# Patient Record
Sex: Female | Born: 1960 | Race: White | Hispanic: No | Marital: Single | Smoking: Never smoker
Health system: Southern US, Community
[De-identification: ages and names within clinical notes are randomized; demographics above are authoritative.]

## PROBLEM LIST (undated history)

## (undated) DIAGNOSIS — D219 Benign neoplasm of connective and other soft tissue, unspecified: Secondary | ICD-10-CM

## (undated) DIAGNOSIS — E785 Hyperlipidemia, unspecified: Secondary | ICD-10-CM

## (undated) DIAGNOSIS — M7989 Other specified soft tissue disorders: Secondary | ICD-10-CM

## (undated) DIAGNOSIS — F329 Major depressive disorder, single episode, unspecified: Secondary | ICD-10-CM

## (undated) DIAGNOSIS — C569 Malignant neoplasm of unspecified ovary: Secondary | ICD-10-CM

## (undated) DIAGNOSIS — M549 Dorsalgia, unspecified: Secondary | ICD-10-CM

## (undated) DIAGNOSIS — E559 Vitamin D deficiency, unspecified: Secondary | ICD-10-CM

## (undated) DIAGNOSIS — I10 Essential (primary) hypertension: Secondary | ICD-10-CM

## (undated) DIAGNOSIS — K5900 Constipation, unspecified: Secondary | ICD-10-CM

## (undated) DIAGNOSIS — G43909 Migraine, unspecified, not intractable, without status migrainosus: Secondary | ICD-10-CM

## (undated) DIAGNOSIS — Z9889 Other specified postprocedural states: Secondary | ICD-10-CM

## (undated) DIAGNOSIS — R112 Nausea with vomiting, unspecified: Secondary | ICD-10-CM

## (undated) DIAGNOSIS — F419 Anxiety disorder, unspecified: Secondary | ICD-10-CM

## (undated) DIAGNOSIS — R0602 Shortness of breath: Secondary | ICD-10-CM

## (undated) DIAGNOSIS — Z800 Family history of malignant neoplasm of digestive organs: Secondary | ICD-10-CM

## (undated) DIAGNOSIS — M1990 Unspecified osteoarthritis, unspecified site: Secondary | ICD-10-CM

## (undated) DIAGNOSIS — Z8042 Family history of malignant neoplasm of prostate: Secondary | ICD-10-CM

## (undated) DIAGNOSIS — M2550 Pain in unspecified joint: Secondary | ICD-10-CM

## (undated) DIAGNOSIS — D649 Anemia, unspecified: Secondary | ICD-10-CM

## (undated) DIAGNOSIS — R1900 Intra-abdominal and pelvic swelling, mass and lump, unspecified site: Secondary | ICD-10-CM

## (undated) DIAGNOSIS — N63 Unspecified lump in unspecified breast: Secondary | ICD-10-CM

## (undated) DIAGNOSIS — F32A Depression, unspecified: Secondary | ICD-10-CM

## (undated) DIAGNOSIS — G4730 Sleep apnea, unspecified: Secondary | ICD-10-CM

## (undated) DIAGNOSIS — T7840XA Allergy, unspecified, initial encounter: Secondary | ICD-10-CM

## (undated) HISTORY — PX: BREAST EXCISIONAL BIOPSY: SUR124

## (undated) HISTORY — DX: Shortness of breath: R06.02

## (undated) HISTORY — DX: Hyperlipidemia, unspecified: E78.5

## (undated) HISTORY — PX: WISDOM TOOTH EXTRACTION: SHX21

## (undated) HISTORY — DX: Other specified soft tissue disorders: M79.89

## (undated) HISTORY — PX: BREAST REDUCTION SURGERY: SHX8

## (undated) HISTORY — DX: Constipation, unspecified: K59.00

## (undated) HISTORY — DX: Allergy, unspecified, initial encounter: T78.40XA

## (undated) HISTORY — PX: REDUCTION MAMMAPLASTY: SUR839

## (undated) HISTORY — DX: Dorsalgia, unspecified: M54.9

## (undated) HISTORY — DX: Malignant neoplasm of unspecified ovary: C56.9

## (undated) HISTORY — DX: Essential (primary) hypertension: I10

## (undated) HISTORY — PX: BREAST LUMPECTOMY: SHX2

## (undated) HISTORY — DX: Family history of malignant neoplasm of prostate: Z80.42

## (undated) HISTORY — DX: Benign neoplasm of connective and other soft tissue, unspecified: D21.9

## (undated) HISTORY — DX: Unspecified osteoarthritis, unspecified site: M19.90

## (undated) HISTORY — DX: Anxiety disorder, unspecified: F41.9

## (undated) HISTORY — DX: Depression, unspecified: F32.A

## (undated) HISTORY — PX: KNEE SURGERY: SHX244

## (undated) HISTORY — DX: Unspecified lump in unspecified breast: N63.0

## (undated) HISTORY — DX: Vitamin D deficiency, unspecified: E55.9

## (undated) HISTORY — DX: Family history of malignant neoplasm of digestive organs: Z80.0

## (undated) HISTORY — DX: Migraine, unspecified, not intractable, without status migrainosus: G43.909

## (undated) HISTORY — DX: Intra-abdominal and pelvic swelling, mass and lump, unspecified site: R19.00

## (undated) HISTORY — DX: Pain in unspecified joint: M25.50

---
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
## 1999-04-20 ENCOUNTER — Encounter: Payer: Self-pay | Admitting: General Surgery

## 1999-04-20 ENCOUNTER — Ambulatory Visit (HOSPITAL_COMMUNITY): Admission: RE | Admit: 1999-04-20 | Discharge: 1999-04-20 | Payer: Self-pay | Admitting: General Surgery

## 2007-10-27 ENCOUNTER — Encounter: Admission: RE | Admit: 2007-10-27 | Discharge: 2007-10-27 | Payer: Self-pay | Admitting: Obstetrics and Gynecology

## 2007-11-16 ENCOUNTER — Encounter: Admission: RE | Admit: 2007-11-16 | Discharge: 2007-11-16 | Payer: Self-pay | Admitting: Obstetrics and Gynecology

## 2007-11-16 ENCOUNTER — Encounter (INDEPENDENT_AMBULATORY_CARE_PROVIDER_SITE_OTHER): Payer: Self-pay | Admitting: Diagnostic Radiology

## 2007-12-10 HISTORY — PX: ABDOMINAL HYSTERECTOMY: SHX81

## 2008-06-01 ENCOUNTER — Encounter: Admission: RE | Admit: 2008-06-01 | Discharge: 2008-06-01 | Payer: Self-pay | Admitting: Obstetrics and Gynecology

## 2008-11-24 ENCOUNTER — Encounter: Admission: RE | Admit: 2008-11-24 | Discharge: 2008-11-24 | Payer: Self-pay | Admitting: Obstetrics and Gynecology

## 2014-12-16 HISTORY — PX: BREAST BIOPSY: SHX20

## 2015-01-12 ENCOUNTER — Other Ambulatory Visit: Payer: Self-pay

## 2015-01-12 ENCOUNTER — Other Ambulatory Visit: Payer: Self-pay | Admitting: Gynecology

## 2015-01-12 DIAGNOSIS — N63 Unspecified lump in unspecified breast: Secondary | ICD-10-CM

## 2015-01-30 ENCOUNTER — Ambulatory Visit
Admission: RE | Admit: 2015-01-30 | Discharge: 2015-01-30 | Disposition: A | Discharge status: Home / Self Care | Payer: BLUE CROSS/BLUE SHIELD | Source: Ambulatory Visit | Attending: Gynecology | Admitting: Gynecology

## 2015-01-30 ENCOUNTER — Other Ambulatory Visit: Payer: Self-pay | Admitting: Gynecology

## 2015-01-30 DIAGNOSIS — N63 Unspecified lump in unspecified breast: Secondary | ICD-10-CM

## 2015-01-30 DIAGNOSIS — N6310 Unspecified lump in the right breast, unspecified quadrant: Secondary | ICD-10-CM

## 2015-02-02 ENCOUNTER — Other Ambulatory Visit: Payer: Self-pay | Admitting: Gynecology

## 2015-02-02 DIAGNOSIS — N6310 Unspecified lump in the right breast, unspecified quadrant: Secondary | ICD-10-CM

## 2015-02-03 ENCOUNTER — Ambulatory Visit
Admission: RE | Admit: 2015-02-03 | Discharge: 2015-02-03 | Disposition: A | Discharge status: Home / Self Care | Payer: BLUE CROSS/BLUE SHIELD | Source: Ambulatory Visit | Attending: Gynecology | Admitting: Gynecology

## 2015-02-03 DIAGNOSIS — N6310 Unspecified lump in the right breast, unspecified quadrant: Secondary | ICD-10-CM

## 2015-03-31 ENCOUNTER — Encounter: Payer: Self-pay | Admitting: Gynecology

## 2016-08-26 ENCOUNTER — Other Ambulatory Visit: Payer: Self-pay | Admitting: Internal Medicine

## 2016-08-26 DIAGNOSIS — Z1231 Encounter for screening mammogram for malignant neoplasm of breast: Secondary | ICD-10-CM

## 2016-10-01 ENCOUNTER — Encounter: Payer: Self-pay | Admitting: Internal Medicine

## 2016-12-13 ENCOUNTER — Ambulatory Visit
Admission: RE | Admit: 2016-12-13 | Discharge: 2016-12-13 | Disposition: A | Discharge status: Home / Self Care | Payer: BLUE CROSS/BLUE SHIELD | Source: Ambulatory Visit | Attending: Internal Medicine | Admitting: Internal Medicine

## 2016-12-13 DIAGNOSIS — Z1231 Encounter for screening mammogram for malignant neoplasm of breast: Secondary | ICD-10-CM

## 2017-08-20 ENCOUNTER — Telehealth: Payer: Self-pay | Admitting: Gynecologic Oncology

## 2017-08-20 ENCOUNTER — Encounter: Payer: Self-pay | Admitting: Gynecologic Oncology

## 2017-08-20 NOTE — Telephone Encounter (Signed)
Received a call from [facility] to schedule the pt an appt to see Dr. Alycia Rossetti on 9/19 at [facility] will notify the pt. Letter mailed.

## 2017-08-27 ENCOUNTER — Encounter: Payer: Self-pay | Admitting: Gynecologic Oncology

## 2017-08-27 ENCOUNTER — Ambulatory Visit (HOSPITAL_BASED_OUTPATIENT_CLINIC_OR_DEPARTMENT_OTHER): Payer: BLUE CROSS/BLUE SHIELD

## 2017-08-27 ENCOUNTER — Other Ambulatory Visit: Payer: Self-pay | Admitting: Gynecologic Oncology

## 2017-08-27 ENCOUNTER — Ambulatory Visit: Payer: BLUE CROSS/BLUE SHIELD | Attending: Gynecologic Oncology | Admitting: Gynecologic Oncology

## 2017-08-27 VITALS — BP 139/88 | HR 98 | Temp 98.4°F | Resp 20 | Ht 64.0 in | Wt 187.3 lb

## 2017-08-27 DIAGNOSIS — Z800 Family history of malignant neoplasm of digestive organs: Secondary | ICD-10-CM | POA: Diagnosis not present

## 2017-08-27 DIAGNOSIS — Z8249 Family history of ischemic heart disease and other diseases of the circulatory system: Secondary | ICD-10-CM | POA: Diagnosis not present

## 2017-08-27 DIAGNOSIS — Z9889 Other specified postprocedural states: Secondary | ICD-10-CM | POA: Insufficient documentation

## 2017-08-27 DIAGNOSIS — R1900 Intra-abdominal and pelvic swelling, mass and lump, unspecified site: Secondary | ICD-10-CM

## 2017-08-27 DIAGNOSIS — Z833 Family history of diabetes mellitus: Secondary | ICD-10-CM | POA: Insufficient documentation

## 2017-08-27 DIAGNOSIS — Z79899 Other long term (current) drug therapy: Secondary | ICD-10-CM | POA: Insufficient documentation

## 2017-08-27 DIAGNOSIS — I10 Essential (primary) hypertension: Secondary | ICD-10-CM | POA: Insufficient documentation

## 2017-08-27 DIAGNOSIS — Z90710 Acquired absence of both cervix and uterus: Secondary | ICD-10-CM | POA: Insufficient documentation

## 2017-08-27 DIAGNOSIS — Z803 Family history of malignant neoplasm of breast: Secondary | ICD-10-CM | POA: Insufficient documentation

## 2017-08-27 DIAGNOSIS — N839 Noninflammatory disorder of ovary, fallopian tube and broad ligament, unspecified: Secondary | ICD-10-CM | POA: Diagnosis not present

## 2017-08-27 LAB — CBC WITH DIFFERENTIAL/PLATELET
BASO%: 0.8 % (ref 0.0–2.0)
Basophils Absolute: 0.1 10*3/uL (ref 0.0–0.1)
EOS%: 2.4 % (ref 0.0–7.0)
Eosinophils Absolute: 0.3 10*3/uL (ref 0.0–0.5)
HEMATOCRIT: 40.4 % (ref 34.8–46.6)
HEMOGLOBIN: 13.2 g/dL (ref 11.6–15.9)
LYMPH#: 1.8 10*3/uL (ref 0.9–3.3)
LYMPH%: 15.2 % (ref 14.0–49.7)
MCH: 27.2 pg (ref 25.1–34.0)
MCHC: 32.7 g/dL (ref 31.5–36.0)
MCV: 83.1 fL (ref 79.5–101.0)
MONO#: 0.8 10*3/uL (ref 0.1–0.9)
MONO%: 7 % (ref 0.0–14.0)
NEUT%: 74.6 % (ref 38.4–76.8)
NEUTROS ABS: 8.9 10*3/uL — AB (ref 1.5–6.5)
Platelets: 637 10*3/uL — ABNORMAL HIGH (ref 145–400)
RBC: 4.86 10*6/uL (ref 3.70–5.45)
RDW: 15.4 % — AB (ref 11.2–14.5)
WBC: 11.9 10*3/uL — AB (ref 3.9–10.3)

## 2017-08-27 LAB — COMPREHENSIVE METABOLIC PANEL
ALBUMIN: 4.2 g/dL (ref 3.5–5.0)
ALT: 8 U/L (ref 0–55)
AST: 15 U/L (ref 5–34)
Alkaline Phosphatase: 64 U/L (ref 40–150)
Anion Gap: 12 mEq/L — ABNORMAL HIGH (ref 3–11)
BUN: 10.8 mg/dL (ref 7.0–26.0)
CALCIUM: 11.3 mg/dL — AB (ref 8.4–10.4)
CHLORIDE: 98 meq/L (ref 98–109)
CO2: 29 mEq/L (ref 22–29)
Creatinine: 0.9 mg/dL (ref 0.6–1.1)
EGFR: 73 mL/min/{1.73_m2} — AB (ref >90)
Glucose: 109 mg/dl (ref 70–140)
POTASSIUM: 3.5 meq/L (ref 3.5–5.1)
SODIUM: 139 meq/L (ref 136–145)
Total Bilirubin: 0.36 mg/dL (ref 0.20–1.20)
Total Protein: 8.8 g/dL — ABNORMAL HIGH (ref 6.4–8.3)

## 2017-08-27 LAB — CEA (IN HOUSE-CHCC)

## 2017-08-27 NOTE — Progress Notes (Signed)
Consult Note: Gyn-Onc  Sierra Gray 56 y.o. female  CC:  Chief Complaint  Patient presents with  . Pelvic mass R19.00]    New patient    HPI: Patient is seen today in consultation at the request of Dr. Nicoletta Dress.  Patient is a very pleasant 56 year old gravida 1 para 0 who in late August after having her [facility] visit set up from the dental care and felt some abdominal discomfort. The pain increased and she subsequently that evening had a temperature of 102. This was corresponding with increased abdominal pain and then the abdominal pain dissipated. Since then she's had a occasional pain and last Monday. Due to the persistent discomfort she saw her primary care physician. She describes the pain as a occurring in both her right and left lower quadrants. She did not notice any upper abdominal discomfort. There's been no change in her bowel or bladder habits. She does endorse approximately a 20-25 pound weight loss in the last 6-9 months. Much of this has been intentional. An Thanksgiving of last year to the Christmas holiday she lost 10 pounds which was not intentional but since then her weight loss has been intentional and she feels that she's eating well.  She underwent a CT scan on 08/19/2017. It revealed a trace amount of ascites in the peritoneal cavity seen prior dominant weight in the lateral paracolic gutter and along the liver capsule. There was no nodule letter peritoneal enhancement identified to suggest peritoneal carcinomatosis. There really does state that there is a mass that is cystic and solid that replaces the entire uterus. It extends caudally to the cervix and appears to invade the upper one third of the vagina. The caudal margin of the neoplasm made directly invading the posterior bladder dome to the medial thickness is normal. No fat separate the mass from the adjacent upper third of the rectum or from the mid sigmoid. The mass measures 12 x 13 x 20 cm in size and posteriorly  and laterally displaces the right ovary. The left ovary was not competent to be identified. Of note the patient had undergone a laparoscopic hysterectomy at [facility] approximate 14 years ago secondary to fibroids.  She has not yet had tumor markers drawn. She states she's never had a colonoscopy. She's not sure if her last mammogram was in 2017 but she thinks she might be due this year. She does have family history of cancer her father died of pancreatic cancer in his late 22s. His maternal aunt with breast cancer in her 61s. A paternal grandfather died of colon cancer at 55. She does endorse allergies to chlorhexidine which causes a rash. This was an issue when she had knee surgery on the right. She believes Betadine is okay.  Review of Systems: Constitutional:  Denies fever. + weight loss as above Skin: No rash, + skin tag on right inner leg she wants removed Cardiovascular: No chest pain, shortness of breath, or edema  Pulmonary: No cough or wheeze.  Gastro Intestinal: Reporting intermittent lower abdominal soreness.  No nausea, vomiting, constipation, or diarrhea reported. No bright red blood per rectum or change in bowel movement.  Genitourinary: Denies vaginal bleeding and discharge.  Musculoskeletal: + right joint pain.  Psychology: No complaints  Current Meds:  Outpatient Encounter Prescriptions as of 08/27/2017  Medication Sig  . amLODipine (NORVASC) 5 MG tablet amlodipine 5 mg tablet  TAKE 1 TAB BY MOUTH ONCE DAILY  . Apple Cider Vinegar 500 MG TABS Take 500 mg by  mouth 2 (two) times daily.  . Ascorbic Acid (VITAMIN C) 1000 MG tablet Take 1,000 mg by mouth daily.  . chlorthalidone (HYGROTON) 25 MG tablet   . loratadine (CLARITIN) 10 MG tablet Take 10 mg by mouth daily.  . Melatonin 1 MG CAPS Take 1 mg by mouth at bedtime.  . Methylsulfonylmethane (MSM) 1000 MG CAPS Take 1,000 mg by mouth daily.  . Turmeric 500 MG CAPS Take 500 mg by mouth daily.   No facility-administered encounter  medications on file as of 08/27/2017.     Allergy:  Allergies  Allergen Reactions  . Chlorhexidine Rash  . Bacitracin Rash  . Iodine Rash    Social Hx:   Social History   Social History  . Marital status: Single    Spouse name: N/A  . Number of children: N/A  . Years of education: N/A   Occupational History  . Not on file.   Social History Main Topics  . Smoking status: Never Smoker  . Smokeless tobacco: Never Used  . Alcohol use Yes     Comment: Socially drinks wine/beer  . Drug use: No  . Sexual activity: No   Other Topics Concern  . Not on file   Social History Narrative  . No narrative on file    Past Surgical Hx:  Past Surgical History:  Procedure Laterality Date  . ABDOMINAL HYSTERECTOMY  2009   Total laparoscopic hyst for fibroids  . BREAST BIOPSY  12/16/2014   2 lumps in left breast removed  . KNEE SURGERY     x3  . REDUCTION MAMMAPLASTY      Past Medical Hx:  Past Medical History:  Diagnosis Date  . Breast lump in female   . Fibroids   . Hyperlipidemia   . Hypertension   . Migraine   . Pelvic mass in female     Oncology Hx:   No history exists.    Family Hx:  Family History  Problem Relation Age of Onset  . Hypertension Mother   . Hyperlipidemia Mother   . Pancreatic cancer Father   . Hypertension Father   . Cancer Father 42       pancreatic  . Breast cancer Maternal Aunt   . Heart disease Maternal Aunt   . Cancer Maternal Aunt 79       breast  . Hyperlipidemia Maternal Uncle   . Diabetes Maternal Uncle   . Stroke Maternal Uncle   . Osteoporosis Maternal Uncle   . Heart attack Maternal Grandmother   . Diabetes Maternal Grandfather   . Dementia Maternal Grandfather   . Osteoporosis Paternal Grandmother   . Cancer Paternal Grandmother   . Colon cancer Paternal Grandfather     Vitals:  Blood pressure 139/88, pulse 98, temperature 98.4 F (36.9 C), temperature source Oral, resp. rate 20, height 5\' 4"  (1.626 m), weight 187  lb 4.8 oz (85 kg), SpO2 100 %.  Physical Exam: Well-nourished well-developed female in no acute distress.  Neck: Supple, no lymphadenopathy, no thyromegaly.  Lungs: Clear to auscultation bilaterally.  Cardiac: Regular rate and rhythm.  Abdomen: Obese, soft, nontender, nondistended. There is a palpable mass appreciated just under the umbilicus. There is no fluid wave. The mass is not tender. The patient was able to feel the mass when we identified the edge of the mass for her.  Groins: No lymphadenopathy.  Extremities no edema.  Pelvic: External genitalia is within normal limits. There is approximately an 8 mm skin tag coming  off the interval right thigh. Bimanual examination reveals a large abdominal pelvic mass measuring approximately 16-18 cm. It is nontender. It is somewhat mobile with the vaginal hand. It is not ballotable. On rectovaginal examination there is no nodularity.  Assessment/Plan: 56 year old with complex adnexal mass. We will resubmit her CT scan for reread here. 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   I cannot clearly identify which ovary is coming from but we discussed that we would proceed with exploratory laparotomy and removal of bilateral adnexa. The affected adnexa will be sent for frozen section. If this is a malignancy, we would proceed with appropriate staging to include but not limited to lymphadenectomy, peritoneal biopsies, omentectomy, and debulking surgery. Risks and benefits of surgery including but not limited to bleeding, infection, injury to surrounding organs, possible bowel surgery were discussed with the patient. We discussed the risk of DVT PE and that she most likely will be going home with Lovenox injections for 28 days. She states that she develops a rash to chlorhexidine therefore we will not use  that. She states that she believes Betadine is okay. Therefore, that will be the prep of choice for her procedure. She also requests that the skin tag be removed from her right thigh.  We will send a CA-125 and CEA today. As we were drawing blood, we also sent a CBC and chemistries. She is tentatively scheduled for surgery for October 4 with Dr. Everitt Amber. She understands that she'll the opportunity to meet Dr. Denman George the morning of surgery.  Her questions as well as those of her mother's were elicited in answer to their satisfaction.  We appreciate the opportunity to partner in the care of this very pleasant patient.  Santanna Whitford A., MD 08/27/2017, 10:22 AM

## 2017-08-27 NOTE — Patient Instructions (Signed)
Preparing for your Surgery  Plan for surgery on September 11, 2017 with Dr. Everitt Amber at [facility] will be scheduled for an exploratory laparotomy, bilateral salpingo-oophorectomy, possible staging.    Pre-operative Testing -You will receive a phone call from presurgical testing at [facility] to arrange for a pre-operative testing appointment before your surgery.  This appointment normally occurs one to two weeks before your scheduled surgery.   -Bring your insurance card, copy of an advanced directive if applicable, medication list  -At that visit, you will be asked to sign a consent for a possible blood transfusion in case a transfusion becomes necessary during surgery.  The need for a blood transfusion is rare but having consent is a necessary part of your care.     -You should not be taking blood thinners or aspirin at least ten days prior to surgery unless instructed by your surgeon.  Day Before Surgery at [facility] will be asked to take in a light diet the day before surgery.  Avoid carbonated beverages.  You will be advised to have nothing to eat or drink after midnight the evening before.    Eat a light diet the day before surgery.  Examples including soups, broths, toast, yogurt, mashed potatoes.  Things to avoid include carbonated beverages (fizzy beverages), raw fruits and raw vegetables, or beans.   If your bowels are filled with gas, your surgeon will have difficulty visualizing your pelvic organs which increases your surgical risks.  Starting at 4pm the day before surgery, begin drinking two bottles of magnesium citrate and take in only clear liquids up until midnight.  Your role in recovery Your role is to become active as soon as directed by your doctor, while still giving yourself time to heal.  Rest when you feel tired. You will be asked to do the following in order to speed your recovery:  - Cough and breathe deeply. This helps  toclear and expand your lungs and can prevent pneumonia. You may be given a spirometer to practice deep breathing. A staff member will show you how to use the spirometer. - Do mild physical activity. Walking or moving your legs help your circulation and body functions return to normal. A staff member will help you when you try to walk and will provide you with simple exercises. Do not try to get up or walk alone the first time. - Actively manage your pain. Managing your pain lets you move in comfort. We will ask you to rate your pain on a scale of zero to 10. It is your responsibility to tell your doctor or nurse where and how much you hurt so your pain can be treated.  Special Considerations -If you are diabetic, you may be placed on insulin after surgery to have closer control over your blood sugars to promote healing and recovery.  This does not mean that you will be discharged on insulin.  If applicable, your oral antidiabetics will be resumed when you are tolerating a solid diet.  -Your final pathology results from surgery should be available by the Friday after surgery and the results will be relayed to you when available.  -Dr. Lahoma Crocker is the Surgeon that assists your GYN Oncologist with surgery.  The next day after your surgery you will either see your GYN Oncologist or Dr. Lahoma Crocker.   Blood Transfusion Information WHAT IS A BLOOD TRANSFUSION? A transfusion is the replacement of blood or some of its parts.  Blood is made up of multiple cells which provide different functions.  Red blood cells carry oxygen and are used for blood loss replacement.  White blood cells fight against infection.  Platelets control bleeding.  Plasma helps clot blood.  Other blood products are available for specialized needs, such as hemophilia or other clotting disorders. BEFORE THE TRANSFUSION  Who gives blood for transfusions?   You may be able to donate blood to be used at a later  date on yourself (autologous donation).  Relatives can be asked to donate blood. This is generally not any safer than if you have received blood from a stranger. The same precautions are taken to ensure safety when a relative's blood is donated.  Healthy volunteers who are fully evaluated to make sure their blood is safe. This is blood bank blood. Transfusion therapy is the safest it has ever been in the practice of medicine. Before blood is taken from a donor, a complete history is taken to make sure that person has no history of diseases nor engages in risky social behavior (examples are intravenous drug use or sexual activity with multiple partners). The donor's travel history is screened to minimize risk of transmitting infections, such as malaria. The donated blood is tested for signs of infectious diseases, such as HIV and hepatitis. The blood is then tested to be sure it is compatible with you in order to minimize the chance of a transfusion reaction. If you or a relative donates blood, this is often done in anticipation of surgery and is not appropriate for emergency situations. It takes many days to process the donated blood. RISKS AND COMPLICATIONS Although transfusion therapy is very safe and saves many lives, the main dangers of transfusion include:   Getting an infectious disease.  Developing a transfusion reaction. This is an allergic reaction to something in the blood you were given. Every precaution is taken to prevent this. The decision to have a blood transfusion has been considered carefully by your caregiver before blood is given. Blood is not given unless the benefits outweigh the risks.

## 2017-08-28 LAB — CA 125: CANCER ANTIGEN (CA) 125: 904.5 U/mL — AB (ref 0.0–38.1)

## 2017-08-29 ENCOUNTER — Inpatient Hospital Stay
Admission: RE | Admit: 2017-08-29 | Discharge: 2017-08-29 | Disposition: A | Discharge status: Home / Self Care | Payer: Self-pay | Source: Ambulatory Visit | Attending: Gynecologic Oncology | Admitting: Gynecologic Oncology

## 2017-08-29 ENCOUNTER — Other Ambulatory Visit: Payer: Self-pay | Admitting: Gynecologic Oncology

## 2017-08-29 DIAGNOSIS — C801 Malignant (primary) neoplasm, unspecified: Secondary | ICD-10-CM

## 2017-09-03 NOTE — Patient Instructions (Addendum)
Sierra Gray  09/03/2017   Your procedure is scheduled on: Thursday 09-11-17  Report to [place]  Entrance Take [facility]  elevators to 3rd floor to  [place] at  800 AM.   Call this number if you have problems the morning of surgery [phone]  Remember: ONLY 1 PERSON MAY GO WITH YOU TO SHORT STAY TO GET  READY MORNING OF Center.  Do not eat food :After Midnight.TUESDAY   NIGHT   Eat a light diet the day before surgery ON 09-10-17 Wednesday .  Examples including soups, broths, toast, yogurt, mashed potatoes.  Things to avoid include carbonated beverages (fizzy beverages), raw fruits and raw vegetables, or beans.    If your bowels are filled with gas, your surgeon will have difficulty visualizing your pelvic organs which increases your surgical risks.  bring cpap mask and tubing  START A CLEAR LIQUID DIET AT  400 PM ON Wednesday 09-10-17 AND DRINK 2 BOTTLES OF MAGNESIUM CITRATE AT 400 PM STAY ON CLEAR LIQUID DIET UNTIL MIDNIGHT Wednesday NIGHT, NOTHING BY MOUTH AFTER MIDNIGHT Wednesday NIGHT.  CLEAR LIQUID DIET   Foods Allowed                                                                     Foods Excluded  Coffee and tea, regular and decaf                             liquids that you cannot  Plain Jell-O in any flavor                                             see through such as: Fruit ices (not with fruit pulp)                                     milk, soups, orange juice  Iced Popsicles                                    All solid food                                Cranberry, grape and apple juices Sports drinks like Gatorade Lightly seasoned clear broth or consume(fat free) Sugar, honey syrup  Sample Menu Breakfast                                Lunch                                     Supper Cranberry juice  Beef broth                            Chicken broth Jell-O                                     Grape  juice                           Apple juice Coffee or tea                        Jell-O                                      Popsicle                                                Coffee or tea                        Coffee or tea   MEDICATIONS TO TAKE MORNING OF SURGERY WITH A SIP OF WATER: LORATADINE (CLARITIN) _____________________________________________________________________  [facility] - Preparing for Surgery USE A BAR OF GOLD DIAL SOAP Before surgery, you can play an important role.  Because skin is not sterile, your skin needs to be as free of germs as possible.  You can reduce the number of germs on your skin by washing with DIAL soap before surgery.  Do not shave (including legs and underarms) for at least 48 hours prior to the first DIAL SOAP shower.  You may shave your face/neck. Please follow these instructions carefully:  1.  Shower with DIAL SOAP  the night before surgery and the  morning of Surgery.  2.  If you choose to wash your hair, wash your hair first as usual with your  normal  shampoo.  3.  After you shampoo, rinse your hair and body thoroughly to remove the  shampoo.                           4.  Use DIAL SOAP as you would any other liquid soap.  You can apply chg directly  to the skin and wash                       Gently with a scrungie or clean washcloth.                       Wash face,  Genitals (private parts) with your normal soap.             6.  Wash thoroughly, paying special attention to the area where your surgery  will be performed.  7.  Thoroughly rinse your body with warm water from the neck down.  8.  DO NOT shower/wash with your normal soap after using and rinsing off  the DIAL SOAP.                9.  Pat yourself dry with a clean towel.  10.  Wear clean pajamas.            11.  Place clean sheets on your bed the night of your first shower and do not  sleep with pets. Day of Surgery : Do not apply any lotions/deodorants the morning of  surgery.  Please wear clean clothes to the hospital/surgery center.  FAILURE TO FOLLOW THESE INSTRUCTIONS MAY RESULT IN THE CANCELLATION OF YOUR SURGERY PATIENT SIGNATURE_________________________________  NURSE SIGNATURE__________________________________  ________________________________________________________________________   Adam Phenix  An incentive spirometer is a tool that can help keep your lungs clear and active. This tool measures how well you are filling your lungs with each breath. Taking long deep breaths may help reverse or decrease the chance of developing breathing (pulmonary) problems (especially infection) following:  A long period of time when you are unable to move or be active. BEFORE THE PROCEDURE   If the spirometer includes an indicator to show your best effort, your nurse or respiratory therapist will set it to a desired goal.  If possible, sit up straight or lean slightly forward. Try not to slouch.  Hold the incentive spirometer in an upright position. INSTRUCTIONS FOR USE  1. Sit on the edge of your bed if possible, or sit up as far as you can in bed or on a chair. 2. Hold the incentive spirometer in an upright position. 3. Breathe out normally. 4. Place the mouthpiece in your mouth and seal your lips tightly around it. 5. Breathe in slowly and as deeply as possible, raising the piston or the ball toward the top of the column. 6. Hold your breath for 3-5 seconds or for as long as possible. Allow the piston or ball to fall to the bottom of the column. 7. Remove the mouthpiece from your mouth and breathe out normally. 8. Rest for a few seconds and repeat Steps 1 through 7 at least 10 times every 1-2 hours when you are awake. Take your time and take a few normal breaths between deep breaths. 9. The spirometer may include an indicator to show your best effort. Use the indicator as a goal to work toward during each repetition. 10. After each set of 10  deep breaths, practice coughing to be sure your lungs are clear. If you have an incision (the cut made at the time of surgery), support your incision when coughing by placing a pillow or rolled up towels firmly against it. Once you are able to get out of bed, walk around indoors and cough well. You may stop using the incentive spirometer when instructed by your caregiver.  RISKS AND COMPLICATIONS  Take your time so you do not get dizzy or light-headed.  If you are in pain, you may need to take or ask for pain medication before doing incentive spirometry. It is harder to take a deep breath if you are having pain. AFTER USE  Rest and breathe slowly and easily.  It can be helpful to keep track of a log of your progress. Your caregiver can provide you with a simple table to help with this. If you are using the spirometer at home, follow these instructions: [place] IF:   You are having difficultly using the spirometer.  You have trouble using the spirometer as often as instructed.  Your pain medication is not giving enough relief while using the spirometer.  You develop fever of 100.5 F (38.1 C) or higher. SEEK IMMEDIATE MEDICAL CARE IF:   You cough up bloody  sputum that had not been present before.  You develop fever of 102 F (38.9 C) or greater.  You develop worsening pain at or near the incision site. MAKE SURE YOU:   Understand these instructions.  Will watch your condition.  Will get help right away if you are not doing well or get worse. Document Released: 04/07/2007 Document Revised: 02/17/2012 Document Reviewed: 06/08/2007 ExitCare Patient Information 2014 ExitCare, [place].   ________________________________________________________________________  WHAT IS A BLOOD TRANSFUSION? Blood Transfusion Information  A transfusion is the replacement of blood or some of its parts. Blood is made up of multiple cells which provide different functions.  Red blood cells  carry oxygen and are used for blood loss replacement.  White blood cells fight against infection.  Platelets control bleeding.  Plasma helps clot blood.  Other blood products are available for specialized needs, such as hemophilia or other clotting disorders. BEFORE THE TRANSFUSION  Who gives blood for transfusions?   Healthy volunteers who are fully evaluated to make sure their blood is safe. This is blood bank blood. Transfusion therapy is the safest it has ever been in the practice of medicine. Before blood is taken from a donor, a complete history is taken to make sure that person has no history of diseases nor engages in risky social behavior (examples are intravenous drug use or sexual activity with multiple partners). The donor's travel history is screened to minimize risk of transmitting infections, such as malaria. The donated blood is tested for signs of infectious diseases, such as HIV and hepatitis. The blood is then tested to be sure it is compatible with you in order to minimize the chance of a transfusion reaction. If you or a relative donates blood, this is often done in anticipation of surgery and is not appropriate for emergency situations. It takes many days to process the donated blood. RISKS AND COMPLICATIONS Although transfusion therapy is very safe and saves many lives, the main dangers of transfusion include:   Getting an infectious disease.  Developing a transfusion reaction. This is an allergic reaction to something in the blood you were given. Every precaution is taken to prevent this. The decision to have a blood transfusion has been considered carefully by your caregiver before blood is given. Blood is not given unless the benefits outweigh the risks. AFTER THE TRANSFUSION  Right after receiving a blood transfusion, you will usually feel much better and more energetic. This is especially true if your red blood cells have gotten low (anemic). The transfusion raises  the level of the red blood cells which carry oxygen, and this usually causes an energy increase.  The nurse administering the transfusion will monitor you carefully for complications. HOME CARE INSTRUCTIONS  No special instructions are needed after a transfusion. You may find your energy is better. Speak with your caregiver about any limitations on activity for underlying diseases you may have. SEEK MEDICAL CARE IF:   Your condition is not improving after your transfusion.  You develop redness or irritation at the intravenous (IV) site. SEEK IMMEDIATE MEDICAL CARE IF:  Any of the following symptoms occur over the next 12 hours:  Shaking chills.  You have a temperature by mouth above 102 F (38.9 C), not controlled by medicine.  Chest, back, or muscle pain.  People around you feel you are not acting correctly or are confused.  Shortness of breath or difficulty breathing.  Dizziness and fainting.  You get a rash or develop hives.  You have a  decrease in urine output.  Your urine turns a dark color or changes to pink, red, or brown. Any of the following symptoms occur over the next 10 days:  You have a temperature by mouth above 102 F (38.9 C), not controlled by medicine.  Shortness of breath.  Weakness after normal activity.  The white part of the eye turns yellow (jaundice).  You have a decrease in the amount of urine or are urinating less often.  Your urine turns a dark color or changes to pink, red, or brown. Document Released: 11/22/2000 Document Revised: 02/17/2012 Document Reviewed: 07/11/2008 ExitCare Patient Information 2014 ExitCare, [place].  _______________________________________________________________________  Take these medicines the morning of surgery with A SIP OF WATER:  DO NOT TAKE ANY DIABETIC MEDICATIONS DAY OF YOUR SURGERY                               You may not have any metal on your body including hair pins and              piercings  Do not  wear jewelry, make-up, lotions, powders or perfumes, deodorant             Do not wear nail polish.  Do not shave  48 hours prior to surgery.              Men may shave face and neck.   Do not bring valuables to the hospital. [place].  Contacts, dentures or bridgework may not be worn into surgery.  Leave suitcase in the car. After surgery it may be brought to your room.                 Please read over the following fact sheets you were given: _____________________________________________________________________

## 2017-09-03 NOTE — Progress Notes (Signed)
CMET 08-27-17 EPIC, CBC WITH DIF (INCREASED WBC) , CEA, CA 125 08-27-17 EPIC

## 2017-09-04 ENCOUNTER — Encounter (HOSPITAL_COMMUNITY): Payer: Self-pay

## 2017-09-04 ENCOUNTER — Encounter (HOSPITAL_COMMUNITY)
Admission: RE | Admit: 2017-09-04 | Discharge: 2017-09-04 | Disposition: A | Discharge status: Home / Self Care | Payer: BLUE CROSS/BLUE SHIELD | Source: Ambulatory Visit | Attending: Gynecologic Oncology | Admitting: Gynecologic Oncology

## 2017-09-04 DIAGNOSIS — I10 Essential (primary) hypertension: Secondary | ICD-10-CM | POA: Diagnosis not present

## 2017-09-04 DIAGNOSIS — Z01812 Encounter for preprocedural laboratory examination: Secondary | ICD-10-CM | POA: Insufficient documentation

## 2017-09-04 DIAGNOSIS — R1900 Intra-abdominal and pelvic swelling, mass and lump, unspecified site: Secondary | ICD-10-CM | POA: Insufficient documentation

## 2017-09-04 DIAGNOSIS — Z01810 Encounter for preprocedural cardiovascular examination: Secondary | ICD-10-CM | POA: Insufficient documentation

## 2017-09-04 HISTORY — DX: Other specified postprocedural states: Z98.890

## 2017-09-04 HISTORY — DX: Sleep apnea, unspecified: G47.30

## 2017-09-04 HISTORY — DX: Anemia, unspecified: D64.9

## 2017-09-04 HISTORY — DX: Nausea with vomiting, unspecified: R11.2

## 2017-09-04 HISTORY — DX: Unspecified osteoarthritis, unspecified site: M19.90

## 2017-09-04 LAB — CBC
HEMATOCRIT: 39.4 % (ref 36.0–46.0)
Hemoglobin: 13.1 g/dL (ref 12.0–15.0)
MCH: 27 pg (ref 26.0–34.0)
MCHC: 33.2 g/dL (ref 30.0–36.0)
MCV: 81.1 fL (ref 78.0–100.0)
PLATELETS: 547 10*3/uL — AB (ref 150–400)
RBC: 4.86 MIL/uL (ref 3.87–5.11)
RDW: 15.6 % — ABNORMAL HIGH (ref 11.5–15.5)
WBC: 11.5 10*3/uL — AB (ref 4.0–10.5)

## 2017-09-04 LAB — URINALYSIS, ROUTINE W REFLEX MICROSCOPIC
BILIRUBIN URINE: NEGATIVE
Glucose, UA: NEGATIVE mg/dL
Hgb urine dipstick: NEGATIVE
KETONES UR: NEGATIVE mg/dL
LEUKOCYTES UA: NEGATIVE
NITRITE: NEGATIVE
PROTEIN: NEGATIVE mg/dL
Specific Gravity, Urine: 1.004 — ABNORMAL LOW (ref 1.005–1.030)
pH: 6 (ref 5.0–8.0)

## 2017-09-04 LAB — ABO/RH: ABO/RH(D): O POS

## 2017-09-11 ENCOUNTER — Telehealth: Payer: Self-pay | Admitting: *Deleted

## 2017-09-11 ENCOUNTER — Inpatient Hospital Stay (HOSPITAL_COMMUNITY): Payer: BLUE CROSS/BLUE SHIELD | Admitting: Anesthesiology

## 2017-09-11 ENCOUNTER — Inpatient Hospital Stay (HOSPITAL_COMMUNITY)
Admission: RE | Admit: 2017-09-11 | Discharge: 2017-09-13 | DRG: 738 | Disposition: A | Discharge status: Home / Self Care | Payer: BLUE CROSS/BLUE SHIELD | Source: Ambulatory Visit | Attending: Gynecologic Oncology | Admitting: Gynecologic Oncology

## 2017-09-11 ENCOUNTER — Encounter (HOSPITAL_COMMUNITY): Payer: Self-pay | Admitting: Emergency Medicine

## 2017-09-11 ENCOUNTER — Encounter (HOSPITAL_COMMUNITY)
Admission: RE | Disposition: A | Discharge status: Home / Self Care | Payer: Self-pay | Source: Ambulatory Visit | Attending: Gynecologic Oncology

## 2017-09-11 DIAGNOSIS — C562 Malignant neoplasm of left ovary: Secondary | ICD-10-CM | POA: Diagnosis present

## 2017-09-11 DIAGNOSIS — Z79899 Other long term (current) drug therapy: Secondary | ICD-10-CM | POA: Diagnosis not present

## 2017-09-11 DIAGNOSIS — Z9889 Other specified postprocedural states: Secondary | ICD-10-CM

## 2017-09-11 DIAGNOSIS — Z90710 Acquired absence of both cervix and uterus: Secondary | ICD-10-CM

## 2017-09-11 DIAGNOSIS — I10 Essential (primary) hypertension: Secondary | ICD-10-CM | POA: Diagnosis present

## 2017-09-11 DIAGNOSIS — Z8349 Family history of other endocrine, nutritional and metabolic diseases: Secondary | ICD-10-CM | POA: Diagnosis not present

## 2017-09-11 DIAGNOSIS — Z8262 Family history of osteoporosis: Secondary | ICD-10-CM

## 2017-09-11 DIAGNOSIS — R1900 Intra-abdominal and pelvic swelling, mass and lump, unspecified site: Secondary | ICD-10-CM | POA: Diagnosis present

## 2017-09-11 DIAGNOSIS — Z888 Allergy status to other drugs, medicaments and biological substances status: Secondary | ICD-10-CM | POA: Diagnosis not present

## 2017-09-11 DIAGNOSIS — Z833 Family history of diabetes mellitus: Secondary | ICD-10-CM | POA: Diagnosis not present

## 2017-09-11 DIAGNOSIS — Z800 Family history of malignant neoplasm of digestive organs: Secondary | ICD-10-CM | POA: Diagnosis not present

## 2017-09-11 DIAGNOSIS — Z823 Family history of stroke: Secondary | ICD-10-CM

## 2017-09-11 DIAGNOSIS — Z8249 Family history of ischemic heart disease and other diseases of the circulatory system: Secondary | ICD-10-CM | POA: Diagnosis not present

## 2017-09-11 DIAGNOSIS — Z803 Family history of malignant neoplasm of breast: Secondary | ICD-10-CM

## 2017-09-11 HISTORY — PX: LAPAROTOMY WITH STAGING: SHX5938

## 2017-09-11 HISTORY — PX: EXCISION OF SKIN TAG: SHX6270

## 2017-09-11 HISTORY — PX: OMENTECTOMY: SHX5985

## 2017-09-11 HISTORY — PX: BILATERAL SALPINGECTOMY: SHX5743

## 2017-09-11 HISTORY — PX: LAPAROTOMY: SHX154

## 2017-09-11 LAB — TYPE AND SCREEN
ABO/RH(D): O POS
Antibody Screen: NEGATIVE

## 2017-09-11 SURGERY — LAPAROTOMY, EXPLORATORY
Anesthesia: General

## 2017-09-11 MED ORDER — HYDROMORPHONE HCL 1 MG/ML IJ SOLN: 0.5000 mg | INTRAMUSCULAR | Status: DC | PRN

## 2017-09-11 MED ORDER — SODIUM CHLORIDE 0.9 % IJ SOLN
INTRAMUSCULAR | Status: DC | PRN
  Administered 2017-09-11: 20 mL

## 2017-09-11 MED ORDER — SUGAMMADEX SODIUM 200 MG/2ML IV SOLN
INTRAVENOUS | Status: DC | PRN
  Administered 2017-09-11: 170 mg via INTRAVENOUS

## 2017-09-11 MED ORDER — ROCURONIUM BROMIDE 10 MG/ML (PF) SYRINGE
PREFILLED_SYRINGE | INTRAVENOUS | Status: DC | PRN
  Administered 2017-09-11: 10 mg via INTRAVENOUS
  Administered 2017-09-11: 20 mg via INTRAVENOUS
  Administered 2017-09-11: 10 mg via INTRAVENOUS
  Administered 2017-09-11: 50 mg via INTRAVENOUS
  Administered 2017-09-11 (×2): 10 mg via INTRAVENOUS

## 2017-09-11 MED ORDER — DEXAMETHASONE SODIUM PHOSPHATE 10 MG/ML IJ SOLN
INTRAMUSCULAR | Status: DC | PRN
  Administered 2017-09-11: 10 mg via INTRAVENOUS

## 2017-09-11 MED ORDER — KCL IN DEXTROSE-NACL 20-5-0.45 MEQ/L-%-% IV SOLN
INTRAVENOUS | Status: DC
Start: 2017-09-11 — End: 2017-09-13
  Administered 2017-09-11: 16:00:00 via INTRAVENOUS
  Administered 2017-09-12: 50 mL/h via INTRAVENOUS
  Filled 2017-09-11 (×3): qty 1000

## 2017-09-11 MED ORDER — NON FORMULARY
1.0000 [IU] | Freq: Three times a day (TID) | Status: DC
Start: 2017-09-11 — End: 2017-09-11

## 2017-09-11 MED ORDER — DEXTROSE 5 % IV SOLN
2.0000 g | INTRAVENOUS | Status: AC
Start: 2017-09-11 — End: 2017-09-11
  Administered 2017-09-11 (×2): 2 g via INTRAVENOUS
  Filled 2017-09-11: qty 2

## 2017-09-11 MED ORDER — MEPERIDINE HCL 50 MG/ML IJ SOLN: 6.2500 mg | INTRAMUSCULAR | Status: DC | PRN

## 2017-09-11 MED ORDER — EPHEDRINE 5 MG/ML INJ
INTRAVENOUS | Status: AC
  Filled 2017-09-11: qty 10

## 2017-09-11 MED ORDER — ROCURONIUM BROMIDE 50 MG/5ML IV SOSY
PREFILLED_SYRINGE | INTRAVENOUS | Status: AC
  Filled 2017-09-11: qty 5

## 2017-09-11 MED ORDER — SODIUM CHLORIDE 0.9 % IJ SOLN
INTRAMUSCULAR | Status: AC
  Filled 2017-09-11: qty 10

## 2017-09-11 MED ORDER — DEXTROSE 5 % IV SOLN
2.0000 g | Freq: Once | INTRAVENOUS | Status: DC
  Filled 2017-09-11: qty 2

## 2017-09-11 MED ORDER — PROPOFOL 10 MG/ML IV BOLUS
INTRAVENOUS | Status: DC | PRN
  Administered 2017-09-11: 150 mg via INTRAVENOUS

## 2017-09-11 MED ORDER — LIDOCAINE 2% (20 MG/ML) 5 ML SYRINGE
INTRAMUSCULAR | Status: DC | PRN
  Administered 2017-09-11: 100 mg via INTRAVENOUS

## 2017-09-11 MED ORDER — KETOROLAC TROMETHAMINE 30 MG/ML IJ SOLN: 15.0000 mg | Freq: Four times a day (QID) | INTRAMUSCULAR | Status: AC

## 2017-09-11 MED ORDER — EPHEDRINE SULFATE 50 MG/ML IJ SOLN
INTRAMUSCULAR | Status: DC | PRN
  Administered 2017-09-11 (×3): 10 mg via INTRAVENOUS

## 2017-09-11 MED ORDER — ONDANSETRON HCL 4 MG/2ML IJ SOLN: 4.0000 mg | Freq: Four times a day (QID) | INTRAMUSCULAR | Status: DC | PRN

## 2017-09-11 MED ORDER — ENOXAPARIN (LOVENOX) PATIENT EDUCATION KIT
PACK | Freq: Once | Status: DC
  Filled 2017-09-11: qty 1

## 2017-09-11 MED ORDER — SODIUM CHLORIDE 0.9 % IV SOLN
INTRAVENOUS | Status: DC | PRN
  Administered 2017-09-11 (×3): via INTRAVENOUS

## 2017-09-11 MED ORDER — MIDAZOLAM HCL 2 MG/2ML IJ SOLN
INTRAMUSCULAR | Status: DC | PRN
  Administered 2017-09-11: 2 mg via INTRAVENOUS

## 2017-09-11 MED ORDER — KETOROLAC TROMETHAMINE 30 MG/ML IJ SOLN
15.0000 mg | Freq: Four times a day (QID) | INTRAMUSCULAR | Status: AC
  Administered 2017-09-11 – 2017-09-12 (×4): 15 mg via INTRAVENOUS
  Filled 2017-09-11 (×4): qty 1

## 2017-09-11 MED ORDER — EQL ORBIT TOOTHBRUSH SOFT MISC
1.0000 | Freq: Three times a day (TID) | Status: DC
  Administered 2017-09-11 – 2017-09-12 (×4): 1 via ORAL
  Filled 2017-09-11 (×5): qty 1

## 2017-09-11 MED ORDER — ALBUMIN HUMAN 5 % IV SOLN
INTRAVENOUS | Status: AC
  Filled 2017-09-11: qty 250

## 2017-09-11 MED ORDER — PROPOFOL 10 MG/ML IV BOLUS
INTRAVENOUS | Status: AC
  Filled 2017-09-11: qty 20

## 2017-09-11 MED ORDER — DEXAMETHASONE SODIUM PHOSPHATE 10 MG/ML IJ SOLN
INTRAMUSCULAR | Status: AC
  Filled 2017-09-11: qty 1

## 2017-09-11 MED ORDER — SCOPOLAMINE 1 MG/3DAYS TD PT72
MEDICATED_PATCH | TRANSDERMAL | Status: DC | PRN
  Administered 2017-09-11: 1 via TRANSDERMAL

## 2017-09-11 MED ORDER — FENTANYL CITRATE (PF) 100 MCG/2ML IJ SOLN: 25.0000 ug | INTRAMUSCULAR | Status: DC | PRN

## 2017-09-11 MED ORDER — ONDANSETRON HCL 4 MG/2ML IJ SOLN: 4.0000 mg | Freq: Once | INTRAMUSCULAR | Status: DC | PRN

## 2017-09-11 MED ORDER — SCOPOLAMINE 1 MG/3DAYS TD PT72
MEDICATED_PATCH | TRANSDERMAL | Status: AC
  Filled 2017-09-11: qty 1

## 2017-09-11 MED ORDER — ENOXAPARIN SODIUM 40 MG/0.4ML ~~LOC~~ SOLN
40.0000 mg | SUBCUTANEOUS | Status: AC
  Administered 2017-09-11: 40 mg via SUBCUTANEOUS
  Filled 2017-09-11: qty 0.4

## 2017-09-11 MED ORDER — KETAMINE HCL-SODIUM CHLORIDE 100-0.9 MG/10ML-% IV SOSY
PREFILLED_SYRINGE | INTRAVENOUS | Status: AC
  Filled 2017-09-11: qty 10

## 2017-09-11 MED ORDER — ONDANSETRON HCL 4 MG/2ML IJ SOLN
INTRAMUSCULAR | Status: AC
  Filled 2017-09-11: qty 2

## 2017-09-11 MED ORDER — SENNOSIDES-DOCUSATE SODIUM 8.6-50 MG PO TABS
2.0000 | ORAL_TABLET | Freq: Every day | ORAL | Status: DC
  Administered 2017-09-11 – 2017-09-12 (×2): 2 via ORAL
  Filled 2017-09-11 (×2): qty 2

## 2017-09-11 MED ORDER — ALBUMIN HUMAN 5 % IV SOLN
INTRAVENOUS | Status: DC | PRN
  Administered 2017-09-11: 12:00:00 via INTRAVENOUS

## 2017-09-11 MED ORDER — LIDOCAINE 2% (20 MG/ML) 5 ML SYRINGE
INTRAMUSCULAR | Status: AC
  Filled 2017-09-11: qty 5

## 2017-09-11 MED ORDER — SUFENTANIL CITRATE 50 MCG/ML IV SOLN
INTRAVENOUS | Status: DC | PRN
  Administered 2017-09-11 (×3): 10 ug via INTRAVENOUS
  Administered 2017-09-11: 20 ug via INTRAVENOUS

## 2017-09-11 MED ORDER — AMLODIPINE BESYLATE 5 MG PO TABS
5.0000 mg | ORAL_TABLET | Freq: Every evening | ORAL | Status: DC
  Administered 2017-09-11 – 2017-09-12 (×2): 5 mg via ORAL
  Filled 2017-09-11 (×2): qty 1

## 2017-09-11 MED ORDER — LACTATED RINGERS IV SOLN
INTRAVENOUS | Status: DC
  Administered 2017-09-11: 09:00:00 via INTRAVENOUS

## 2017-09-11 MED ORDER — BUPIVACAINE HCL (PF) 0.25 % IJ SOLN
INTRAMUSCULAR | Status: AC
  Filled 2017-09-11: qty 30

## 2017-09-11 MED ORDER — BUPIVACAINE HCL 0.25 % IJ SOLN
INTRAMUSCULAR | Status: DC | PRN
  Administered 2017-09-11: 20 mL

## 2017-09-11 MED ORDER — HYDROMORPHONE HCL 2 MG/ML IJ SOLN
INTRAMUSCULAR | Status: AC
  Filled 2017-09-11: qty 1

## 2017-09-11 MED ORDER — ONDANSETRON HCL 4 MG PO TABS: 4.0000 mg | ORAL_TABLET | Freq: Four times a day (QID) | ORAL | Status: DC | PRN

## 2017-09-11 MED ORDER — IBUPROFEN 800 MG PO TABS
800.0000 mg | ORAL_TABLET | Freq: Four times a day (QID) | ORAL | Status: DC
  Administered 2017-09-12 – 2017-09-13 (×3): 800 mg via ORAL
  Filled 2017-09-11 (×3): qty 1

## 2017-09-11 MED ORDER — HYDROMORPHONE HCL 1 MG/ML IJ SOLN
INTRAMUSCULAR | Status: DC | PRN
  Administered 2017-09-11 (×5): .4 mg via INTRAVENOUS

## 2017-09-11 MED ORDER — PREGABALIN 75 MG PO CAPS
75.0000 mg | ORAL_CAPSULE | Freq: Two times a day (BID) | ORAL | Status: DC
  Administered 2017-09-12 – 2017-09-13 (×3): 75 mg via ORAL
  Filled 2017-09-11 (×3): qty 1

## 2017-09-11 MED ORDER — SUFENTANIL CITRATE 50 MCG/ML IV SOLN
INTRAVENOUS | Status: AC
Start: 2017-09-11 — End: 2017-09-11
  Filled 2017-09-11: qty 1

## 2017-09-11 MED ORDER — BUPIVACAINE LIPOSOME 1.3 % IJ SUSP
20.0000 mL | Freq: Once | INTRAMUSCULAR | Status: AC
  Administered 2017-09-11: 20 mL
  Filled 2017-09-11: qty 20

## 2017-09-11 MED ORDER — MIDAZOLAM HCL 2 MG/2ML IJ SOLN
INTRAMUSCULAR | Status: AC
  Filled 2017-09-11: qty 2

## 2017-09-11 MED ORDER — ONDANSETRON HCL 4 MG/2ML IJ SOLN
INTRAMUSCULAR | Status: DC | PRN
  Administered 2017-09-11: 4 mg via INTRAVENOUS

## 2017-09-11 MED ORDER — ACETAMINOPHEN 500 MG PO TABS
1000.0000 mg | ORAL_TABLET | Freq: Four times a day (QID) | ORAL | Status: DC
  Administered 2017-09-11 – 2017-09-13 (×6): 1000 mg via ORAL
  Filled 2017-09-11 (×7): qty 2

## 2017-09-11 MED ORDER — SUGAMMADEX SODIUM 200 MG/2ML IV SOLN
INTRAVENOUS | Status: AC
  Filled 2017-09-11: qty 2

## 2017-09-11 MED ORDER — KETAMINE HCL 10 MG/ML IJ SOLN
INTRAMUSCULAR | Status: DC | PRN
  Administered 2017-09-11 (×4): 10 mg via INTRAVENOUS
  Administered 2017-09-11: 30 mg via INTRAVENOUS
  Administered 2017-09-11 (×2): 10 mg via INTRAVENOUS

## 2017-09-11 MED ORDER — ENOXAPARIN SODIUM 40 MG/0.4ML ~~LOC~~ SOLN
40.0000 mg | SUBCUTANEOUS | Status: DC
  Administered 2017-09-12 – 2017-09-13 (×2): 40 mg via SUBCUTANEOUS
  Filled 2017-09-11 (×3): qty 0.4

## 2017-09-11 MED ORDER — OXYCODONE HCL 5 MG PO TABS: 5.0000 mg | ORAL_TABLET | ORAL | Status: DC | PRN

## 2017-09-11 SURGICAL SUPPLY — 64 items
ADH SKN CLS APL DERMABOND .7 (GAUZE/BANDAGES/DRESSINGS) ×2
AGENT HMST MTR 8 SURGIFLO (HEMOSTASIS)
ATTRACTOMAT 16X20 MAGNETIC DRP (DRAPES) ×1 IMPLANT
BLADE EXTENDED COATED 6.5IN (ELECTRODE) ×5 IMPLANT
CELLS DAT CNTRL 66122 CELL SVR (MISCELLANEOUS) IMPLANT
CHLORAPREP W/TINT 26ML (MISCELLANEOUS) ×2 IMPLANT
CLIP VESOCCLUDE LG 6/CT (CLIP) ×2 IMPLANT
CLIP VESOCCLUDE MED 6/CT (CLIP) ×4 IMPLANT
CLIP VESOCCLUDE MED LG 6/CT (CLIP) ×4 IMPLANT
CONT SPEC 4OZ CLIKSEAL STRL BL (MISCELLANEOUS) ×2 IMPLANT
DERMABOND ADVANCED (GAUZE/BANDAGES/DRESSINGS) ×1
DERMABOND ADVANCED .7 DNX12 (GAUZE/BANDAGES/DRESSINGS) IMPLANT
DRAPE INCISE 23X17 IOBAN STRL (DRAPES) ×1
DRAPE INCISE 23X17 STRL (DRAPES) IMPLANT
DRAPE INCISE IOBAN 23X17 STRL (DRAPES) ×2 IMPLANT
DRAPE INCISE IOBAN 66X45 STRL (DRAPES) IMPLANT
DRAPE WARM FLUID 44X44 (DRAPE) ×3 IMPLANT
DRSG OPSITE POSTOP 4X12 (GAUZE/BANDAGES/DRESSINGS) ×1 IMPLANT
ELECT PENCIL ROCKER SW 15FT (MISCELLANEOUS) ×2 IMPLANT
ELECT REM PT RETURN 15FT ADLT (MISCELLANEOUS) ×3 IMPLANT
GAUZE SPONGE 4X4 16PLY XRAY LF (GAUZE/BANDAGES/DRESSINGS) IMPLANT
GLOVE BIO SURGEON STRL SZ 6 (GLOVE) ×6 IMPLANT
GLOVE BIO SURGEON STRL SZ 6.5 (GLOVE) ×6 IMPLANT
GOWN STRL REUS W/ TWL LRG LVL3 (GOWN DISPOSABLE) ×4 IMPLANT
GOWN STRL REUS W/TWL LRG LVL3 (GOWN DISPOSABLE) ×6
HEMOSTAT ARISTA ABSORB 3G PWDR (MISCELLANEOUS) ×1 IMPLANT
KIT BASIN OR (CUSTOM PROCEDURE TRAY) ×3 IMPLANT
LIGASURE IMPACT 36 18CM CVD LR (INSTRUMENTS) ×1 IMPLANT
LOOP VESSEL MAXI BLUE (MISCELLANEOUS) IMPLANT
NEEDLE HYPO 22GX1.5 SAFETY (NEEDLE) ×6 IMPLANT
NS IRRIG 1000ML POUR BTL (IV SOLUTION) ×4 IMPLANT
PACK GENERAL/GYN (CUSTOM PROCEDURE TRAY) ×3 IMPLANT
RELOAD PROXIMATE 75MM BLUE (ENDOMECHANICALS) IMPLANT
RELOAD PROXIMATE TA60MM BLUE (ENDOMECHANICALS) IMPLANT
RELOAD STAPLE 60 BLU REG PROX (ENDOMECHANICALS) IMPLANT
RELOAD STAPLE 75 3.8 BLU REG (ENDOMECHANICALS) IMPLANT
RETRACTOR WND ALEXIS 18 MED (MISCELLANEOUS) IMPLANT
RETRACTOR WND ALEXIS 25 LRG (MISCELLANEOUS) IMPLANT
RTRCTR WOUND ALEXIS 18CM MED (MISCELLANEOUS)
RTRCTR WOUND ALEXIS 25CM LRG (MISCELLANEOUS)
SHEET LAVH (DRAPES) ×3 IMPLANT
SPOGE SURGIFLO 8M (HEMOSTASIS)
SPONGE LAP 18X18 X RAY DECT (DISPOSABLE) ×3 IMPLANT
SPONGE SURGIFLO 8M (HEMOSTASIS) IMPLANT
STAPLER GUN LINEAR PROX 60 (STAPLE) IMPLANT
STAPLER PROXIMATE 75MM BLUE (STAPLE) IMPLANT
STAPLER VISISTAT 35W (STAPLE) IMPLANT
SUT MNCRL AB 4-0 PS2 18 (SUTURE) ×2 IMPLANT
SUT PDS AB 1 TP1 96 (SUTURE) ×6 IMPLANT
SUT PROLENE 5 0 CC 1 (SUTURE) ×1 IMPLANT
SUT SILK 3 0 SH CR/8 (SUTURE) ×3 IMPLANT
SUT VIC AB 0 CT1 36 (SUTURE) ×8 IMPLANT
SUT VIC AB 2-0 CT1 36 (SUTURE) ×4 IMPLANT
SUT VIC AB 2-0 CT2 27 (SUTURE) ×17 IMPLANT
SUT VIC AB 3-0 CTX 36 (SUTURE) IMPLANT
SUT VIC AB 3-0 SH 18 (SUTURE) IMPLANT
SUT VIC AB 3-0 SH 27 (SUTURE) ×12
SUT VIC AB 3-0 SH 27X BRD (SUTURE) ×2 IMPLANT
SUT VIC AB 4-0 PS2 27 (SUTURE) ×1 IMPLANT
SYR 30ML LL (SYRINGE) ×6 IMPLANT
TOWEL OR 17X26 10 PK STRL BLUE (TOWEL DISPOSABLE) ×3 IMPLANT
TOWEL OR NON WOVEN STRL DISP B (DISPOSABLE) ×3 IMPLANT
TRAY FOLEY W/METER SILVER 16FR (SET/KITS/TRAYS/PACK) ×3 IMPLANT
UNDERPAD 30X30 (UNDERPADS AND DIAPERS) ×3 IMPLANT

## 2017-09-11 NOTE — Op Note (Signed)
OPERATIVE NOTE 10/4/`8   Preoperative Diagnosis: 1. left adnexal mass.  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   Procedure(s) Performed: 1. Exploratory laparotomy with bilateral salpingo-oophorectomy, pelvic and para-aortic lymph node dissection, omentectomy and radical debulking for ovarian cancer (CPT [id number])  Surgeon: Everitt Amber, M.D. Assistanr Surgeon: Lahoma Crocker, M.D. Assistant: (an MD assistant was necessary for tissue manipulation, retraction and positioning due to the complexity of the case and hospital policies).  Specimens: Bilateral tubes / ovaries, bilateral pelvic and para-aortic lymph nodes, peritoneal biopsies, and omentum.  Estimated Blood Loss: 200 mL. Blood Replacement: none.  IV Fluids: 3050mL.  Urine Output: 324MW  Complications: None.   Operative Findings:20 cm left ovarian mass.   No intraperitoneal rupture occurred;  Frozen pathology was consistent with adenocarcinoma; right tube and ovary normal in appearance; 3) normal bilateral pelvic and para-aortic lymph nodes and omentum; small and large bowel to palpation. This represented an optimal cytoreduction with no gross visible disease remaining.   Procedure: After informed consent was confirmed, the patient was taken to the operating room where general anesthesia was obtained without difficulty. She was prepped and draped in the normal sterile fashion in the dorsal lithotomy position in padded Allen stirrups with good attention paid to support of the lower back and lower extremities. Position was adjusted for appropriate support. A Foley catheter was placed to gravity.   A paramedian vertical midline incision was made from the pubic symphysis to the umbilicus. The abdominal cavity was entered sharply and without incident. A Bookwalter retractor was then placed. A survey of the abdomen and pelvis revealed the above findings, which were significant for a large a 20cm  left-sided retroperitoneal mass  The right tube and ovary were grossly normal in appearance. After packing the small bowel into the upper abdomen, we began the procedure by entering the left pelvic sidewall just posterior to the round ligament. The pararectal space was developed and the retroperitoneum developed up to the level of the common iliac artery. The course of the ureter was identified with ease. The left IP was then skeletonized, sealed and cut with ligasure. The remaining attachments to the vaginal cuff were then taken down sharply. It was apparent that the right tube and ovary were grossly normal in appearance and were sent for permanent pathology. Attention was then returned to the right ovary where the pararectal space was further developed, identifying the course of the ureter and internal and external iliac vasculature. We had difficulty isolating the IP at this time and it appeared that the infundibulopelvic ligament was herniating through the large bowel mesentery at this site. This was traced to the level of the ovary and clamped, cut and suture ligated away from the course of the ureter  The entire right tube and ovary were then sent to Pathology.   A staging procedure was then performed. We further developed the left and right paravesical and pararectal spaces and performed a left-sided pelvic lymph node dissection with the following borders: proximally the bifurcation of the common iliac, distally the circumflex iliac vein, laterally the genitofemoral nerve, the medial border was the superior vesicle artery and the deep border was the obturator nerve. All lymphatic tissue was removed and sent to Pathology. A similar procedure was performed on the contralateral side. Right and left pelvic peritoneal and anterior and posterior pelvic peritoneal biopsies were then performed with the Bovie. The right pelvic gutter was then re-explored after adjustment of the Bookwalter and the line of  [place]  incised, deviating the colon medially. A right para-aortic lymph node dissection was then performed with the following borders: distally the common iliac, medially the aorta and IVC, laterally the psoas and the proximal border was just inferior to the renal vasculature. All lymphatic tissue was removed and sent to Pathology. We performed a similar procedure on the left, however, we only went up to the level of the IMA. A lumbar vein was entered and made hemostatic with clips. An infragastric omentectomy was then performed after dissecting the omentum off of the transverse colon. This was taken down with a combination of Bovie cautery and the LigaSure. Palpation of the upper abdomen, including the liver, spleen, stomach, and pancreas, was normal.   Peritoneal biopsies of the pelvis and abdomen were collected.   The abdomen and pelvis were then copiously irrigated and all surgical sites found to be hemostatic. The fascia was reapproximated with 0 looped PDS using a total of three sutures. The subcutaneous layer was then irrigated with a liter of fluid and the deep layer closed with interrupted 2-0 Vicryl. The subcutaneous layer was reapproximated with interrupted 2-0 Vicryl. The subcutaneous layer was then reapproximated with a running 4-0 Monocryl. The patient tolerated the procedure well.   Sponge, lap and needle counts were correct x 3.    The patient had sequential compression devices for VTE prophylaxis and will receive Lovenox for 4 weeks postoperatively.   The patient was extubated and taken to the recovery room in stable condition.     Donaciano Eva, MD

## 2017-09-11 NOTE — Anesthesia Preprocedure Evaluation (Signed)
Anesthesia Evaluation  Patient identified by MRN, date of birth, ID band Patient awake    Reviewed: Allergy & Precautions, NPO status , Patient's Chart, lab work & pertinent test results  History of Anesthesia Complications (+) PONV and history of anesthetic complications  Airway Mallampati: II  TM Distance: >3 FB Neck ROM: Full    Dental no notable dental hx.    Pulmonary neg pulmonary ROS, sleep apnea ,    Pulmonary exam normal breath sounds clear to auscultation       Cardiovascular hypertension, negative cardio ROS Normal cardiovascular exam Rhythm:Regular Rate:Normal     Neuro/Psych  Headaches, negative neurological ROS  negative psych ROS   GI/Hepatic negative GI ROS, Neg liver ROS,   Endo/Other  negative endocrine ROS  Renal/GU negative Renal ROS  negative genitourinary   Musculoskeletal negative musculoskeletal ROS (+) Arthritis , Osteoarthritis,    Abdominal   Peds negative pediatric ROS (+)  Hematology negative hematology ROS (+) anemia ,   Anesthesia Other Findings   Reproductive/Obstetrics negative OB ROS                            Anesthesia Physical Anesthesia Plan  ASA: II  Anesthesia Plan: General   Post-op Pain Management:    Induction: Intravenous  PONV Risk Score and Plan: 3 and Ondansetron, Dexamethasone, Midazolam, Propofol infusion, Treatment may vary due to age or medical condition and Scopolamine patch - Pre-op  Airway Management Planned: Oral ETT  Additional Equipment:   Intra-op Plan:   Post-operative Plan: Extubation in OR  Informed Consent:   Plan Discussed with:   Anesthesia Plan Comments: (  )        Anesthesia Quick Evaluation

## 2017-09-11 NOTE — Anesthesia Postprocedure Evaluation (Signed)
Anesthesia Post Note  Patient: Sierra Gray  Procedure(s) Performed: EXPLORATORY LAPAROTOMY (Bilateral ) BILATERAL SALPINGECTOMY WITH OOPHERECTOMY (Bilateral ) EXCISION OF SKIN TAG ON THIGH (N/A ) PELVIC AND PARA AORTIC LYMPH NODE  DISSECTION (N/A ) OMENTECTOMY (N/A )     Patient location during evaluation: PACU Anesthesia Type: General Level of consciousness: awake and alert Pain management: pain level controlled Vital Signs Assessment: post-procedure vital signs reviewed and stable Respiratory status: spontaneous breathing, nonlabored ventilation, respiratory function stable and patient connected to nasal cannula oxygen Cardiovascular status: blood pressure returned to baseline and stable Postop Assessment: no apparent nausea or vomiting Anesthetic complications: no    Last Vitals:  Vitals:   09/11/17 0809 09/11/17 1342  BP: (!) 142/97 106/74  Pulse: (!) 102   Resp: 18 14  Temp: 37.3 C 36.8 C  SpO2: 97%     Last Pain:  Vitals:   09/11/17 1345  TempSrc:   PainSc: Asleep                 Jazlyn Tippens

## 2017-09-11 NOTE — Telephone Encounter (Signed)
Scheduled patient's post op appt. Patient to receive appt with discharge summary

## 2017-09-11 NOTE — Transfer of Care (Signed)
Immediate Anesthesia Transfer of Care Note  Patient: Sierra Gray  Procedure(s) Performed: EXPLORATORY LAPAROTOMY (Bilateral ) BILATERAL SALPINGECTOMY WITH OOPHERECTOMY (Bilateral ) EXCISION OF SKIN TAG ON THIGH (N/A ) PELVIC AND PARA AORTIC LYMPH NODE  DISSECTION (N/A ) OMENTECTOMY (N/A )  Patient Location: PACU  Anesthesia Type:General  Level of Consciousness: awake, alert  and oriented  Airway & Oxygen Therapy: Patient Spontanous Breathing and Patient connected to face mask oxygen  Post-op Assessment: Report given to RN and Post -op Vital signs reviewed and stable  Post vital signs: Reviewed and stable  Last Vitals:  Vitals:   09/11/17 0809  BP: (!) 142/97  Pulse: (!) 102  Resp: 18  Temp: 37.3 C  SpO2: 97%    Last Pain:  Vitals:   09/11/17 0833  TempSrc:   PainSc: 0-No pain      Patients Stated Pain Goal: 4 (53/[id number])  Complications: No apparent anesthesia complications

## 2017-09-11 NOTE — Anesthesia Procedure Notes (Signed)
Procedure Name: Intubation Date/Time: 09/11/2017 9:54 AM Performed by: Danley Danker L Patient Re-evaluated:Patient Re-evaluated prior to induction Oxygen Delivery Method: Circle system utilized Preoxygenation: Pre-oxygenation with 100% oxygen Induction Type: IV induction Ventilation: Mask ventilation without difficulty and Oral airway inserted - appropriate to patient size Laryngoscope Size: Miller and 3 Grade View: Grade I Tube type: Oral Tube size: 7.5 mm Number of attempts: 1 Airway Equipment and Method: Stylet Placement Confirmation: ETT inserted through vocal cords under direct vision,  positive ETCO2 and breath sounds checked- equal and bilateral Secured at: 22 cm Tube secured with: Tape Dental Injury: Teeth and Oropharynx as per pre-operative assessment

## 2017-09-11 NOTE — Interval H&P Note (Signed)
History and Physical Interval Note:  09/11/2017 9:23 AM  [name]  has presented today for surgery, with the diagnosis of PELVIC MASS  The various methods of treatment have been discussed with the patient and family. After consideration of risks, benefits and other options for treatment, the patient has consented to  Procedure(s): EXPLORATORY LAPAROTOMY (Bilateral) BILATERAL SALPINGECTOMY WITH OOPHERECTOMY (Bilateral) EXCISION OF SKIN TAG ON VULVA (N/A) LAPAROTOMY WITH POSSIBLE STAGING (N/A) as a surgical intervention .  The patient's history has been reviewed, patient examined, no change in status, stable for surgery.  I have reviewed the patient's chart and labs. CA 125 >900 and normal CEA, suggestive of ovarian cancer. Questions were answered to the patient's satisfaction.     Sierra Gray

## 2017-09-11 NOTE — H&P (View-Only) (Signed)
Consult Note: Gyn-Onc  Sierra Gray 56 y.o. female  CC:  Chief Complaint  Patient presents with  . Pelvic mass R19.00]    New patient    HPI: Patient is seen today in consultation at the request of Dr. Nicoletta Dress.  Patient is a very pleasant 56 year old gravida 1 para 0 who in late August after having her [place] visit set up from the dental care and felt some abdominal discomfort. The pain increased and she subsequently that evening had a temperature of 102. This was corresponding with increased abdominal pain and then the abdominal pain dissipated. Since then she's had a occasional pain and last Monday. Due to the persistent discomfort she saw her primary care physician. She describes the pain as a occurring in both her right and left lower quadrants. She did not notice any upper abdominal discomfort. There's been no change in her bowel or bladder habits. She does endorse approximately a 20-25 pound weight loss in the last 6-9 months. Much of this has been intentional. An Thanksgiving of last year to the Christmas holiday she lost 10 pounds which was not intentional but since then her weight loss has been intentional and she feels that she's eating well.  She underwent a CT scan on 08/19/2017. It revealed a trace amount of ascites in the peritoneal cavity seen prior dominant weight in the lateral paracolic gutter and along the liver capsule. There was no nodule letter peritoneal enhancement identified to suggest peritoneal carcinomatosis. There really does state that there is a mass that is cystic and solid that replaces the entire uterus. It extends caudally to the cervix and appears to invade the upper one third of the vagina. The caudal margin of the neoplasm made directly invading the posterior bladder dome to the medial thickness is normal. No fat separate the mass from the adjacent upper third of the rectum or from the mid sigmoid. The mass measures 12 x 13 x 20 cm in size and posteriorly  and laterally displaces the right ovary. The left ovary was not competent to be identified. Of note the patient had undergone a laparoscopic hysterectomy at [facility] approximate 14 years ago secondary to fibroids.  She has not yet had tumor markers drawn. She states she's never had a colonoscopy. She's not sure if her last mammogram was in 2017 but she thinks she might be due this year. She does have family history of cancer her father died of pancreatic cancer in his late 11s. His maternal aunt with breast cancer in her 4s. A paternal grandfather died of colon cancer at 38. She does endorse allergies to chlorhexidine which causes a rash. This was an issue when she had knee surgery on the right. She believes Betadine is okay.  Review of Systems: Constitutional:  Denies fever. + weight loss as above Skin: No rash, + skin tag on right inner leg she wants removed Cardiovascular: No chest pain, shortness of breath, or edema  Pulmonary: No cough or wheeze.  Gastro Intestinal: Reporting intermittent lower abdominal soreness.  No nausea, vomiting, constipation, or diarrhea reported. No bright red blood per rectum or change in bowel movement.  Genitourinary: Denies vaginal bleeding and discharge.  Musculoskeletal: + right joint pain.  Psychology: No complaints  Current Meds:  Outpatient Encounter Prescriptions as of 08/27/2017  Medication Sig  . amLODipine (NORVASC) 5 MG tablet amlodipine 5 mg tablet  TAKE 1 TAB BY MOUTH ONCE DAILY  . Apple Cider Vinegar 500 MG TABS Take 500 mg by  mouth 2 (two) times daily.  . Ascorbic Acid (VITAMIN C) 1000 MG tablet Take 1,000 mg by mouth daily.  . chlorthalidone (HYGROTON) 25 MG tablet   . loratadine (CLARITIN) 10 MG tablet Take 10 mg by mouth daily.  . Melatonin 1 MG CAPS Take 1 mg by mouth at bedtime.  . Methylsulfonylmethane (MSM) 1000 MG CAPS Take 1,000 mg by mouth daily.  . Turmeric 500 MG CAPS Take 500 mg by mouth daily.   No facility-administered encounter  medications on file as of 08/27/2017.     Allergy:  Allergies  Allergen Reactions  . Chlorhexidine Rash  . Bacitracin Rash  . Iodine Rash    Social Hx:   Social History   Social History  . Marital status: Single    Spouse name: N/A  . Number of children: N/A  . Years of education: N/A   Occupational History  . Not on file.   Social History Main Topics  . Smoking status: Never Smoker  . Smokeless tobacco: Never Used  . Alcohol use Yes     Comment: Socially drinks wine/beer  . Drug use: No  . Sexual activity: No   Other Topics Concern  . Not on file   Social History Narrative  . No narrative on file    Past Surgical Hx:  Past Surgical History:  Procedure Laterality Date  . ABDOMINAL HYSTERECTOMY  2009   Total laparoscopic hyst for fibroids  . BREAST BIOPSY  12/16/2014   2 lumps in left breast removed  . KNEE SURGERY     x3  . REDUCTION MAMMAPLASTY      Past Medical Hx:  Past Medical History:  Diagnosis Date  . Breast lump in female   . Fibroids   . Hyperlipidemia   . Hypertension   . Migraine   . Pelvic mass in female     Oncology Hx:   No history exists.    Family Hx:  Family History  Problem Relation Age of Onset  . Hypertension Mother   . Hyperlipidemia Mother   . Pancreatic cancer Father   . Hypertension Father   . Cancer Father 34       pancreatic  . Breast cancer Maternal Aunt   . Heart disease Maternal Aunt   . Cancer Maternal Aunt 28       breast  . Hyperlipidemia Maternal Uncle   . Diabetes Maternal Uncle   . Stroke Maternal Uncle   . Osteoporosis Maternal Uncle   . Heart attack Maternal Grandmother   . Diabetes Maternal Grandfather   . Dementia Maternal Grandfather   . Osteoporosis Paternal Grandmother   . Cancer Paternal Grandmother   . Colon cancer Paternal Grandfather     Vitals:  Blood pressure 139/88, pulse 98, temperature 98.4 F (36.9 C), temperature source Oral, resp. rate 20, height 5\' 4"  (1.626 m), weight 187  lb 4.8 oz (85 kg), SpO2 100 %.  Physical Exam: Well-nourished well-developed female in no acute distress.  Neck: Supple, no lymphadenopathy, no thyromegaly.  Lungs: Clear to auscultation bilaterally.  Cardiac: Regular rate and rhythm.  Abdomen: Obese, soft, nontender, nondistended. There is a palpable mass appreciated just under the umbilicus. There is no fluid wave. The mass is not tender. The patient was able to feel the mass when we identified the edge of the mass for her.  Groins: No lymphadenopathy.  Extremities no edema.  Pelvic: External genitalia is within normal limits. There is approximately an 8 mm skin tag coming  off the interval right thigh. Bimanual examination reveals a large abdominal pelvic mass measuring approximately 16-18 cm. It is nontender. It is somewhat mobile with the vaginal hand. It is not ballotable. On rectovaginal examination there is no nodularity.  Assessment/Plan: 56 year old with complex adnexal mass. We will resubmit her CT scan for reread here. 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   I cannot clearly identify which ovary is coming from but we discussed that we would proceed with exploratory laparotomy and removal of bilateral adnexa. The affected adnexa will be sent for frozen section. If this is a malignancy, we would proceed with appropriate staging to include but not limited to lymphadenectomy, peritoneal biopsies, omentectomy, and debulking surgery. Risks and benefits of surgery including but not limited to bleeding, infection, injury to surrounding organs, possible bowel surgery were discussed with the patient. We discussed the risk of DVT PE and that she most likely will be going home with Lovenox injections for 28 days. She states that she develops a rash to chlorhexidine therefore we will not use  that. She states that she believes Betadine is okay. Therefore, that will be the prep of choice for her procedure. She also requests that the skin tag be removed from her right thigh.  We will send a CA-125 and CEA today. As we were drawing blood, we also sent a CBC and chemistries. She is tentatively scheduled for surgery for October 4 with Dr. Everitt Amber. She understands that she'll the opportunity to meet Dr. Denman George the morning of surgery.  Her questions as well as those of her mother's were elicited in answer to their satisfaction.  We appreciate the opportunity to partner in the care of this very pleasant patient.  Eliz Nigg A., MD 08/27/2017, 10:22 AM

## 2017-09-12 LAB — BASIC METABOLIC PANEL
Anion gap: 9 (ref 5–15)
BUN: 11 mg/dL (ref 6–20)
CHLORIDE: 102 mmol/L (ref 101–111)
CO2: 27 mmol/L (ref 22–32)
CREATININE: 0.86 mg/dL (ref 0.44–1.00)
Calcium: 8.7 mg/dL — ABNORMAL LOW (ref 8.9–10.3)
GFR calc Af Amer: >60 mL/min (ref >60)
GFR calc non Af Amer: >60 mL/min (ref >60)
GLUCOSE: 138 mg/dL — AB (ref 65–99)
POTASSIUM: 3.3 mmol/L — AB (ref 3.5–5.1)
Sodium: 138 mmol/L (ref 135–145)

## 2017-09-12 LAB — CBC
HCT: 32.3 % — ABNORMAL LOW (ref 36.0–46.0)
Hemoglobin: 10.5 g/dL — ABNORMAL LOW (ref 12.0–15.0)
MCH: 26.8 pg (ref 26.0–34.0)
MCHC: 32.5 g/dL (ref 30.0–36.0)
MCV: 82.4 fL (ref 78.0–100.0)
Platelets: 366 10*3/uL (ref 150–400)
RBC: 3.92 MIL/uL (ref 3.87–5.11)
RDW: 15.6 % — ABNORMAL HIGH (ref 11.5–15.5)
WBC: 15.6 10*3/uL — AB (ref 4.0–10.5)

## 2017-09-12 MED ORDER — NONFORMULARY OR COMPOUNDED ITEM
1.0000 | Freq: Three times a day (TID) | Status: DC
  Administered 2017-09-13: 1 via ORAL
  Filled 2017-09-12: qty 1

## 2017-09-12 NOTE — Progress Notes (Signed)
1 Day Post-Op Procedure(s) (LRB): EXPLORATORY LAPAROTOMY (Bilateral) BILATERAL SALPINGECTOMY WITH OOPHERECTOMY (Bilateral) EXCISION OF SKIN TAG ON THIGH (N/A) PELVIC AND PARA AORTIC LYMPH NODE  DISSECTION (N/A) OMENTECTOMY (N/A)  Subjective: Patient reports minimal incisional soreness.  Ambulating without difficulty.  Just ordered solid food for breakfast but no nausea or emesis at this time.  Due to void since foley removal.  Denies chest pain, dyspnea, passing flatus.  No concerns voiced.    Objective: Vital signs in last 24 hours: Temp:  [97.8 F (36.6 C)-99 F (37.2 C)] 98.2 F (36.8 C) (10/05 0955) Pulse Rate:  [65-90] 90 (10/05 0955) Resp:  [14-18] 18 (10/05 0955) BP: (101-120)/(65-76) 110/65 (10/05 0955) SpO2:  [96 %-100 %] 96 % (10/05 0955) Last BM Date: 09/11/17  Intake/Output from previous day: 10/04 0701 - 10/05 0700 In: 3634.2 [P.O.:240; I.V.:3044.2; IV Piggyback:350] Out: 1610 [Urine:1350; Blood:200]  Physical Examination: General: alert, cooperative and no distress Resp: clear to auscultation bilaterally Cardio: regular rate and rhythm, S1, S2 normal, no murmur, click, rub or gallop GI: incision: Midline incision with honeycomb dressing in place with no erythema or drainage noted and abdomen soft, slightly distended, hypoactive bowel sounds Extremities: extremities normal, atraumatic, no cyanosis or edema  Labs: WBC/Hgb/Hct/Plts:  15.6/10.5/32.3/366 (10/05 0444) BUN/Cr/glu/ALT/AST/amyl/lip:  11/0.86/--/--/--/--/-- (10/05 0444)  Assessment: 56 y.o. s/p Procedure(s): EXPLORATORY LAPAROTOMY BILATERAL SALPINGECTOMY WITH OOPHERECTOMY EXCISION OF SKIN TAG ON THIGH PELVIC AND PARA AORTIC LYMPH NODE  DISSECTION OMENTECTOMY: stable Pain:  Pain is well-controlled on PRN medications.  Heme: Hgb 10.5 and Hct 32.3 this am.  Stable post-op.  CV: BP and HR stable post-op.  Continue to monitor with ordered vital sign assessments.  GI:  Tolerating po: Yes.    GU: Due  to void since foley removal.  Adequate output reported.    FEN: Stable post-operatively.  Prophylaxis: pharmacologic prophylaxis (with any of the following: enoxaparin (Lovenox) 54m SQ 2 hours prior to surgery then every day) and intermittent pneumatic compression boots.  Plan: Diet as tolerated Saline lock if diet tolerated Encourage ambulating, IS use, deep breathing, and coughing Lovenox teaching kit for discharge Continue post-op plan per Dr. JDelsa Sale  LOS: 1 day    Sierra Gray DEAL 09/12/2017, 1:09 PM

## 2017-09-13 MED ORDER — OXYCODONE HCL 5 MG PO TABS: 5.0000 mg | ORAL_TABLET | ORAL | 0 refills | Status: DC | PRN

## 2017-09-13 MED ORDER — GLYCERIN (LAXATIVE) 2.1 G RE SUPP
1.0000 | Freq: Once | RECTAL | Status: AC
Start: 2017-09-13 — End: 2017-09-13
  Administered 2017-09-13: 1 via RECTAL
  Filled 2017-09-13: qty 1

## 2017-09-13 MED ORDER — ENOXAPARIN SODIUM 40 MG/0.4ML ~~LOC~~ SOLN: 40.0000 mg | SUBCUTANEOUS | 0 refills | Status: DC

## 2017-09-13 NOTE — Progress Notes (Signed)
Nurse reviewed discharge instructions with pt.  Pt verbalized understanding of discharge instructions, follow up appointment and new medications.  Pt stated she feels comfortable with lovenox injections at home.  Prescriptions given to pt prior to discharge.

## 2017-09-13 NOTE — Discharge Instructions (Signed)
Bilateral Salpingo-Oophorectomy, Care After °This sheet gives you information about how to care for yourself after your procedure. Your health care provider may also give you more specific instructions. If you have problems or questions, contact your health care provider. °What can I expect after the procedure? °After the procedure, it is common to have: °· Abdominal pain. °· Some occasional vaginal bleeding (spotting). °· Tiredness. °· Symptoms of menopause, such as hot flashes, night sweats, or mood swings. ° °Follow these instructions at home: °Incision care °· Keep your incision area and your bandage (dressing) clean and dry. °· Follow instructions from your health care provider about how to take care of your incision. Make sure you: °? Wash your hands with soap and water before you change your dressing. If soap and water are not available, use hand sanitizer. °? Change your dressing as told by your health care provider. °? Leave stitches (sutures), staples, skin glue, or adhesive strips in place. These skin closures may need to stay in place for 2 weeks or longer. If adhesive strip edges start to loosen and curl up, you may trim the loose edges. Do not remove adhesive strips completely unless your health care provider tells you to do that. °· Check your incision area every day for signs of infection. Check for: °? Redness, swelling, or pain. °? Fluid or blood. °? Warmth. °? Pus or a bad smell. °Activity °· Do not drive or use heavy machinery while taking prescription pain medicine. °· Do not drive for 24 hours if you received a medicine to help you relax (sedative) during your procedure. °· Take frequent, short walks throughout the day. Rest when you get tired. Ask your health care provider what activities are safe for you. °· Avoid activity that requires great effort. Also, avoid heavy lifting. Do not lift anything that is heavier than 10 lbs. (4.5 kg), or the limit that your health care provider tells you,  until he or she says that it is safe to do so. °· Do not douche, use tampons, or have sex until your health care provider approves. °General instructions °· To prevent or treat constipation while you are taking prescription pain medicine, your health care provider may recommend that you: °? Drink enough fluid to keep your urine clear or pale yellow. °? Take over-the-counter or prescription medicines. °? Eat foods that are high in fiber, such as fresh fruits and vegetables, whole grains, and beans. °? Limit foods that are high in fat and processed sugars, such as fried and sweet foods. °· Take over-the-counter and prescription medicines only as told by your health care provider. °· Do not take baths, swim, or use a hot tub until your health care provider approves. Ask your health care provider if you can take showers. You may only be allowed to take sponge baths for bathing. °· Wear compression stockings as told by your health care provider. These stockings help to prevent blood clots and reduce swelling in your legs. °· Keep all follow-up visits as told by your health care provider. This is important. °Contact a health care provider if: °· You have pain when you urinate. °· You have pus or a bad smelling discharge coming from your vagina. °· You have redness, swelling, or pain around your incision. °· You have fluid or blood coming from your incision. °· Your incision feels warm to the touch. °· You have pus or a bad smell coming from your incision. °· You have a fever. °· Your incision   starts to break open. °· You have pain in the abdomen, and it gets worse or does not get better when you take medicine. °· You develop a rash. °· You develop nausea and vomiting. °· You feel lightheaded. °Get help right away if: °· You develop pain in your chest or leg. °· You become short of breath. °· You faint. °· You have increased bleeding from your vagina. °Summary °· After the procedure, it is common to have pain, bleeding in  the vagina, and symptoms of menopause. °· Follow instructions from your health care provider about how to take care of your incision. °· Follow instructions from your health care provider about activities and restrictions. °· Check your incision every day for signs of infection and report any symptoms to your health care provider. °This information is not intended to replace advice given to you by your health care provider. Make sure you discuss any questions you have with your health care provider. °Document Released: 11/25/2005 Document Revised: 12/30/2016 Document Reviewed: 12/30/2016 °Elsevier Interactive Patient Education © 2018 [employer] ° °

## 2017-09-13 NOTE — Discharge Summary (Signed)
Physician Discharge Summary  Patient ID: GENI SKORUPSKI MRN: [id number] DOB/AGE: 55-May-1962 55 y.o.  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   The postoperative course was uneventful.  She was discharged to home on postoperative day 2 tolerating a regular diet, meeting all postoperative goals.  Consults: None  Significant Diagnostic Studies: None  Treatments: surgery: see above  Discharge Exam: Blood pressure 113/81, pulse 80, temperature 97.9 F (36.6 C), temperature source Axillary, resp. rate 17, height 5\' 4"  (1.626 m), weight 185 lb (83.9 kg), SpO2 95 %. General appearance: alert Resp: clear to auscultation bilaterally Cardio: regular rate and rhythm, S1, S2 normal, no murmur, click, rub or gallop GI: softly distended Extremities: extremities normal, atraumatic, no cyanosis or edema Incision/Wound: C/D/I  Disposition: Final discharge disposition not confirmed  Discharge Instructions    Activity as tolerated - No restrictions    Complete by:  As directed    Call MD for:  extreme fatigue    Complete by:  As directed    Call MD for:  persistant dizziness or light-headedness    Complete by:  As directed    Call MD for:  persistant nausea and vomiting    Complete by:  As directed    Call MD for:  redness, tenderness, or signs of infection (pain, swelling, redness, odor or green/yellow discharge around incision site)    Complete by:  As directed    Call MD for:  severe uncontrolled pain    Complete by:  As directed    Call  MD for:  temperature >100.4    Complete by:  As directed    Diet - low sodium heart healthy    Complete by:  As directed    Discharge instructions    Complete by:  As directed    Activity: 1. Be up and out of the bed during the day.  Take a nap if needed.  You may walk up steps but be careful and use the hand rail.  Stair climbing will tire you more than you think, you may need to stop part way and rest.   2. No lifting or straining for 6 weeks.  3. No driving for 1-2 weeks.  Do Not drive if you are taking narcotic pain medicine.  4. Shower daily.  Use soap and water on your incision and pat dry; don't rub.   5. No sexual activity and nothing in the vagina for 4 weeks.  Diet: 1. Low sodium Heart Healthy Diet is recommended.  2. It is safe to use a laxative if you have difficulty moving your bowels.   Wound Care: 1. Keep clean and dry.  Shower daily.  Reasons to call the Doctor:  Fever - Oral temperature greater than 100.4 degrees Fahrenheit Foul-smelling vaginal discharge Difficulty urinating Nausea and vomiting Increased pain at the site of the incision that is unrelieved with pain medicine. Difficulty breathing with or without chest pain New calf pain especially if only on one side Sudden, continuing increased vaginal bleeding with or without clots.   Follow-up: 1. See Everitt Amber in 4 weeks.  Contacts: For  questions or concerns you should contact:  Dr. Everitt Amber at [phone]  or at [place]   Discharge wound care:    Complete by:  As directed    Keep clean and dry   Driving Restrictions    Complete by:  As directed    No driving for 1- 2 weeks   Increase activity slowly    Complete by:  As directed    Lifting restrictions    Complete by:  As directed    No lifting > 5 lbs for 6 weeks   May shower / Bathe    Complete by:  As directed    No tub baths for 6 weeks   Sexual Activity Restrictions    Complete by:  As directed    No intercourse  for 6 - 8 weeks     Allergies as of 09/13/2017      Reactions   Bee Venom Anaphylaxis   Chlorhexidine Rash   Surgical prep wash   Other    Ortho prep   Bacitracin Rash   Iodine Rash      Medication List    TAKE these medications   amLODipine 5 MG tablet Commonly known as:  NORVASC Take 5 mg by mouth every evening.   Apple Cider Vinegar 500 MG Tabs Take 500 mg by mouth 2 (two) times daily.   chlorthalidone 25 MG tablet Commonly known as:  HYGROTON Take 25 mg by mouth every evening.   enoxaparin 40 MG/0.4ML injection Commonly known as:  LOVENOX Inject 0.4 mLs (40 mg total) into the skin daily.   HAIR/SKIN/NAILS Tabs Take 2 tablets by mouth every morning.   loratadine 10 MG tablet Commonly known as:  CLARITIN Take 10 mg by mouth every morning.   Melatonin 1 MG Caps Take 1 mg by mouth at bedtime.   MSM 1000 MG Caps Take 1,000 mg by mouth every morning.   oxyCODONE 5 MG immediate release tablet Commonly known as:  Oxy IR/ROXICODONE Take 1 tablet (5 mg total) by mouth every 4 (four) hours as needed for severe pain or breakthrough pain.   SALONPAS PAIN RELIEF PATCH EX Place 1 patch onto the skin daily as needed (knee pain).   Turmeric 500 MG Caps Take 500 mg by mouth every morning.   vitamin C 1000 MG tablet Take 1,000 mg by mouth every morning.            Discharge Care Instructions        Start     Ordered   09/13/17 0000  Discharge wound care:    Comments:  Keep clean and dry   09/13/17 1111     Follow-up Information    Everitt Amber, MD. Schedule an appointment as soon as possible for a visit in 1 month(s).   Specialty:  Obstetrics and Gynecology Contact information: [place] [address] [phone]           Signed: Agnes Lawrence 09/13/2017, 11:20 AM

## 2017-09-15 ENCOUNTER — Telehealth: Payer: Self-pay | Admitting: Gynecologic Oncology

## 2017-09-15 DIAGNOSIS — M79669 Pain in unspecified lower leg: Secondary | ICD-10-CM

## 2017-09-15 MED FILL — Medication: ORAL | Qty: 1 | Status: AC

## 2017-09-15 NOTE — Telephone Encounter (Signed)
Returned call to patient.  She states she is doing well except for tingling/discomfort.  She states the tingling began in the right thigh then moved to the right calf.  Advised that we would order a doppler to rule out DVT due to higher risk for this being post-op, having cancer debulking surgery.  Called back and informed patient that her doppler would be tomorrow at [facility] at 10 am.  Verbalizing understanding.  Advised to call for any needs

## 2017-09-16 ENCOUNTER — Ambulatory Visit (HOSPITAL_COMMUNITY)
Admission: RE | Admit: 2017-09-16 | Discharge: 2017-09-16 | Disposition: A | Discharge status: Home / Self Care | Payer: BLUE CROSS/BLUE SHIELD | Source: Ambulatory Visit | Attending: Gynecologic Oncology | Admitting: Gynecologic Oncology

## 2017-09-16 DIAGNOSIS — R202 Paresthesia of skin: Secondary | ICD-10-CM | POA: Insufficient documentation

## 2017-09-16 DIAGNOSIS — M79661 Pain in right lower leg: Secondary | ICD-10-CM | POA: Diagnosis present

## 2017-09-16 DIAGNOSIS — M79669 Pain in unspecified lower leg: Secondary | ICD-10-CM

## 2017-09-16 NOTE — Progress Notes (Signed)
**  Preliminary report by tech**  Right lower extremity venous duplex complete. There is no evidence of deep or superficial vein thrombosis involving the right lower extremity. All visualized vessels appear patent and compressible. There is no evidence of a Baker's cyst on the right. Results were faxed to [name] NP.  09/16/17 10:13 AM Sierra Gray RVT

## 2017-09-17 ENCOUNTER — Telehealth: Payer: Self-pay | Admitting: Gynecologic Oncology

## 2017-09-17 NOTE — Telephone Encounter (Signed)
Patient advised of path results.  Follow up appt moved up to Oct 22.  No concerns voiced.  Advised to call for any needs.

## 2017-09-29 ENCOUNTER — Ambulatory Visit: Payer: BLUE CROSS/BLUE SHIELD | Attending: Gynecologic Oncology | Admitting: Gynecologic Oncology

## 2017-09-29 ENCOUNTER — Encounter: Payer: Self-pay | Admitting: Gynecologic Oncology

## 2017-09-29 VITALS — BP 141/95 | HR 86 | Temp 98.3°F | Resp 18 | Wt 175.1 lb

## 2017-09-29 DIAGNOSIS — I10 Essential (primary) hypertension: Secondary | ICD-10-CM | POA: Insufficient documentation

## 2017-09-29 DIAGNOSIS — Z7189 Other specified counseling: Secondary | ICD-10-CM | POA: Diagnosis not present

## 2017-09-29 DIAGNOSIS — Z800 Family history of malignant neoplasm of digestive organs: Secondary | ICD-10-CM | POA: Diagnosis not present

## 2017-09-29 DIAGNOSIS — L03311 Cellulitis of abdominal wall: Secondary | ICD-10-CM

## 2017-09-29 DIAGNOSIS — Z803 Family history of malignant neoplasm of breast: Secondary | ICD-10-CM | POA: Insufficient documentation

## 2017-09-29 DIAGNOSIS — C541 Malignant neoplasm of endometrium: Secondary | ICD-10-CM | POA: Diagnosis present

## 2017-09-29 DIAGNOSIS — C562 Malignant neoplasm of left ovary: Secondary | ICD-10-CM

## 2017-09-29 DIAGNOSIS — Z79899 Other long term (current) drug therapy: Secondary | ICD-10-CM | POA: Diagnosis not present

## 2017-09-29 DIAGNOSIS — E785 Hyperlipidemia, unspecified: Secondary | ICD-10-CM | POA: Diagnosis not present

## 2017-09-29 DIAGNOSIS — R1900 Intra-abdominal and pelvic swelling, mass and lump, unspecified site: Secondary | ICD-10-CM

## 2017-09-29 MED ORDER — CEPHALEXIN 500 MG PO CAPS: 500.0000 mg | ORAL_CAPSULE | Freq: Four times a day (QID) | ORAL | 1 refills | Status: DC

## 2017-09-29 NOTE — Progress Notes (Signed)
Consult Note: Gyn-Onc  Sierra Gray 56 y.o. female  CC:  Chief Complaint  Patient presents with  . 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   Recommend 6 cycles adjuvant carb/tax chemotherapy in accordance with NCCN guidelines.  Discussed risk for recurrence with and without adjuvant chemotherapy.  Discussed anticipated course of therapy and side-effects.  Follow-up has been established with Dr Heath Lark.   I will see her back after completion of therapy to transition into surveillance care.  Mild cellulitis - 1 week keflex prescribed.  HPI: Patient was seen in consultation at the request of Dr. Nicoletta Dress.  Patient is a very pleasant 56 year old gravida 1 para 0 who in late August after having her [place] visit set up from the dental care and felt some abdominal discomfort. The pain increased and she subsequently that evening had a temperature of 102. This was corresponding with increased abdominal pain and then the abdominal pain dissipated. Since then she's had a occasional pain and last Monday. Due to the persistent discomfort she saw her primary care physician. She describes the pain as a occurring in both her right and left lower quadrants. She did not notice any upper abdominal discomfort. There's been no change in her bowel or bladder habits. She does endorse approximately a 20-25 pound weight loss in the last 6-9 months. Much of this has been intentional. An Thanksgiving of last year to the Christmas holiday she lost 10 pounds which was not intentional but since then her weight loss has been intentional and she feels that she's eating well.  She underwent a CT scan on 08/19/2017. It revealed a trace amount of ascites in the peritoneal cavity seen prior dominant weight in the lateral paracolic gutter and along the liver capsule. There was no nodule letter peritoneal enhancement identified to suggest peritoneal  carcinomatosis. There really does state that there is a mass that is cystic and solid that replaces the entire uterus. It extends caudally to the cervix and appears to invade the upper one third of the vagina. The caudal margin of the neoplasm made directly invading the posterior bladder dome to the medial thickness is normal. No fat separate the mass from the adjacent upper third of the rectum or from the mid sigmoid. The mass measures 12 x 13 x 20 cm in size and posteriorly and laterally displaces the right ovary. The left ovary was not competent to be identified. Of note the patient had undergone a laparoscopic hysterectomy at [facility] approximate 14 years ago secondary to fibroids.  She has not yet had tumor markers drawn. She states she's never had a colonoscopy. She's not sure if her last mammogram was in 2017 but she thinks she might be due this year. She does have family history of cancer her father died of pancreatic cancer in his late 61s. His maternal aunt with breast cancer in her 35s. A paternal grandfather died of colon cancer at 57. She does endorse allergies to chlorhexidine which causes a rash. This was an issue when she had knee surgery on the right. She believes Betadine is okay.  Interval Hx:  On 09/11/17 she underwent ex lap, BSO, omentectomy, pelvic and para-aortic lymphadenectomy, radical tumor debulking. Intraoperative findings were significant for a 20+cm left ovarian mass with nromal right ovary and omentum. No extraovarian disease was seen. Frozen confirmed adenocarcinoma.  There was no gross residual disease at completion of the surgery.  Final pathology confirmed a 20cm  grade 1 endometrioid left ovarian adenocarcinoma with surface involvement but negative washings (stage IC2).  All other resected tissue (including omentum, nodes and peritoneal biopsy and washings) were negative.  In accordance with NCCN guidelines she was recommended adjuvant carboplatin and paclitaxel  chemotherapy and her case was discussed at multi-disciplinary tumor conference.  She did well from surgery and has no concerns other than mild erythema around incision but no drainage or fevers.  Review of Systems: Constitutional:  Denies fever. + weight loss as above Skin: + erythema on wound Cardiovascular: No chest pain, shortness of breath, or edema  Pulmonary: No cough or wheeze.  Gastro Intestinal: Reporting intermittent lower abdominal soreness.  No nausea, vomiting, constipation, or diarrhea reported. No bright red blood per rectum or change in bowel movement.  Genitourinary: Denies vaginal bleeding and discharge.  Musculoskeletal: + right joint pain.  Psychology: No complaints  Current Meds:  Outpatient Encounter Prescriptions as of 09/29/2017  Medication Sig  . amLODipine (NORVASC) 5 MG tablet Take 5 mg by mouth every evening.  Marland Kitchen Apple Cider Vinegar 500 MG TABS Take 500 mg by mouth 2 (two) times daily.  . Ascorbic Acid (VITAMIN C) 1000 MG tablet Take 1,000 mg by mouth every morning.   . cephALEXin (KEFLEX) 500 MG capsule Take 1 capsule (500 mg total) by mouth 4 (four) times daily.  . chlorthalidone (HYGROTON) 25 MG tablet Take 25 mg by mouth every evening.   . enoxaparin (LOVENOX) 40 MG/0.4ML injection Inject 0.4 mLs (40 mg total) into the skin daily.  . Liniments (SALONPAS PAIN RELIEF PATCH EX) Place 1 patch onto the skin daily as needed (knee pain).  Marland Kitchen loratadine (CLARITIN) 10 MG tablet Take 10 mg by mouth every morning.   . Melatonin 1 MG CAPS Take 1 mg by mouth at bedtime.  . Methylsulfonylmethane (MSM) 1000 MG CAPS Take 1,000 mg by mouth every morning.   . Multiple Vitamins-Minerals (HAIR/SKIN/NAILS) TABS Take 2 tablets by mouth every morning.  . Turmeric 500 MG CAPS Take 500 mg by mouth every morning.   . [DISCONTINUED] oxyCODONE (OXY IR/ROXICODONE) 5 MG immediate release tablet Take 1 tablet (5 mg total) by mouth every 4 (four) hours as needed for severe pain or  breakthrough pain.   No facility-administered encounter medications on file as of 09/29/2017.     Allergy:  Allergies  Allergen Reactions  . Bee Venom Anaphylaxis  . Chlorhexidine Rash    Surgical prep wash  . Other     Ortho prep  . Bacitracin Rash  . Iodine Rash    Social Hx:   Social History   Social History  . Marital status: Single    Spouse name: N/A  . Number of children: N/A  . Years of education: N/A   Occupational History  . Not on file.   Social History Main Topics  . Smoking status: Never Smoker  . Smokeless tobacco: Never Used  . Alcohol use Yes     Comment: Socially drinks wine/beer  . Drug use: No  . Sexual activity: No   Other Topics Concern  . Not on file   Social History Narrative  . No narrative on file    Past Surgical Hx:  Past Surgical History:  Procedure Laterality Date  . 2 lumps right breast removed  yrs ago  . ABDOMINAL HYSTERECTOMY  2009   Total laparoscopic hyst for fibroids  . BILATERAL SALPINGECTOMY Bilateral 09/11/2017   Procedure: BILATERAL SALPINGECTOMY WITH OOPHERECTOMY;  Surgeon: Everitt Amber, MD;  Location: [facility];  Service: Gynecology;  Laterality: Bilateral;  . BREAST BIOPSY  12/16/2014   2 lumps in  right breast removed  . EXCISION OF SKIN TAG N/A 09/11/2017   Procedure: EXCISION OF SKIN TAG ON THIGH;  Surgeon: Everitt Amber, MD;  Location: [facility];  Service: Gynecology;  Laterality: N/A;  . KNEE SURGERY Right    x3  . LAPAROTOMY Bilateral 09/11/2017   Procedure: EXPLORATORY LAPAROTOMY;  Surgeon: Everitt Amber, MD;  Location: [facility];  Service: Gynecology;  Laterality: Bilateral;  . LAPAROTOMY WITH STAGING N/A 09/11/2017   Procedure: PELVIC AND PARA AORTIC LYMPH NODE  DISSECTION;  Surgeon: Everitt Amber, MD;  Location: [facility];  Service: Gynecology;  Laterality: N/A;  . OMENTECTOMY N/A 09/11/2017   Procedure: OMENTECTOMY;  Surgeon: Everitt Amber, MD;  Location: [facility];  Service: Gynecology;  Laterality: N/A;  . REDUCTION  MAMMAPLASTY    . WISDOM TOOTH EXTRACTION      Past Medical Hx:  Past Medical History:  Diagnosis Date  . Anemia    hx of  . Arthritis    oa knees  . Breast lump in female    benign  . Fibroids   . Hyperlipidemia   . Hypertension   . Migraine    hx of   . Pelvic mass in female   . Pelvic mass in female   . PONV (postoperative nausea and vomiting)    nausea only  . Sleep apnea     Oncology Hx:   No history exists.    Family Hx:  Family History  Problem Relation Age of Onset  . Hypertension Mother   . Hyperlipidemia Mother   . Pancreatic cancer Father   . Hypertension Father   . Cancer Father 36       pancreatic  . Breast cancer Maternal Aunt   . Heart disease Maternal Aunt   . Cancer Maternal Aunt 10       breast  . Hyperlipidemia Maternal Uncle   . Diabetes Maternal Uncle   . Stroke Maternal Uncle   . Osteoporosis Maternal Uncle   . Heart attack Maternal Grandmother   . Diabetes Maternal Grandfather   . Dementia Maternal Grandfather   . Osteoporosis Paternal Grandmother   . Cancer Paternal Grandmother   . Colon cancer Paternal Grandfather     Vitals:  Blood pressure (!) 141/95, pulse 86, temperature 98.3 F (36.8 C), temperature source Oral, resp. rate 18, weight 175 lb 1.6 oz (79.4 kg), SpO2 99 %.  Physical Exam: Well-nourished well-developed female in no acute distress.  Neck: Supple, no lymphadenopathy, no thyromegaly.  Lungs: Clear to auscultation bilaterally.  Cardiac: Regular rate and rhythm.  Abdomen: Obese, soft, nontender, nondistended. The incision exhibits erythema which is blanching near to incision. No drainage or collections appreciated.  Groins: No lymphadenopathy.  Extremities no edema.  Pelvic: deferred   30 minutes of direct face to face counseling time was spent with the patient. This included discussion about prognosis, therapy recommendations and postoperative side effects and are beyond the scope of routine postoperative  care.   Donaciano Eva, MD 09/29/2017, 4:51 PM

## 2017-09-29 NOTE — Patient Instructions (Signed)
You have been prescribed keflex antibiotic to take four times a day for 1 week.  If your redness has not improved after completing course, refill the prescription for a second week.  Dr Serita Grit office will facilitate an appointment with Dr Calton Dach office for chemotherapy discussion.

## 2017-09-29 NOTE — Progress Notes (Signed)
Gynecologic Oncology Multi-Disciplinary Disposition Conference Note  Date of the Conference: September 29, 2017  Patient Name: Sierra Gray  Referring Provider: Dr. Truett Mainland Primary GYN Oncologist: Dr. 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  Disposition is to 6 cycles of carboplatin and taxol.   This Multidisciplinary conference took place involving physicians from [place], [place], Radiation Oncology, Pathology, Radiology along with the Gynecologic Oncology Nurse Practitioner and RN.  Comprehensive assessment of the patient's malignancy, staging, need for surgery, chemotherapy, radiation therapy, and need for further testing were reviewed. Supportive measures, both inpatient and following discharge were also discussed. The recommended plan of care is documented. Greater than 35 minutes were spent correlating and coordinating this patient's care.

## 2017-10-07 ENCOUNTER — Encounter: Payer: Self-pay | Admitting: Hematology and Oncology

## 2017-10-07 ENCOUNTER — Telehealth: Payer: Self-pay | Admitting: Hematology and Oncology

## 2017-10-07 ENCOUNTER — Ambulatory Visit (HOSPITAL_BASED_OUTPATIENT_CLINIC_OR_DEPARTMENT_OTHER): Payer: BLUE CROSS/BLUE SHIELD | Admitting: Hematology and Oncology

## 2017-10-07 VITALS — BP 128/81 | HR 85 | Temp 98.7°F | Resp 20 | Ht 64.0 in | Wt 175.5 lb

## 2017-10-07 DIAGNOSIS — T8131XA Disruption of external operation (surgical) wound, not elsewhere classified, initial encounter: Secondary | ICD-10-CM

## 2017-10-07 DIAGNOSIS — C562 Malignant neoplasm of left ovary: Secondary | ICD-10-CM

## 2017-10-07 MED ORDER — ONDANSETRON HCL 8 MG PO TABS: 8.0000 mg | ORAL_TABLET | Freq: Three times a day (TID) | ORAL | 1 refills | Status: DC | PRN

## 2017-10-07 MED ORDER — DEXAMETHASONE 4 MG PO TABS: ORAL_TABLET | ORAL | 1 refills | Status: DC

## 2017-10-07 MED ORDER — PROCHLORPERAZINE MALEATE 10 MG PO TABS: 10.0000 mg | ORAL_TABLET | Freq: Four times a day (QID) | ORAL | 1 refills | Status: DC | PRN

## 2017-10-07 NOTE — Progress Notes (Signed)
START ON PATHWAY REGIMEN - Ovarian     A cycle is every 21 days:     Paclitaxel      Carboplatin   **Always confirm dose/schedule in your pharmacy ordering system**    Patient Characteristics: Newly Diagnosed, Adjuvant Therapy, Stage IC, Grade 1 or 2 AJCC T Category: T1c AJCC N Category: N0 AJCC M Category: M0 Therapeutic Status: Newly Diagnosed, Adjuvant Therapy AJCC 8 Stage Grouping: IC Tumor Grade: 1 Intent of Therapy: Curative Intent, Discussed with Patient

## 2017-10-07 NOTE — Telephone Encounter (Signed)
Scheduled appt per 10/30 los - unable to schedule 11/7 due to capped day - message sent to [facility] . Will contact patient when appt is schedule.

## 2017-10-08 DIAGNOSIS — T8131XA Disruption of external operation (surgical) wound, not elsewhere classified, initial encounter: Secondary | ICD-10-CM | POA: Insufficient documentation

## 2017-10-08 NOTE — Assessment & Plan Note (Addendum)
We reviewed the NCCN guidelines We discussed the role of chemotherapy. The intent is of curative intent.  We discussed some of the risks, benefits, side-effects of carboplatin & Taxol. Treatment is intravenous, every 3 weeks x 6 cycles  Some of the short term side-effects included, though not limited to, including weight loss, life threatening infections, risk of allergic reactions, need for transfusions of blood products, nausea, vomiting, change in bowel habits, loss of hair, admission to hospital for various reasons, and risks of death.   Long term side-effects are also discussed including risks of infertility, permanent damage to nerve function, hearing loss, chronic fatigue, kidney damage with possibility needing hemodialysis, and rare secondary malignancy including bone marrow disorders.  The patient is aware that the response rates discussed earlier is not guaranteed.  After a long discussion, patient made an informed decision to proceed with the prescribed plan of care.   Patient education material was dispensed. I will schedule chemo education class and blood work We discussed premedication with dexamethasone before chemotherapy. We discussed port placement but the patient declined I will get her scheduled for chemotherapy next week, as soon as possible I will see her back prior to cycle 2 of therapy I told her to discontinue tumeric and apple cider vinegar due to potential at direction with chemotherapy We discussed significantly about the logistics of chemotherapy scheduling, etc. and the timing of treatment I highly recommend the patient take extended leave but due to her insurance situation, she may have to work part-time She will get back to me after she talked to her supervisor related to work We also discussed referral to Dietitian and she agreed

## 2017-10-08 NOTE — Assessment & Plan Note (Signed)
She is noted to have mild incisional wound drainage Clinically, it does not appear infected She is completing a course of antibiotic treatment I recommend dry dressing changes I felt that it should not delay to start of chemotherapy next week

## 2017-10-08 NOTE — Progress Notes (Signed)
[place] CONSULT NOTE  Patient Care Team: Nicoletta Dress, MD as PCP - General (Internal Medicine)  CHIEF COMPLAINTS/PURPOSE OF CONSULTATION:  Recent diagnosis of ovarian cancer, for adjuvant treatment  HISTORY OF PRESENTING ILLNESS:  Sierra Gray 56 y.o. female is here because of recent diagnosis of ovarian cancer. Her mother is present. This patient had remote history of hysterectomy for fibroids, with benign pathology She was also found to have breast lump, status post lumpectomy with benign pathology. The patient does not have any children Her symptoms started with vague abdominal cramps/discomfort. She underwent extensive evaluation.  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 Soft tissue, biopsy, right medial thigh MATURE LIPOMA 3. Ovary and fallopian tube, right ENDOMETRIOMA AND SIMPLE SEROUS CYST WITH FALLOPIAN TUBE ADHESION NEGATIVE FOR MALIGNANCY 4. Omentum, resection for tumor BENIGN OMENTUM NEGATIVE FOR CARCINOMA 5. Lymph nodes, regional resection, right pelvic EIGHT BENIGN LYMPH NODES (0/8) 6. Lymph nodes, regional resection, left pelvic SEVEN BENIGN LYMPH NODES (0/7) 7. Lymph node, biopsy, right para-aortic FOUR BENIGN LYMPH NODES (0/4) 8. Lymph node, biopsy, left para-aortic ONE BENIGN LYMPH NODE (0/1) 9. Peritoneum, biopsy, left abdominal BENIGN FIBROMUSCULAR TISSUE 10. Peritoneum, biopsy, right abdominal BENIGN FIBROMUSCULAR TISSUE WITH SEROSITIS 11. Peritoneum, biopsy, pelvic FIBROADIPOSE TISSUE WITH SEROSITIS  Specimen(s): Ovary and  fallopian tube Procedure: (including lymph node sampling): salpingo-oophorectomy Primary tumor site (including laterality): Left ovary Ovarian surface involvement: Yes Ovarian capsule intact without fragmentation: intact Maximum tumor size (cm): 20.0 cm Histologic type: Endometrioid carcinoma Grade: 1 Peritoneal implants: (specify invasive or non-invasive): Negative Pelvic extension (list additional structures on separate lines and if involved): Negative Lymph nodes: number examined 20 ; number positive 0 TNM code: pT1c2, pN0, pMx FIGO Stage (based on pathologic findings, needs clinical correlation): IC2      09/11/2017 Surgery    Preoperative Diagnosis: 1. left adnexal mass.  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   Procedure(s) Performed: 1. Exploratory laparotomy with bilateral salpingo-oophorectomy, pelvic and para-aortic lymph node dissection, omentectomy and radical debulking for ovarian cancer (CPT [phone])  Surgeon: Everitt Amber, M.D. Operative Findings:20 cm left ovarian mass.   No intraperitoneal rupture occurred;  Frozen pathology was consistent with adenocarcinoma; right tube and ovary normal in appearance; 3) normal bilateral pelvic and para-aortic lymph nodes and omentum; small and large bowel to palpation. This represented an optimal cytoreduction with no gross visible disease remaining.       She is recovering well except for mild incisional drainage.  She was placed on antibiotic therapy a week ago. She denies significant pain She had normal bowel habits She have lost almost 35 pounds since her diagnosis.  Her appetite is stable She had no recent nausea  MEDICAL HISTORY:  Past Medical History:  Diagnosis Date  . Anemia    hx of  . Arthritis    oa knees  . Breast lump in female    benign  . Fibroids   . Hyperlipidemia   . Hypertension   . Migraine    hx of   . Pelvic mass in female   .  Pelvic mass in female    . PONV (postoperative nausea and vomiting)    nausea only  . Sleep apnea     SURGICAL HISTORY: Past Surgical History:  Procedure Laterality Date  . 2 lumps right breast removed  yrs ago  . ABDOMINAL HYSTERECTOMY  2009   Total laparoscopic hyst for fibroids  . BILATERAL SALPINGECTOMY Bilateral 09/11/2017   Procedure: BILATERAL SALPINGECTOMY WITH OOPHERECTOMY;  Surgeon: Everitt Amber, MD;  Location: [facility];  Service: Gynecology;  Laterality: Bilateral;  . BREAST BIOPSY  12/16/2014   2 lumps in  right breast removed  . EXCISION OF SKIN TAG N/A 09/11/2017   Procedure: EXCISION OF SKIN TAG ON THIGH;  Surgeon: Everitt Amber, MD;  Location: [facility];  Service: Gynecology;  Laterality: N/A;  . KNEE SURGERY Right    x3  . LAPAROTOMY Bilateral 09/11/2017   Procedure: EXPLORATORY LAPAROTOMY;  Surgeon: Everitt Amber, MD;  Location: [facility];  Service: Gynecology;  Laterality: Bilateral;  . LAPAROTOMY WITH STAGING N/A 09/11/2017   Procedure: PELVIC AND PARA AORTIC LYMPH NODE  DISSECTION;  Surgeon: Everitt Amber, MD;  Location: [facility];  Service: Gynecology;  Laterality: N/A;  . OMENTECTOMY N/A 09/11/2017   Procedure: OMENTECTOMY;  Surgeon: Everitt Amber, MD;  Location: [facility];  Service: Gynecology;  Laterality: N/A;  . REDUCTION MAMMAPLASTY    . WISDOM TOOTH EXTRACTION      SOCIAL HISTORY: Social History   Social History  . Marital status: Single    Spouse name: N/A  . Number of children: N/A  . Years of education: N/A   Occupational History  . manager    Social History Main Topics  . Smoking status: Never Smoker  . Smokeless tobacco: Never Used  . Alcohol use Yes     Comment: Socially drinks wine/beer  . Drug use: No  . Sexual activity: No   Other Topics Concern  . Not on file   Social History Narrative  . No narrative on file    FAMILY HISTORY: Family History  Problem Relation Age of Onset  . Hypertension Mother   . Hyperlipidemia Mother   . Pancreatic cancer Father 102  .  Hypertension Father   . Cancer Father 16       pancreatic  . Breast cancer Maternal Aunt   . Heart disease Maternal Aunt   . Cancer Maternal Aunt 90       breast  . Hyperlipidemia Maternal Uncle   . Diabetes Maternal Uncle   . Stroke Maternal Uncle   . Osteoporosis Maternal Uncle   . Heart attack Maternal Grandmother   . Diabetes Maternal Grandfather   . Dementia Maternal Grandfather   . Osteoporosis Paternal Grandmother   . Colon cancer Paternal Grandfather   . Cancer Paternal Grandfather        ?colon ca    ALLERGIES:  is allergic to bee venom; chlorhexidine; other; bacitracin; and iodine.  MEDICATIONS:  Current Outpatient Prescriptions  Medication Sig Dispense Refill  . amLODipine (NORVASC) 5 MG tablet Take 5 mg by mouth every evening.    . Ascorbic Acid (VITAMIN C) 1000 MG tablet Take 1,000 mg by mouth every morning.     . cephALEXin (KEFLEX) 500 MG capsule Take 1 capsule (500 mg total) by mouth 4 (four) times daily. 28 capsule 1  . chlorthalidone (HYGROTON) 25 MG tablet Take 25 mg by mouth every evening.   3  . dexamethasone (DECADRON) 4 MG tablet Take 5 tabs the night before chemo and  5 tabs the morning of chemo, every 21 days, with food, by mouth 30 tablet 1  . enoxaparin (LOVENOX) 40 MG/0.4ML injection Inject 0.4 mLs (40 mg total) into the skin daily. 25 Syringe 0  . Liniments (SALONPAS PAIN RELIEF PATCH EX) Place 1 patch onto the skin daily as needed (knee pain).    Marland Kitchen loratadine (CLARITIN) 10 MG tablet Take 10 mg by mouth every morning.     . Melatonin 1 MG CAPS Take 1 mg by mouth at bedtime.    . Methylsulfonylmethane (MSM) 1000 MG CAPS Take 1,000 mg by mouth every morning.     . Multiple Vitamins-Minerals (HAIR/SKIN/NAILS) TABS Take 2 tablets by mouth every morning.    . ondansetron (ZOFRAN) 8 MG tablet Take 1 tablet (8 mg total) by mouth every 8 (eight) hours as needed for refractory nausea / vomiting. Start on day 3 after chemo. 60 tablet 1  . prochlorperazine  (COMPAZINE) 10 MG tablet Take 1 tablet (10 mg total) by mouth every 6 (six) hours as needed (Nausea or vomiting). 30 tablet 1   No current facility-administered medications for this visit.     REVIEW OF SYSTEMS:   Constitutional: Denies fevers, chills or abnormal night sweats Eyes: Denies blurriness of vision, double vision or watery eyes Ears, nose, mouth, throat, and face: Denies mucositis or sore throat Respiratory: Denies cough, dyspnea or wheezes Cardiovascular: Denies palpitation, chest discomfort or lower extremity swelling Gastrointestinal:  Denies nausea, heartburn or change in bowel habits Skin: Denies abnormal skin rashes.   Lymphatics: Denies new lymphadenopathy or easy bruising Neurological:Denies numbness, tingling or new weaknesses Behavioral/Psych: Mood is stable, no new changes  All other systems were reviewed with the patient and are negative.  PHYSICAL EXAMINATION: ECOG PERFORMANCE STATUS: 1 - Symptomatic but completely ambulatory  Vitals:   10/07/17 1131  BP: 128/81  Pulse: 85  Resp: 20  Temp: 98.7 F (37.1 C)  SpO2: 100%   Filed Weights   10/07/17 1131  Weight: 175 lb 8 oz (79.6 kg)    GENERAL:alert, no distress and comfortable.  She is mildly overweight SKIN: skin color, texture, turgor are normal, no rashes or significant lesions EYES: normal, conjunctiva are pink and non-injected, sclera clear OROPHARYNX:no exudate, no erythema and lips, buccal mucosa, and tongue normal  NECK: supple, thyroid normal size, non-tender, without nodularity LYMPH:  no palpable lymphadenopathy in the cervical, axillary or inguinal LUNGS: clear to auscultation and percussion with normal breathing effort HEART: regular rate & rhythm and no murmurs and no lower extremity edema ABDOMEN:abdomen soft, non-tender and normal bowel sounds.  Noted mild incisional wound drainage.  I placed some clean dry dressing changes over Musculoskeletal:no cyanosis of digits and no clubbing   PSYCH: alert & oriented x 3 with fluent speech NEURO: no focal motor/sensory deficits  LABORATORY DATA:  I have reviewed the data as listed Lab Results  Component Value Date   WBC 15.6 (H) 09/12/2017   HGB 10.5 (L) 09/12/2017   HCT 32.3 (L) 09/12/2017   MCV 82.4 09/12/2017   PLT 366 09/12/2017    Recent Labs  08/27/17 1048 09/12/17 0444  NA 139 138  K 3.5 3.3*  CL  --  102  CO2 29 27  GLUCOSE 109 138*  BUN 10.8 11  CREATININE 0.9 0.86  CALCIUM 11.3* 8.7*  GFRNONAA  --  >60  GFRAA  --  >60  PROT 8.8*  --   ALBUMIN 4.2  --   AST 15  --  ALT 8  --   ALKPHOS 64  --   BILITOT 0.36  --     RADIOGRAPHIC STUDIES: I have reviewed outside imaging study from 08/19/17   ASSESSMENT & PLAN:  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 The intent is of curative intent.  We discussed some of the risks, benefits, side-effects of carboplatin & Taxol. Treatment is intravenous, every 3 weeks x 6 cycles  Some of the short term side-effects included, though not limited to, including weight loss, life threatening infections, risk of allergic reactions, need for transfusions of blood products, nausea, vomiting, change in bowel habits, loss of hair, admission to hospital for various reasons, and risks of death.   Long term side-effects are also discussed including risks of infertility, permanent damage to nerve function, hearing loss, chronic fatigue, kidney damage with possibility needing hemodialysis, and rare secondary malignancy including bone marrow disorders.  The patient is aware that the response rates discussed earlier is not guaranteed.  After a long discussion, patient made an informed decision to proceed with the prescribed plan of care.   Patient education material was dispensed. I will schedule chemo education class and blood work We discussed premedication with dexamethasone before chemotherapy. We  discussed port placement but the patient declined I will get her scheduled for chemotherapy next week, as soon as possible I will see her back prior to cycle 2 of therapy I told her to discontinue tumeric and apple cider vinegar due to potential at direction with chemotherapy We discussed significantly about the logistics of chemotherapy scheduling, etc. and the timing of treatment I highly recommend the patient take extended leave but due to her insurance situation, she may have to work part-time She will get back to me after she talked to her supervisor related to work We also discussed referral to Dietitian and she agreed    Open abdominal incision with drainage She is noted to have mild incisional wound drainage Clinically, it does not appear infected She is completing a course of antibiotic treatment I recommend dry dressing changes I felt that it should not delay to start of chemotherapy next week  Orders Placed This Encounter  Procedures  . CBC with Differential    Standing Status:   Standing    Number of Occurrences:   20    Standing Expiration Date:   10/08/2018  . Comprehensive metabolic panel    Standing Status:   Standing    Number of Occurrences:   20    Standing Expiration Date:   10/08/2018  . CA 125    Standing Status:   Standing    Number of Occurrences:   9    Standing Expiration Date:   10/08/2018  . Ambulatory referral to Genetics    Referral Priority:   Routine    Referral Type:   Consultation    Referral Reason:   Specialty Services Required    Number of Visits Requested:   1     All questions were answered. The patient knows to call the clinic with any problems, questions or concerns. I spent 60 minutes counseling the patient face to face. The total time spent in the appointment was 90 minutes and more than 50% was on counseling.     Heath Lark, MD 10/08/2017 8:45 AM

## 2017-10-09 ENCOUNTER — Other Ambulatory Visit (HOSPITAL_BASED_OUTPATIENT_CLINIC_OR_DEPARTMENT_OTHER): Payer: BLUE CROSS/BLUE SHIELD

## 2017-10-09 ENCOUNTER — Other Ambulatory Visit: Payer: BLUE CROSS/BLUE SHIELD

## 2017-10-09 ENCOUNTER — Encounter: Payer: Self-pay | Admitting: *Deleted

## 2017-10-09 ENCOUNTER — Other Ambulatory Visit: Payer: Self-pay | Admitting: Hematology and Oncology

## 2017-10-09 ENCOUNTER — Telehealth: Payer: Self-pay | Admitting: Hematology and Oncology

## 2017-10-09 DIAGNOSIS — C562 Malignant neoplasm of left ovary: Secondary | ICD-10-CM | POA: Diagnosis not present

## 2017-10-09 LAB — COMPREHENSIVE METABOLIC PANEL
ALT: 21 U/L (ref 0–55)
ANION GAP: 12 meq/L — AB (ref 3–11)
AST: 19 U/L (ref 5–34)
Albumin: 4.1 g/dL (ref 3.5–5.0)
Alkaline Phosphatase: 55 U/L (ref 40–150)
BUN: 11.9 mg/dL (ref 7.0–26.0)
CHLORIDE: 101 meq/L (ref 98–109)
CO2: 27 meq/L (ref 22–29)
CREATININE: 0.9 mg/dL (ref 0.6–1.1)
Calcium: 10.3 mg/dL (ref 8.4–10.4)
GLUCOSE: 108 mg/dL (ref 70–140)
Potassium: 3.7 mEq/L (ref 3.5–5.1)
SODIUM: 139 meq/L (ref 136–145)
TOTAL PROTEIN: 7.6 g/dL (ref 6.4–8.3)
Total Bilirubin: 0.6 mg/dL (ref 0.20–1.20)

## 2017-10-09 LAB — CBC WITH DIFFERENTIAL/PLATELET
BASO%: 0.9 % (ref 0.0–2.0)
BASOS ABS: 0.1 10*3/uL (ref 0.0–0.1)
EOS ABS: 1.6 10*3/uL — AB (ref 0.0–0.5)
EOS%: 17.7 % — ABNORMAL HIGH (ref 0.0–7.0)
HCT: 39 % (ref 34.8–46.6)
HGB: 12.4 g/dL (ref 11.6–15.9)
LYMPH#: 1.7 10*3/uL (ref 0.9–3.3)
LYMPH%: 18.8 % (ref 14.0–49.7)
MCH: 26.6 pg (ref 25.1–34.0)
MCHC: 31.8 g/dL (ref 31.5–36.0)
MCV: 83.5 fL (ref 79.5–101.0)
MONO#: 0.8 10*3/uL (ref 0.1–0.9)
MONO%: 8.7 % (ref 0.0–14.0)
NEUT%: 53.9 % (ref 38.4–76.8)
NEUTROS ABS: 4.7 10*3/uL (ref 1.5–6.5)
PLATELETS: 379 10*3/uL (ref 145–400)
RBC: 4.67 10*6/uL (ref 3.70–5.45)
RDW: 15.7 % — ABNORMAL HIGH (ref 11.2–14.5)
WBC: 8.8 10*3/uL (ref 3.9–10.3)

## 2017-10-09 NOTE — Telephone Encounter (Signed)
Scheduled appt for 11/7 okay per Jenny Reichmann - gave patient updated calender while she was here for lab and chemo edu today.

## 2017-10-10 ENCOUNTER — Other Ambulatory Visit: Payer: Self-pay | Admitting: Hematology and Oncology

## 2017-10-10 LAB — CA 125: CANCER ANTIGEN (CA) 125: 68.7 U/mL — AB (ref 0.0–38.1)

## 2017-10-13 ENCOUNTER — Ambulatory Visit: Payer: BLUE CROSS/BLUE SHIELD | Admitting: Gynecologic Oncology

## 2017-10-15 ENCOUNTER — Ambulatory Visit (HOSPITAL_BASED_OUTPATIENT_CLINIC_OR_DEPARTMENT_OTHER): Payer: BLUE CROSS/BLUE SHIELD

## 2017-10-15 ENCOUNTER — Ambulatory Visit (HOSPITAL_BASED_OUTPATIENT_CLINIC_OR_DEPARTMENT_OTHER): Payer: BLUE CROSS/BLUE SHIELD | Admitting: Medical

## 2017-10-15 VITALS — BP 121/69 | HR 94 | Temp 98.1°F | Resp 16

## 2017-10-15 DIAGNOSIS — C562 Malignant neoplasm of left ovary: Secondary | ICD-10-CM | POA: Diagnosis not present

## 2017-10-15 DIAGNOSIS — Z5111 Encounter for antineoplastic chemotherapy: Secondary | ICD-10-CM | POA: Diagnosis not present

## 2017-10-15 DIAGNOSIS — T451X5A Adverse effect of antineoplastic and immunosuppressive drugs, initial encounter: Secondary | ICD-10-CM | POA: Diagnosis not present

## 2017-10-15 MED ORDER — SODIUM CHLORIDE 0.9 % IV SOLN
682.8000 mg | Freq: Once | INTRAVENOUS | Status: AC
  Administered 2017-10-15: 680 mg via INTRAVENOUS
  Filled 2017-10-15: qty 68

## 2017-10-15 MED ORDER — SODIUM CHLORIDE 0.9 % IV SOLN
Freq: Once | INTRAVENOUS | Status: AC
  Administered 2017-10-15: 10:00:00 via INTRAVENOUS
  Filled 2017-10-15: qty 5

## 2017-10-15 MED ORDER — PALONOSETRON HCL INJECTION 0.25 MG/5ML
INTRAVENOUS | Status: AC
  Filled 2017-10-15: qty 5

## 2017-10-15 MED ORDER — PALONOSETRON HCL INJECTION 0.25 MG/5ML
0.2500 mg | Freq: Once | INTRAVENOUS | Status: AC
  Administered 2017-10-15: 0.25 mg via INTRAVENOUS

## 2017-10-15 MED ORDER — DIPHENHYDRAMINE HCL 50 MG/ML IJ SOLN
50.0000 mg | Freq: Once | INTRAMUSCULAR | Status: AC
  Administered 2017-10-15: 50 mg via INTRAVENOUS

## 2017-10-15 MED ORDER — FAMOTIDINE IN NACL 20-0.9 MG/50ML-% IV SOLN
20.0000 mg | Freq: Once | INTRAVENOUS | Status: AC
  Administered 2017-10-15: 20 mg via INTRAVENOUS

## 2017-10-15 MED ORDER — METHYLPREDNISOLONE SODIUM SUCC 125 MG IJ SOLR
125.0000 mg | Freq: Once | INTRAMUSCULAR | Status: AC | PRN
  Administered 2017-10-15: 125 mg via INTRAVENOUS

## 2017-10-15 MED ORDER — SODIUM CHLORIDE 0.9 % IV SOLN
Freq: Once | INTRAVENOUS | Status: AC
  Administered 2017-10-15: 09:00:00 via INTRAVENOUS

## 2017-10-15 MED ORDER — DIPHENHYDRAMINE HCL 50 MG/ML IJ SOLN
INTRAMUSCULAR | Status: AC
  Filled 2017-10-15: qty 1

## 2017-10-15 MED ORDER — DIPHENHYDRAMINE HCL 50 MG/ML IJ SOLN
50.0000 mg | Freq: Once | INTRAMUSCULAR | Status: AC | PRN
  Administered 2017-10-15: 50 mg via INTRAVENOUS

## 2017-10-15 MED ORDER — FAMOTIDINE IN NACL 20-0.9 MG/50ML-% IV SOLN
20.0000 mg | Freq: Once | INTRAVENOUS | Status: AC | PRN
  Administered 2017-10-15: 20 mg via INTRAVENOUS

## 2017-10-15 MED ORDER — SODIUM CHLORIDE 0.9 % IV SOLN
175.0000 mg/m2 | Freq: Once | INTRAVENOUS | Status: AC
  Administered 2017-10-15: 330 mg via INTRAVENOUS
  Filled 2017-10-15: qty 55

## 2017-10-15 MED ORDER — FAMOTIDINE IN NACL 20-0.9 MG/50ML-% IV SOLN
INTRAVENOUS | Status: AC
  Filled 2017-10-15: qty 50

## 2017-10-15 NOTE — Progress Notes (Signed)
[facility] Progress Note   Sierra Gray [id number] 04/26/1961 56 y.o.  Sierra Gray is managed by Dr. Heath Lark  Actively treated with chemotherapy: yes  Current Therapy: Carboplatin and Taxol   Last Treated: 10/15/2017  Assessment: Plan:    Chemotherapy adverse reaction, initial encounter  Sierra Gray was seen in the infusion room for a suspected chemotherapy reaction. She was receiving Taxol at the time of her reaction. She had received a total of approximately 1 mL prior to onset of symptoms. Her symptoms included: Flushing, facial, neck, and chest erythema, back pain, and chest tightness. She was premedicated with Decadron 5 mg the evening before and morning of chemo, Benadryl 50 mg, Emend/Decadron, Aloxi, and Pepcid prior to starting chemotherapy. Taxol was paused and AMEAH CHANDA was given Solu-Medrol 125 mg and Pepcid 25 mg IV after onset of her symptoms. [name] did  respond to intervention.  Her symptoms of flushing, facial, neck, and chest erythema, back pain, and chest tightness resolved. This case was discussed with Dr. Heath Lark, who directed that the following be done: Wait 1 hour after the last dose of steroids were given and restart Taxol at a lower dose.  The patient will be transitioned to Abraxane if she has a second chemotherapy infusion reaction to Taxol.  The patient is approved to receive carboplatin today even should she have another reaction to Taxol.  Taxol was restarted as directed above.  The patient had received around 45 minutes of her infusion when she had a recurrence of flushing and facial, neck, and chest erythema.  Taxol was discontinued.  The patient was given 50 mg of Benadryl IV with resolution of her symptoms.  After 1 hour carboplatin was started.  The patient tolerated this without any additional chemotherapy reactions.  Please see After Visit Summary for patient specific instructions.  Future  Appointments  Date Time Provider [place]  11/05/2017  8:00 AM [facility] LAB 3 [facility] None  11/05/2017  8:30 AM Gorsuch, Ni, MD [facility] None  11/05/2017  9:30 AM [facility] E16 [facility] None    No orders of the defined types were placed in this encounter.      Subjective:   Patient ID:  Sierra Gray is a 56 y.o. (DOB [birth year]) female.  Chief Complaint: No chief complaint on file.   HPI Sierra Gray was seen in the infusion room for a suspected reaction to chemotherapy. She was receiving Taxol at the time of her reaction. She had received a total of approximately 1 mL prior to onset of symptoms. Her symptoms included: Flushing, facial, neck, and chest erythema, back pain, and chest tightness. She was premedicated with Decadron 5 mg the evening before and morning of chemo, Benadryl 50 mg, Emend/Decadron, Aloxi, and Pepcid prior to starting chemotherapy. Taxol was paused and Sierra Gray was given Solu-Medrol 125 mg and Pepcid 25 mg IV after onset of her symptoms. [name] did  respond to intervention.  Her symptoms of flushing, facial, neck, and chest erythema, back pain, and chest tightness resolved. This case was discussed with Dr. Heath Lark, who directed that the following be done: Wait 1 hour after the last dose of steroids were given and restart Taxol at a lower dose.  The patient will be transitioned to Abraxane if she has a second chemotherapy infusion reaction to Taxol.  The patient is approved to receive carboplatin today even should she have another reaction to Taxol. Taxol  was restarted as directed above.  The patient had received around 45 minutes of her infusion when she had a recurrence of flushing and facial, neck, and chest erythema.  Taxol was discontinued.  The patient was given 50 mg of Benadryl IV with resolution of her symptoms.  After 1 hour carboplatin was started.  The patient tolerated this without any additional  chemotherapy reactions.  Medications: I have reviewed the patient's current medications.  Allergies:  Allergies  Allergen Reactions  . Bee Venom Anaphylaxis  . Chlorhexidine Rash    Surgical prep wash  . Other     Ortho prep  . Bacitracin Rash  . Iodine Rash    Past Medical History:  Diagnosis Date  . Anemia    hx of  . Arthritis    oa knees  . Breast lump in female    benign  . Fibroids   . Hyperlipidemia   . Hypertension   . Migraine    hx of   . Pelvic mass in female   . Pelvic mass in female   . PONV (postoperative nausea and vomiting)    nausea only  . Sleep apnea     Past Surgical History:  Procedure Laterality Date  . 2 lumps right breast removed  yrs ago  . ABDOMINAL HYSTERECTOMY  2009   Total laparoscopic hyst for fibroids  . BREAST BIOPSY  12/16/2014   2 lumps in  right breast removed  . KNEE SURGERY Right    x3  . REDUCTION MAMMAPLASTY    . WISDOM TOOTH EXTRACTION      Family History  Problem Relation Age of Onset  . Hypertension Mother   . Hyperlipidemia Mother   . Pancreatic cancer Father 78  . Hypertension Father   . Cancer Father 9       pancreatic  . Breast cancer Maternal Aunt   . Heart disease Maternal Aunt   . Cancer Maternal Aunt 81       breast  . Hyperlipidemia Maternal Uncle   . Diabetes Maternal Uncle   . Stroke Maternal Uncle   . Osteoporosis Maternal Uncle   . Heart attack Maternal Grandmother   . Diabetes Maternal Grandfather   . Dementia Maternal Grandfather   . Osteoporosis Paternal Grandmother   . Colon cancer Paternal Grandfather   . Cancer Paternal Grandfather        ?colon ca    Social History   Socioeconomic History  . Marital status: Single    Spouse name: Not on file  . Number of children: Not on file  . Years of education: Not on file  . Highest education level: Not on file  Social Needs  . Financial resource strain: Not on file  . Food insecurity - worry: Not on file  . Food insecurity -  inability: Not on file  . Transportation needs - medical: Not on file  . Transportation needs - non-medical: Not on file  Occupational History  . Occupation: Freight forwarder  Tobacco Use  . Smoking status: Never Smoker  . Smokeless tobacco: Never Used  Substance and Sexual Activity  . Alcohol use: Yes    Comment: Socially drinks wine/beer  . Drug use: No  . Sexual activity: No  Other Topics Concern  . Not on file  Social History Narrative  . Not on file    Past Medical History, Surgical history, Social history, and Family history were reviewed and updated as appropriate.   Please see review of systems for  further details on the patient's review from today.   Review of Systems:  Review of Systems  Constitutional: Negative for diaphoresis.  Respiratory: Positive for chest tightness. Negative for cough, choking and shortness of breath.   Cardiovascular: Negative for chest pain.  Musculoskeletal: Positive for back pain.  Skin:       Erythema of the face, neck, and chest.    Objective:   Physical Exam:  There were no vitals taken for this visit. ECOG: 0  Physical Exam  Constitutional: No distress.  HENT:  Head: Normocephalic and atraumatic.  Erythema was noted over the face, neck, and upper chest.  Cardiovascular: Normal rate, regular rhythm and normal heart sounds. Exam reveals no gallop and no friction rub.  No murmur heard. Pulmonary/Chest: Effort normal and breath sounds normal. No respiratory distress. She has no wheezes. She has no rales.  Musculoskeletal: She exhibits no edema.  Neurological: She is alert.  Skin: Skin is warm and dry. No rash noted. She is not diaphoretic. There is erythema.    Lab Review:     Component Value Date/Time   NA 139 10/09/2017 0921   K 3.7 10/09/2017 0921   CL 102 09/12/2017 0444   CO2 27 10/09/2017 0921   GLUCOSE 108 10/09/2017 0921   BUN 11.9 10/09/2017 0921   CREATININE 0.9 10/09/2017 0921   CALCIUM 10.3 10/09/2017 0921   PROT  7.6 10/09/2017 0921   ALBUMIN 4.1 10/09/2017 0921   AST 19 10/09/2017 0921   ALT 21 10/09/2017 0921   ALKPHOS 55 10/09/2017 0921   BILITOT 0.60 10/09/2017 0921   GFRNONAA >60 09/12/2017 0444   GFRAA >60 09/12/2017 0444       Component Value Date/Time   WBC 8.8 10/09/2017 0921   WBC 15.6 (H) 09/12/2017 0444   RBC 4.67 10/09/2017 0921   RBC 3.92 09/12/2017 0444   HGB 12.4 10/09/2017 0921   HCT 39.0 10/09/2017 0921   PLT 379 10/09/2017 0921   MCV 83.5 10/09/2017 0921   MCH 26.6 10/09/2017 0921   MCH 26.8 09/12/2017 0444   MCHC 31.8 10/09/2017 0921   MCHC 32.5 09/12/2017 0444   RDW 15.7 (H) 10/09/2017 0921   LYMPHSABS 1.7 10/09/2017 0921   MONOABS 0.8 10/09/2017 0921   EOSABS 1.6 (H) 10/09/2017 0921   BASOSABS 0.1 10/09/2017 0921   -------------------------------  Imaging from last 24 hours (if applicable):  Radiology interpretation: No results found.

## 2017-10-15 NOTE — Progress Notes (Signed)
During Taxol infusion, patient c/o "not feeling right" and lower back pain. Noted profound erythema to chest and face. Infusion interrupted, hypersensitivity protocol initiated, and Sandi Mealy, PA-C called to chair side. Medicated as documented in [facility]. Sandi Mealy notified Dr. Alvy Bimler of reaction.  Advised to resume infusion in approximately 1 hour. Patient returned to baseline and rechallenge initiated @ 50% of initial rate x 30 mins. Patient tolerated well with no reactions noted. After first titration, patient c/o flushing and "not feeling right." Taxol infusion stopped. Sandi Mealy at chair side when symptoms began. Medicated as documented in [facility]. Patient quickly returned to baseline. Dr. Alvy Bimler advised to not proceed with Taxol. After monitoring, carboplatin initiated and completed as documented in [facility]. No additional complaints voiced. Discharged in stable condition with no complaints.

## 2017-10-15 NOTE — Patient Instructions (Addendum)
[facility] Discharge Instructions for Patients Receiving Chemotherapy  Today you received the following chemotherapy agents Taxol and Carboplatin.   To help prevent nausea and vomiting after your treatment, we encourage you to take your nausea medication as directed.   If you develop nausea and vomiting that is not controlled by your nausea medication, call the clinic.   BELOW ARE SYMPTOMS THAT SHOULD BE REPORTED IMMEDIATELY:  *FEVER GREATER THAN 100.5 F  *CHILLS WITH OR WITHOUT FEVER  NAUSEA AND VOMITING THAT IS NOT CONTROLLED WITH YOUR NAUSEA MEDICATION  *UNUSUAL SHORTNESS OF BREATH  *UNUSUAL BRUISING OR BLEEDING  TENDERNESS IN MOUTH AND THROAT WITH OR WITHOUT PRESENCE OF ULCERS  *URINARY PROBLEMS  *BOWEL PROBLEMS  UNUSUAL RASH Items with * indicate a potential emergency and should be followed up as soon as possible.  Feel free to call the clinic should you have any questions or concerns. The clinic phone number is [phone].  Please show the CHEMO ALERT CARD at check-in to the Emergency Department and triage nurse.   Paclitaxel injection What is this medicine? PACLITAXEL (PAK li TAX el) is a chemotherapy drug. It targets fast dividing cells, like cancer cells, and causes these cells to die. This medicine is used to treat ovarian cancer, breast cancer, and other cancers. This medicine may be used for other purposes; ask your health care provider or pharmacist if you have questions. COMMON BRAND NAME(S): Onxol, Taxol What should I tell my health care provider before I take this medicine? They need to know if you have any of these conditions: -blood disorders -irregular heartbeat -infection (especially a virus infection such as chickenpox, cold sores, or herpes) -liver disease -previous or ongoing radiation therapy -an unusual or allergic reaction to paclitaxel, alcohol, polyoxyethylated castor oil, other chemotherapy agents, other medicines, foods,  dyes, or preservatives -pregnant or trying to get pregnant -breast-feeding How should I use this medicine? This drug is given as an infusion into a vein. It is administered in a hospital or clinic by a specially trained health care professional. Talk to your pediatrician regarding the use of this medicine in children. Special care may be needed. Overdosage: If you think you have taken too much of this medicine contact a poison control center or emergency room at once. NOTE: This medicine is only for you. Do not share this medicine with others. What if I miss a dose? It is important not to miss your dose. Call your doctor or health care professional if you are unable to keep an appointment. What may interact with this medicine? Do not take this medicine with any of the following medications: -disulfiram -metronidazole This medicine may also interact with the following medications: -cyclosporine -diazepam -ketoconazole -medicines to increase blood counts like filgrastim, pegfilgrastim, sargramostim -other chemotherapy drugs like cisplatin, doxorubicin, epirubicin, etoposide, teniposide, vincristine -quinidine -testosterone -vaccines -verapamil Talk to your doctor or health care professional before taking any of these medicines: -acetaminophen -aspirin -ibuprofen -ketoprofen -naproxen This list may not describe all possible interactions. Give your health care provider a list of all the medicines, herbs, non-prescription drugs, or dietary supplements you use. Also tell them if you smoke, drink alcohol, or use illegal drugs. Some items may interact with your medicine. What should I watch for while using this medicine? Your condition will be monitored carefully while you are receiving this medicine. You will need important blood work done while you are taking this medicine. This medicine can cause serious allergic reactions. To reduce your risk you   will need to take other medicine(s)  before treatment with this medicine. If you experience allergic reactions like skin rash, itching or hives, swelling of the face, lips, or tongue, tell your doctor or health care professional right away. In some cases, you may be given additional medicines to help with side effects. Follow all directions for their use. This drug may make you feel generally unwell. This is not uncommon, as chemotherapy can affect healthy cells as well as cancer cells. Report any side effects. Continue your course of treatment even though you feel ill unless your doctor tells you to stop. Call your doctor or health care professional for advice if you get a fever, chills or sore throat, or other symptoms of a cold or flu. Do not treat yourself. This drug decreases your body's ability to fight infections. Try to avoid being around people who are sick. This medicine may increase your risk to bruise or bleed. Call your doctor or health care professional if you notice any unusual bleeding. Be careful brushing and flossing your teeth or using a toothpick because you may get an infection or bleed more easily. If you have any dental work done, tell your dentist you are receiving this medicine. Avoid taking products that contain aspirin, acetaminophen, ibuprofen, naproxen, or ketoprofen unless instructed by your doctor. These medicines may hide a fever. Do not become pregnant while taking this medicine. Women should inform their doctor if they wish to become pregnant or think they might be pregnant. There is a potential for serious side effects to an unborn child. Talk to your health care professional or pharmacist for more information. Do not breast-feed an infant while taking this medicine. Men are advised not to father a child while receiving this medicine. This product may contain alcohol. Ask your pharmacist or healthcare provider if this medicine contains alcohol. Be sure to tell all healthcare providers you are taking this  medicine. Certain medicines, like metronidazole and disulfiram, can cause an unpleasant reaction when taken with alcohol. The reaction includes flushing, headache, nausea, vomiting, sweating, and increased thirst. The reaction can last from 30 minutes to several hours. What side effects may I notice from receiving this medicine? Side effects that you should report to your doctor or health care professional as soon as possible: -allergic reactions like skin rash, itching or hives, swelling of the face, lips, or tongue -low blood counts - This drug may decrease the number of white blood cells, red blood cells and platelets. You may be at increased risk for infections and bleeding. -signs of infection - fever or chills, cough, sore throat, pain or difficulty passing urine -signs of decreased platelets or bleeding - bruising, pinpoint red spots on the skin, black, tarry stools, nosebleeds -signs of decreased red blood cells - unusually weak or tired, fainting spells, lightheadedness -breathing problems -chest pain -high or low blood pressure -mouth sores -nausea and vomiting -pain, swelling, redness or irritation at the injection site -pain, tingling, numbness in the hands or feet -slow or irregular heartbeat -swelling of the ankle, feet, hands Side effects that usually do not require medical attention (report to your doctor or health care professional if they continue or are bothersome): -bone pain -complete hair loss including hair on your head, underarms, pubic hair, eyebrows, and eyelashes -changes in the color of fingernails -diarrhea -loosening of the fingernails -loss of appetite -muscle or joint pain -red flush to skin -sweating This list may not describe all possible side effects. Call your doctor for   medical advice about side effects. You may report side effects to FDA at 1-800-FDA-1088. Where should I keep my medicine? This drug is given in a hospital or clinic and will not be  stored at home. NOTE: This sheet is a summary. It may not cover all possible information. If you have questions about this medicine, talk to your doctor, pharmacist, or health care provider.  2018 Elsevier/Gold Standard (2015-09-26 19:58:00)   Carboplatin injection What is this medicine? CARBOPLATIN (KAR boe pla tin) is a chemotherapy drug. It targets fast dividing cells, like cancer cells, and causes these cells to die. This medicine is used to treat ovarian cancer and many other cancers. This medicine may be used for other purposes; ask your health care provider or pharmacist if you have questions. COMMON BRAND NAME(S): Paraplatin What should I tell my health care provider before I take this medicine? They need to know if you have any of these conditions: -blood disorders -hearing problems -kidney disease -recent or ongoing radiation therapy -an unusual or allergic reaction to carboplatin, cisplatin, other chemotherapy, other medicines, foods, dyes, or preservatives -pregnant or trying to get pregnant -breast-feeding How should I use this medicine? This drug is usually given as an infusion into a vein. It is administered in a hospital or clinic by a specially trained health care professional. Talk to your pediatrician regarding the use of this medicine in children. Special care may be needed. Overdosage: If you think you have taken too much of this medicine contact a poison control center or emergency room at once. NOTE: This medicine is only for you. Do not share this medicine with others. What if I miss a dose? It is important not to miss a dose. Call your doctor or health care professional if you are unable to keep an appointment. What may interact with this medicine? -medicines for seizures -medicines to increase blood counts like filgrastim, pegfilgrastim, sargramostim -some antibiotics like amikacin, gentamicin, neomycin, streptomycin, tobramycin -vaccines Talk to your doctor  or health care professional before taking any of these medicines: -acetaminophen -aspirin -ibuprofen -ketoprofen -naproxen This list may not describe all possible interactions. Give your health care provider a list of all the medicines, herbs, non-prescription drugs, or dietary supplements you use. Also tell them if you smoke, drink alcohol, or use illegal drugs. Some items may interact with your medicine. What should I watch for while using this medicine? Your condition will be monitored carefully while you are receiving this medicine. You will need important blood work done while you are taking this medicine. This drug may make you feel generally unwell. This is not uncommon, as chemotherapy can affect healthy cells as well as cancer cells. Report any side effects. Continue your course of treatment even though you feel ill unless your doctor tells you to stop. In some cases, you may be given additional medicines to help with side effects. Follow all directions for their use. Call your doctor or health care professional for advice if you get a fever, chills or sore throat, or other symptoms of a cold or flu. Do not treat yourself. This drug decreases your body's ability to fight infections. Try to avoid being around people who are sick. This medicine may increase your risk to bruise or bleed. Call your doctor or health care professional if you notice any unusual bleeding. Be careful brushing and flossing your teeth or using a toothpick because you may get an infection or bleed more easily. If you have any dental work done,   tell your dentist you are receiving this medicine. Avoid taking products that contain aspirin, acetaminophen, ibuprofen, naproxen, or ketoprofen unless instructed by your doctor. These medicines may hide a fever. Do not become pregnant while taking this medicine. Women should inform their doctor if they wish to become pregnant or think they might be pregnant. There is a potential  for serious side effects to an unborn child. Talk to your health care professional or pharmacist for more information. Do not breast-feed an infant while taking this medicine. What side effects may I notice from receiving this medicine? Side effects that you should report to your doctor or health care professional as soon as possible: -allergic reactions like skin rash, itching or hives, swelling of the face, lips, or tongue -signs of infection - fever or chills, cough, sore throat, pain or difficulty passing urine -signs of decreased platelets or bleeding - bruising, pinpoint red spots on the skin, black, tarry stools, nosebleeds -signs of decreased red blood cells - unusually weak or tired, fainting spells, lightheadedness -breathing problems -changes in hearing -changes in vision -chest pain -high blood pressure -low blood counts - This drug may decrease the number of white blood cells, red blood cells and platelets. You may be at increased risk for infections and bleeding. -nausea and vomiting -pain, swelling, redness or irritation at the injection site -pain, tingling, numbness in the hands or feet -problems with balance, talking, walking -trouble passing urine or change in the amount of urine Side effects that usually do not require medical attention (report to your doctor or health care professional if they continue or are bothersome): -hair loss -loss of appetite -metallic taste in the mouth or changes in taste This list may not describe all possible side effects. Call your doctor for medical advice about side effects. You may report side effects to FDA at 1-800-FDA-1088. Where should I keep my medicine? This drug is given in a hospital or clinic and will not be stored at home. NOTE: This sheet is a summary. It may not cover all possible information. If you have questions about this medicine, talk to your doctor, pharmacist, or health care provider.  2018 Elsevier/Gold Standard  (2008-03-01 14:38:05)  

## 2017-10-16 ENCOUNTER — Telehealth: Payer: Self-pay

## 2017-10-16 ENCOUNTER — Other Ambulatory Visit: Payer: Self-pay | Admitting: Hematology and Oncology

## 2017-10-16 ENCOUNTER — Other Ambulatory Visit: Payer: Self-pay | Admitting: Internal Medicine

## 2017-10-16 DIAGNOSIS — R5381 Other malaise: Secondary | ICD-10-CM

## 2017-10-16 DIAGNOSIS — C562 Malignant neoplasm of left ovary: Secondary | ICD-10-CM

## 2017-10-16 NOTE — Telephone Encounter (Signed)
-----   Message from Sinda Du, RN sent at 10/15/2017  9:42 AM EST ----- Regarding: Dr. Alvy Bimler - 1st chemo f/u First chemo f/u

## 2017-10-16 NOTE — Telephone Encounter (Signed)
Called for follow up after first treatment yesterday. She is doing well with no problems.  Requesting port placement to make things a little easier.

## 2017-10-16 NOTE — Telephone Encounter (Signed)
Order for port is placed IR will call her directly

## 2017-10-21 ENCOUNTER — Other Ambulatory Visit: Payer: Self-pay | Admitting: Radiology

## 2017-10-21 ENCOUNTER — Telehealth: Payer: Self-pay

## 2017-10-21 NOTE — Telephone Encounter (Signed)
I have not seen it yet Will get that completed as soon as I have it

## 2017-10-21 NOTE — Telephone Encounter (Signed)
Pt called that her work is faxing FMLA forms and possibly short term disability forms.

## 2017-10-22 ENCOUNTER — Emergency Department (HOSPITAL_COMMUNITY): Payer: BLUE CROSS/BLUE SHIELD

## 2017-10-22 ENCOUNTER — Telehealth: Payer: Self-pay

## 2017-10-22 ENCOUNTER — Encounter (HOSPITAL_COMMUNITY): Payer: Self-pay | Admitting: *Deleted

## 2017-10-22 ENCOUNTER — Emergency Department (HOSPITAL_COMMUNITY)
Admission: EM | Admit: 2017-10-22 | Discharge: 2017-10-22 | Disposition: A | Discharge status: Home / Self Care | Payer: BLUE CROSS/BLUE SHIELD | Attending: Emergency Medicine | Admitting: Emergency Medicine

## 2017-10-22 DIAGNOSIS — R509 Fever, unspecified: Secondary | ICD-10-CM | POA: Diagnosis present

## 2017-10-22 DIAGNOSIS — I10 Essential (primary) hypertension: Secondary | ICD-10-CM | POA: Diagnosis not present

## 2017-10-22 DIAGNOSIS — Z79899 Other long term (current) drug therapy: Secondary | ICD-10-CM | POA: Insufficient documentation

## 2017-10-22 LAB — URINALYSIS, ROUTINE W REFLEX MICROSCOPIC
BACTERIA UA: NONE SEEN
Bilirubin Urine: NEGATIVE
Glucose, UA: NEGATIVE mg/dL
HGB URINE DIPSTICK: NEGATIVE
KETONES UR: 5 mg/dL — AB
Nitrite: NEGATIVE
PROTEIN: 100 mg/dL — AB
Specific Gravity, Urine: 1.02 (ref 1.005–1.030)
pH: 6 (ref 5.0–8.0)

## 2017-10-22 LAB — COMPREHENSIVE METABOLIC PANEL
ALBUMIN: 3.5 g/dL (ref 3.5–5.0)
ALK PHOS: 84 U/L (ref 38–126)
ALT: 25 U/L (ref 14–54)
ANION GAP: 13 (ref 5–15)
AST: 20 U/L (ref 15–41)
BILIRUBIN TOTAL: 1.4 mg/dL — AB (ref 0.3–1.2)
BUN: 12 mg/dL (ref 6–20)
CALCIUM: 9.1 mg/dL (ref 8.9–10.3)
CO2: 21 mmol/L — AB (ref 22–32)
Chloride: 100 mmol/L — ABNORMAL LOW (ref 101–111)
Creatinine, Ser: 0.98 mg/dL (ref 0.44–1.00)
GFR calc Af Amer: >60 mL/min (ref >60)
GFR calc non Af Amer: >60 mL/min (ref >60)
GLUCOSE: 117 mg/dL — AB (ref 65–99)
Potassium: 3.6 mmol/L (ref 3.5–5.1)
SODIUM: 134 mmol/L — AB (ref 135–145)
TOTAL PROTEIN: 7.4 g/dL (ref 6.5–8.1)

## 2017-10-22 LAB — CBC WITH DIFFERENTIAL/PLATELET
BASOS PCT: 0 %
Basophils Absolute: 0 10*3/uL (ref 0.0–0.1)
EOS ABS: 0.1 10*3/uL (ref 0.0–0.7)
Eosinophils Relative: 1 %
HEMATOCRIT: 31.4 % — AB (ref 36.0–46.0)
HEMOGLOBIN: 10.4 g/dL — AB (ref 12.0–15.0)
LYMPHS ABS: 1.4 10*3/uL (ref 0.7–4.0)
Lymphocytes Relative: 6 %
MCH: 27.2 pg (ref 26.0–34.0)
MCHC: 33.1 g/dL (ref 30.0–36.0)
MCV: 82.2 fL (ref 78.0–100.0)
MONO ABS: 2.2 10*3/uL — AB (ref 0.1–1.0)
MONOS PCT: 10 %
NEUTROS ABS: 18.5 10*3/uL — AB (ref 1.7–7.7)
NEUTROS PCT: 83 %
Platelets: 404 10*3/uL — ABNORMAL HIGH (ref 150–400)
RBC: 3.82 MIL/uL — ABNORMAL LOW (ref 3.87–5.11)
RDW: 15.8 % — AB (ref 11.5–15.5)
WBC: 22.2 10*3/uL — ABNORMAL HIGH (ref 4.0–10.5)

## 2017-10-22 LAB — PROTIME-INR
INR: 1.2
Prothrombin Time: 15.1 seconds (ref 11.4–15.2)

## 2017-10-22 LAB — I-STAT CG4 LACTIC ACID, ED
LACTIC ACID, VENOUS: 0.64 mmol/L (ref 0.5–1.9)
Lactic Acid, Venous: 0.92 mmol/L (ref 0.5–1.9)

## 2017-10-22 MED ORDER — SODIUM CHLORIDE 0.9 % IV BOLUS (SEPSIS)
1000.0000 mL | Freq: Once | INTRAVENOUS | Status: AC
  Administered 2017-10-22: 1000 mL via INTRAVENOUS

## 2017-10-22 NOTE — Telephone Encounter (Signed)
Pt called with fever. Temp yesterday was 100.4 then today is 101.0. Has a headache. No respiratory sx, no GU sx, no body aches nor chills.  Constipated for 4 days. She had BM on Monday, had 1 BM yesterday. She had 2 diarrhea stools, very soft, not water.   S/w Dr Alen Blew b/c Dr Alvy Bimler not in office. He said to instruct pt to go to ER.

## 2017-10-22 NOTE — ED Provider Notes (Signed)
[facility] DEPT Provider Note   CSN: [id number] Arrival date & time: 10/22/17  1332     History   Chief Complaint Chief Complaint  Patient presents with  . Fever    chemo pt    HPI Sierra Gray is a 56 y.o. female.  Pt presents to the ED today with a fever of 102 at home, nausea, headache.  Pt was recently diagnosed with left ovarian cancer.  She had an exploratory laparotomy with bilateral salpingo-oophorectomy, pelvic and para-aortic lymph node dissection, omentectomy and radical debulking for ovarian cancer on 10/4 by Dr. Denman George.  The pt had her first chemotherapy adjuvant treatment on 11/7.  She did receive taxol and carboplatin.  While receiving Taxol, she developed an allergic reaction and required steroids, benadryl, and pepcid.  The pt did recover after receiving those meds.  The pt said she developed mild lower abdominal pain, nausea, and headache a few days ago.  She is having some diarrhea, but took a bunch of laxatives for constipation prior to the diarrhea.  The pt did call Dr. Calton Dach office and was told to come to the ED.       Past Medical History:  Diagnosis Date  . Anemia    hx of  . Arthritis    oa knees  . Breast lump in female    benign  . Fibroids   . Hyperlipidemia   . Hypertension   . Migraine    hx of   . Pelvic mass in female   . Pelvic mass in female   . PONV (postoperative nausea and vomiting)    nausea only  . Sleep apnea     Patient Active Problem List   Diagnosis Date Noted  . Open abdominal incision with drainage 10/08/2017  . 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)     Past Surgical History:  Procedure Laterality Date  . 2 lumps right breast removed  yrs ago  . ABDOMINAL HYSTERECTOMY  2009   Total laparoscopic hyst for fibroids  . BREAST BIOPSY  12/16/2014   2 lumps in  right breast removed  . KNEE SURGERY Right    x3  . REDUCTION MAMMAPLASTY    . WISDOM TOOTH EXTRACTION      OB  History    No data available       Home Medications    Prior to Admission medications   Medication Sig Start Date End Date Taking? Authorizing Provider  acetaminophen (TYLENOL) 325 MG tablet Take 650 mg 2 (two) times daily as needed by mouth for headache.   Yes [provider]  amLODipine (NORVASC) 5 MG tablet Take 5 mg by mouth every evening.   Yes [provider]  Ascorbic Acid (VITAMIN C) 1000 MG tablet Take 1,000 mg by mouth every morning.    Yes [provider]  dexamethasone (DECADRON) 4 MG tablet Take 5 tabs the night before chemo and 5 tabs the morning of chemo, every 21 days, with food, by mouth 10/07/17  Yes Gorsuch, Ni, MD  Liniments (SALONPAS PAIN RELIEF PATCH EX) Place 1 patch onto the skin daily as needed (knee pain).   Yes [provider]  loratadine (CLARITIN) 10 MG tablet Take 10 mg by mouth every morning.    Yes [provider]  Melatonin 1 MG CAPS Take 1 mg by mouth at bedtime.   Yes [provider]  Menthol, Topical Analgesic, 4 % GEL Apply 1 application daily as needed topically (headache).  Yes [provider]  Methylsulfonylmethane (MSM) 1000 MG CAPS Take 1,000 mg by mouth every morning.    Yes [provider]  Multiple Vitamins-Minerals (HAIR/SKIN/NAILS) TABS Take 2 tablets by mouth every morning.   Yes [provider]  ondansetron (ZOFRAN) 8 MG tablet Take 1 tablet (8 mg total) by mouth every 8 (eight) hours as needed for refractory nausea / vomiting. Start on day 3 after chemo. 10/07/17  Yes Gorsuch, Ni, MD  UNABLE TO FIND CPAP   Yes [provider]  cephALEXin (KEFLEX) 500 MG capsule Take 1 capsule (500 mg total) by mouth 4 (four) times daily. Patient not taking: Reported on 10/22/2017 09/29/17   Everitt Amber, MD  enoxaparin (LOVENOX) 40 MG/0.4ML injection Inject 0.4 mLs (40 mg total) into the skin daily. Patient not taking: Reported on 10/22/2017 09/14/17   Lahoma Crocker, MD  prochlorperazine (COMPAZINE) 10 MG tablet Take 1 tablet (10 mg total) by mouth every 6 (six) hours as needed (Nausea or vomiting). Patient not taking: Reported on 10/22/2017 10/07/17   Heath Lark, MD    Family History Family History  Problem Relation Age of Onset  . Hypertension Mother   . Hyperlipidemia Mother   . Pancreatic cancer Father 68  . Hypertension Father   . Cancer Father 71       pancreatic  . Breast cancer Maternal Aunt   . Heart disease Maternal Aunt   . Cancer Maternal Aunt 60       breast  . Hyperlipidemia Maternal Uncle   . Diabetes Maternal Uncle   . Stroke Maternal Uncle   . Osteoporosis Maternal Uncle   . Heart attack Maternal Grandmother   . Diabetes Maternal Grandfather   . Dementia Maternal Grandfather   . Osteoporosis Paternal Grandmother   . Colon cancer Paternal Grandfather   . Cancer Paternal Grandfather        ?colon ca    Social History Social History   Tobacco Use  . Smoking status: Never Smoker  . Smokeless tobacco: Never Used  Substance Use Topics  . Alcohol use: Yes    Comment: Socially drinks wine/beer  . Drug use: No     Allergies   Bee venom; Chlorhexidine; Paclitaxel; Other; Bacitracin; and Iodine   Review of Systems Review of Systems  Constitutional: Positive for fever.  Gastrointestinal: Positive for diarrhea and nausea.  Neurological: Positive for headaches.  All other systems reviewed and are negative.    Physical Exam Updated Vital Signs BP 127/81 (BP Location: Left Arm)   Pulse (!) 112   Temp 98.8 F (37.1 C) (Oral)   Resp 16   Ht 5\' 4"  (1.626 m)   Wt 78 kg (172 lb)   SpO2 100%   BMI 29.52 kg/m   Physical Exam  Constitutional: She is oriented to person, place, and time. She appears well-developed and well-nourished.  HENT:  Head: Normocephalic and atraumatic.  Right Ear: External ear normal.  Left Ear: External ear normal.  Nose: Nose normal.  Mouth/Throat: Mucous membranes are dry.    Eyes: Conjunctivae and EOM are normal. Pupils are equal, round, and reactive to light.  Neck: Normal range of motion. Neck supple.  Cardiovascular: Normal rate, regular rhythm, normal heart sounds and intact distal pulses.  Pulmonary/Chest: Effort normal and breath sounds normal.  Abdominal: Soft. Bowel sounds are normal.  Musculoskeletal: Normal range of motion.  Neurological: She is alert and oriented to person, place, and time.  Skin: Skin is warm. Capillary refill takes  less than 2 seconds.  Psychiatric: She has a normal mood and affect. Her behavior is normal. Judgment and thought content normal.  Nursing note and vitals reviewed.    ED Treatments / Results  Labs (all labs ordered are listed, but only abnormal results are displayed) Labs Reviewed  COMPREHENSIVE METABOLIC PANEL - Abnormal; Notable for the following components:      Result Value   Sodium 134 (*)    Chloride 100 (*)    CO2 21 (*)    Glucose, Bld 117 (*)    Total Bilirubin 1.4 (*)    All other components within normal limits  CBC WITH DIFFERENTIAL/PLATELET - Abnormal; Notable for the following components:   WBC 22.2 (*)    RBC 3.82 (*)    Hemoglobin 10.4 (*)    HCT 31.4 (*)    RDW 15.8 (*)    Platelets 404 (*)    Neutro Abs 18.5 (*)    Monocytes Absolute 2.2 (*)    All other components within normal limits  URINALYSIS, ROUTINE W REFLEX MICROSCOPIC - Abnormal; Notable for the following components:   Color, Urine AMBER (*)    APPearance HAZY (*)    Ketones, ur 5 (*)    Protein, ur 100 (*)    Leukocytes, UA MODERATE (*)    Squamous Epithelial / LPF 6-30 (*)    Non Squamous Epithelial 0-5 (*)    All other components within normal limits  CULTURE, BLOOD (ROUTINE X 2)  CULTURE, BLOOD (ROUTINE X 2)  PROTIME-INR  I-STAT CG4 LACTIC ACID, ED  I-STAT CG4 LACTIC ACID, ED    EKG  EKG Interpretation None       Radiology Dg Chest 2 View  Result Date: 10/22/2017 CLINICAL DATA:  Two days of fever which  is higher today. Occasional cough. History of hypertension, nonsmoker. EXAM: CHEST  2 VIEW COMPARISON:  None in PACs FINDINGS: The lungs are adequately inflated and clear. The heart and pulmonary vascularity are normal. The mediastinum is normal in width. The bony thorax is unremarkable. IMPRESSION: There is no pneumonia nor other acute cardiopulmonary abnormality. Electronically Signed   By: David  Martinique M.D.   On: 10/22/2017 15:46    Procedures Procedures (including critical care time)  Medications Ordered in ED Medications  sodium chloride 0.9 % bolus 1,000 mL (0 mLs Intravenous Stopped 10/22/17 1552)     Initial Impression / Assessment and Plan / ED Course  I have reviewed the triage vital signs and the nursing notes.  Pertinent labs & imaging results that were available during my care of the patient were reviewed by me and considered in my medical decision making (see chart for details).    Pt did have Neulasta after chemo which is probably why her WBC is high.  The pt was d/w Dr. Alen Blew for recommendations.  He recommends d/c as long as pt is not neutropenic.  The pt looks good.  Blood cultures are pending.  She knows to f/u with Dr. Alvy Bimler and to return if worse.  Final Clinical Impressions(s) / ED Diagnoses   Final diagnoses:  Fever, unspecified fever cause    ED Discharge Orders    None       Isla Pence, MD 10/22/17 [phone]

## 2017-10-22 NOTE — Discharge Instructions (Signed)
Tylenol and ibuprofen as needed for fever.

## 2017-10-22 NOTE — ED Triage Notes (Signed)
Pt on chemotherapy complains of fever, headache and diarrhea for the past couple days. Pt had her first chemo treatment last week. Pt states she had been constipated so took Miralax and stool softeners over the past couple days.

## 2017-10-22 NOTE — ED Notes (Signed)
Bed: HO64 Expected date:  Expected time:  Means of arrival:  Comments: Cancer pt in triage

## 2017-10-23 ENCOUNTER — Ambulatory Visit (HOSPITAL_COMMUNITY): Payer: BLUE CROSS/BLUE SHIELD

## 2017-10-23 ENCOUNTER — Other Ambulatory Visit (HOSPITAL_COMMUNITY): Payer: BLUE CROSS/BLUE SHIELD

## 2017-10-23 ENCOUNTER — Other Ambulatory Visit: Payer: Self-pay

## 2017-10-23 ENCOUNTER — Telehealth: Payer: Self-pay

## 2017-10-23 ENCOUNTER — Telehealth: Payer: Self-pay | Admitting: Hematology and Oncology

## 2017-10-23 MED ORDER — AMOXICILLIN 500 MG PO TABS: 500.0000 mg | ORAL_TABLET | Freq: Two times a day (BID) | ORAL | 0 refills | Status: DC

## 2017-10-23 NOTE — Telephone Encounter (Signed)
Patient called in wanting to make an appointment with Dr.Gorsuch however Hassan Rowan the nurse said she would call the patient her self.

## 2017-10-23 NOTE — Telephone Encounter (Signed)
Called to follow up on visit to ER yesterday. She has a temperature of 100.1 today, with diarrhea x 1 today. Denies urinary symptoms. Abdomen tenderness. States she has notified IR of temperature, she is scheduled for port today.

## 2017-10-23 NOTE — Telephone Encounter (Signed)
Called patient back, per Dr. Alvy Bimler. IR rescheduled her port placement to the 11- 26. Instructed to push fluids and call if unable to drink fluids. Instructed to call office for any further problems. Rx for Amoxicillin sent to pharmacy.

## 2017-10-27 LAB — CULTURE, BLOOD (ROUTINE X 2)
CULTURE: NO GROWTH
Culture: NO GROWTH
SPECIAL REQUESTS: ADEQUATE
SPECIAL REQUESTS: ADEQUATE

## 2017-10-28 ENCOUNTER — Telehealth: Payer: Self-pay | Admitting: Hematology and Oncology

## 2017-10-28 NOTE — Telephone Encounter (Signed)
10/28/2017 @ 9:39 am called [phone] and spoke with patient informing them their FMLA/Disability forms were successfully faxed to [facility] @ [phone].  Patient requested a copy for personal records.

## 2017-10-31 ENCOUNTER — Other Ambulatory Visit: Payer: Self-pay | Admitting: Radiology

## 2017-11-03 ENCOUNTER — Other Ambulatory Visit: Payer: Self-pay | Admitting: Hematology and Oncology

## 2017-11-03 ENCOUNTER — Ambulatory Visit (HOSPITAL_COMMUNITY)
Admission: RE | Admit: 2017-11-03 | Discharge: 2017-11-03 | Disposition: A | Discharge status: Home / Self Care | Payer: BLUE CROSS/BLUE SHIELD | Source: Ambulatory Visit | Attending: Hematology and Oncology | Admitting: Hematology and Oncology

## 2017-11-03 ENCOUNTER — Encounter (HOSPITAL_COMMUNITY): Payer: Self-pay

## 2017-11-03 DIAGNOSIS — Z90710 Acquired absence of both cervix and uterus: Secondary | ICD-10-CM | POA: Insufficient documentation

## 2017-11-03 DIAGNOSIS — C569 Malignant neoplasm of unspecified ovary: Secondary | ICD-10-CM | POA: Insufficient documentation

## 2017-11-03 DIAGNOSIS — Z818 Family history of other mental and behavioral disorders: Secondary | ICD-10-CM | POA: Diagnosis not present

## 2017-11-03 DIAGNOSIS — Z8349 Family history of other endocrine, nutritional and metabolic diseases: Secondary | ICD-10-CM | POA: Diagnosis not present

## 2017-11-03 DIAGNOSIS — Z881 Allergy status to other antibiotic agents status: Secondary | ICD-10-CM | POA: Diagnosis not present

## 2017-11-03 DIAGNOSIS — G4730 Sleep apnea, unspecified: Secondary | ICD-10-CM | POA: Insufficient documentation

## 2017-11-03 DIAGNOSIS — Z888 Allergy status to other drugs, medicaments and biological substances status: Secondary | ICD-10-CM | POA: Insufficient documentation

## 2017-11-03 DIAGNOSIS — Z79899 Other long term (current) drug therapy: Secondary | ICD-10-CM | POA: Insufficient documentation

## 2017-11-03 DIAGNOSIS — C562 Malignant neoplasm of left ovary: Secondary | ICD-10-CM

## 2017-11-03 DIAGNOSIS — Z823 Family history of stroke: Secondary | ICD-10-CM | POA: Diagnosis not present

## 2017-11-03 DIAGNOSIS — Z90722 Acquired absence of ovaries, bilateral: Secondary | ICD-10-CM | POA: Insufficient documentation

## 2017-11-03 DIAGNOSIS — Z91030 Bee allergy status: Secondary | ICD-10-CM | POA: Diagnosis not present

## 2017-11-03 DIAGNOSIS — Z803 Family history of malignant neoplasm of breast: Secondary | ICD-10-CM | POA: Diagnosis not present

## 2017-11-03 DIAGNOSIS — Z8249 Family history of ischemic heart disease and other diseases of the circulatory system: Secondary | ICD-10-CM | POA: Insufficient documentation

## 2017-11-03 DIAGNOSIS — Z8262 Family history of osteoporosis: Secondary | ICD-10-CM | POA: Diagnosis not present

## 2017-11-03 DIAGNOSIS — I10 Essential (primary) hypertension: Secondary | ICD-10-CM | POA: Insufficient documentation

## 2017-11-03 DIAGNOSIS — Z800 Family history of malignant neoplasm of digestive organs: Secondary | ICD-10-CM | POA: Diagnosis not present

## 2017-11-03 DIAGNOSIS — Z9889 Other specified postprocedural states: Secondary | ICD-10-CM | POA: Insufficient documentation

## 2017-11-03 HISTORY — PX: IR US GUIDE VASC ACCESS RIGHT: IMG2390

## 2017-11-03 HISTORY — PX: IR FLUORO GUIDE PORT INSERTION RIGHT: IMG5741

## 2017-11-03 LAB — CBC WITH DIFFERENTIAL/PLATELET
BASOS ABS: 0.1 10*3/uL (ref 0.0–0.1)
Basophils Relative: 1 %
EOS ABS: 0.2 10*3/uL (ref 0.0–0.7)
Eosinophils Relative: 2 %
HCT: 28.9 % — ABNORMAL LOW (ref 36.0–46.0)
HEMOGLOBIN: 9.2 g/dL — AB (ref 12.0–15.0)
LYMPHS ABS: 2.2 10*3/uL (ref 0.7–4.0)
Lymphocytes Relative: 20 %
MCH: 26.4 pg (ref 26.0–34.0)
MCHC: 31.8 g/dL (ref 30.0–36.0)
MCV: 82.8 fL (ref 78.0–100.0)
MONO ABS: 0.8 10*3/uL (ref 0.1–1.0)
Monocytes Relative: 7 %
NEUTROS ABS: 7.6 10*3/uL (ref 1.7–7.7)
Neutrophils Relative %: 70 %
PLATELETS: 985 10*3/uL — AB (ref 150–400)
RBC: 3.49 MIL/uL — AB (ref 3.87–5.11)
RDW: 15.9 % — AB (ref 11.5–15.5)
WBC: 10.9 10*3/uL — AB (ref 4.0–10.5)

## 2017-11-03 LAB — PROTIME-INR
INR: 1.15
PROTHROMBIN TIME: 14.7 s (ref 11.4–15.2)

## 2017-11-03 MED ORDER — SODIUM CHLORIDE 0.9 % IV SOLN
INTRAVENOUS | Status: DC
  Administered 2017-11-03: 13:00:00 via INTRAVENOUS

## 2017-11-03 MED ORDER — FENTANYL CITRATE (PF) 100 MCG/2ML IJ SOLN
INTRAMUSCULAR | Status: AC
  Filled 2017-11-03: qty 2

## 2017-11-03 MED ORDER — LIDOCAINE-EPINEPHRINE (PF) 2 %-1:200000 IJ SOLN
INTRAMUSCULAR | Status: DC
Start: 2017-11-03 — End: 2017-11-04
  Filled 2017-11-03: qty 20

## 2017-11-03 MED ORDER — MIDAZOLAM HCL 2 MG/2ML IJ SOLN
INTRAMUSCULAR | Status: AC
  Filled 2017-11-03: qty 6

## 2017-11-03 MED ORDER — CEFAZOLIN SODIUM-DEXTROSE 2-4 GM/100ML-% IV SOLN
INTRAVENOUS | Status: AC
  Filled 2017-11-03: qty 100

## 2017-11-03 MED ORDER — FENTANYL CITRATE (PF) 100 MCG/2ML IJ SOLN
INTRAMUSCULAR | Status: AC | PRN
  Administered 2017-11-03: 50 ug via INTRAVENOUS
  Administered 2017-11-03 (×2): 25 ug via INTRAVENOUS

## 2017-11-03 MED ORDER — MIDAZOLAM HCL 2 MG/2ML IJ SOLN
INTRAMUSCULAR | Status: AC | PRN
  Administered 2017-11-03: 1 mg via INTRAVENOUS
  Administered 2017-11-03: 0.5 mg via INTRAVENOUS
  Administered 2017-11-03 (×2): 1 mg via INTRAVENOUS
  Administered 2017-11-03: 0.5 mg via INTRAVENOUS

## 2017-11-03 MED ORDER — CEFAZOLIN SODIUM-DEXTROSE 2-4 GM/100ML-% IV SOLN
2.0000 g | INTRAVENOUS | Status: AC
  Administered 2017-11-03: 2 g via INTRAVENOUS

## 2017-11-03 MED ORDER — LIDOCAINE-EPINEPHRINE (PF) 2 %-1:200000 IJ SOLN
INTRAMUSCULAR | Status: AC | PRN
  Administered 2017-11-03: 10 mL via INTRADERMAL

## 2017-11-03 MED ORDER — HEPARIN SOD (PORK) LOCK FLUSH 100 UNIT/ML IV SOLN
INTRAVENOUS | Status: AC
  Filled 2017-11-03: qty 5

## 2017-11-03 MED ORDER — HEPARIN SOD (PORK) LOCK FLUSH 100 UNIT/ML IV SOLN
INTRAVENOUS | Status: AC | PRN
  Administered 2017-11-03: 500 [IU]

## 2017-11-03 NOTE — Consult Note (Signed)
Chief Complaint: Patient was seen in consultation today for Port-A-Cath placement  Referring Physician(s): Gorsuch,Ni  Supervising Physician: Sandi Mariscal  Patient Status: [place]  History of Present Illness: Sierra Gray is a 56 y.o. female with history of ovarian carcinoma, status post debulking surgery on 09/11/17. She has poor venous access and  presents today for Port-A-Cath placement for chemotherapy.  Past Medical History:  Diagnosis Date  . Anemia    hx of  . Arthritis    oa knees  . Breast lump in female    benign  . Fibroids   . Hyperlipidemia   . Hypertension   . Migraine    hx of   . Pelvic mass in female   . Pelvic mass in female   . PONV (postoperative nausea and vomiting)    nausea only  . Sleep apnea     Past Surgical History:  Procedure Laterality Date  . 2 lumps right breast removed  yrs ago  . ABDOMINAL HYSTERECTOMY  2009   Total laparoscopic hyst for fibroids  . BILATERAL SALPINGECTOMY Bilateral 09/11/2017   Procedure: BILATERAL SALPINGECTOMY WITH OOPHERECTOMY;  Surgeon: Everitt Amber, MD;  Location: [facility];  Service: Gynecology;  Laterality: Bilateral;  . BREAST BIOPSY  12/16/2014   2 lumps in  right breast removed  . EXCISION OF SKIN TAG N/A 09/11/2017   Procedure: EXCISION OF SKIN TAG ON THIGH;  Surgeon: Everitt Amber, MD;  Location: [facility];  Service: Gynecology;  Laterality: N/A;  . KNEE SURGERY Right    x3  . LAPAROTOMY Bilateral 09/11/2017   Procedure: EXPLORATORY LAPAROTOMY;  Surgeon: Everitt Amber, MD;  Location: [facility];  Service: Gynecology;  Laterality: Bilateral;  . LAPAROTOMY WITH STAGING N/A 09/11/2017   Procedure: PELVIC AND PARA AORTIC LYMPH NODE  DISSECTION;  Surgeon: Everitt Amber, MD;  Location: [facility];  Service: Gynecology;  Laterality: N/A;  . OMENTECTOMY N/A 09/11/2017   Procedure: OMENTECTOMY;  Surgeon: Everitt Amber, MD;  Location: [facility];  Service: Gynecology;  Laterality: N/A;  . REDUCTION MAMMAPLASTY    . WISDOM TOOTH  EXTRACTION      Allergies: Bee venom; Chlorhexidine; Paclitaxel; Other; Bacitracin; and Iodine  Medications: Prior to Admission medications   Medication Sig Start Date End Date Taking? Authorizing Provider  acetaminophen (TYLENOL) 325 MG tablet Take 650 mg 2 (two) times daily as needed by mouth for headache.    [provider]  amLODipine (NORVASC) 5 MG tablet Take 5 mg by mouth every evening.    [provider]  amoxicillin (AMOXIL) 500 MG tablet Take 1 tablet (500 mg total) 2 (two) times daily by mouth. 10/23/17   Heath Lark, MD  Ascorbic Acid (VITAMIN C) 1000 MG tablet Take 1,000 mg by mouth every morning.     [provider]  cephALEXin (KEFLEX) 500 MG capsule Take 1 capsule (500 mg total) by mouth 4 (four) times daily. Patient not taking: Reported on 10/22/2017 09/29/17   Everitt Amber, MD  dexamethasone (DECADRON) 4 MG tablet Take 5 tabs the night before chemo and 5 tabs the morning of chemo, every 21 days, with food, by mouth 10/07/17   Alvy Bimler, Ni, MD  enoxaparin (LOVENOX) 40 MG/0.4ML injection Inject 0.4 mLs (40 mg total) into the skin daily. Patient not taking: Reported on 10/22/2017 09/14/17   Lahoma Crocker, MD  [facility] PAIN RELIEF PATCH EX) Place 1 patch onto the skin daily as needed (knee pain).    [provider]  loratadine (CLARITIN)  10 MG tablet Take 10 mg by mouth every morning.     [provider]  Melatonin 1 MG CAPS Take 1 mg by mouth at bedtime.    [provider]  Menthol, Topical Analgesic, 4 % GEL Apply 1 application daily as needed topically (headache).    [provider]  Methylsulfonylmethane (MSM) 1000 MG CAPS Take 1,000 mg by mouth every morning.     [provider]  Multiple Vitamins-Minerals (HAIR/SKIN/NAILS) TABS Take 2 tablets by mouth every morning.    [provider]  ondansetron (ZOFRAN) 8 MG tablet Take 1 tablet (8 mg total) by mouth every 8 (eight) hours as  needed for refractory nausea / vomiting. Start on day 3 after chemo. 10/07/17   Heath Lark, MD  prochlorperazine (COMPAZINE) 10 MG tablet Take 1 tablet (10 mg total) by mouth every 6 (six) hours as needed (Nausea or vomiting). Patient not taking: Reported on 10/22/2017 10/07/17   Heath Lark, MD  UNABLE TO FIND CPAP    [provider]     Family History  Problem Relation Age of Onset  . Hypertension Mother   . Hyperlipidemia Mother   . Pancreatic cancer Father 41  . Hypertension Father   . Cancer Father 35       pancreatic  . Breast cancer Maternal Aunt   . Heart disease Maternal Aunt   . Cancer Maternal Aunt 15       breast  . Hyperlipidemia Maternal Uncle   . Diabetes Maternal Uncle   . Stroke Maternal Uncle   . Osteoporosis Maternal Uncle   . Heart attack Maternal Grandmother   . Diabetes Maternal Grandfather   . Dementia Maternal Grandfather   . Osteoporosis Paternal Grandmother   . Colon cancer Paternal Grandfather   . Cancer Paternal Grandfather        ?colon ca    Social History   Socioeconomic History  . Marital status: Single    Spouse name: Not on file  . Number of children: Not on file  . Years of education: Not on file  . Highest education level: Not on file  Social Needs  . Financial resource strain: Not on file  . Food insecurity - worry: Not on file  . Food insecurity - inability: Not on file  . Transportation needs - medical: Not on file  . Transportation needs - non-medical: Not on file  Occupational History  . Occupation: Freight forwarder  Tobacco Use  . Smoking status: Never Smoker  . Smokeless tobacco: Never Used  Substance and Sexual Activity  . Alcohol use: Yes    Comment: Socially drinks wine/beer  . Drug use: No  . Sexual activity: No  Other Topics Concern  . Not on file  Social History Narrative  . Not on file      Review of Systems denies fever, chest pain, dyspnea, cough, abdominal/back pain, nausea, vomiting or bleeding.  She  does have intermittent headaches. Vital Signs: BP 132/83   Pulse (!) 101   Temp 98.4 F (36.9 C) (Oral)   Resp 18   Ht 5\' 4"  (1.626 m)   Wt 172 lb (78 kg)   SpO2 100%   BMI 29.52 kg/m   Physical Exam awake, alert.  Chest clear to auscultation bilaterally.  Heart with slightly tachycardic but regular rhythm.  Abdomen soft, positive bowel sounds, midline wound with no significant erythema or drainage.  No lower extremity edema  Imaging: Dg Chest 2 View  Result Date: 10/22/2017  CLINICAL DATA:  Two days of fever which is higher today. Occasional cough. History of hypertension, nonsmoker. EXAM: CHEST  2 VIEW COMPARISON:  None in PACs FINDINGS: The lungs are adequately inflated and clear. The heart and pulmonary vascularity are normal. The mediastinum is normal in width. The bony thorax is unremarkable. IMPRESSION: There is no pneumonia nor other acute cardiopulmonary abnormality. Electronically Signed   By: David  Martinique M.D.   On: 10/22/2017 15:46    Labs:  CBC: Recent Labs    09/04/17 0904 09/12/17 0444 10/09/17 0921 10/22/17 1411  WBC 11.5* 15.6* 8.8 22.2*  HGB 13.1 10.5* 12.4 10.4*  HCT 39.4 32.3* 39.0 31.4*  PLT 547* 366 379 404*    COAGS: Recent Labs    10/22/17 1411  INR 1.20    BMP: Recent Labs    08/27/17 1048 09/12/17 0444 10/09/17 0921 10/22/17 1411  NA 139 138 139 134*  K 3.5 3.3* 3.7 3.6  CL  --  102  --  100*  CO2 29 27 27  21*  GLUCOSE 109 138* 108 117*  BUN 10.8 11 11.9 12  CALCIUM 11.3* 8.7* 10.3 9.1  CREATININE 0.9 0.86 0.9 0.98  GFRNONAA  --  >60  --  >60  GFRAA  --  >60  --  >60    LIVER FUNCTION TESTS: Recent Labs    08/27/17 1048 10/09/17 0921 10/22/17 1411  BILITOT 0.36 0.60 1.4*  AST 15 19 20   ALT 8 21 25   ALKPHOS 64 55 84  PROT 8.8* 7.6 7.4  ALBUMIN 4.2 4.1 3.5    TUMOR MARKERS: No results for input(s): AFPTM, CEA, CA199, CHROMGRNA in the last 8760 hours.  Assessment and Plan: 56 y.o. female with history of ovarian  carcinoma, status post debulking surgery on 09/11/17. She has poor venous access and  presents today for Port-A-Cath placement for chemotherapy.Risks and benefits discussed with the patient/mother including, but not limited to bleeding, infection, pneumothorax, or fibrin sheath development and need for additional procedures.All of the patient's questions were answered, patient is agreeable to proceed.Consent signed and in chart. Labs pending.     Thank you for this interesting consult.  I greatly enjoyed meeting Sierra Gray and look forward to participating in their care.  A copy of this report was sent to the requesting provider on this date.  Electronically Signed: D. Rowe Robert, PA-C 11/03/2017, 12:51 PM   I spent a total of 25 minutes in face to face in clinical consultation, greater than 50% of which was counseling/coordinating care for Port-A-Cath placement

## 2017-11-03 NOTE — Sedation Documentation (Signed)
Patient is resting comfortably. 

## 2017-11-03 NOTE — Progress Notes (Addendum)
CRITICAL VALUE ALERT  Critical Value:  Platelet 985  Date & Time Notied:  11/03/17 at 1356  Provider Notified: Odelia Gage, PA  Orders Received/Actions taken: Provider made aware. States it is safe to continue with procedure.

## 2017-11-03 NOTE — Sedation Documentation (Signed)
Patient denies pain and is resting comfortably.  

## 2017-11-03 NOTE — Procedures (Signed)
Pre Procedure Dx: Ovarian cancer Post Procedural Dx: Same  Successful placement of right IJ approach port-a-cath with tip at the superior caval atrial junction. The catheter is ready for immediate use.  Estimated Blood Loss: Minimal  Complications: None immediate.  Ronny Bacon, MD Pager #: [phone]

## 2017-11-03 NOTE — Discharge Instructions (Addendum)
Moderate Conscious Sedation, Adult, Care After These instructions provide you with information about caring for yourself after your procedure. Your health care provider may also give you more specific instructions. Your treatment has been planned according to current medical practices, but problems sometimes occur. Call your health care provider if you have any problems or questions after your procedure. What can I expect after the procedure? After your procedure, it is common:  To feel sleepy for several hours.  To feel clumsy and have poor balance for several hours.  To have poor judgment for several hours.  To vomit if you eat too soon.  Follow these instructions at home: For at least 24 hours after the procedure:   Do not: ? Participate in activities where you could fall or become injured. ? Drive. ? Use heavy machinery. ? Drink alcohol. ? Take sleeping pills or medicines that cause drowsiness. ? Make important decisions or sign legal documents. ? Take care of children on your own.  Rest. Eating and drinking  Follow the diet recommended by your health care provider.  If you vomit: ? Drink water, juice, or soup when you can drink without vomiting. ? Make sure you have little or no nausea before eating solid foods. General instructions  Have a responsible adult stay with you until you are awake and alert.  Take over-the-counter and prescription medicines only as told by your health care provider.  If you smoke, do not smoke without supervision.  Keep all follow-up visits as told by your health care provider. This is important. Contact a health care provider if:  You keep feeling nauseous or you keep vomiting.  You feel light-headed.  You develop a rash.  You have a fever. Get help right away if:  You have trouble breathing. This information is not intended to replace advice given to you by your health care provider. Make sure you discuss any questions you have  with your health care provider. Document Released: 09/15/2013 Document Revised: 04/29/2016 Document Reviewed: 03/16/2016 Elsevier Interactive Patient Education  2018 [place] An implanted port is a type of central line that is placed under the skin. Central lines are used to provide IV access when treatment or nutrition needs to be given through a persons veins. Implanted ports are used for long-term IV access. An implanted port may be placed because:  You need IV medicine that would be irritating to the small veins in your hands or arms.  You need long-term IV medicines, such as antibiotics.  You need IV nutrition for a long period.  You need frequent blood draws for lab tests.  You need dialysis.  Implanted ports are usually placed in the chest area, but they can also be placed in the upper arm, the abdomen, or the leg. An implanted port has two main parts:  Reservoir. The reservoir is round and will appear as a small, raised area under your skin. The reservoir is the part where a needle is inserted to give medicines or draw blood.  Catheter. The catheter is a thin, flexible tube that extends from the reservoir. The catheter is placed into a large vein. Medicine that is inserted into the reservoir goes into the catheter and then into the vein.  How will I care for my incision site? Do not get the incision site wet. Bathe or shower as directed by your health care provider. How is my port accessed? Special steps must be taken to access the port:  Before the port is accessed, a numbing cream can be placed on the skin. This helps numb the skin over the port site.  Your health care provider uses a sterile technique to access the port. ? Your health care provider must put on a mask and sterile gloves. ? The skin over your port is cleaned carefully with an antiseptic and allowed to dry. ? The port is gently pinched between sterile gloves, and a needle is  inserted into the port.  Only "non-coring" port needles should be used to access the port. Once the port is accessed, a blood return should be checked. This helps ensure that the port is in the vein and is not clogged.  If your port needs to remain accessed for a constant infusion, a clear (transparent) bandage will be placed over the needle site. The bandage and needle will need to be changed every week, or as directed by your health care provider.  Keep the bandage covering the needle clean and dry. Do not get it wet. Follow your health care providers instructions on how to take a shower or bath while the port is accessed.  If your port does not need to stay accessed, no bandage is needed over the port.  What is flushing? Flushing helps keep the port from getting clogged. Follow your health care providers instructions on how and when to flush the port. Ports are usually flushed with saline solution or a medicine called heparin. The need for flushing will depend on how the port is used.  If the port is used for intermittent medicines or blood draws, the port will need to be flushed: ? After medicines have been given. ? After blood has been drawn. ? As part of routine maintenance.  If a constant infusion is running, the port may not need to be flushed.  How long will my port stay implanted? The port can stay in for as long as your health care provider thinks it is needed. When it is time for the port to come out, surgery will be done to remove it. The procedure is similar to the one performed when the port was put in. When should I seek immediate medical care? When you have an implanted port, you should seek immediate medical care if:  You notice a bad smell coming from the incision site.  You have swelling, redness, or drainage at the incision site.  You have more swelling or pain at the port site or the surrounding area.  You have a fever that is not controlled with  medicine.  This information is not intended to replace advice given to you by your health care provider. Make sure you discuss any questions you have with your health care provider. Document Released: 11/25/2005 Document Revised: 05/02/2016 Document Reviewed: 08/02/2013 Elsevier Interactive Patient Education  2017 [place] Insertion, Care After This sheet gives you information about how to care for yourself after your procedure. Your health care provider may also give you more specific instructions. If you have problems or questions, contact your health care provider. What can I expect after the procedure? After your procedure, it is common to have:  Discomfort at the port insertion site.  Bruising on the skin over the port. This should improve over 3-4 days.  Follow these instructions at home: [facility] care  After your port is placed, you will get a manufacturer's information card. The card has information about your port. Keep this card with  you at all times.  Take care of the port as told by your health care provider. Ask your health care provider if you or a family member can get training for taking care of the port at home. A home health care nurse may also take care of the port.  Make sure to remember what type of port you have. Incision care  Follow instructions from your health care provider about how to take care of your port insertion site. Make sure you: ? Wash your hands with soap and water before you change your bandage (dressing). If soap and water are not available, use hand sanitizer. ? Change your dressing as told by your health care provider.  You may remove your dressing tomorrow. ? Leave skin glue in place. These skin closures may need to stay in place for 2 weeks or longer. If adhesive strip edges start to loosen and curl up, you may trim the loose edges. Do not remove adhesive strips completely unless your health care provider tells you to do  that.  Do Not use EMLA cream for 2 weeks after port placement as this cream will remove surgical glue.  Check your port insertion site every day for signs of infection. Check for: ? More redness, swelling, or pain. ? More fluid or blood. ? Warmth. ? Pus or a bad smell. General instructions  Do not take baths, swim, or use a hot tub until your health care provider approves.  Do not lift anything that is heavier than 10 lb (4.5 kg) for a week, or as told by your health care provider.  Ask your health care provider when it is okay to: ? Return to work or school. ? Resume usual physical activities or sports.  Do not drive for 24 hours if you were given a medicine to help you relax (sedative).  Take over-the-counter and prescription medicines only as told by your health care provider.  Wear a medical alert bracelet in case of an emergency. This will tell any health care providers that you have a port.  Keep all follow-up visits as told by your health care provider. This is important. Contact a health care provider if:  You have a fever or chills.  You have more redness, swelling, or pain around your port insertion site.  You have more fluid or blood coming from your port insertion site.  Your port insertion site feels warm to the touch.  You have pus or a bad smell coming from the port insertion site. Get help right away if:  You have chest pain or shortness of breath.  You have bleeding from your port that you cannot control. Summary  Take care of the port as told by your health care provider.  Change your dressing as told by your health care provider.  Keep all follow-up visits as told by your health care provider. This information is not intended to replace advice given to you by your health care provider. Make sure you discuss any questions you have with your health care provider. Document Released: 09/15/2013 Document Revised: 10/16/2016 Document Reviewed:  10/16/2016 Elsevier Interactive Patient Education  2017 [employer].

## 2017-11-04 ENCOUNTER — Telehealth: Payer: Self-pay | Admitting: Hematology and Oncology

## 2017-11-04 NOTE — Telephone Encounter (Signed)
Faxed records to [facility]

## 2017-11-05 ENCOUNTER — Ambulatory Visit (HOSPITAL_BASED_OUTPATIENT_CLINIC_OR_DEPARTMENT_OTHER): Payer: BLUE CROSS/BLUE SHIELD | Admitting: Hematology and Oncology

## 2017-11-05 ENCOUNTER — Other Ambulatory Visit (HOSPITAL_BASED_OUTPATIENT_CLINIC_OR_DEPARTMENT_OTHER): Payer: BLUE CROSS/BLUE SHIELD

## 2017-11-05 ENCOUNTER — Ambulatory Visit (HOSPITAL_BASED_OUTPATIENT_CLINIC_OR_DEPARTMENT_OTHER): Payer: BLUE CROSS/BLUE SHIELD

## 2017-11-05 ENCOUNTER — Telehealth: Payer: Self-pay | Admitting: Hematology and Oncology

## 2017-11-05 ENCOUNTER — Encounter: Payer: Self-pay | Admitting: Hematology and Oncology

## 2017-11-05 VITALS — BP 141/81 | HR 123 | Temp 98.1°F | Resp 18 | Ht 64.0 in | Wt 177.3 lb

## 2017-11-05 DIAGNOSIS — Z5111 Encounter for antineoplastic chemotherapy: Secondary | ICD-10-CM

## 2017-11-05 DIAGNOSIS — C562 Malignant neoplasm of left ovary: Secondary | ICD-10-CM | POA: Diagnosis not present

## 2017-11-05 DIAGNOSIS — T8131XS Disruption of external operation (surgical) wound, not elsewhere classified, sequela: Secondary | ICD-10-CM

## 2017-11-05 DIAGNOSIS — R7989 Other specified abnormal findings of blood chemistry: Secondary | ICD-10-CM

## 2017-11-05 DIAGNOSIS — R5381 Other malaise: Secondary | ICD-10-CM | POA: Insufficient documentation

## 2017-11-05 DIAGNOSIS — R202 Paresthesia of skin: Secondary | ICD-10-CM

## 2017-11-05 DIAGNOSIS — D75838 Other thrombocytosis: Secondary | ICD-10-CM

## 2017-11-05 LAB — COMPREHENSIVE METABOLIC PANEL
ALT: 12 U/L (ref 0–55)
ANION GAP: 14 meq/L — AB (ref 3–11)
AST: 9 U/L (ref 5–34)
Albumin: 2.9 g/dL — ABNORMAL LOW (ref 3.5–5.0)
Alkaline Phosphatase: 75 U/L (ref 40–150)
BUN: 8.6 mg/dL (ref 7.0–26.0)
CO2: 23 meq/L (ref 22–29)
Calcium: 10.5 mg/dL — ABNORMAL HIGH (ref 8.4–10.4)
Chloride: 102 mEq/L (ref 98–109)
Creatinine: 0.9 mg/dL (ref 0.6–1.1)
Glucose: 209 mg/dl — ABNORMAL HIGH (ref 70–140)
POTASSIUM: 4.1 meq/L (ref 3.5–5.1)
Sodium: 139 mEq/L (ref 136–145)
Total Bilirubin: 0.24 mg/dL (ref 0.20–1.20)
Total Protein: 8.3 g/dL (ref 6.4–8.3)

## 2017-11-05 LAB — CBC WITH DIFFERENTIAL/PLATELET
BASO%: 0.2 % (ref 0.0–2.0)
Basophils Absolute: 0 10*3/uL (ref 0.0–0.1)
EOS ABS: 0 10*3/uL (ref 0.0–0.5)
EOS%: 0 % (ref 0.0–7.0)
HCT: 32.5 % — ABNORMAL LOW (ref 34.8–46.6)
HGB: 10.1 g/dL — ABNORMAL LOW (ref 11.6–15.9)
LYMPH%: 12.8 % — AB (ref 14.0–49.7)
MCH: 26.2 pg (ref 25.1–34.0)
MCHC: 31.1 g/dL — AB (ref 31.5–36.0)
MCV: 84.4 fL (ref 79.5–101.0)
MONO#: 0.1 10*3/uL (ref 0.1–0.9)
MONO%: 0.8 % (ref 0.0–14.0)
NEUT#: 8.6 10*3/uL — ABNORMAL HIGH (ref 1.5–6.5)
NEUT%: 86.2 % — ABNORMAL HIGH (ref 38.4–76.8)
PLATELETS: 927 10*3/uL — AB (ref 145–400)
RBC: 3.85 10*6/uL (ref 3.70–5.45)
RDW: 16 % — ABNORMAL HIGH (ref 11.2–14.5)
WBC: 10 10*3/uL (ref 3.9–10.3)
lymph#: 1.3 10*3/uL (ref 0.9–3.3)

## 2017-11-05 LAB — PATHOLOGIST SMEAR REVIEW

## 2017-11-05 MED ORDER — PACLITAXEL PROTEIN-BOUND CHEMO INJECTION 100 MG
125.0000 mg/m2 | Freq: Once | INTRAVENOUS | Status: AC
Start: 2017-11-05 — End: 2017-11-05
  Administered 2017-11-05: 250 mg via INTRAVENOUS
  Filled 2017-11-05: qty 50

## 2017-11-05 MED ORDER — PALONOSETRON HCL INJECTION 0.25 MG/5ML
0.2500 mg | Freq: Once | INTRAVENOUS | Status: AC
  Administered 2017-11-05: 0.25 mg via INTRAVENOUS

## 2017-11-05 MED ORDER — LIDOCAINE-PRILOCAINE 2.5-2.5 % EX CREA
1.0000 | TOPICAL_CREAM | CUTANEOUS | 6 refills | Status: DC | PRN
Start: 2017-11-05 — End: 2018-04-20

## 2017-11-05 MED ORDER — PALONOSETRON HCL INJECTION 0.25 MG/5ML
INTRAVENOUS | Status: AC
  Filled 2017-11-05: qty 5

## 2017-11-05 MED ORDER — FAMOTIDINE IN NACL 20-0.9 MG/50ML-% IV SOLN
20.0000 mg | Freq: Once | INTRAVENOUS | Status: AC
  Administered 2017-11-05: 20 mg via INTRAVENOUS

## 2017-11-05 MED ORDER — SODIUM CHLORIDE 0.9% FLUSH
10.0000 mL | INTRAVENOUS | Status: DC | PRN
  Administered 2017-11-05: 10 mL
  Filled 2017-11-05: qty 10

## 2017-11-05 MED ORDER — DIPHENHYDRAMINE HCL 50 MG/ML IJ SOLN
INTRAMUSCULAR | Status: AC
  Filled 2017-11-05: qty 1

## 2017-11-05 MED ORDER — FAMOTIDINE IN NACL 20-0.9 MG/50ML-% IV SOLN
INTRAVENOUS | Status: AC
  Filled 2017-11-05: qty 50

## 2017-11-05 MED ORDER — SODIUM CHLORIDE 0.9 % IV SOLN
Freq: Once | INTRAVENOUS | Status: AC
  Administered 2017-11-05: 10:00:00 via INTRAVENOUS

## 2017-11-05 MED ORDER — SODIUM CHLORIDE 0.9 % IV SOLN
Freq: Once | INTRAVENOUS | Status: AC
  Administered 2017-11-05: 11:00:00 via INTRAVENOUS
  Filled 2017-11-05: qty 5

## 2017-11-05 MED ORDER — DIPHENHYDRAMINE HCL 50 MG/ML IJ SOLN
50.0000 mg | Freq: Once | INTRAMUSCULAR | Status: AC
  Administered 2017-11-05: 50 mg via INTRAVENOUS

## 2017-11-05 MED ORDER — SODIUM CHLORIDE 0.9 % IV SOLN
682.8000 mg | Freq: Once | INTRAVENOUS | Status: AC
  Administered 2017-11-05: 680 mg via INTRAVENOUS
  Filled 2017-11-05: qty 68

## 2017-11-05 MED ORDER — HEPARIN SOD (PORK) LOCK FLUSH 100 UNIT/ML IV SOLN
500.0000 [IU] | Freq: Once | INTRAVENOUS | Status: AC | PRN
  Administered 2017-11-05: 500 [IU]
  Filled 2017-11-05: qty 5

## 2017-11-05 NOTE — Assessment & Plan Note (Signed)
This has resolved Her elevated platelet count is related to that Observe only

## 2017-11-05 NOTE — Progress Notes (Signed)
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 Soft tissue, biopsy, right medial thigh MATURE LIPOMA 3. Ovary and fallopian tube, right ENDOMETRIOMA AND SIMPLE SEROUS CYST WITH FALLOPIAN TUBE ADHESION NEGATIVE FOR MALIGNANCY 4. Omentum, resection for tumor BENIGN OMENTUM NEGATIVE FOR CARCINOMA 5. Lymph nodes, regional resection, right pelvic EIGHT BENIGN LYMPH NODES (0/8) 6. Lymph nodes, regional resection, left pelvic SEVEN BENIGN LYMPH NODES (0/7) 7. Lymph node, biopsy, right para-aortic FOUR BENIGN LYMPH NODES (0/4) 8. Lymph node, biopsy, left para-aortic ONE BENIGN LYMPH NODE (0/1) 9. Peritoneum, biopsy, left abdominal BENIGN FIBROMUSCULAR TISSUE 10. Peritoneum, biopsy, right abdominal BENIGN FIBROMUSCULAR TISSUE WITH SEROSITIS 11. Peritoneum, biopsy, pelvic FIBROADIPOSE TISSUE WITH SEROSITIS  Specimen(s): Ovary and fallopian tube Procedure: (including lymph node sampling): salpingo-oophorectomy Primary tumor site (including laterality): Left ovary Ovarian surface involvement: Yes Ovarian capsule intact without fragmentation: intact Maximum tumor size (cm): 20.0 cm Histologic type: Endometrioid carcinoma Grade: 1 Peritoneal implants: (specify invasive or non-invasive): Negative Pelvic extension (list additional structures on separate lines and  if involved): Negative Lymph nodes: number examined 20 ; number positive 0 TNM code: pT1c2, pN0, pMx FIGO Stage (based on pathologic findings, needs clinical correlation): IC2      09/11/2017 Surgery    Preoperative Diagnosis: 1. left adnexal mass.  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   Procedure(s) Performed: 1. Exploratory laparotomy with bilateral salpingo-oophorectomy, pelvic and para-aortic lymph node dissection, omentectomy and radical debulking for ovarian cancer (CPT [id number])  Surgeon: Everitt Amber, M.D. Operative Findings:20 cm left ovarian mass.   No intraperitoneal rupture occurred;  Frozen pathology was consistent with adenocarcinoma; right tube and ovary normal in appearance; 3) normal bilateral pelvic and para-aortic lymph nodes and omentum; small and large bowel to palpation. This represented an optimal cytoreduction with no gross visible disease remaining.       10/09/2017 Tumor Marker    Patient's tumor was tested for the following markers: CA-125 Results of the tumor marker test revealed 68.7      10/15/2017 Adverse Reaction    She developed reaction to Paclitaxel.      10/15/2017 -  Chemotherapy    She received carboplatin. Due to infusion reaction to Taxol with cycle 1, treatment was switched to carboplatin and Abraxane      11/03/2017 Procedure    Successful placement of a right internal jugular approach power injectable Port-A-Cath. The catheter is ready for immediate use.       INTERVAL HISTORY: Please see below for problem oriented charting. She returns with her mother for further follow-up She complained of mild bruising near the port area She also complained mild paresthesia of her right thigh since surgery but is not debilitating She complained of fatigue after recent chemotherapy She had infusion reaction to Taxol and the treatment was not completed She only received carboplatin and  minimum doses of  Taxol. The decision was to switch her treatment to Abraxane  REVIEW OF SYSTEMS:   Constitutional: Denies fevers, chills or abnormal weight loss Eyes: Denies blurriness of vision Ears, nose, mouth, throat, and face: Denies mucositis or sore throat Respiratory: Denies cough, dyspnea or wheezes Cardiovascular: Denies palpitation, chest discomfort or lower extremity swelling Gastrointestinal:  Denies nausea, heartburn or change in bowel habits Skin: Denies abnormal skin rashes Lymphatics: Denies new lymphadenopathy or easy bruising Behavioral/Psych: Mood is stable, no new changes  All other systems were reviewed with the patient and are negative.  I have reviewed the past medical history, past surgical history, social history and family history with the patient and they are unchanged from previous note.  ALLERGIES:  is allergic to bee venom; chlorhexidine; paclitaxel; other; bacitracin; and iodine.  MEDICATIONS:  Current Outpatient Medications  Medication Sig Dispense Refill  . acetaminophen (TYLENOL) 325 MG tablet Take 650 mg 2 (two) times daily as needed by mouth for headache.    Marland Kitchen amLODipine (NORVASC) 5 MG tablet Take 5 mg by mouth every evening.    . Ascorbic Acid (VITAMIN C) 1000 MG tablet Take 1,000 mg by mouth every morning.     . lidocaine-prilocaine (EMLA) cream Apply 1 application topically as needed. 30 g 6  . Liniments (SALONPAS PAIN RELIEF PATCH EX) Place 1 patch onto the skin daily as needed (knee pain).    Marland Kitchen loratadine (CLARITIN) 10 MG tablet Take 10 mg by mouth every morning.     . Melatonin 1 MG CAPS Take 1 mg by mouth at bedtime.    . Menthol, Topical Analgesic, 4 % GEL Apply 1 application daily as needed topically (headache).    . Methylsulfonylmethane (MSM) 1000 MG CAPS Take 1,000 mg by mouth every morning.     . Multiple Vitamins-Minerals (HAIR/SKIN/NAILS) TABS Take 2 tablets by mouth every morning.    . ondansetron (ZOFRAN) 8 MG tablet Take 1 tablet (8 mg total) by  mouth every 8 (eight) hours as needed for refractory nausea / vomiting. Start on day 3 after chemo. 60 tablet 1  . prochlorperazine (COMPAZINE) 10 MG tablet Take 1 tablet (10 mg total) by mouth every 6 (six) hours as needed (Nausea or vomiting). (Patient not taking: Reported on 10/22/2017) 30 tablet 1  . UNABLE TO FIND CPAP     No current facility-administered medications for this visit.     PHYSICAL EXAMINATION: ECOG PERFORMANCE STATUS: 1 - Symptomatic but completely ambulatory  Vitals:   11/05/17 0845  BP: (!) 141/81  Pulse: (!) 123  Resp: 18  Temp: 98.1 F (36.7 C)  SpO2: 99%   Filed Weights   11/05/17 0845  Weight: 177 lb 4.8 oz (80.4 kg)    GENERAL:alert, no distress and comfortable SKIN: skin color, texture, turgor are normal, no rashes or significant lesions EYES: normal, Conjunctiva are pink and non-injected, sclera clear OROPHARYNX:no exudate, no erythema and lips, buccal mucosa, and tongue normal  NECK: supple, thyroid normal size, non-tender, without nodularity LYMPH:  no palpable lymphadenopathy in the cervical, axillary or inguinal LUNGS: clear to auscultation and percussion with normal breathing effort HEART: regular rate & rhythm and no murmurs and no lower extremity edema ABDOMEN:abdomen soft, non-tender and normal bowel sounds Musculoskeletal:no cyanosis of digits and no clubbing  NEURO: alert & oriented x 3 with fluent speech, no focal motor/sensory deficits  LABORATORY DATA:  I have reviewed the data as listed    Component Value Date/Time   NA 139  11/05/2017 0835   K 4.1 11/05/2017 0835   CL 100 (L) 10/22/2017 1411   CO2 23 11/05/2017 0835   GLUCOSE 209 (H) 11/05/2017 0835   BUN 8.6 11/05/2017 0835   CREATININE 0.9 11/05/2017 0835   CALCIUM 10.5 (H) 11/05/2017 0835   PROT 8.3 11/05/2017 0835   ALBUMIN 2.9 (L) 11/05/2017 0835   AST 9 11/05/2017 0835   ALT 12 11/05/2017 0835   ALKPHOS 75 11/05/2017 0835   BILITOT 0.24 11/05/2017 0835   GFRNONAA  >60 10/22/2017 1411   GFRAA >60 10/22/2017 1411    No results found for: SPEP, UPEP  Lab Results  Component Value Date   WBC 10.0 11/05/2017   NEUTROABS 8.6 (H) 11/05/2017   HGB 10.1 (L) 11/05/2017   HCT 32.5 (L) 11/05/2017   MCV 84.4 11/05/2017   PLT 927 (H) 11/05/2017      Chemistry      Component Value Date/Time   NA 139 11/05/2017 0835   K 4.1 11/05/2017 0835   CL 100 (L) 10/22/2017 1411   CO2 23 11/05/2017 0835   BUN 8.6 11/05/2017 0835   CREATININE 0.9 11/05/2017 0835      Component Value Date/Time   CALCIUM 10.5 (H) 11/05/2017 0835   ALKPHOS 75 11/05/2017 0835   AST 9 11/05/2017 0835   ALT 12 11/05/2017 0835   BILITOT 0.24 11/05/2017 0835       RADIOGRAPHIC STUDIES: I have personally reviewed the radiological images as listed and agreed with the findings in the report. Dg Chest 2 View  Result Date: 10/22/2017 CLINICAL DATA:  Two days of fever which is higher today. Occasional cough. History of hypertension, nonsmoker. EXAM: CHEST  2 VIEW COMPARISON:  None in PACs FINDINGS: The lungs are adequately inflated and clear. The heart and pulmonary vascularity are normal. The mediastinum is normal in width. The bony thorax is unremarkable. IMPRESSION: There is no pneumonia nor other acute cardiopulmonary abnormality. Electronically Signed   By: David  Martinique M.D.   On: 10/22/2017 15:46   Ir US Guide Vasc Access Right  Result Date: 11/03/2017 INDICATION: History of ovarian cancer. In need of durable intravenous access for chemotherapy administration. EXAM: IMPLANTED PORT A CATH PLACEMENT WITH ULTRASOUND AND FLUOROSCOPIC GUIDANCE COMPARISON:  None. MEDICATIONS: Ancef 2 gm IV; The antibiotic was administered within an appropriate time interval prior to skin puncture. ANESTHESIA/SEDATION: Moderate (conscious) sedation was employed during this procedure. A total of Versed 4 mg and Fentanyl 100 mcg was administered intravenously. Moderate Sedation Time: 23 minutes. The  patient's level of consciousness and vital signs were monitored continuously by radiology nursing throughout the procedure under my direct supervision. CONTRAST:  None FLUOROSCOPY TIME:  30 seconds (8 mGy) COMPLICATIONS: None immediate. PROCEDURE: The procedure, risks, benefits, and alternatives were explained to the patient. Questions regarding the procedure were encouraged and answered. The patient understands and consents to the procedure. The right neck and chest were prepped with chlorhexidine in a sterile fashion, and a sterile drape was applied covering the operative field. Maximum barrier sterile technique with sterile gowns and gloves were used for the procedure. A timeout was performed prior to the initiation of the procedure. Local anesthesia was provided with 1% lidocaine with epinephrine. After creating a small venotomy incision, a micropuncture kit was utilized to access the internal jugular vein. Real-time ultrasound guidance was utilized for vascular access including the acquisition of a permanent ultrasound image documenting patency of the accessed vessel. The microwire was utilized to measure appropriate catheter length.  A subcutaneous port pocket was then created along the upper chest wall utilizing a combination of sharp and blunt dissection. The pocket was irrigated with sterile saline. A single lumen thin power injectable port was chosen for placement. The 8 Fr catheter was tunneled from the port pocket site to the venotomy incision. The port was placed in the pocket. The external catheter was trimmed to appropriate length. At the venotomy, an 8 Fr peel-away sheath was placed over a guidewire under fluoroscopic guidance. The catheter was then placed through the sheath and the sheath was removed. Final catheter positioning was confirmed and documented with a fluoroscopic spot radiograph. The port was accessed with a Huber needle, aspirated and flushed with heparinized saline. The venotomy site  was closed with an interrupted 4-0 Vicryl suture. The port pocket incision was closed with interrupted 2-0 Vicryl suture and the skin was opposed with a running subcuticular 4-0 Vicryl suture. Dermabond and Steri-strips were applied to both incisions. Dressings were placed. The patient tolerated the procedure well without immediate post procedural complication. FINDINGS: After catheter placement, the tip lies within the superior cavoatrial junction. The catheter aspirates and flushes normally and is ready for immediate use. IMPRESSION: Successful placement of a right internal jugular approach power injectable Port-A-Cath. The catheter is ready for immediate use. Electronically Signed   By: Sandi Mariscal M.D.   On: 11/03/2017 16:11   Ir Fluoro Guide Port Insertion Right  Result Date: 11/03/2017 INDICATION: History of ovarian cancer. In need of durable intravenous access for chemotherapy administration. EXAM: IMPLANTED PORT A CATH PLACEMENT WITH ULTRASOUND AND FLUOROSCOPIC GUIDANCE COMPARISON:  None. MEDICATIONS: Ancef 2 gm IV; The antibiotic was administered within an appropriate time interval prior to skin puncture. ANESTHESIA/SEDATION: Moderate (conscious) sedation was employed during this procedure. A total of Versed 4 mg and Fentanyl 100 mcg was administered intravenously. Moderate Sedation Time: 23 minutes. The patient's level of consciousness and vital signs were monitored continuously by radiology nursing throughout the procedure under my direct supervision. CONTRAST:  None FLUOROSCOPY TIME:  30 seconds (8 mGy) COMPLICATIONS: None immediate. PROCEDURE: The procedure, risks, benefits, and alternatives were explained to the patient. Questions regarding the procedure were encouraged and answered. The patient understands and consents to the procedure. The right neck and chest were prepped with chlorhexidine in a sterile fashion, and a sterile drape was applied covering the operative field. Maximum barrier  sterile technique with sterile gowns and gloves were used for the procedure. A timeout was performed prior to the initiation of the procedure. Local anesthesia was provided with 1% lidocaine with epinephrine. After creating a small venotomy incision, a micropuncture kit was utilized to access the internal jugular vein. Real-time ultrasound guidance was utilized for vascular access including the acquisition of a permanent ultrasound image documenting patency of the accessed vessel. The microwire was utilized to measure appropriate catheter length. A subcutaneous port pocket was then created along the upper chest wall utilizing a combination of sharp and blunt dissection. The pocket was irrigated with sterile saline. A single lumen thin power injectable port was chosen for placement. The 8 Fr catheter was tunneled from the port pocket site to the venotomy incision. The port was placed in the pocket. The external catheter was trimmed to appropriate length. At the venotomy, an 8 Fr peel-away sheath was placed over a guidewire under fluoroscopic guidance. The catheter was then placed through the sheath and the sheath was removed. Final catheter positioning was confirmed and documented with a fluoroscopic spot radiograph. The  port was accessed with a Huber needle, aspirated and flushed with heparinized saline. The venotomy site was closed with an interrupted 4-0 Vicryl suture. The port pocket incision was closed with interrupted 2-0 Vicryl suture and the skin was opposed with a running subcuticular 4-0 Vicryl suture. Dermabond and Steri-strips were applied to both incisions. Dressings were placed. The patient tolerated the procedure well without immediate post procedural complication. FINDINGS: After catheter placement, the tip lies within the superior cavoatrial junction. The catheter aspirates and flushes normally and is ready for immediate use. IMPRESSION: Successful placement of a right internal jugular approach power  injectable Port-A-Cath. The catheter is ready for immediate use. Electronically Signed   By: Sandi Mariscal M.D.   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[place]) Unfortunately, she developed significant infusion reaction to Taxol The decision is to proceed to change her treatment to Abraxane I have previous good success with Abraxane We discussed the risks, benefits, side effects of switching treatment and she agreed to proceed She will continue carboplatin as well She is reminded not to take premedications in the future  Open abdominal incision with drainage This has resolved Her elevated platelet count is related to that Observe only  Reactive thrombocytosis She has significant reactive thrombocytosis Observe only  Hypercalcemia She has mild hypercalcemia likely secondary to mild dehydration Observe only  Paresthesia of right leg She has mild paresthesia on the internal right thigh since surgery, likely due to mild nerve injury It is not debilitating We will observe only for now  Physical debility She has mild physical disability since surgery and with multiple other side effects such as fatigue and recent infection/fever I recommend her not to return back to work until chemotherapy is completed   No orders of the defined types were placed in this encounter.  All questions were answered. The patient knows to call the clinic with any problems, questions or concerns. No barriers to learning was detected. I spent 25 minutes counseling the patient face to face. The total time spent in the appointment was 30 minutes and more than 50% was on counseling and review of test results     Heath Lark, MD 11/05/2017 9:36 AM

## 2017-11-05 NOTE — Patient Instructions (Addendum)
[facility] Discharge Instructions for Patients Receiving Chemotherapy  Today you received the following chemotherapy agents:  Paclitaxel Protein Bound (ABRAXANE) and Carboplatin.  To help prevent nausea and vomiting after your treatment, we encourage you to take your nausea medication as directed.   If you develop nausea and vomiting that is not controlled by your nausea medication, call the clinic.   BELOW ARE SYMPTOMS THAT SHOULD BE REPORTED IMMEDIATELY:  *FEVER GREATER THAN 100.5 F  *CHILLS WITH OR WITHOUT FEVER  NAUSEA AND VOMITING THAT IS NOT CONTROLLED WITH YOUR NAUSEA MEDICATION  *UNUSUAL SHORTNESS OF BREATH  *UNUSUAL BRUISING OR BLEEDING  TENDERNESS IN MOUTH AND THROAT WITH OR WITHOUT PRESENCE OF ULCERS  *URINARY PROBLEMS  *BOWEL PROBLEMS  UNUSUAL RASH Items with * indicate a potential emergency and should be followed up as soon as possible.  Feel free to call the clinic should you have any questions or concerns. The clinic phone number is [phone].  Please show the [facility] at check-in to the Emergency Department and triage nurse.

## 2017-11-05 NOTE — Assessment & Plan Note (Signed)
Unfortunately, she developed significant infusion reaction to Taxol The decision is to proceed to change her treatment to Abraxane I have previous good success with Abraxane We discussed the risks, benefits, side effects of switching treatment and she agreed to proceed She will continue carboplatin as well She is reminded not to take premedications in the future

## 2017-11-05 NOTE — Telephone Encounter (Signed)
Scheduled appt per 11/28 los - patient to get an updated schedule in the treatment area.

## 2017-11-05 NOTE — Assessment & Plan Note (Signed)
She has mild physical disability since surgery and with multiple other side effects such as fatigue and recent infection/fever I recommend her not to return back to work until chemotherapy is completed

## 2017-11-05 NOTE — Assessment & Plan Note (Signed)
She has mild paresthesia on the internal right thigh since surgery, likely due to mild nerve injury It is not debilitating We will observe only for now

## 2017-11-05 NOTE — Assessment & Plan Note (Addendum)
She has mild hypercalcemia likely secondary to mild dehydration Observe only

## 2017-11-05 NOTE — Assessment & Plan Note (Signed)
She has significant reactive thrombocytosis Observe only

## 2017-11-07 ENCOUNTER — Other Ambulatory Visit: Payer: BLUE CROSS/BLUE SHIELD

## 2017-11-07 ENCOUNTER — Telehealth: Payer: Self-pay | Admitting: Hematology and Oncology

## 2017-11-07 NOTE — Telephone Encounter (Signed)
11/07/2017 called @ 5:11pm to [phone] and spoke with the patient to inform her that her Short Term Disability forms were successfully faxed on 11/06/2017 @ 11:40 am

## 2017-11-08 ENCOUNTER — Other Ambulatory Visit: Payer: Self-pay | Admitting: Internal Medicine

## 2017-11-08 DIAGNOSIS — R5381 Other malaise: Secondary | ICD-10-CM

## 2017-11-10 ENCOUNTER — Other Ambulatory Visit: Payer: Self-pay | Admitting: Internal Medicine

## 2017-11-10 DIAGNOSIS — Z139 Encounter for screening, unspecified: Secondary | ICD-10-CM

## 2017-11-26 ENCOUNTER — Telehealth: Payer: Self-pay | Admitting: Genetics

## 2017-11-26 NOTE — Telephone Encounter (Signed)
Called to inform patient we will be taking an additional tube of blood when she has blood drawn tomorrow morning for genetic testing- this not mean she has to get testing, but will save having to do a separate blood draw if she would like testing.

## 2017-11-27 ENCOUNTER — Ambulatory Visit: Payer: BLUE CROSS/BLUE SHIELD

## 2017-11-27 ENCOUNTER — Encounter: Payer: Self-pay | Admitting: Genetics

## 2017-11-27 ENCOUNTER — Ambulatory Visit (HOSPITAL_BASED_OUTPATIENT_CLINIC_OR_DEPARTMENT_OTHER): Payer: BLUE CROSS/BLUE SHIELD | Admitting: Hematology and Oncology

## 2017-11-27 ENCOUNTER — Ambulatory Visit (HOSPITAL_BASED_OUTPATIENT_CLINIC_OR_DEPARTMENT_OTHER): Payer: BLUE CROSS/BLUE SHIELD | Admitting: Genetics

## 2017-11-27 ENCOUNTER — Other Ambulatory Visit (HOSPITAL_BASED_OUTPATIENT_CLINIC_OR_DEPARTMENT_OTHER): Payer: BLUE CROSS/BLUE SHIELD

## 2017-11-27 ENCOUNTER — Ambulatory Visit (HOSPITAL_BASED_OUTPATIENT_CLINIC_OR_DEPARTMENT_OTHER): Payer: BLUE CROSS/BLUE SHIELD

## 2017-11-27 ENCOUNTER — Encounter: Payer: Self-pay | Admitting: Hematology and Oncology

## 2017-11-27 DIAGNOSIS — C562 Malignant neoplasm of left ovary: Secondary | ICD-10-CM

## 2017-11-27 DIAGNOSIS — Z7183 Encounter for nonprocreative genetic counseling: Secondary | ICD-10-CM

## 2017-11-27 DIAGNOSIS — Z5111 Encounter for antineoplastic chemotherapy: Secondary | ICD-10-CM

## 2017-11-27 DIAGNOSIS — R202 Paresthesia of skin: Secondary | ICD-10-CM

## 2017-11-27 DIAGNOSIS — Z8042 Family history of malignant neoplasm of prostate: Secondary | ICD-10-CM

## 2017-11-27 DIAGNOSIS — Z800 Family history of malignant neoplasm of digestive organs: Secondary | ICD-10-CM

## 2017-11-27 DIAGNOSIS — K5909 Other constipation: Secondary | ICD-10-CM | POA: Diagnosis not present

## 2017-11-27 DIAGNOSIS — Z95828 Presence of other vascular implants and grafts: Secondary | ICD-10-CM

## 2017-11-27 HISTORY — DX: Family history of malignant neoplasm of prostate: Z80.42

## 2017-11-27 HISTORY — DX: Family history of malignant neoplasm of digestive organs: Z80.0

## 2017-11-27 LAB — CBC WITH DIFFERENTIAL/PLATELET
BASO%: 1.4 % (ref 0.0–2.0)
BASOS ABS: 0.1 10*3/uL (ref 0.0–0.1)
EOS%: 3.6 % (ref 0.0–7.0)
Eosinophils Absolute: 0.1 10*3/uL (ref 0.0–0.5)
HCT: 30.2 % — ABNORMAL LOW (ref 34.8–46.6)
HEMOGLOBIN: 10.1 g/dL — AB (ref 11.6–15.9)
LYMPH%: 28.9 % (ref 14.0–49.7)
MCH: 27.7 pg (ref 25.1–34.0)
MCHC: 33.3 g/dL (ref 31.5–36.0)
MCV: 83.2 fL (ref 79.5–101.0)
MONO#: 0.5 10*3/uL (ref 0.1–0.9)
MONO%: 11.4 % (ref 0.0–14.0)
NEUT#: 2.2 10*3/uL (ref 1.5–6.5)
NEUT%: 54.7 % (ref 38.4–76.8)
Platelets: 294 10*3/uL (ref 145–400)
RBC: 3.63 10*6/uL — AB (ref 3.70–5.45)
RDW: 22.5 % — ABNORMAL HIGH (ref 11.2–14.5)
WBC: 4 10*3/uL (ref 3.9–10.3)
lymph#: 1.2 10*3/uL (ref 0.9–3.3)

## 2017-11-27 LAB — COMPREHENSIVE METABOLIC PANEL
ALT: 11 U/L (ref 0–55)
ANION GAP: 9 meq/L (ref 3–11)
AST: 11 U/L (ref 5–34)
Albumin: 3.8 g/dL (ref 3.5–5.0)
Alkaline Phosphatase: 68 U/L (ref 40–150)
BILIRUBIN TOTAL: 0.41 mg/dL (ref 0.20–1.20)
BUN: 12.9 mg/dL (ref 7.0–26.0)
CO2: 25 meq/L (ref 22–29)
CREATININE: 0.8 mg/dL (ref 0.6–1.1)
Calcium: 9.9 mg/dL (ref 8.4–10.4)
Chloride: 106 mEq/L (ref 98–109)
EGFR: >60 mL/min/{1.73_m2} (ref >60)
Glucose: 90 mg/dl (ref 70–140)
Potassium: 3.8 mEq/L (ref 3.5–5.1)
Sodium: 141 mEq/L (ref 136–145)
TOTAL PROTEIN: 7 g/dL (ref 6.4–8.3)

## 2017-11-27 MED ORDER — PACLITAXEL PROTEIN-BOUND CHEMO INJECTION 100 MG
125.0000 mg/m2 | Freq: Once | INTRAVENOUS | Status: AC
  Administered 2017-11-27: 250 mg via INTRAVENOUS
  Filled 2017-11-27: qty 50

## 2017-11-27 MED ORDER — HEPARIN SOD (PORK) LOCK FLUSH 100 UNIT/ML IV SOLN
500.0000 [IU] | Freq: Once | INTRAVENOUS | Status: AC | PRN
  Administered 2017-11-27: 500 [IU]
  Filled 2017-11-27: qty 5

## 2017-11-27 MED ORDER — PALONOSETRON HCL INJECTION 0.25 MG/5ML
0.2500 mg | Freq: Once | INTRAVENOUS | Status: AC
  Administered 2017-11-27: 0.25 mg via INTRAVENOUS

## 2017-11-27 MED ORDER — PALONOSETRON HCL INJECTION 0.25 MG/5ML
INTRAVENOUS | Status: AC
  Filled 2017-11-27: qty 5

## 2017-11-27 MED ORDER — SODIUM CHLORIDE 0.9% FLUSH
10.0000 mL | INTRAVENOUS | Status: DC | PRN
  Administered 2017-11-27: 10 mL
  Filled 2017-11-27: qty 10

## 2017-11-27 MED ORDER — SODIUM CHLORIDE 0.9% FLUSH
10.0000 mL | INTRAVENOUS | Status: DC | PRN
  Administered 2017-11-27: 10 mL via INTRAVENOUS
  Filled 2017-11-27: qty 10

## 2017-11-27 MED ORDER — SODIUM CHLORIDE 0.9 % IV SOLN
Freq: Once | INTRAVENOUS | Status: AC
  Administered 2017-11-27: 10:00:00 via INTRAVENOUS

## 2017-11-27 MED ORDER — SODIUM CHLORIDE 0.9 % IV SOLN
680.0000 mg | Freq: Once | INTRAVENOUS | Status: AC
  Administered 2017-11-27: 680 mg via INTRAVENOUS
  Filled 2017-11-27: qty 68

## 2017-11-27 MED ORDER — SODIUM CHLORIDE 0.9 % IV SOLN
Freq: Once | INTRAVENOUS | Status: AC
  Administered 2017-11-27: 10:00:00 via INTRAVENOUS
  Filled 2017-11-27: qty 5

## 2017-11-27 NOTE — Assessment & Plan Note (Signed)
We discussed laxative management.

## 2017-11-27 NOTE — Assessment & Plan Note (Signed)
She tolerated recent chemotherapy well except for mild constipation She started to develop alopecia She denies peripheral neuropathy We would proceed with treatment today without dose adjustment

## 2017-11-27 NOTE — Patient Instructions (Addendum)
[facility] Discharge Instructions for Patients Receiving Chemotherapy  Today you received the following chemotherapy agents:  Abraxane and Carboplatin.  To help prevent nausea and vomiting after your treatment, we encourage you to take your nausea medication as directed.   If you develop nausea and vomiting that is not controlled by your nausea medication, call the clinic.   BELOW ARE SYMPTOMS THAT SHOULD BE REPORTED IMMEDIATELY:  *FEVER GREATER THAN 100.5 F  *CHILLS WITH OR WITHOUT FEVER  NAUSEA AND VOMITING THAT IS NOT CONTROLLED WITH YOUR NAUSEA MEDICATION  *UNUSUAL SHORTNESS OF BREATH  *UNUSUAL BRUISING OR BLEEDING  TENDERNESS IN MOUTH AND THROAT WITH OR WITHOUT PRESENCE OF ULCERS  *URINARY PROBLEMS  *BOWEL PROBLEMS  UNUSUAL RASH Items with * indicate a potential emergency and should be followed up as soon as possible.  Feel free to call the clinic should you have any questions or concerns. The clinic phone number is [phone].  Please show the [place] at check-in to the Emergency Department and triage nurse.  Carboplatin injection What is this medicine? CARBOPLATIN (KAR boe pla tin) is a chemotherapy drug. It targets fast dividing cells, like cancer cells, and causes these cells to die. This medicine is used to treat ovarian cancer and many other cancers. This medicine may be used for other purposes; ask your health care provider or pharmacist if you have questions. COMMON BRAND NAME(S): Paraplatin What should I tell my health care provider before I take this medicine? They need to know if you have any of these conditions: -blood disorders -hearing problems -kidney disease -recent or ongoing radiation therapy -an unusual or allergic reaction to carboplatin, cisplatin, other chemotherapy, other medicines, foods, dyes, or preservatives -pregnant or trying to get pregnant -breast-feeding How should I use this medicine? This drug is  usually given as an infusion into a vein. It is administered in a hospital or clinic by a specially trained health care professional. Talk to your pediatrician regarding the use of this medicine in children. Special care may be needed. Overdosage: If you think you have taken too much of this medicine contact a poison control center or emergency room at once. NOTE: This medicine is only for you. Do not share this medicine with others. What if I miss a dose? It is important not to miss a dose. Call your doctor or health care professional if you are unable to keep an appointment. What may interact with this medicine? -medicines for seizures -medicines to increase blood counts like filgrastim, pegfilgrastim, sargramostim -some antibiotics like amikacin, gentamicin, neomycin, streptomycin, tobramycin -vaccines Talk to your doctor or health care professional before taking any of these medicines: -acetaminophen -aspirin -ibuprofen -ketoprofen -naproxen This list may not describe all possible interactions. Give your health care provider a list of all the medicines, herbs, non-prescription drugs, or dietary supplements you use. Also tell them if you smoke, drink alcohol, or use illegal drugs. Some items may interact with your medicine. What should I watch for while using this medicine? Your condition will be monitored carefully while you are receiving this medicine. You will need important blood work done while you are taking this medicine. This drug may make you feel generally unwell. This is not uncommon, as chemotherapy can affect healthy cells as well as cancer cells. Report any side effects. Continue your course of treatment even though you feel ill unless your doctor tells you to stop. In some cases, you may be given additional medicines to help with side  effects. Follow all directions for their use. Call your doctor or health care professional for advice if you get a fever, chills or sore throat,  or other symptoms of a cold or flu. Do not treat yourself. This drug decreases your body's ability to fight infections. Try to avoid being around people who are sick. This medicine may increase your risk to bruise or bleed. Call your doctor or health care professional if you notice any unusual bleeding. Be careful brushing and flossing your teeth or using a toothpick because you may get an infection or bleed more easily. If you have any dental work done, tell your dentist you are receiving this medicine. Avoid taking products that contain aspirin, acetaminophen, ibuprofen, naproxen, or ketoprofen unless instructed by your doctor. These medicines may hide a fever. Do not become pregnant while taking this medicine. Women should inform their doctor if they wish to become pregnant or think they might be pregnant. There is a potential for serious side effects to an unborn child. Talk to your health care professional or pharmacist for more information. Do not breast-feed an infant while taking this medicine. What side effects may I notice from receiving this medicine? Side effects that you should report to your doctor or health care professional as soon as possible: -allergic reactions like skin rash, itching or hives, swelling of the face, lips, or tongue -signs of infection - fever or chills, cough, sore throat, pain or difficulty passing urine -signs of decreased platelets or bleeding - bruising, pinpoint red spots on the skin, black, tarry stools, nosebleeds -signs of decreased red blood cells - unusually weak or tired, fainting spells, lightheadedness -breathing problems -changes in hearing -changes in vision -chest pain -high blood pressure -low blood counts - This drug may decrease the number of white blood cells, red blood cells and platelets. You may be at increased risk for infections and bleeding. -nausea and vomiting -pain, swelling, redness or irritation at the injection site -pain,  tingling, numbness in the hands or feet -problems with balance, talking, walking -trouble passing urine or change in the amount of urine Side effects that usually do not require medical attention (report to your doctor or health care professional if they continue or are bothersome): -hair loss -loss of appetite -metallic taste in the mouth or changes in taste This list may not describe all possible side effects. Call your doctor for medical advice about side effects. You may report side effects to FDA at 1-800-FDA-1088. Where should I keep my medicine? This drug is given in a hospital or clinic and will not be stored at home. NOTE: This sheet is a summary. It may not cover all possible information. If you have questions about this medicine, talk to your doctor, pharmacist, or health care provider.  2018 [employer] Standard (2008-03-01 14:38:05)  Nanoparticle Albumin-Bound Paclitaxel injection (Abraxane) What is this medicine? NANOPARTICLE ALBUMIN-BOUND PACLITAXEL (Na no PAHR ti kuhl al BYOO muhn-bound PAK li TAX el) is a chemotherapy drug. It targets fast dividing cells, like cancer cells, and causes these cells to die. This medicine is used to treat advanced breast cancer and advanced lung cancer. This medicine may be used for other purposes; ask your health care provider or pharmacist if you have questions. COMMON BRAND NAME(S): Abraxane What should I tell my health care provider before I take this medicine? They need to know if you have any of these conditions: -kidney disease -liver disease -low blood counts, like low platelets, red blood cells, or white  blood cells -recent or ongoing radiation therapy -an unusual or allergic reaction to paclitaxel, albumin, other chemotherapy, other medicines, foods, dyes, or preservatives -pregnant or trying to get pregnant -breast-feeding How should I use this medicine? This drug is given as an infusion into a vein. It is administered in a  hospital or clinic by a specially trained health care professional. Talk to your pediatrician regarding the use of this medicine in children. Special care may be needed. Overdosage: If you think you have taken too much of this medicine contact a poison control center or emergency room at once. NOTE: This medicine is only for you. Do not share this medicine with others. What if I miss a dose? It is important not to miss your dose. Call your doctor or health care professional if you are unable to keep an appointment. What may interact with this medicine? -cyclosporine -diazepam -ketoconazole -medicines to increase blood counts like filgrastim, pegfilgrastim, sargramostim -other chemotherapy drugs like cisplatin, doxorubicin, epirubicin, etoposide, teniposide, vincristine -quinidine -testosterone -vaccines -verapamil Talk to your doctor or health care professional before taking any of these medicines: -acetaminophen -aspirin -ibuprofen -ketoprofen -naproxen This list may not describe all possible interactions. Give your health care provider a list of all the medicines, herbs, non-prescription drugs, or dietary supplements you use. Also tell them if you smoke, drink alcohol, or use illegal drugs. Some items may interact with your medicine. What should I watch for while using this medicine? Your condition will be monitored carefully while you are receiving this medicine. You will need important blood work done while you are taking this medicine. This medicine can cause serious allergic reactions. If you experience allergic reactions like skin rash, itching or hives, swelling of the face, lips, or tongue, tell your doctor or health care professional right away. In some cases, you may be given additional medicines to help with side effects. Follow all directions for their use. This drug may make you feel generally unwell. This is not uncommon, as chemotherapy can affect healthy cells as well as  cancer cells. Report any side effects. Continue your course of treatment even though you feel ill unless your doctor tells you to stop. Call your doctor or health care professional for advice if you get a fever, chills or sore throat, or other symptoms of a cold or flu. Do not treat yourself. This drug decreases your body's ability to fight infections. Try to avoid being around people who are sick. This medicine may increase your risk to bruise or bleed. Call your doctor or health care professional if you notice any unusual bleeding. Be careful brushing and flossing your teeth or using a toothpick because you may get an infection or bleed more easily. If you have any dental work done, tell your dentist you are receiving this medicine. Avoid taking products that contain aspirin, acetaminophen, ibuprofen, naproxen, or ketoprofen unless instructed by your doctor. These medicines may hide a fever. Do not become pregnant while taking this medicine. Women should inform their doctor if they wish to become pregnant or think they might be pregnant. There is a potential for serious side effects to an unborn child. Talk to your health care professional or pharmacist for more information. Do not breast-feed an infant while taking this medicine. Men are advised not to father a child while receiving this medicine. What side effects may I notice from receiving this medicine? Side effects that you should report to your doctor or health care professional as soon as possible: -allergic  reactions like skin rash, itching or hives, swelling of the face, lips, or tongue -low blood counts - This drug may decrease the number of white blood cells, red blood cells and platelets. You may be at increased risk for infections and bleeding. -signs of infection - fever or chills, cough, sore throat, pain or difficulty passing urine -signs of decreased platelets or bleeding - bruising, pinpoint red spots on the skin, black, tarry  stools, nosebleeds -signs of decreased red blood cells - unusually weak or tired, fainting spells, lightheadedness -breathing problems -changes in vision -chest pain -high or low blood pressure -mouth sores -nausea and vomiting -pain, swelling, redness or irritation at the injection site -pain, tingling, numbness in the hands or feet -slow or irregular heartbeat -swelling of the ankle, feet, hands Side effects that usually do not require medical attention (report to your doctor or health care professional if they continue or are bothersome): -aches, pains -changes in the color of fingernails -diarrhea -hair loss -loss of appetite This list may not describe all possible side effects. Call your doctor for medical advice about side effects. You may report side effects to FDA at 1-800-FDA-1088. Where should I keep my medicine? This drug is given in a hospital or clinic and will not be stored at home. NOTE: This sheet is a summary. It may not cover all possible information. If you have questions about this medicine, talk to your doctor, pharmacist, or health care provider.  2018 [employer]/Gold Standard (2015-09-27 10:05:20)

## 2017-11-27 NOTE — Assessment & Plan Note (Signed)
She has mild paresthesia on the internal right thigh since surgery, likely due to mild nerve injury It is not debilitating, in fact, it is already improving on its own.  Observe only

## 2017-11-27 NOTE — Progress Notes (Signed)
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 Family history of pancreatic cancer   3. Family history of prostate cancer      HISTORY OF PRESENT ILLNESS:   Sierra Gray, a 56 y.o. female, was seen for a [place] cancer genetics consultation at the request of Dr. Alvy Bimler due to a personal and family history of cancer.  Sierra Gray presents to clinic today to discuss the possibility of a hereditary predisposition to cancer, genetic testing, and to further clarify her future cancer risks, as well as potential cancer risks for family members.   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 She had bilateral salpingectomy with oophorectomy on 09/11/2017.  She is now undergoing chemotherapy.     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 Soft tissue, biopsy, right medial thigh MATURE LIPOMA 3. Ovary and fallopian tube, right ENDOMETRIOMA AND SIMPLE SEROUS CYST WITH FALLOPIAN TUBE ADHESION NEGATIVE FOR MALIGNANCY 4. Omentum, resection for tumor BENIGN OMENTUM NEGATIVE FOR CARCINOMA 5. Lymph nodes, regional resection, right pelvic EIGHT BENIGN LYMPH NODES (0/8) 6. Lymph nodes, regional resection, left pelvic SEVEN BENIGN LYMPH NODES (0/7) 7. Lymph node, biopsy, right para-aortic FOUR  BENIGN LYMPH NODES (0/4) 8. Lymph node, biopsy, left para-aortic ONE BENIGN LYMPH NODE (0/1) 9. Peritoneum, biopsy, left abdominal BENIGN FIBROMUSCULAR TISSUE 10. Peritoneum, biopsy, right abdominal BENIGN FIBROMUSCULAR TISSUE WITH SEROSITIS 11. Peritoneum, biopsy, pelvic FIBROADIPOSE TISSUE WITH SEROSITIS  Specimen(s): Ovary and fallopian tube Procedure: (including lymph node sampling): salpingo-oophorectomy Primary tumor site (including laterality): Left ovary Ovarian surface involvement: Yes Ovarian capsule intact without fragmentation: intact Maximum tumor size (cm): 20.0 cm Histologic type: Endometrioid carcinoma Grade: 1 Peritoneal implants: (specify invasive or non-invasive): Negative Pelvic extension (list additional structures on separate lines and if involved): Negative Lymph nodes: number examined 20 ; number positive 0 TNM code: pT1c2, pN0, pMx FIGO Stage (based on pathologic findings, needs clinical correlation): IC2      09/11/2017 Surgery    Preoperative Diagnosis: 1. left adnexal mass.  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   Procedure(s) Performed: 1. Exploratory laparotomy with bilateral salpingo-oophorectomy, pelvic and para-aortic lymph node dissection, omentectomy and radical debulking for ovarian cancer (CPT [phone])  Surgeon: Everitt Amber, M.D. Operative Findings:20 cm left ovarian mass.   No intraperitoneal rupture occurred;  Frozen pathology was consistent with adenocarcinoma; right tube and ovary normal in appearance; 3) normal bilateral pelvic and para-aortic lymph nodes and omentum; small and large bowel to palpation. This represented an optimal cytoreduction with no gross visible disease remaining.       10/09/2017 Tumor Marker    Patient's tumor was tested for the following markers: CA-125 Results of the tumor marker test revealed 68.7      10/15/2017 Adverse Reaction  She developed reaction to  Paclitaxel.      10/15/2017 -  Chemotherapy    She received carboplatin. Due to infusion reaction to Taxol with cycle 1, treatment was switched to carboplatin and Abraxane      11/03/2017 Procedure    Successful placement of a right internal jugular approach power injectable Port-A-Cath. The catheter is ready for immediate use.        HORMONAL RISK FACTORS:  Menarche was at age 87.  First live birth at age N/A.  Ovaries intact: no.  Hysterectomy: yes.  Menopausal status: postmenopausal. 56 HRT use: 0 years. Mammogram within the last year: yes. Has her next scheduled in Jan 2019 Number of breast biopsies: 4 reports they were all cysts.    Past Medical History:  Diagnosis Date  . Anemia    hx of  . Arthritis    oa knees  . Breast lump in female    benign  . Family history of pancreatic cancer 11/27/2017  . Family history of prostate cancer 11/27/2017  . Fibroids   . Hyperlipidemia   . Hypertension   . Migraine    hx of   . Pelvic mass in female   . Pelvic mass in female   . PONV (postoperative nausea and vomiting)    nausea only  . Sleep apnea     Past Surgical History:  Procedure Laterality Date  . 2 lumps right breast removed  yrs ago  . ABDOMINAL HYSTERECTOMY  2009   Total laparoscopic hyst for fibroids  . BILATERAL SALPINGECTOMY Bilateral 09/11/2017   Procedure: BILATERAL SALPINGECTOMY WITH OOPHERECTOMY;  Surgeon: Everitt Amber, MD;  Location: [facility];  Service: Gynecology;  Laterality: Bilateral;  . BREAST BIOPSY  12/16/2014   2 lumps in  right breast removed  . EXCISION OF SKIN TAG N/A 09/11/2017   Procedure: EXCISION OF SKIN TAG ON THIGH;  Surgeon: Everitt Amber, MD;  Location: [facility];  Service: Gynecology;  Laterality: N/A;  . IR FLUORO GUIDE PORT INSERTION RIGHT  11/03/2017  . IR US GUIDE VASC ACCESS RIGHT  11/03/2017  . KNEE SURGERY Right    x3  . LAPAROTOMY Bilateral 09/11/2017   Procedure: EXPLORATORY LAPAROTOMY;  Surgeon: Everitt Amber, MD;  Location: [facility];  Service: Gynecology;  Laterality: Bilateral;  . LAPAROTOMY WITH STAGING N/A 09/11/2017   Procedure: PELVIC AND PARA AORTIC LYMPH NODE  DISSECTION;  Surgeon: Everitt Amber, MD;  Location: [facility];  Service: Gynecology;  Laterality: N/A;  . OMENTECTOMY N/A 09/11/2017   Procedure: OMENTECTOMY;  Surgeon: Everitt Amber, MD;  Location: [facility];  Service: Gynecology;  Laterality: N/A;  . REDUCTION MAMMAPLASTY    . WISDOM TOOTH EXTRACTION      Social History   Socioeconomic History  . Marital status: Single    Spouse name: None  . Number of children: None  . Years of education: None  . Highest education level: None  Social Needs  . Financial resource strain: None  . Food insecurity - worry: None  . Food insecurity - inability: None  . Transportation needs - medical: None  . Transportation needs - non-medical: None  Occupational History  . Occupation: Freight forwarder  Tobacco Use  . Smoking status: Never Smoker  . Smokeless tobacco: Never Used  Substance and Sexual Activity  . Alcohol use: Yes    Comment: Socially drinks wine/beer  . Drug use: No  . Sexual activity: No  Other Topics Concern  . None  Social History Narrative  .  None     FAMILY HISTORY:  We obtained a detailed, 4-generation family history.  Significant diagnoses are listed below: Family History  Problem Relation Age of Onset  . Hypertension Mother   . Hyperlipidemia Mother   . Pancreatic cancer Father 82       died 66 months after dx  . Hypertension Father   . Prostate cancer Father 2       had surgery for it  . Breast cancer Maternal Aunt        dx 73's- she had surgery, think it was cancer but could have been just a lump removed  . Heart disease Maternal Aunt   . Hyperlipidemia Maternal Uncle   . Diabetes Maternal Uncle   . Stroke Maternal Uncle   . Osteoporosis Maternal Uncle   . Heart attack Maternal Grandmother   . Diabetes Maternal Grandfather   . Dementia Maternal Grandfather   . Osteoporosis Paternal  Grandmother   . Colon cancer Paternal Grandfather 22   Sierra Gray has no children.  She has a 52 year-old sister with no history of cancer.  This sister has 4 sons in their 19's and 54's with no history of cancer.    Sierra Gray father had prostate caner at 103, he had surgery to treat this.  He then developed pancreatic cancer 1 year later at 45 and died 4 months after the diagnosis.  Sierra Gray has a paternal uncle who is 79 with no history of cancer.  This uncle has 2 daughters and a son int heir 79's with no history of cancer.  Sierra Gray paternal grandfather was diagnosed with colon cancer at 75 and died at 13.  Sierra Gray paternal grandmother did at 65 with no history of cancer.   Sierra Gray mother is 95 and has no history of cancer.  Her ovaries and uterus are intact.  Sierra Gray has 2 maternal uncles and 2 maternal autns.  One uncle died at 21 with no history of cancer and has 2 children without any history of cancer.  Her other maternal uncle is 41 with no history of cancer.  He has 3 children without any history of cancer.  One maternal aunt had surgery to remove a breast mass in her 39's.  They think it was breast cancer, but are not 100% sure- she reports it could have been some other type of lump (not cancer).  This aunt had 1 son who is 66 with no history of cancer. Sierra Gray other aunt is 39 with no history of cancer.  She had 2 sons and a daughter.  One of these sons (pateins' cousin) was diagnosed with liver cancer at 33 and is not 22.  He likely had a history of alcohol exposure.  Sierra Gray maternal grandparents died in their 61's with no history of cancer.   Sierra Gray is unaware of previous family history of genetic testing for hereditary cancer risks. Patient's maternal ancestors are of [place] descent, and paternal ancestors are of [place] European descent. There is no reported Ashkenazi Jewish ancestry. There is no known  consanguinity.  GENETIC COUNSELING ASSESSMENT: CRISTAN HOUT is a 56 y.o. female with a personal and family history which is somewhat suggestive of a Hereditary Cancer Predisposition Syndrome. We, therefore, discussed and recommended the following at today's visit.   DISCUSSION: We reviewed the characteristics, features and inheritance patterns of hereditary cancer syndromes. We also discussed genetic testing, including the appropriate family members to test, the  process of testing, insurance coverage and turn-around-time for results. We discussed the implications of a negative, positive and/or variant of uncertain significant result. We recommended Sierra Gray pursue genetic testing for the [facility] gene panel. The [facility] gene panel offered by [employer] includes sequencing and deletion/duplication testing of the following 29 genes: APC, ATM, BARD1, BMPR1A, BRCA1, BRCA2, BRIP1, CHD1, CDK4, CDKN2A, CHEK2, EPCAM (large rearrangement only), MLH1, MSH2, MSH6, MUTYH, NBN, PALB2, PMS2, PTEN, RAD51C, RAD51D, SMAD4, STK11, and TP53. Sequencing was performed for select regions of POLE and POLD1, and large rearrangement analysis was performed for select regions of GREM1, HOXB13 sequence analysis only.   We discussed that only 5-10% of cancers are associated with a Hereditary cancer predisposition syndrome.  One of the most common hereditary cancer syndromes that increases ovarian cancer risk is called Hereditary Breast and Ovarian Cancer (HBOC) syndrome.  This syndrome is caused by mutations in the BRCA1 and BRCA2 genes.  This syndrome increases an individual's lifetime risk to develop breast, ovarian, pancreatic, and other types of cancer.  There are also many other cancer predisposition syndromes caused by mutations in several other genes.    We discussed that if she is found to have a mutation in one of these genes, it may impact future medical management recommendations such as increased  cancer screenings and consideration of risk reducing surgeries.  A positive result could also have implications for the patient's family members.  A Negative result would mean we were unable to identify a hereditary component to her cancer, but does not rule out the possibility of a hereditary basis for her cancer.  There could be mutations that are undetectable by current technology, or in genes not yet tested or identified to increase cancer risk.    We discussed the potential to find a Variant of Uncertain Significance or VUS.  These are variants that have not yet been identified as pathogenic or benign, and it is unknown if this variant is associated with increased cancer risk or if this is a normal finding.  Most VUS's are reclassified to benign or likely benign.   It should not be used to make medical management decisions. With time, we suspect the lab will determine the significance of any VUS's identified if any.   We discussed that genetic testing through [facility] will test for hereditary mutations that could explain her diagnosis of cancer.  However, homologous recombination testing (HRD) is genetic testing performed on her tumor that can determine genetic changes that could influence her management.  HRD testing is performed in tandem with genetic testing, and typically at no additional cost.   Based on Sierra Gray's personal and family history of cancer, she meets medical criteria for genetic testing. Despite that she meets criteria, she may still have an out of pocket cost. We discussed that if her out of pocket cost for testing is over $100, the laboratory will call and confirm whether she wants to proceed with testing.  If the out of pocket cost of testing is less than $100 she will be billed by the genetic testing laboratory.   PLAN: After considering the risks, benefits, and limitations, Sierra Gray  provided informed consent to pursue genetic testing and the blood sample was sent to  [facility] for analysis of the [facility] HRD test. [facility] germline results should be available within approximately 2-3 weeks' time, at which point they will be disclosed by telephone to Sierra Gray, as will any additional recommendations warranted by these  results.  HRD tumor testing results often take a few weeks longer.  Sierra Gray will receive a summary of her genetic counseling visit and a copy of her results once available. This information will also be available in Epic. We encouraged Sierra Gray to remain in contact with cancer genetics annually so that we can continuously update the family history and inform her of any changes in cancer genetics and testing that may be of benefit for her family. Ms. Pleitez questions were answered to her satisfaction today. Our contact information was provided should additional questions or concerns arise.  Based on Ms. Kassab's family history, her siblings and other paternal relatives appear to meet medical criteria for genetic testing.  Ms. Vo will let us know if we can be of any assistance in coordinating genetic counseling and/or testing for this family member.   Lastly, we encouraged Ms. Nebergall to remain in contact with cancer genetics annually so that we can continuously update the family history and inform her of any changes in cancer genetics and testing that may be of benefit for this family.   Ms.  Ryle questions were answered to her satisfaction today. Our contact information was provided should additional questions or concerns arise. Thank you for the referral and allowing [name] to share in the care of your patient.   Tana Felts, MS Genetic Counselor [email or web address] phone: [phone]  The patient was seen for a total of 40 minutes in face-to-face genetic counseling.  The patient was accompanied today by her mother, Nunzio Cory

## 2017-11-27 NOTE — Progress Notes (Signed)
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 Soft tissue, biopsy, right medial thigh MATURE LIPOMA 3. Ovary and fallopian tube, right ENDOMETRIOMA AND SIMPLE SEROUS CYST WITH FALLOPIAN TUBE ADHESION NEGATIVE FOR MALIGNANCY 4. Omentum, resection for tumor BENIGN OMENTUM NEGATIVE FOR CARCINOMA 5. Lymph nodes, regional resection, right pelvic EIGHT BENIGN LYMPH NODES (0/8) 6. Lymph nodes, regional resection, left pelvic SEVEN BENIGN LYMPH NODES (0/7) 7. Lymph node, biopsy, right para-aortic FOUR BENIGN LYMPH NODES (0/4) 8. Lymph node, biopsy, left para-aortic ONE BENIGN LYMPH NODE (0/1) 9. Peritoneum, biopsy, left abdominal BENIGN FIBROMUSCULAR TISSUE 10. Peritoneum, biopsy, right abdominal BENIGN FIBROMUSCULAR TISSUE WITH SEROSITIS 11. Peritoneum, biopsy, pelvic FIBROADIPOSE TISSUE WITH SEROSITIS  Specimen(s): Ovary and fallopian tube Procedure: (including lymph node sampling): salpingo-oophorectomy Primary tumor site (including laterality): Left ovary Ovarian surface involvement: Yes Ovarian capsule intact without fragmentation: intact Maximum tumor size (cm): 20.0 cm Histologic type: Endometrioid carcinoma Grade: 1 Peritoneal implants: (specify invasive or non-invasive): Negative Pelvic extension (list additional structures on separate lines and  if involved): Negative Lymph nodes: number examined 20 ; number positive 0 TNM code: pT1c2, pN0, pMx FIGO Stage (based on pathologic findings, needs clinical correlation): IC2      09/11/2017 Surgery    Preoperative Diagnosis: 1. left adnexal mass.  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   Procedure(s) Performed: 1. Exploratory laparotomy with bilateral salpingo-oophorectomy, pelvic and para-aortic lymph node dissection, omentectomy and radical debulking for ovarian cancer (CPT [phone])  Surgeon: Everitt Amber, M.D. Operative Findings:20 cm left ovarian mass.   No intraperitoneal rupture occurred;  Frozen pathology was consistent with adenocarcinoma; right tube and ovary normal in appearance; 3) normal bilateral pelvic and para-aortic lymph nodes and omentum; small and large bowel to palpation. This represented an optimal cytoreduction with no gross visible disease remaining.       10/09/2017 Tumor Marker    Patient's tumor was tested for the following markers: CA-125 Results of the tumor marker test revealed 68.7      10/15/2017 Adverse Reaction    She developed reaction to Paclitaxel.      10/15/2017 -  Chemotherapy    She received carboplatin. Due to infusion reaction to Taxol with cycle 1, treatment was switched to carboplatin and Abraxane      11/03/2017 Procedure    Successful placement of a right internal jugular approach power injectable Port-A-Cath. The catheter is ready for immediate use.       INTERVAL HISTORY: Please see below for problem oriented charting. She returns for further follow-up She is doing very well with last cycle of treatment She has some mild alopecia and constipation for 3 days after treatment No nausea or mucositis Denies peripheral neuropathy The sensation of paresthesia has improved.  REVIEW OF SYSTEMS:   Constitutional: Denies fevers, chills or abnormal weight loss Eyes: Denies  blurriness of  vision Ears, nose, mouth, throat, and face: Denies mucositis or sore throat Respiratory: Denies cough, dyspnea or wheezes Cardiovascular: Denies palpitation, chest discomfort or lower extremity swelling Skin: Denies abnormal skin rashes Lymphatics: Denies new lymphadenopathy or easy bruising Neurological:Denies numbness, tingling or new weaknesses Behavioral/Psych: Mood is stable, no new changes  All other systems were reviewed with the patient and are negative.  I have reviewed the past medical history, past surgical history, social history and family history with the patient and they are unchanged from previous note.  ALLERGIES:  is allergic to bee venom; chlorhexidine; paclitaxel; other; bacitracin; and iodine.  MEDICATIONS:  Current Outpatient Medications  Medication Sig Dispense Refill  . acetaminophen (TYLENOL) 325 MG tablet Take 650 mg 2 (two) times daily as needed by mouth for headache.    Marland Kitchen amLODipine (NORVASC) 5 MG tablet Take 5 mg by mouth every evening.    . Ascorbic Acid (VITAMIN C) 1000 MG tablet Take 1,000 mg by mouth every morning.     . lidocaine-prilocaine (EMLA) cream Apply 1 application topically as needed. 30 g 6  . Liniments (SALONPAS PAIN RELIEF PATCH EX) Place 1 patch onto the skin daily as needed (knee pain).    Marland Kitchen loratadine (CLARITIN) 10 MG tablet Take 10 mg by mouth every morning.     . Melatonin 1 MG CAPS Take 1 mg by mouth at bedtime.    . Menthol, Topical Analgesic, 4 % GEL Apply 1 application daily as needed topically (headache).    . Methylsulfonylmethane (MSM) 1000 MG CAPS Take 1,000 mg by mouth every morning.     . Multiple Vitamins-Minerals (HAIR/SKIN/NAILS) TABS Take 2 tablets by mouth every morning.    . ondansetron (ZOFRAN) 8 MG tablet Take 1 tablet (8 mg total) by mouth every 8 (eight) hours as needed for refractory nausea / vomiting. Start on day 3 after chemo. 60 tablet 1  . prochlorperazine (COMPAZINE) 10 MG tablet Take 1 tablet (10 mg total) by  mouth every 6 (six) hours as needed (Nausea or vomiting). (Patient not taking: Reported on 10/22/2017) 30 tablet 1  . UNABLE TO FIND CPAP     No current facility-administered medications for this visit.     PHYSICAL EXAMINATION: ECOG PERFORMANCE STATUS: 1 - Symptomatic but completely ambulatory  Vitals:   11/27/17 0847  BP: 131/67  Pulse: 88  Resp: 18  Temp: 99 F (37.2 C)  SpO2: 100%   Filed Weights   11/27/17 0847  Weight: 173 lb 4.8 oz (78.6 kg)    GENERAL:alert, no distress and comfortable SKIN: skin color, texture, turgor are normal, no rashes or significant lesions EYES: normal, Conjunctiva are pink and non-injected, sclera clear OROPHARYNX:no exudate, no erythema and lips, buccal mucosa, and tongue normal  NECK: supple, thyroid normal size, non-tender, without nodularity LYMPH:  no palpable lymphadenopathy in the cervical, axillary or inguinal LUNGS: clear to auscultation and percussion with normal breathing effort HEART: regular rate & rhythm and no murmurs and no lower extremity edema ABDOMEN:abdomen soft, non-tender and normal bowel sounds Musculoskeletal:no cyanosis of digits and no clubbing  NEURO: alert & oriented x 3 with fluent speech, no focal motor/sensory deficits  LABORATORY DATA:  I have reviewed the data as listed    Component Value Date/Time   NA 141 11/27/2017 0818   K 3.8 11/27/2017 0818   CL 100 (L) 10/22/2017 1411   CO2 25 11/27/2017 0818   GLUCOSE 90 11/27/2017 0818   BUN 12.9 11/27/2017 0818  CREATININE 0.8 11/27/2017 0818   CALCIUM 9.9 11/27/2017 0818   PROT 7.0 11/27/2017 0818   ALBUMIN 3.8 11/27/2017 0818   AST 11 11/27/2017 0818   ALT 11 11/27/2017 0818   ALKPHOS 68 11/27/2017 0818   BILITOT 0.41 11/27/2017 0818   GFRNONAA >60 10/22/2017 1411   GFRAA >60 10/22/2017 1411    No results found for: SPEP, UPEP  Lab Results  Component Value Date   WBC 4.0 11/27/2017   NEUTROABS 2.2 11/27/2017   HGB 10.1 (L) 11/27/2017   HCT  30.2 (L) 11/27/2017   MCV 83.2 11/27/2017   PLT 294 11/27/2017      Chemistry      Component Value Date/Time   NA 141 11/27/2017 0818   K 3.8 11/27/2017 0818   CL 100 (L) 10/22/2017 1411   CO2 25 11/27/2017 0818   BUN 12.9 11/27/2017 0818   CREATININE 0.8 11/27/2017 0818      Component Value Date/Time   CALCIUM 9.9 11/27/2017 0818   ALKPHOS 68 11/27/2017 0818   AST 11 11/27/2017 0818   ALT 11 11/27/2017 0818   BILITOT 0.41 11/27/2017 0818       RADIOGRAPHIC STUDIES: I have personally reviewed the radiological images as listed and agreed with the findings in the report. Ir US Guide Vasc Access Right  Result Date: 11/03/2017 INDICATION: History of ovarian cancer. In need of durable intravenous access for chemotherapy administration. EXAM: IMPLANTED PORT A CATH PLACEMENT WITH ULTRASOUND AND FLUOROSCOPIC GUIDANCE COMPARISON:  None. MEDICATIONS: Ancef 2 gm IV; The antibiotic was administered within an appropriate time interval prior to skin puncture. ANESTHESIA/SEDATION: Moderate (conscious) sedation was employed during this procedure. A total of Versed 4 mg and Fentanyl 100 mcg was administered intravenously. Moderate Sedation Time: 23 minutes. The patient's level of consciousness and vital signs were monitored continuously by radiology nursing throughout the procedure under my direct supervision. CONTRAST:  None FLUOROSCOPY TIME:  30 seconds (8 mGy) COMPLICATIONS: None immediate. PROCEDURE: The procedure, risks, benefits, and alternatives were explained to the patient. Questions regarding the procedure were encouraged and answered. The patient understands and consents to the procedure. The right neck and chest were prepped with chlorhexidine in a sterile fashion, and a sterile drape was applied covering the operative field. Maximum barrier sterile technique with sterile gowns and gloves were used for the procedure. A timeout was performed prior to the initiation of the procedure. Local  anesthesia was provided with 1% lidocaine with epinephrine. After creating a small venotomy incision, a micropuncture kit was utilized to access the internal jugular vein. Real-time ultrasound guidance was utilized for vascular access including the acquisition of a permanent ultrasound image documenting patency of the accessed vessel. The microwire was utilized to measure appropriate catheter length. A subcutaneous port pocket was then created along the upper chest wall utilizing a combination of sharp and blunt dissection. The pocket was irrigated with sterile saline. A single lumen thin power injectable port was chosen for placement. The 8 Fr catheter was tunneled from the port pocket site to the venotomy incision. The port was placed in the pocket. The external catheter was trimmed to appropriate length. At the venotomy, an 8 Fr peel-away sheath was placed over a guidewire under fluoroscopic guidance. The catheter was then placed through the sheath and the sheath was removed. Final catheter positioning was confirmed and documented with a fluoroscopic spot radiograph. The port was accessed with a Huber needle, aspirated and flushed with heparinized saline. The venotomy site was closed  with an interrupted 4-0 Vicryl suture. The port pocket incision was closed with interrupted 2-0 Vicryl suture and the skin was opposed with a running subcuticular 4-0 Vicryl suture. Dermabond and Steri-strips were applied to both incisions. Dressings were placed. The patient tolerated the procedure well without immediate post procedural complication. FINDINGS: After catheter placement, the tip lies within the superior cavoatrial junction. The catheter aspirates and flushes normally and is ready for immediate use. IMPRESSION: Successful placement of a right internal jugular approach power injectable Port-A-Cath. The catheter is ready for immediate use. Electronically Signed   By: Sandi Mariscal M.D.   On: 11/03/2017 16:11   Ir Fluoro  Guide Port Insertion Right  Result Date: 11/03/2017 INDICATION: History of ovarian cancer. In need of durable intravenous access for chemotherapy administration. EXAM: IMPLANTED PORT A CATH PLACEMENT WITH ULTRASOUND AND FLUOROSCOPIC GUIDANCE COMPARISON:  None. MEDICATIONS: Ancef 2 gm IV; The antibiotic was administered within an appropriate time interval prior to skin puncture. ANESTHESIA/SEDATION: Moderate (conscious) sedation was employed during this procedure. A total of Versed 4 mg and Fentanyl 100 mcg was administered intravenously. Moderate Sedation Time: 23 minutes. The patient's level of consciousness and vital signs were monitored continuously by radiology nursing throughout the procedure under my direct supervision. CONTRAST:  None FLUOROSCOPY TIME:  30 seconds (8 mGy) COMPLICATIONS: None immediate. PROCEDURE: The procedure, risks, benefits, and alternatives were explained to the patient. Questions regarding the procedure were encouraged and answered. The patient understands and consents to the procedure. The right neck and chest were prepped with chlorhexidine in a sterile fashion, and a sterile drape was applied covering the operative field. Maximum barrier sterile technique with sterile gowns and gloves were used for the procedure. A timeout was performed prior to the initiation of the procedure. Local anesthesia was provided with 1% lidocaine with epinephrine. After creating a small venotomy incision, a micropuncture kit was utilized to access the internal jugular vein. Real-time ultrasound guidance was utilized for vascular access including the acquisition of a permanent ultrasound image documenting patency of the accessed vessel. The microwire was utilized to measure appropriate catheter length. A subcutaneous port pocket was then created along the upper chest wall utilizing a combination of sharp and blunt dissection. The pocket was irrigated with sterile saline. A single lumen thin power  injectable port was chosen for placement. The 8 Fr catheter was tunneled from the port pocket site to the venotomy incision. The port was placed in the pocket. The external catheter was trimmed to appropriate length. At the venotomy, an 8 Fr peel-away sheath was placed over a guidewire under fluoroscopic guidance. The catheter was then placed through the sheath and the sheath was removed. Final catheter positioning was confirmed and documented with a fluoroscopic spot radiograph. The port was accessed with a Huber needle, aspirated and flushed with heparinized saline. The venotomy site was closed with an interrupted 4-0 Vicryl suture. The port pocket incision was closed with interrupted 2-0 Vicryl suture and the skin was opposed with a running subcuticular 4-0 Vicryl suture. Dermabond and Steri-strips were applied to both incisions. Dressings were placed. The patient tolerated the procedure well without immediate post procedural complication. FINDINGS: After catheter placement, the tip lies within the superior cavoatrial junction. The catheter aspirates and flushes normally and is ready for immediate use. IMPRESSION: Successful placement of a right internal jugular approach power injectable Port-A-Cath. The catheter is ready for immediate use. Electronically Signed   By: Sandi Mariscal M.D.   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  Observe only  Other constipation We discussed laxative management.   No orders of the defined types were placed in this encounter.  All questions  were answered. The patient knows to call the clinic with any problems, questions or concerns. No barriers to learning was detected. I spent 15 minutes counseling the patient face to face. The total time spent in the appointment was 20 minutes and more than 50% was on counseling and review of test results     Heath Lark, MD 11/27/2017 9:25 AM

## 2017-11-27 NOTE — Patient Instructions (Signed)
Implanted Port Home Guide An implanted port is a type of central line that is placed under the skin. Central lines are used to provide IV access when treatment or nutrition needs to be given through a person's veins. Implanted ports are used for long-term IV access. An implanted port may be placed because:  You need IV medicine that would be irritating to the small veins in your hands or arms.  You need long-term IV medicines, such as antibiotics.  You need IV nutrition for a long period.  You need frequent blood draws for lab tests.  You need dialysis.  Implanted ports are usually placed in the chest area, but they can also be placed in the upper arm, the abdomen, or the leg. An implanted port has two main parts:  Reservoir. The reservoir is round and will appear as a small, raised area under your skin. The reservoir is the part where a needle is inserted to give medicines or draw blood.  Catheter. The catheter is a thin, flexible tube that extends from the reservoir. The catheter is placed into a large vein. Medicine that is inserted into the reservoir goes into the catheter and then into the vein.  How will I care for my incision site? Do not get the incision site wet. Bathe or shower as directed by your health care provider. How is my port accessed? Special steps must be taken to access the port:  Before the port is accessed, a numbing cream can be placed on the skin. This helps numb the skin over the port site.  Your health care provider uses a sterile technique to access the port. ? Your health care provider must put on a mask and sterile gloves. ? The skin over your port is cleaned carefully with an antiseptic and allowed to dry. ? The port is gently pinched between sterile gloves, and a needle is inserted into the port.  Only "non-coring" port needles should be used to access the port. Once the port is accessed, a blood return should be checked. This helps ensure that the port  is in the vein and is not clogged.  If your port needs to remain accessed for a constant infusion, a clear (transparent) bandage will be placed over the needle site. The bandage and needle will need to be changed every week, or as directed by your health care provider.  Keep the bandage covering the needle clean and dry. Do not get it wet. Follow your health care provider's instructions on how to take a shower or bath while the port is accessed.  If your port does not need to stay accessed, no bandage is needed over the port.  What is flushing? Flushing helps keep the port from getting clogged. Follow your health care provider's instructions on how and when to flush the port. Ports are usually flushed with saline solution or a medicine called heparin. The need for flushing will depend on how the port is used.  If the port is used for intermittent medicines or blood draws, the port will need to be flushed: ? After medicines have been given. ? After blood has been drawn. ? As part of routine maintenance.  If a constant infusion is running, the port may not need to be flushed.  How long will my port stay implanted? The port can stay in for as long as your health care provider thinks it is needed. When it is time for the port to come out, surgery will be   done to remove it. The procedure is similar to the one performed when the port was put in. When should I seek immediate medical care? When you have an implanted port, you should seek immediate medical care if:  You notice a bad smell coming from the incision site.  You have swelling, redness, or drainage at the incision site.  You have more swelling or pain at the port site or the surrounding area.  You have a fever that is not controlled with medicine.  This information is not intended to replace advice given to you by your health care provider. Make sure you discuss any questions you have with your health care provider. Document  Released: 11/25/2005 Document Revised: 05/02/2016 Document Reviewed: 08/02/2013 Elsevier Interactive Patient Education  2017 [employer]  

## 2017-11-28 ENCOUNTER — Telehealth: Payer: Self-pay | Admitting: *Deleted

## 2017-11-28 ENCOUNTER — Telehealth: Payer: Self-pay | Admitting: Hematology and Oncology

## 2017-11-28 LAB — CA 125: Cancer Antigen (CA) 125: 27.7 U/mL (ref 0.0–38.1)

## 2017-11-28 NOTE — Telephone Encounter (Signed)
Per 12/20 los - Return for No new orders or return visit.

## 2017-11-28 NOTE — Telephone Encounter (Signed)
Per request from [email or web address] faxed the records to [phone]

## 2017-12-15 ENCOUNTER — Ambulatory Visit
Admission: RE | Admit: 2017-12-15 | Discharge: 2017-12-15 | Disposition: A | Discharge status: Home / Self Care | Payer: BLUE CROSS/BLUE SHIELD | Source: Ambulatory Visit | Attending: Internal Medicine | Admitting: Internal Medicine

## 2017-12-15 DIAGNOSIS — Z139 Encounter for screening, unspecified: Secondary | ICD-10-CM

## 2017-12-17 ENCOUNTER — Telehealth: Payer: Self-pay | Admitting: Genetics

## 2017-12-17 NOTE — Telephone Encounter (Signed)
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  We discussed the recommendations for increased breast screening (mammogram and MRI) as well as the option to pursue pancreatic cancer screening (EUS and MRCP).  We discussed that other family members are at risk to have this mutation and be at increased risk for cancer as well, and we recommend all relatives on both sides of the family have genetic testing for this mutation until we can determine which side of the family this came from.  An association between ATM and ovarian and prostate cancer has been suggested, but there is limited evidence about these associations.  Therefore we do not know if her ovarian cancer caused by this mutation or if not, but it does provide [name] information about future cancer risks for her.   We discussed tentatively meeting for a follow-up appointment tomorrow 1/10 depending on what time her infusion ends.  She will call when she is done to see if I am able to meet with her at the time her infusion is complete.  If we are not able to meet tomorrow we discussed another appointment in the future could be made.   She expressed understanding of this information and expressed that she is disappointed that her result is positive.   However, she is ready to start discussing screening options for future cancer risks.

## 2017-12-18 ENCOUNTER — Inpatient Hospital Stay: Payer: BLUE CROSS/BLUE SHIELD

## 2017-12-18 ENCOUNTER — Inpatient Hospital Stay: Payer: BLUE CROSS/BLUE SHIELD | Attending: Hematology and Oncology | Admitting: Hematology and Oncology

## 2017-12-18 ENCOUNTER — Encounter: Payer: Self-pay | Admitting: Hematology and Oncology

## 2017-12-18 ENCOUNTER — Ambulatory Visit: Payer: BLUE CROSS/BLUE SHIELD

## 2017-12-18 ENCOUNTER — Telehealth: Payer: Self-pay | Admitting: Hematology and Oncology

## 2017-12-18 DIAGNOSIS — D61818 Other pancytopenia: Secondary | ICD-10-CM | POA: Diagnosis not present

## 2017-12-18 DIAGNOSIS — K5900 Constipation, unspecified: Secondary | ICD-10-CM | POA: Diagnosis not present

## 2017-12-18 DIAGNOSIS — C562 Malignant neoplasm of left ovary: Secondary | ICD-10-CM

## 2017-12-18 DIAGNOSIS — Z79899 Other long term (current) drug therapy: Secondary | ICD-10-CM

## 2017-12-18 DIAGNOSIS — L659 Nonscarring hair loss, unspecified: Secondary | ICD-10-CM

## 2017-12-18 DIAGNOSIS — D6481 Anemia due to antineoplastic chemotherapy: Secondary | ICD-10-CM

## 2017-12-18 DIAGNOSIS — Z5111 Encounter for antineoplastic chemotherapy: Secondary | ICD-10-CM | POA: Insufficient documentation

## 2017-12-18 DIAGNOSIS — Z1501 Genetic susceptibility to malignant neoplasm of breast: Secondary | ICD-10-CM | POA: Insufficient documentation

## 2017-12-18 DIAGNOSIS — T451X5A Adverse effect of antineoplastic and immunosuppressive drugs, initial encounter: Secondary | ICD-10-CM

## 2017-12-18 LAB — CBC WITH DIFFERENTIAL/PLATELET
BASOS ABS: 0 10*3/uL (ref 0.0–0.1)
BASOS PCT: 0 %
EOS PCT: 2 %
Eosinophils Absolute: 0.1 10*3/uL (ref 0.0–0.5)
HCT: 31.6 % — ABNORMAL LOW (ref 34.8–46.6)
Hemoglobin: 10.6 g/dL — ABNORMAL LOW (ref 11.6–15.9)
LYMPHS PCT: 27 %
Lymphs Abs: 1.2 10*3/uL (ref 0.9–3.3)
MCH: 29 pg (ref 25.1–34.0)
MCHC: 33.5 g/dL (ref 31.5–36.0)
MCV: 86.5 fL (ref 79.5–101.0)
Monocytes Absolute: 0.5 10*3/uL (ref 0.1–0.9)
Monocytes Relative: 10 %
Neutro Abs: 2.7 10*3/uL (ref 1.5–6.5)
Neutrophils Relative %: 61 %
PLATELETS: 215 10*3/uL (ref 145–400)
RBC: 3.65 MIL/uL — AB (ref 3.70–5.45)
RDW: 26.1 % — ABNORMAL HIGH (ref 11.2–16.1)
WBC: 4.5 10*3/uL (ref 3.9–10.3)

## 2017-12-18 LAB — COMPREHENSIVE METABOLIC PANEL
ALT: 18 U/L (ref 0–55)
AST: 13 U/L (ref 5–34)
Albumin: 3.9 g/dL (ref 3.5–5.0)
Alkaline Phosphatase: 62 U/L (ref 40–150)
Anion gap: 11 (ref 3–11)
BILIRUBIN TOTAL: 0.4 mg/dL (ref 0.2–1.2)
BUN: 12 mg/dL (ref 7–26)
CO2: 24 mmol/L (ref 22–29)
Calcium: 10 mg/dL (ref 8.4–10.4)
Chloride: 106 mmol/L (ref 98–109)
Creatinine, Ser: 0.8 mg/dL (ref 0.60–1.10)
GFR calc Af Amer: >60 mL/min (ref >60)
GFR calc non Af Amer: >60 mL/min (ref >60)
Glucose, Bld: 101 mg/dL (ref 70–140)
POTASSIUM: 3.9 mmol/L (ref 3.3–4.7)
Sodium: 141 mmol/L (ref 136–145)
TOTAL PROTEIN: 7 g/dL (ref 6.4–8.3)

## 2017-12-18 MED ORDER — SODIUM CHLORIDE 0.9 % IV SOLN
680.0000 mg | Freq: Once | INTRAVENOUS | Status: AC
  Administered 2017-12-18: 680 mg via INTRAVENOUS
  Filled 2017-12-18: qty 68

## 2017-12-18 MED ORDER — FOSAPREPITANT DIMEGLUMINE INJECTION 150 MG
Freq: Once | INTRAVENOUS | Status: AC
  Administered 2017-12-18: 10:00:00 via INTRAVENOUS
  Filled 2017-12-18: qty 5

## 2017-12-18 MED ORDER — HEPARIN SOD (PORK) LOCK FLUSH 100 UNIT/ML IV SOLN
500.0000 [IU] | Freq: Once | INTRAVENOUS | Status: AC | PRN
  Administered 2017-12-18: 500 [IU]
  Filled 2017-12-18: qty 5

## 2017-12-18 MED ORDER — PALONOSETRON HCL INJECTION 0.25 MG/5ML
INTRAVENOUS | Status: AC
Start: 2017-12-18 — End: 2017-12-18
  Filled 2017-12-18: qty 5

## 2017-12-18 MED ORDER — PACLITAXEL PROTEIN-BOUND CHEMO INJECTION 100 MG
125.0000 mg/m2 | Freq: Once | INTRAVENOUS | Status: AC
Start: 2017-12-18 — End: 2017-12-18
  Administered 2017-12-18: 250 mg via INTRAVENOUS
  Filled 2017-12-18: qty 50

## 2017-12-18 MED ORDER — SODIUM CHLORIDE 0.9% FLUSH
10.0000 mL | INTRAVENOUS | Status: DC | PRN
  Administered 2017-12-18: 10 mL via INTRAVENOUS
  Filled 2017-12-18: qty 10

## 2017-12-18 MED ORDER — SODIUM CHLORIDE 0.9 % IV SOLN
Freq: Once | INTRAVENOUS | Status: AC
  Administered 2017-12-18: 10:00:00 via INTRAVENOUS

## 2017-12-18 MED ORDER — PALONOSETRON HCL INJECTION 0.25 MG/5ML
0.2500 mg | Freq: Once | INTRAVENOUS | Status: AC
  Administered 2017-12-18: 0.25 mg via INTRAVENOUS

## 2017-12-18 MED ORDER — SODIUM CHLORIDE 0.9% FLUSH
10.0000 mL | INTRAVENOUS | Status: DC | PRN
  Administered 2017-12-18: 10 mL
  Filled 2017-12-18: qty 10

## 2017-12-18 NOTE — Patient Instructions (Signed)
[facility] Discharge Instructions for Patients Receiving Chemotherapy  Today you received the following chemotherapy agents Carboplatin and Abraxane.   To help prevent nausea and vomiting after your treatment, we encourage you to take your nausea medication as directed.   If you develop nausea and vomiting that is not controlled by your nausea medication, call the clinic.   BELOW ARE SYMPTOMS THAT SHOULD BE REPORTED IMMEDIATELY:  *FEVER GREATER THAN 100.5 F  *CHILLS WITH OR WITHOUT FEVER  NAUSEA AND VOMITING THAT IS NOT CONTROLLED WITH YOUR NAUSEA MEDICATION  *UNUSUAL SHORTNESS OF BREATH  *UNUSUAL BRUISING OR BLEEDING  TENDERNESS IN MOUTH AND THROAT WITH OR WITHOUT PRESENCE OF ULCERS  *URINARY PROBLEMS  *BOWEL PROBLEMS  UNUSUAL RASH Items with * indicate a potential emergency and should be followed up as soon as possible.  Feel free to call the clinic should you have any questions or concerns. The clinic phone number is [phone].  Please show the CHEMO ALERT CARD at check-in to the Emergency Department and triage nurse.   

## 2017-12-18 NOTE — Telephone Encounter (Signed)
Gave patient avs and calendar with appts per 1/10 los °

## 2017-12-18 NOTE — Assessment & Plan Note (Signed)
Genetic testing review ATM mutation It is consider a variant of unknown significance in terms of risk of recurrence of cancer from ovarian standpoint but predispose her to risk of pancreatic cancer and breast cancer I discussed briefly with my colleagues I reviewed the guidelines with the patient She is interested to have increased screening modality and consideration for tamoxifen after completion of chemotherapy I would discuss this further with her in her next visit

## 2017-12-18 NOTE — Progress Notes (Signed)
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 Soft tissue, biopsy, right medial thigh MATURE LIPOMA 3. Ovary and fallopian tube, right ENDOMETRIOMA AND SIMPLE SEROUS CYST WITH FALLOPIAN TUBE ADHESION NEGATIVE FOR MALIGNANCY 4. Omentum, resection for tumor BENIGN OMENTUM NEGATIVE FOR CARCINOMA 5. Lymph nodes, regional resection, right pelvic EIGHT BENIGN LYMPH NODES (0/8) 6. Lymph nodes, regional resection, left pelvic SEVEN BENIGN LYMPH NODES (0/7) 7. Lymph node, biopsy, right para-aortic FOUR BENIGN LYMPH NODES (0/4) 8. Lymph node, biopsy, left para-aortic ONE BENIGN LYMPH NODE (0/1) 9. Peritoneum, biopsy, left abdominal BENIGN FIBROMUSCULAR TISSUE 10. Peritoneum, biopsy, right abdominal BENIGN FIBROMUSCULAR TISSUE WITH SEROSITIS 11. Peritoneum, biopsy, pelvic FIBROADIPOSE TISSUE WITH SEROSITIS  Specimen(s): Ovary and fallopian tube Procedure: (including lymph node sampling): salpingo-oophorectomy Primary tumor site (including laterality): Left ovary Ovarian surface involvement: Yes Ovarian capsule intact without fragmentation: intact Maximum tumor size (cm): 20.0 cm Histologic type: Endometrioid carcinoma Grade: 1 Peritoneal implants: (specify invasive or non-invasive):  Negative Pelvic extension (list additional structures on separate lines and if involved): Negative Lymph nodes: number examined 20 ; number positive 0 TNM code: pT1c2, pN0, pMx FIGO Stage (based on pathologic findings, needs clinical correlation): IC2      09/11/2017 Surgery    Preoperative Diagnosis: 1. left adnexal mass.  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   Procedure(s) Performed: 1. Exploratory laparotomy with bilateral salpingo-oophorectomy, pelvic and para-aortic lymph node dissection, omentectomy and radical debulking for ovarian cancer (CPT [phone])  Surgeon: Everitt Amber, M.D. Operative Findings:20 cm left ovarian mass.   No intraperitoneal rupture occurred;  Frozen pathology was consistent with adenocarcinoma; right tube and ovary normal in appearance; 3) normal bilateral pelvic and para-aortic lymph nodes and omentum; small and large bowel to palpation. This represented an optimal cytoreduction with no gross visible disease remaining.       10/09/2017 Tumor Marker    Patient's tumor was tested for the following markers: CA-125 Results of the tumor marker test revealed 68.7      10/15/2017 Adverse Reaction    She developed reaction to Paclitaxel.      10/15/2017 -  Chemotherapy    She received carboplatin. Due to infusion reaction to Taxol with cycle 1, treatment was switched to carboplatin and Abraxane      11/03/2017 Procedure    Successful placement of a right internal jugular approach power injectable Port-A-Cath. The catheter is ready for immediate use.      11/27/2017 Tumor Marker    Patient's tumor was tested for the following markers: CA-125 Results of the tumor marker test revealed 27.7       INTERVAL HISTORY: Please see below for problem oriented charting.. She returns to be seen prior to chemotherapy She had numerous questions related to  positive genetic testing She had recent screening mammogram that  came back unremarkable although she have dense breast bilaterally She has family history of pancreatic cancer and breast cancer Overall, from the chemotherapy standpoint, she tolerated treatment well She denies significant nausea vomiting Denies peripheral neuropathy No recent infection  REVIEW OF SYSTEMS:   Constitutional: Denies fevers, chills or abnormal weight loss Eyes: Denies blurriness of vision Ears, nose, mouth, throat, and face: Denies mucositis or sore throat Respiratory: Denies cough, dyspnea or wheezes Cardiovascular: Denies palpitation, chest discomfort or lower extremity swelling Gastrointestinal:  Denies nausea, heartburn or change in bowel habits Skin: Denies abnormal skin rashes Lymphatics: Denies new lymphadenopathy or easy bruising Neurological:Denies numbness, tingling or new weaknesses Behavioral/Psych: Mood is stable, no new changes  All other systems were reviewed with the patient and are negative.  I have reviewed the past medical history, past surgical history, social history and family history with the patient and they are unchanged from previous note.  ALLERGIES:  is allergic to bee venom; chlorhexidine; paclitaxel; other; bacitracin; and iodine.  MEDICATIONS:  Current Outpatient Medications  Medication Sig Dispense Refill  . acetaminophen (TYLENOL) 325 MG tablet Take 650 mg 2 (two) times daily as needed by mouth for headache.    Marland Kitchen amLODipine (NORVASC) 5 MG tablet Take 5 mg by mouth every evening.    . Ascorbic Acid (VITAMIN C) 1000 MG tablet Take 1,000 mg by mouth every morning.     . lidocaine-prilocaine (EMLA) cream Apply 1 application topically as needed. 30 g 6  . Liniments (SALONPAS PAIN RELIEF PATCH EX) Place 1 patch onto the skin daily as needed (knee pain).    Marland Kitchen loratadine (CLARITIN) 10 MG tablet Take 10 mg by mouth every morning.     . Melatonin 1 MG CAPS Take 1 mg by mouth at bedtime.    . Menthol, Topical Analgesic, 4 % GEL Apply 1 application  daily as needed topically (headache).    . Methylsulfonylmethane (MSM) 1000 MG CAPS Take 1,000 mg by mouth every morning.     . Multiple Vitamins-Minerals (HAIR/SKIN/NAILS) TABS Take 2 tablets by mouth every morning.    . ondansetron (ZOFRAN) 8 MG tablet Take 1 tablet (8 mg total) by mouth every 8 (eight) hours as needed for refractory nausea / vomiting. Start on day 3 after chemo. 60 tablet 1  . prochlorperazine (COMPAZINE) 10 MG tablet Take 1 tablet (10 mg total) by mouth every 6 (six) hours as needed (Nausea or vomiting). (Patient not taking: Reported on 10/22/2017) 30 tablet 1  . UNABLE TO FIND CPAP     No current facility-administered medications for this visit.    Facility-Administered Medications Ordered in Other Visits  Medication Dose Route Frequency Provider Last Rate Last Dose  . CARBOplatin (PARAPLATIN) 680 mg in sodium chloride 0.9 % 250 mL chemo infusion  680 mg Intravenous Once Alvy Bimler, Ni, MD      . heparin lock flush 100 unit/mL  500 Units Intracatheter Once PRN Alvy Bimler, Ni, MD      . sodium chloride flush (NS) 0.9 % injection 10 mL  10 mL Intracatheter PRN Alvy Bimler, Ni, MD        PHYSICAL EXAMINATION: ECOG PERFORMANCE STATUS: 1 - Symptomatic but completely ambulatory  Vitals:   12/18/17 0851  BP: (!) 147/84  Pulse: 87  Resp: 18  Temp: 98.2 F (36.8 C)  SpO2: 100%   Filed Weights   12/18/17 0851  Weight: 178 lb (80.7 kg)    GENERAL:alert, no distress  and comfortable SKIN: skin color, texture, turgor are normal, no rashes or significant lesions EYES: normal, Conjunctiva are pink and non-injected, sclera clear OROPHARYNX:no exudate, no erythema and lips, buccal mucosa, and tongue normal  NECK: supple, thyroid normal size, non-tender, without nodularity LYMPH:  no palpable lymphadenopathy in the cervical, axillary or inguinal LUNGS: clear to auscultation and percussion with normal breathing effort HEART: regular rate & rhythm and no murmurs and no lower extremity  edema ABDOMEN:abdomen soft, non-tender and normal bowel sounds Musculoskeletal:no cyanosis of digits and no clubbing  NEURO: alert & oriented x 3 with fluent speech, no focal motor/sensory deficits  LABORATORY DATA:  I have reviewed the data as listed    Component Value Date/Time   NA 141 12/18/2017 0810   NA 141 11/27/2017 0818   K 3.9 12/18/2017 0810   K 3.8 11/27/2017 0818   CL 106 12/18/2017 0810   CO2 24 12/18/2017 0810   CO2 25 11/27/2017 0818   GLUCOSE 101 12/18/2017 0810   GLUCOSE 90 11/27/2017 0818   BUN 12 12/18/2017 0810   BUN 12.9 11/27/2017 0818   CREATININE 0.80 12/18/2017 0810   CREATININE 0.8 11/27/2017 0818   CALCIUM 10.0 12/18/2017 0810   CALCIUM 9.9 11/27/2017 0818   PROT 7.0 12/18/2017 0810   PROT 7.0 11/27/2017 0818   ALBUMIN 3.9 12/18/2017 0810   ALBUMIN 3.8 11/27/2017 0818   AST 13 12/18/2017 0810   AST 11 11/27/2017 0818   ALT 18 12/18/2017 0810   ALT 11 11/27/2017 0818   ALKPHOS 62 12/18/2017 0810   ALKPHOS 68 11/27/2017 0818   BILITOT 0.4 12/18/2017 0810   BILITOT 0.41 11/27/2017 0818   GFRNONAA >60 12/18/2017 0810   GFRAA >60 12/18/2017 0810    No results found for: SPEP, UPEP  Lab Results  Component Value Date   WBC 4.5 12/18/2017   NEUTROABS 2.7 12/18/2017   HGB 10.6 (L) 12/18/2017   HCT 31.6 (L) 12/18/2017   MCV 86.5 12/18/2017   PLT 215 12/18/2017      Chemistry      Component Value Date/Time   NA 141 12/18/2017 0810   NA 141 11/27/2017 0818   K 3.9 12/18/2017 0810   K 3.8 11/27/2017 0818   CL 106 12/18/2017 0810   CO2 24 12/18/2017 0810   CO2 25 11/27/2017 0818   BUN 12 12/18/2017 0810   BUN 12.9 11/27/2017 0818   CREATININE 0.80 12/18/2017 0810   CREATININE 0.8 11/27/2017 0818      Component Value Date/Time   CALCIUM 10.0 12/18/2017 0810   CALCIUM 9.9 11/27/2017 0818   ALKPHOS 62 12/18/2017 0810   ALKPHOS 68 11/27/2017 0818   AST 13 12/18/2017 0810   AST 11 11/27/2017 0818   ALT 18 12/18/2017 0810   ALT 11  11/27/2017 0818   BILITOT 0.4 12/18/2017 0810   BILITOT 0.41 11/27/2017 0818       RADIOGRAPHIC STUDIES: I have personally reviewed the radiological images as listed and agreed with the findings in the report. Mm Screening Breast Tomo Bilateral  Result Date: 12/15/2017 CLINICAL DATA:  Screening. EXAM: 2D DIGITAL SCREENING BILATERAL MAMMOGRAM WITH 3D TOMO WITH CAD COMPARISON:  Previous exam(s). ACR Breast Density Category c: The breast tissue is heterogeneously dense, which may obscure small masses. FINDINGS: There are no findings suspicious for malignancy. Images were processed with CAD. IMPRESSION: No mammographic evidence of malignancy. A result letter of this screening mammogram will be mailed directly to the patient. RECOMMENDATION: Screening mammogram in  one year. (Code:SM-B-01Y) BI-RADS CATEGORY  1: Negative. Electronically Signed   By: Marin Olp M.D.   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 The patient denies recent history of bleeding such as epistaxis, hematuria or hematochezia. She is asymptomatic from the  anemia. I will observe for now.  She does not require transfusion now. I will continue the chemotherapy at current dose without dosage adjustment.  If the anemia gets progressive worse in the future, I might have to delay her treatment or adjust the chemotherapy dose.    No orders of the defined types were placed in this encounter.  All questions were answered. The patient knows to call the clinic with any problems, questions or concerns. No barriers to learning was detected. I spent 25 minutes counseling the patient face to face. The total time spent in the appointment was 30 minutes and more than 50% was on counseling and review of test results     Heath Lark, MD 12/18/2017 11:49 AM

## 2017-12-18 NOTE — Assessment & Plan Note (Signed)

## 2017-12-18 NOTE — Assessment & Plan Note (Signed)
She tolerated recent chemotherapy well except for mild constipation She started to develop alopecia She denies peripheral neuropathy We would proceed with treatment today without dose adjustment

## 2017-12-19 ENCOUNTER — Telehealth: Payer: Self-pay | Admitting: Genetics

## 2017-12-19 ENCOUNTER — Ambulatory Visit: Payer: Self-pay | Admitting: Genetics

## 2017-12-19 DIAGNOSIS — Z8042 Family history of malignant neoplasm of prostate: Secondary | ICD-10-CM

## 2017-12-19 DIAGNOSIS — Z1379 Encounter for other screening for genetic and chromosomal anomalies: Secondary | ICD-10-CM

## 2017-12-19 DIAGNOSIS — Z1509 Genetic susceptibility to other malignant neoplasm: Secondary | ICD-10-CM

## 2017-12-19 DIAGNOSIS — Z1501 Genetic susceptibility to malignant neoplasm of breast: Secondary | ICD-10-CM

## 2017-12-19 DIAGNOSIS — Z1589 Genetic susceptibility to other disease: Secondary | ICD-10-CM | POA: Insufficient documentation

## 2017-12-19 DIAGNOSIS — C562 Malignant neoplasm of left ovary: Secondary | ICD-10-CM

## 2017-12-19 DIAGNOSIS — Z800 Family history of malignant neoplasm of digestive organs: Secondary | ICD-10-CM

## 2017-12-19 NOTE — Progress Notes (Addendum)
GENETIC TEST RESULTS   Patient Name: Sierra Gray Patient Age: 57 y.o. Encounter Date: 12/19/2017  Referring Provider: Heath Lark, MD  Sierra Gray was seen in the [place] [place] clinic on 11/27/2018 due to a personal and family history of cancer and concern regarding a hereditary predisposition to cancer in the family. Please refer to the prior Genetics clinic note for more information regarding Sierra Gray's medical and family histories and our assessment at the time.   FAMILY HISTORY:  We obtained a detailed, 4-generation family history.  Significant diagnoses are listed below: Family History  Problem Relation Age of Onset  . Hypertension Mother   . Hyperlipidemia Mother   . Pancreatic cancer Father 59       died 12 months after dx  . Hypertension Father   . Prostate cancer Father 30       had surgery for it  . Breast cancer Maternal Aunt        dx 35's- she had surgery, think it was cancer but could have been just a lump removed  . Heart disease Maternal Aunt   . Hyperlipidemia Maternal Uncle   . Diabetes Maternal Uncle   . Stroke Maternal Uncle   . Osteoporosis Maternal Uncle   . Heart attack Maternal Grandmother   . Diabetes Maternal Grandfather   . Dementia Maternal Grandfather   . Osteoporosis Paternal Grandmother   . Colon cancer Paternal Grandfather 37   Sierra Gray has no children.  She has a 56 year-old sister with no history of cancer.  This sister has 4 sons in their 26's and 66's with no history of cancer.    Sierra Gray father had prostate caner at 42, he had surgery to treat this.  He then developed pancreatic cancer 1 year later at 65 and died 4 months after the diagnosis.  Sierra Gray has a paternal uncle who is 58 with no history of cancer.  This uncle has 2 daughters and a son int heir 68's with no history of cancer.  Sierra Gray paternal grandfather was diagnosed with colon cancer at 64 and died at 63.  Sierra Gray paternal  grandmother did at 38 with no history of cancer.   Sierra Gray mother is 102 and has no history of cancer.  Her ovaries and uterus are intact.  Sierra Gray has 2 maternal uncles and 2 maternal autns.  One uncle died at 42 with no history of cancer and has 2 children without any history of cancer.  Her other maternal uncle is 15 with no history of cancer.  He has 3 children without any history of cancer.  One maternal aunt had surgery to remove a breast mass in her 84's.  They think it was breast cancer, but are not 100% sure- she reports it could have been some other type of lump (not cancer).  This aunt had 1 son who is 76 with no history of cancer. Sierra Gray other aunt is 52 with no history of cancer.  She had 2 sons and a daughter.  One of these sons (pateins' cousin) was diagnosed with liver cancer at 5 and is not 69.  He likely had a history of alcohol exposure.  Sierra Gray maternal grandparents died in their 86's with no history of cancer.   Sierra Gray is unaware of previous family history of genetic testing for hereditary cancer risks. Patient's maternal ancestors are of Northern European descent, and paternal ancestors are of Northern European descent. There is no  reported Ashkenazi Jewish ancestry. There is no known consanguinity.  GENETIC TESTING:  At the time of Sierra Gray's visit, we recommended she pursue genetic testing for the [facility] + Tumor HRD panel.  The genetic testing was reported on 12/12/2017 through the [place] offered by [facility].  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       Tumor HRD Results- Negative for BRCA1 or BRCA2 mutations in tumor and Genomic Instability Status- Negative.   While germline mutations in ATM indicate a patient may be eligible for PARP- inhibitor therapy/ targeted therapies, her tumor testing did not identify genomic instability or tumor  BRCA1/2 mutations.   This may or may not impact current or future treatment decisions.        DISCUSSION: Mutations in the ATM gene are know to cause an increased risk of developing Breast Cancer.  The ATM gene is involved in the detection and surveillance of DNA damage.  ATM phosphorylation of BRCA1 is critical for proper response to DNA double-strand breaks.  This is believed to be the reason for the role ATM has in breast cancer risk.  Studies of these families demonstrated increased incident of breast cancer in the mothers (who are heterozygous carriers) of the affected children, thus prompting further evaluation of the relationship between breast cancer and ATM.  Women who are heterozygous ATM carriers have an increase breast cancer risk.  They have 5-fold increased risk of breast cancer <50 years, and 2-3 fold increased risk for breast cancer overall.  In families with familial breast cancer that were negative for BRCA1 or BRCA2 genes, approximately 2.7% of women were found to have one ATM mutation.  In families with both breast cancer and leukemia, 6.7% of women were found to have one ATM mutation.  There has been some evidence that radiation treatment may increase the risk for breast cancer in the contralateral breast. Despite this risk, we do not recommend declining radiation treatment for her breast cancer if it is recommended, as the risk for having a recurrence of breast cancer based on not going through radiation may be greater than her risk for getting breast cancer again from the radiation.   Management for individuals with ATM mutations can be found in the NCCN guidelines (v.2.2019). These guidelines recommend the following:    Individuals with a ATM mutation are at a greater risk for having children with Ataxia-telangiectasia (A-T). AT is characterized by progressive cerebellar degeneration (ataxia), dilated blood vessels in the eyes and skin (telangiectasia), immunodeficiency,  chromosomal instability, increased sensitivity to ionizing radiation and a predisposition to lymphoma and leukemia.  Therefore, individuals of childbearing age who have a known ATM mutation may want to consider having their spouse tested to determine their risk for having a child with A-T.  Breast Cancer The risk for developing breast cancer is thought to be between 17-52%. Family history of breast cancer plays a role in the lifetime risk.  Family's with a strong family history have risks at the higher end of this range, and those with no family history towards the lower end of this range.    Screening: Annual mammogram with consideration of tomosynthesis and consider breast MRI with contrast starting at age 35 years.  Risk Reducing Mastectomy: Evidence insufficient, consider based on family history  Note: Breast density is C. Heterogeneously dense 2D digital screening bilateral mammogram with 3D Tomo with CAD on 12/15/2017 revealed no findings suspicious for malignancy.  Ovarian Cancer  Possible increase in ovarian cancer risk- insufficient evidence for medical management recommendations.    Given the limited data we do not know if her ATM mutation could have cause her ovarian cancer.   Other Cancer Risks  Increased risk for pancreatic cancer risk has been reported.  However, this data is limited and there is insufficient evidence for medical management recommendations.     Given Ms. Rishel's family history of pancreatic cancer in her father we discussed that pancreatic cancer screening is something that has been studied and is recently available.  It typically consists of MRCP and Endoscopic Ultrasound (EUS) alternating annually.  However, these screening methods have not been proven to be highly effective at detecting all pancreatic cancers and the data regarding survival benefit is very limited.  Given the limited evidence regarding pancreatic cancer risk in ATM mutation carriers as well  as the limited information about the effectiveness of pancreatic screening methods, the utility and benefit of this screening for Ms. Rotenberry is uncertain.  There are no guidelines or recommendations regarding pancreatic screening in individuals with ATM mutations, however if she was interested in pursuing this option or discussing it further we could refer her to Dr. Burr Medico the oncologist who is involved with this type of screening. Additionally, because this screening method is not a national guideline, it is uncertain if this screening would be covered by insurance.   Unknown or insufficient evidence for pancreatic or prostate cancer risk.   FAMILY MEMBERS: It is important that all of Ms. Sullivant's relatives (both men and women) know of the presence of this gene mutation. Genetic tesing can sort out who in the family is at risk and who is not.   Ms. Cuneo siblings have a 50% chance to have inherited this mutation. We recommend they have genetic testing for this same mutation, as identifying the presence of this mutation would allow them to also take advantage of risk-reducing measures.    At this time we do not know if this mutation was inherited from Ms. Egge's mother or father, and therefore we recommend relatives on both sides of the family have genetic counseling and genetic testing for this familial ATM mutation.    SUPPORT AND RESOURCES: If Ms. Haggar is interested in more information and support, there are two groups, Facing Our Risk (www.facingourrisk.com) and Bright Pink (www.brightpink.org) which some people have found useful. They provide opportunities to speak with other individuals from high-risk families. To locate genetic counselors in other cities, visit the website of the [employer] (ArtistMovie.se) and Secretary/administrator for a Social worker by zip code.  PLAN: Ms. Jimenez expressed that she would like to have her oncologist Dr. Alvy Bimler follow up with her  regarding the recommended screening and management for this finding.   We encouraged Ms. Beal to remain in contact with [name] on an annual basis so we can update her personal and family histories, and let her know of advances in cancer genetics that may benefit the family. Our contact number was provided. Ms. Rasor questions were answered to her satisfaction today, and she knows she is welcome to call anytime with additional questions.   Tana Felts, MS Genetic Counselor [email or web address] phone: [phone]

## 2017-12-19 NOTE — Telephone Encounter (Signed)
Ms. Faust and I had talked earlier this week about trying to meet after her infusion appointment on 12/18/2016.  This did not work our for her an my schedules and so I was calling to follow up and offer her a follow-up appointment to discuss these results.  She told me that she was actually able to talk to Dr. Alvy Bimler about these results yesterday and does not have any questions at this time.  She says she will continue to work with Dr. Alvy Bimler to follow-up on these results and will contact me if she would like to meet, has questions, or her family members have questions/would like testing.   I will send her a letter summarizing her appointment, and a copy of her testing results.

## 2017-12-24 ENCOUNTER — Encounter: Payer: Self-pay | Admitting: Genetics

## 2017-12-25 ENCOUNTER — Other Ambulatory Visit: Payer: Self-pay | Admitting: Oncology

## 2017-12-30 ENCOUNTER — Encounter (HOSPITAL_COMMUNITY): Payer: Self-pay

## 2018-01-08 ENCOUNTER — Inpatient Hospital Stay (HOSPITAL_BASED_OUTPATIENT_CLINIC_OR_DEPARTMENT_OTHER): Payer: BLUE CROSS/BLUE SHIELD | Admitting: Hematology and Oncology

## 2018-01-08 ENCOUNTER — Encounter: Payer: Self-pay | Admitting: Hematology and Oncology

## 2018-01-08 ENCOUNTER — Inpatient Hospital Stay: Payer: BLUE CROSS/BLUE SHIELD

## 2018-01-08 DIAGNOSIS — Z79899 Other long term (current) drug therapy: Secondary | ICD-10-CM

## 2018-01-08 DIAGNOSIS — C562 Malignant neoplasm of left ovary: Secondary | ICD-10-CM

## 2018-01-08 DIAGNOSIS — D61818 Other pancytopenia: Secondary | ICD-10-CM | POA: Diagnosis not present

## 2018-01-08 DIAGNOSIS — Z1501 Genetic susceptibility to malignant neoplasm of breast: Secondary | ICD-10-CM

## 2018-01-08 LAB — CBC WITH DIFFERENTIAL/PLATELET
BASOS ABS: 0 10*3/uL (ref 0.0–0.1)
BASOS PCT: 1 %
EOS PCT: 1 %
Eosinophils Absolute: 0 10*3/uL (ref 0.0–0.5)
HCT: 30.2 % — ABNORMAL LOW (ref 34.8–46.6)
Hemoglobin: 10.1 g/dL — ABNORMAL LOW (ref 11.6–15.9)
LYMPHS PCT: 33 %
Lymphs Abs: 1 10*3/uL (ref 0.9–3.3)
MCH: 30 pg (ref 25.1–34.0)
MCHC: 33.3 g/dL (ref 31.5–36.0)
MCV: 89.9 fL (ref 79.5–101.0)
MONO ABS: 0.5 10*3/uL (ref 0.1–0.9)
Monocytes Relative: 15 %
NEUTROS ABS: 1.6 10*3/uL (ref 1.5–6.5)
Neutrophils Relative %: 50 %
PLATELETS: 173 10*3/uL (ref 145–400)
RBC: 3.36 MIL/uL — AB (ref 3.70–5.45)
RDW: 26 % — AB (ref 11.2–14.5)
WBC: 3.2 10*3/uL — AB (ref 3.9–10.3)

## 2018-01-08 LAB — COMPREHENSIVE METABOLIC PANEL
ALBUMIN: 3.9 g/dL (ref 3.5–5.0)
ALT: 15 U/L (ref 0–55)
AST: 14 U/L (ref 5–34)
Alkaline Phosphatase: 68 U/L (ref 40–150)
Anion gap: 9 (ref 3–11)
BUN: 13 mg/dL (ref 7–26)
CHLORIDE: 106 mmol/L (ref 98–109)
CO2: 26 mmol/L (ref 22–29)
CREATININE: 0.83 mg/dL (ref 0.60–1.10)
Calcium: 9.9 mg/dL (ref 8.4–10.4)
GFR calc Af Amer: >60 mL/min (ref >60)
GFR calc non Af Amer: >60 mL/min (ref >60)
GLUCOSE: 85 mg/dL (ref 70–140)
POTASSIUM: 3.8 mmol/L (ref 3.5–5.1)
Sodium: 141 mmol/L (ref 136–145)
Total Bilirubin: 0.3 mg/dL (ref 0.2–1.2)
Total Protein: 7.1 g/dL (ref 6.4–8.3)

## 2018-01-08 MED ORDER — SODIUM CHLORIDE 0.9% FLUSH
10.0000 mL | Freq: Once | INTRAVENOUS | Status: AC
  Administered 2018-01-08: 10 mL via INTRAVENOUS
  Filled 2018-01-08: qty 10

## 2018-01-08 MED ORDER — PALONOSETRON HCL INJECTION 0.25 MG/5ML
INTRAVENOUS | Status: AC
  Filled 2018-01-08: qty 5

## 2018-01-08 MED ORDER — SODIUM CHLORIDE 0.9% FLUSH
10.0000 mL | INTRAVENOUS | Status: DC | PRN
  Filled 2018-01-08: qty 10

## 2018-01-08 MED ORDER — PALONOSETRON HCL INJECTION 0.25 MG/5ML
0.2500 mg | Freq: Once | INTRAVENOUS | Status: AC
  Administered 2018-01-08: 0.25 mg via INTRAVENOUS

## 2018-01-08 MED ORDER — ALTEPLASE 2 MG IJ SOLR
2.0000 mg | Freq: Once | INTRAMUSCULAR | Status: AC
  Administered 2018-01-08: 2 mg
  Filled 2018-01-08: qty 2

## 2018-01-08 MED ORDER — HEPARIN SOD (PORK) LOCK FLUSH 100 UNIT/ML IV SOLN
500.0000 [IU] | Freq: Once | INTRAVENOUS | Status: DC | PRN
  Filled 2018-01-08: qty 5

## 2018-01-08 MED ORDER — SODIUM CHLORIDE 0.9 % IV SOLN
Freq: Once | INTRAVENOUS | Status: AC
  Administered 2018-01-08: 10:00:00 via INTRAVENOUS

## 2018-01-08 MED ORDER — SODIUM CHLORIDE 0.9 % IV SOLN
Freq: Once | INTRAVENOUS | Status: AC
  Administered 2018-01-08: 10:00:00 via INTRAVENOUS
  Filled 2018-01-08: qty 5

## 2018-01-08 MED ORDER — PACLITAXEL PROTEIN-BOUND CHEMO INJECTION 100 MG
125.0000 mg/m2 | Freq: Once | INTRAVENOUS | Status: AC
  Administered 2018-01-08: 250 mg via INTRAVENOUS
  Filled 2018-01-08: qty 50

## 2018-01-08 MED ORDER — SODIUM CHLORIDE 0.9 % IV SOLN
680.0000 mg | Freq: Once | INTRAVENOUS | Status: AC
  Administered 2018-01-08: 680 mg via INTRAVENOUS
  Filled 2018-01-08: qty 68

## 2018-01-08 NOTE — Assessment & Plan Note (Signed)
She tolerated recent chemotherapy well  She started to develop alopecia She denies peripheral neuropathy We would proceed with treatment today without dose adjustment, for total of 6 cycles of therapy

## 2018-01-08 NOTE — Assessment & Plan Note (Signed)
She has mild pancytopenia from treatment but not symptomatic We would proceed with same dose of treatment without dose adjustment

## 2018-01-08 NOTE — Progress Notes (Signed)
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 Soft tissue, biopsy, right medial thigh MATURE LIPOMA 3. Ovary and fallopian tube, right ENDOMETRIOMA AND SIMPLE SEROUS CYST WITH FALLOPIAN TUBE ADHESION NEGATIVE FOR MALIGNANCY 4. Omentum, resection for tumor BENIGN OMENTUM NEGATIVE FOR CARCINOMA 5. Lymph nodes, regional resection, right pelvic EIGHT BENIGN LYMPH NODES (0/8) 6. Lymph nodes, regional resection, left pelvic SEVEN BENIGN LYMPH NODES (0/7) 7. Lymph node, biopsy, right para-aortic FOUR BENIGN LYMPH NODES (0/4) 8. Lymph node, biopsy, left para-aortic ONE BENIGN LYMPH NODE (0/1) 9. Peritoneum, biopsy, left abdominal BENIGN FIBROMUSCULAR TISSUE 10. Peritoneum, biopsy, right abdominal BENIGN FIBROMUSCULAR TISSUE WITH SEROSITIS 11. Peritoneum, biopsy, pelvic FIBROADIPOSE TISSUE WITH SEROSITIS  Specimen(s): Ovary and fallopian tube Procedure: (including lymph node sampling): salpingo-oophorectomy Primary tumor site (including laterality): Left ovary Ovarian surface involvement: Yes Ovarian capsule intact without fragmentation: intact Maximum tumor size (cm): 20.0 cm Histologic type: Endometrioid carcinoma Grade: 1 Peritoneal implants: (specify invasive or non-invasive):  Negative Pelvic extension (list additional structures on separate lines and if involved): Negative Lymph nodes: number examined 20 ; number positive 0 TNM code: pT1c2, pN0, pMx FIGO Stage (based on pathologic findings, needs clinical correlation): IC2      09/11/2017 Surgery    Preoperative Diagnosis: 1. left adnexal mass.  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   Procedure(s) Performed: 1. Exploratory laparotomy with bilateral salpingo-oophorectomy, pelvic and para-aortic lymph node dissection, omentectomy and radical debulking for ovarian cancer (CPT [phone])  Surgeon: Everitt Amber, M.D. Operative Findings:20 cm left ovarian mass.   No intraperitoneal rupture occurred;  Frozen pathology was consistent with adenocarcinoma; right tube and ovary normal in appearance; 3) normal bilateral pelvic and para-aortic lymph nodes and omentum; small and large bowel to palpation. This represented an optimal cytoreduction with no gross visible disease remaining.       10/09/2017 Tumor Marker    Patient's tumor was tested for the following markers: CA-125 Results of the tumor marker test revealed 68.7      10/15/2017 Adverse Reaction    She developed reaction to Paclitaxel.      10/15/2017 -  Chemotherapy    She received carboplatin. Due to infusion reaction to Taxol with cycle 1, treatment was switched to carboplatin and Abraxane      11/03/2017 Procedure    Successful placement of a right internal jugular approach power injectable Port-A-Cath. The catheter is ready for immediate use.      11/27/2017 Tumor Marker    Patient's tumor was tested for the following markers: CA-125 Results of the tumor marker test revealed 27.7      12/12/2017 Genetic Testing    The patient had genetic testing due to a personal history of ovarian cancer and a family history of  pancreatic cancer (and possibly breast cancer).  The [facility] Hereditary Cancer Panel + tumor HRD  analysis was ordered from the laboratory [facility]. The [facility] gene panel offered by [employer] includes sequencing and deletion/duplication testing of the following 29 genes: APC, ATM, BARD1, BMPR1A, BRCA1, BRCA2, BRIP1, CHD1, CDK4, CDKN2A, CHEK2, EPCAM (large rearrangement only), HOXB13, MLH1, MSH2, MSH6, MUTYH, NBN, PALB2, PMS2, PTEN, RAD51C, RAD51D, SMAD4, STK11, and TP53. Sequencing was performed for select regions of POLE and POLD1, and large rearrangement analysis was performed for select regions of GREM1.  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 [id number] ([id number]).  The date of this test report is 12/12/2017.       INTERVAL HISTORY: Please see below for problem oriented charting. She returns for cycle 5 of treatment She denies recent infection No new peripheral neuropathy Appetite is stable, no recent weight loss She has numerous concerns in regards to recent positive positive genetic testing  REVIEW OF SYSTEMS:   Constitutional: Denies fevers, chills or abnormal weight loss Eyes: Denies blurriness of vision Ears, nose, mouth, throat, and face: Denies mucositis or sore throat Respiratory: Denies cough, dyspnea or wheezes Cardiovascular: Denies palpitation, chest discomfort or lower extremity swelling Gastrointestinal:  Denies nausea, heartburn or change in bowel habits Skin: Denies abnormal skin rashes Lymphatics: Denies new lymphadenopathy or easy bruising Neurological:Denies numbness, tingling or new weaknesses Behavioral/Psych: Mood is stable, no new changes  All other systems were reviewed with the patient and are negative.  I have reviewed the past medical history, past surgical history, social history and family history with the patient and they are unchanged from previous note.  ALLERGIES:  is allergic to bee venom; chlorhexidine; paclitaxel; other; bacitracin; and iodine.  MEDICATIONS:  Current Outpatient Medications   Medication Sig Dispense Refill  . acetaminophen (TYLENOL) 325 MG tablet Take 650 mg 2 (two) times daily as needed by mouth for headache.    Marland Kitchen amLODipine (NORVASC) 5 MG tablet Take 5 mg by mouth every evening.    . Ascorbic Acid (VITAMIN C) 1000 MG tablet Take 1,000 mg by mouth every morning.     . lidocaine-prilocaine (EMLA) cream Apply 1 application topically as needed. 30 g 6  . Liniments (SALONPAS PAIN RELIEF PATCH EX) Place 1 patch onto the skin daily as needed (knee pain).    Marland Kitchen loratadine (CLARITIN) 10 MG tablet Take 10 mg by mouth every morning.     . Melatonin 1 MG CAPS Take 1 mg by mouth at bedtime.    . Menthol, Topical Analgesic, 4 % GEL Apply 1 application daily as needed topically (headache).    . Methylsulfonylmethane (MSM) 1000 MG CAPS Take 1,000 mg by mouth every morning.     . Multiple Vitamins-Minerals (HAIR/SKIN/NAILS) TABS Take 2 tablets by mouth every morning.    . ondansetron (ZOFRAN) 8 MG tablet Take 1 tablet (8 mg total) by mouth every 8 (eight) hours as needed for refractory nausea / vomiting. Start on day 3 after chemo. 60 tablet 1  . prochlorperazine (COMPAZINE) 10 MG tablet Take 1 tablet (10 mg total) by mouth every 6 (six) hours as needed (Nausea or vomiting). (Patient not taking: Reported on 10/22/2017) 30 tablet 1  . UNABLE TO FIND CPAP     No current facility-administered medications for this visit.    Facility-Administered Medications Ordered in Other Visits  Medication Dose Route Frequency Provider Last Rate Last Dose  . CARBOplatin (PARAPLATIN) 680 mg in sodium chloride 0.9 % 250 mL chemo  infusion  680 mg Intravenous Once Alvy Bimler, Ozella Comins, MD      . heparin lock flush 100 unit/mL  500 Units Intracatheter Once PRN Alvy Bimler, Minervia Osso, MD      . PACLitaxel-protein bound (ABRAXANE) chemo infusion 250 mg  125 mg/m2 (Treatment Plan Recorded) Intravenous Once Jaze Rodino, MD      . sodium chloride flush (NS) 0.9 % injection 10 mL  10 mL Intracatheter PRN Alvy Bimler, Graycen Sadlon, MD         PHYSICAL EXAMINATION: ECOG PERFORMANCE STATUS: 1 - Symptomatic but completely ambulatory  Vitals:   01/08/18 0916  BP: (!) 135/91  Pulse: 87  Resp: 18  Temp: 97.9 F (36.6 C)  SpO2: 100%   Filed Weights   01/08/18 0916  Weight: 178 lb 4.8 oz (80.9 kg)    GENERAL:alert, no distress and comfortable SKIN: skin color, texture, turgor are normal, no rashes or significant lesions EYES: normal, Conjunctiva are pink and non-injected, sclera clear OROPHARYNX:no exudate, no erythema and lips, buccal mucosa, and tongue normal  NECK: supple, thyroid normal size, non-tender, without nodularity LYMPH:  no palpable lymphadenopathy in the cervical, axillary or inguinal LUNGS: clear to auscultation and percussion with normal breathing effort HEART: regular rate & rhythm and no murmurs and no lower extremity edema ABDOMEN:abdomen soft, non-tender and normal bowel sounds Musculoskeletal:no cyanosis of digits and no clubbing  NEURO: alert & oriented x 3 with fluent speech, no focal motor/sensory deficits  LABORATORY DATA:  I have reviewed the data as listed    Component Value Date/Time   NA 141 01/08/2018 0805   NA 141 11/27/2017 0818   K 3.8 01/08/2018 0805   K 3.8 11/27/2017 0818   CL 106 01/08/2018 0805   CO2 26 01/08/2018 0805   CO2 25 11/27/2017 0818   GLUCOSE 85 01/08/2018 0805   GLUCOSE 90 11/27/2017 0818   BUN 13 01/08/2018 0805   BUN 12.9 11/27/2017 0818   CREATININE 0.83 01/08/2018 0805   CREATININE 0.8 11/27/2017 0818   CALCIUM 9.9 01/08/2018 0805   CALCIUM 9.9 11/27/2017 0818   PROT 7.1 01/08/2018 0805   PROT 7.0 11/27/2017 0818   ALBUMIN 3.9 01/08/2018 0805   ALBUMIN 3.8 11/27/2017 0818   AST 14 01/08/2018 0805   AST 11 11/27/2017 0818   ALT 15 01/08/2018 0805   ALT 11 11/27/2017 0818   ALKPHOS 68 01/08/2018 0805   ALKPHOS 68 11/27/2017 0818   BILITOT 0.3 01/08/2018 0805   BILITOT 0.41 11/27/2017 0818   GFRNONAA >60 01/08/2018 0805   GFRAA >60 01/08/2018  0805    No results found for: SPEP, UPEP  Lab Results  Component Value Date   WBC 3.2 (L) 01/08/2018   NEUTROABS 1.6 01/08/2018   HGB 10.1 (L) 01/08/2018   HCT 30.2 (L) 01/08/2018   MCV 89.9 01/08/2018   PLT 173 01/08/2018      Chemistry      Component Value Date/Time   NA 141 01/08/2018 0805   NA 141 11/27/2017 0818   K 3.8 01/08/2018 0805   K 3.8 11/27/2017 0818   CL 106 01/08/2018 0805   CO2 26 01/08/2018 0805   CO2 25 11/27/2017 0818   BUN 13 01/08/2018 0805   BUN 12.9 11/27/2017 0818   CREATININE 0.83 01/08/2018 0805   CREATININE 0.8 11/27/2017 0818      Component Value Date/Time   CALCIUM 9.9 01/08/2018 0805   CALCIUM 9.9 11/27/2017 0818   ALKPHOS 68 01/08/2018 0805   ALKPHOS 68 11/27/2017 0818  AST 14 01/08/2018 0805   AST 11 11/27/2017 0818   ALT 15 01/08/2018 0805   ALT 11 11/27/2017 0818   BILITOT 0.3 01/08/2018 0805   BILITOT 0.41 11/27/2017 0818       RADIOGRAPHIC STUDIES: I have personally reviewed the radiological images as listed and agreed with the findings in the report. Mm Screening Breast Tomo Bilateral  Result Date: 12/15/2017 CLINICAL DATA:  Screening. EXAM: 2D DIGITAL SCREENING BILATERAL MAMMOGRAM WITH 3D TOMO WITH CAD COMPARISON:  Previous exam(s). ACR Breast Density Category c: The breast tissue is heterogeneously dense, which may obscure small masses. FINDINGS: There are no findings suspicious for malignancy. Images were processed with CAD. IMPRESSION: No mammographic evidence of malignancy. A result letter of this screening mammogram will be mailed directly to the patient. RECOMMENDATION: Screening mammogram in one year. (Code:SM-B-01Y) BI-RADS CATEGORY  1: Negative. Electronically Signed   By: Marin Olp M.D.   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  All questions were answered. The patient knows to call the clinic with any problems, questions or concerns. No barriers to learning was detected. I spent 30 minutes counseling the patient face to face. The total time spent in the appointment was 40 minutes and more than 50% was on counseling and review of test results     Heath Lark, MD 01/08/2018 11:05 AM

## 2018-01-08 NOTE — Patient Instructions (Signed)

## 2018-01-08 NOTE — Assessment & Plan Note (Signed)
I discussed extensively about plan of care in regards to positive genetic testing I reviewed with her current recommendation in regards to risk reduction strategy for breast cancer including consideration for surgical options, intensive screening mammogram alternate with MRI of the breast or consideration to start on tamoxifen/raloxifene We also discussed strategies to reduce the risk of pancreatic cancer with aggressive surveillance modality

## 2018-01-08 NOTE — Patient Instructions (Signed)
[facility] Discharge Instructions for Patients Receiving Chemotherapy  Today you received the following chemotherapy agents Carboplatin and Abraxane.   To help prevent nausea and vomiting after your treatment, we encourage you to take your nausea medication as directed.   If you develop nausea and vomiting that is not controlled by your nausea medication, call the clinic.   BELOW ARE SYMPTOMS THAT SHOULD BE REPORTED IMMEDIATELY:  *FEVER GREATER THAN 100.5 F  *CHILLS WITH OR WITHOUT FEVER  NAUSEA AND VOMITING THAT IS NOT CONTROLLED WITH YOUR NAUSEA MEDICATION  *UNUSUAL SHORTNESS OF BREATH  *UNUSUAL BRUISING OR BLEEDING  TENDERNESS IN MOUTH AND THROAT WITH OR WITHOUT PRESENCE OF ULCERS  *URINARY PROBLEMS  *BOWEL PROBLEMS  UNUSUAL RASH Items with * indicate a potential emergency and should be followed up as soon as possible.  Feel free to call the clinic should you have any questions or concerns. The clinic phone number is [phone].  Please show the CHEMO ALERT CARD at check-in to the Emergency Department and triage nurse.   

## 2018-01-09 LAB — CA 125: Cancer Antigen (CA) 125: 17.5 U/mL (ref 0.0–38.1)

## 2018-01-29 ENCOUNTER — Inpatient Hospital Stay: Payer: BLUE CROSS/BLUE SHIELD

## 2018-01-29 ENCOUNTER — Telehealth: Payer: Self-pay | Admitting: Hematology and Oncology

## 2018-01-29 ENCOUNTER — Inpatient Hospital Stay: Payer: BLUE CROSS/BLUE SHIELD | Attending: Hematology and Oncology | Admitting: Hematology and Oncology

## 2018-01-29 ENCOUNTER — Encounter: Payer: Self-pay | Admitting: Hematology and Oncology

## 2018-01-29 DIAGNOSIS — R5381 Other malaise: Secondary | ICD-10-CM | POA: Diagnosis not present

## 2018-01-29 DIAGNOSIS — C562 Malignant neoplasm of left ovary: Secondary | ICD-10-CM | POA: Insufficient documentation

## 2018-01-29 DIAGNOSIS — Z5111 Encounter for antineoplastic chemotherapy: Secondary | ICD-10-CM | POA: Insufficient documentation

## 2018-01-29 DIAGNOSIS — Z1501 Genetic susceptibility to malignant neoplasm of breast: Secondary | ICD-10-CM | POA: Diagnosis not present

## 2018-01-29 DIAGNOSIS — Z800 Family history of malignant neoplasm of digestive organs: Secondary | ICD-10-CM | POA: Insufficient documentation

## 2018-01-29 DIAGNOSIS — D61818 Other pancytopenia: Secondary | ICD-10-CM | POA: Diagnosis not present

## 2018-01-29 LAB — CBC WITH DIFFERENTIAL/PLATELET
BASOS ABS: 0 10*3/uL (ref 0.0–0.1)
BASOS PCT: 0 %
EOS ABS: 0.1 10*3/uL (ref 0.0–0.5)
EOS PCT: 1 %
HCT: 30.3 % — ABNORMAL LOW (ref 34.8–46.6)
Hemoglobin: 9.8 g/dL — ABNORMAL LOW (ref 11.6–15.9)
Lymphocytes Relative: 36 %
Lymphs Abs: 1.3 10*3/uL (ref 0.9–3.3)
MCH: 31.3 pg (ref 25.1–34.0)
MCHC: 32.3 g/dL (ref 31.5–36.0)
MCV: 96.8 fL (ref 79.5–101.0)
MONO ABS: 0.4 10*3/uL (ref 0.1–0.9)
MONOS PCT: 12 %
NEUTROS ABS: 1.8 10*3/uL (ref 1.5–6.5)
Neutrophils Relative %: 51 %
PLATELETS: 157 10*3/uL (ref 145–400)
RBC: 3.13 MIL/uL — ABNORMAL LOW (ref 3.70–5.45)
RDW: 21.6 % — AB (ref 11.2–14.5)
WBC: 3.7 10*3/uL — ABNORMAL LOW (ref 3.9–10.3)

## 2018-01-29 LAB — COMPREHENSIVE METABOLIC PANEL
ALBUMIN: 3.9 g/dL (ref 3.5–5.0)
ALK PHOS: 65 U/L (ref 40–150)
ALT: 14 U/L (ref 0–55)
ANION GAP: 12 — AB (ref 3–11)
AST: 12 U/L (ref 5–34)
BILIRUBIN TOTAL: 0.4 mg/dL (ref 0.2–1.2)
BUN: 15 mg/dL (ref 7–26)
CALCIUM: 10.2 mg/dL (ref 8.4–10.4)
CO2: 24 mmol/L (ref 22–29)
CREATININE: 0.83 mg/dL (ref 0.60–1.10)
Chloride: 106 mmol/L (ref 98–109)
GFR calc Af Amer: >60 mL/min (ref >60)
GFR calc non Af Amer: >60 mL/min (ref >60)
GLUCOSE: 113 mg/dL (ref 70–140)
Potassium: 3.5 mmol/L (ref 3.5–5.1)
Sodium: 142 mmol/L (ref 136–145)
TOTAL PROTEIN: 6.9 g/dL (ref 6.4–8.3)

## 2018-01-29 MED ORDER — PACLITAXEL PROTEIN-BOUND CHEMO INJECTION 100 MG
125.0000 mg/m2 | Freq: Once | INTRAVENOUS | Status: AC
  Administered 2018-01-29: 250 mg via INTRAVENOUS
  Filled 2018-01-29: qty 50

## 2018-01-29 MED ORDER — HEPARIN SOD (PORK) LOCK FLUSH 100 UNIT/ML IV SOLN
500.0000 [IU] | Freq: Once | INTRAVENOUS | Status: AC | PRN
  Administered 2018-01-29: 500 [IU]
  Filled 2018-01-29: qty 5

## 2018-01-29 MED ORDER — PALONOSETRON HCL INJECTION 0.25 MG/5ML
0.2500 mg | Freq: Once | INTRAVENOUS | Status: AC
  Administered 2018-01-29: 0.25 mg via INTRAVENOUS

## 2018-01-29 MED ORDER — SODIUM CHLORIDE 0.9% FLUSH
10.0000 mL | INTRAVENOUS | Status: DC | PRN
  Administered 2018-01-29: 10 mL via INTRAVENOUS
  Filled 2018-01-29: qty 10

## 2018-01-29 MED ORDER — SODIUM CHLORIDE 0.9 % IV SOLN
Freq: Once | INTRAVENOUS | Status: AC
  Administered 2018-01-29: 11:00:00 via INTRAVENOUS
  Filled 2018-01-29: qty 5

## 2018-01-29 MED ORDER — SODIUM CHLORIDE 0.9% FLUSH
10.0000 mL | INTRAVENOUS | Status: DC | PRN
  Administered 2018-01-29: 10 mL
  Filled 2018-01-29: qty 10

## 2018-01-29 MED ORDER — PALONOSETRON HCL INJECTION 0.25 MG/5ML
INTRAVENOUS | Status: AC
Start: 2018-01-29 — End: 2018-01-29
  Filled 2018-01-29: qty 5

## 2018-01-29 MED ORDER — SODIUM CHLORIDE 0.9 % IV SOLN
680.0000 mg | Freq: Once | INTRAVENOUS | Status: AC
  Administered 2018-01-29: 680 mg via INTRAVENOUS
  Filled 2018-01-29: qty 68

## 2018-01-29 NOTE — Progress Notes (Signed)
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 She had insurance denial related to recent genetic testing and I will consult with genetic counselor for this   No orders of the defined types were placed in this encounter.   INTERVAL HISTORY: Please see below for problem oriented charting. 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 Soft tissue, biopsy, right medial thigh MATURE LIPOMA 3. Ovary and fallopian tube, right ENDOMETRIOMA AND SIMPLE SEROUS CYST WITH FALLOPIAN TUBE ADHESION NEGATIVE FOR MALIGNANCY 4. Omentum, resection for tumor BENIGN OMENTUM NEGATIVE FOR CARCINOMA 5. Lymph nodes, regional resection, right pelvic EIGHT BENIGN LYMPH NODES (0/8) 6. Lymph nodes, regional resection, left pelvic SEVEN BENIGN LYMPH NODES (0/7) 7. Lymph node, biopsy, right para-aortic FOUR BENIGN LYMPH NODES (0/4) 8. Lymph node, biopsy, left para-aortic ONE BENIGN LYMPH NODE (0/1) 9. Peritoneum, biopsy, left abdominal BENIGN FIBROMUSCULAR TISSUE 10. Peritoneum, biopsy, right abdominal BENIGN FIBROMUSCULAR TISSUE WITH SEROSITIS 11. Peritoneum, biopsy, pelvic FIBROADIPOSE TISSUE WITH SEROSITIS  Specimen(s): Ovary and fallopian tube Procedure: (including lymph node sampling): salpingo-oophorectomy Primary tumor site (including laterality): Left ovary Ovarian surface involvement: Yes Ovarian capsule intact without fragmentation: intact Maximum tumor size (cm): 20.0 cm Histologic type: Endometrioid carcinoma Grade: 1 Peritoneal implants: (specify invasive or non-invasive): Negative Pelvic extension (list additional structures on separate lines and if involved): Negative Lymph nodes: number examined 20 ; number positive 0 TNM code: pT1c2, pN0, pMx FIGO Stage (based on pathologic findings, needs  clinical correlation): IC2  09/11/2017 Surgery    Preoperative Diagnosis: 1. left adnexal mass.  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   Procedure(s) Performed: 1. Exploratory laparotomy with bilateral salpingo-oophorectomy, pelvic and para-aortic lymph node dissection, omentectomy and radical debulking for ovarian cancer (CPT [id number])  Surgeon: Everitt Amber, M.D. Operative Findings:20 cm left ovarian mass.   No intraperitoneal rupture occurred;  Frozen pathology was consistent with adenocarcinoma; right tube and ovary normal in appearance; 3) normal bilateral pelvic and para-aortic lymph nodes and omentum; small and large bowel to palpation. This represented an optimal cytoreduction with no gross visible disease remaining.       10/09/2017 Tumor Marker    Patient's tumor was tested for the following markers: CA-125 Results of the tumor marker test revealed 68.7      10/15/2017 Adverse Reaction    She developed reaction to Paclitaxel.      10/15/2017 -  Chemotherapy    She received carboplatin. Due to infusion reaction to Taxol with cycle 1, treatment was switched to carboplatin and Abraxane      11/03/2017 Procedure    Successful placement of a right internal jugular approach power injectable Port-A-Cath. The catheter is ready for immediate use.      11/27/2017 Tumor Marker    Patient's tumor was tested for the following markers: CA-125 Results of the tumor marker test revealed 27.7      12/12/2017 Genetic Testing    The patient had genetic testing due to a personal history of ovarian cancer and a family history of pancreatic cancer (and possibly breast cancer).  The [facility] Hereditary Cancer Panel + tumor HRD analysis was ordered from the laboratory [facility]. The [facility] gene panel offered by [employer] includes sequencing and deletion/duplication testing of the following 29 genes: APC, ATM, BARD1, BMPR1A, BRCA1,  BRCA2, BRIP1, CHD1, CDK4, CDKN2A, CHEK2, EPCAM (large rearrangement only), HOXB13, MLH1, MSH2, MSH6, MUTYH, NBN, PALB2, PMS2, PTEN, RAD51C, RAD51D, SMAD4, STK11, and TP53. Sequencing was performed for select regions of POLE and POLD1, and large rearrangement analysis was performed for select regions of GREM1.  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 [id number] ([id number]).  The date of this test report is 12/12/2017.      01/08/2018 Tumor Marker    Patient's tumor was tested for the following markers: CA-125 Results of the tumor marker test revealed 17.5       REVIEW OF SYSTEMS:   Constitutional: Denies fevers, chills or abnormal weight loss Eyes: Denies blurriness of vision Ears, nose, mouth, throat, and face: Denies mucositis or sore throat Respiratory: Denies cough, dyspnea or wheezes Cardiovascular: Denies palpitation, chest discomfort or lower extremity swelling Gastrointestinal:  Denies nausea, heartburn or change in bowel habits Skin: Denies abnormal skin rashes Lymphatics: Denies new lymphadenopathy or easy bruising Neurological:Denies numbness, tingling or new weaknesses Behavioral/Psych: Mood is stable, no new changes  All other systems were reviewed with the patient and are negative.  I have reviewed the past medical history, past surgical history, social history and family history with the patient and they are unchanged from previous note.  ALLERGIES:  is allergic to bee venom; chlorhexidine; paclitaxel; other; bacitracin; and iodine.  MEDICATIONS:  Current Outpatient Medications  Medication Sig Dispense Refill  . acetaminophen (TYLENOL) 325 MG tablet Take 650 mg 2 (two) times daily as needed by mouth for headache.    Marland Kitchen amLODipine (NORVASC) 5 MG tablet Take 5 mg by mouth every evening.    . Ascorbic Acid (VITAMIN  C) 1000 MG tablet Take 1,000 mg by mouth every morning.     . lidocaine-prilocaine (EMLA) cream Apply 1 application topically  as needed. 30 g 6  . Liniments (SALONPAS PAIN RELIEF PATCH EX) Place 1 patch onto the skin daily as needed (knee pain).    Marland Kitchen loratadine (CLARITIN) 10 MG tablet Take 10 mg by mouth every morning.     . Melatonin 1 MG CAPS Take 1 mg by mouth at bedtime.    . Menthol, Topical Analgesic, 4 % GEL Apply 1 application daily as needed topically (headache).    . Methylsulfonylmethane (MSM) 1000 MG CAPS Take 1,000 mg by mouth every morning.     . Multiple Vitamins-Minerals (HAIR/SKIN/NAILS) TABS Take 2 tablets by mouth every morning.    . ondansetron (ZOFRAN) 8 MG tablet Take 1 tablet (8 mg total) by mouth every 8 (eight) hours as needed for refractory nausea / vomiting. Start on day 3 after chemo. 60 tablet 1  . prochlorperazine (COMPAZINE) 10 MG tablet Take 1 tablet (10 mg total) by mouth every 6 (six) hours as needed (Nausea or vomiting). (Patient not taking: Reported on 10/22/2017) 30 tablet 1  . UNABLE TO FIND CPAP     No current facility-administered medications for this visit.    Facility-Administered Medications Ordered in Other Visits  Medication Dose Route Frequency Provider Last Rate Last Dose  . CARBOplatin (PARAPLATIN) 680 mg in sodium chloride 0.9 % 250 mL chemo infusion  680 mg Intravenous Once Malasia Torain, MD      . heparin lock flush 100 unit/mL  500 Units Intracatheter Once PRN Alvy Bimler, Jacara Benito, MD      . PACLitaxel-protein bound (ABRAXANE) chemo infusion 250 mg  125 mg/m2 (Treatment Plan Recorded) Intravenous Once Alvy Bimler, Bayan Hedstrom, MD 100 mL/hr at 01/29/18 1147 250 mg at 01/29/18 1147  . sodium chloride flush (NS) 0.9 % injection 10 mL  10 mL Intracatheter PRN Alvy Bimler, Montravious Weigelt, MD        PHYSICAL EXAMINATION: ECOG PERFORMANCE STATUS: 1 - Symptomatic but completely ambulatory  Vitals:   01/29/18 0910  BP: 135/84  Pulse: 91  Resp: 18  Temp: 98 F (36.7 C)  SpO2: 100%   Filed Weights   01/29/18 0910  Weight: 178 lb 4.8 oz (80.9 kg)    GENERAL:alert, no distress and comfortable SKIN:  skin color, texture, turgor are normal, no rashes or significant lesions EYES: normal, Conjunctiva are pink and non-injected, sclera clear OROPHARYNX:no exudate, no erythema and lips, buccal mucosa, and tongue normal  NECK: supple, thyroid normal size, non-tender, without nodularity LYMPH:  no palpable lymphadenopathy in the cervical, axillary or inguinal LUNGS: clear to auscultation and percussion with normal breathing effort HEART: regular rate & rhythm and no murmurs and no lower extremity edema ABDOMEN:abdomen soft, non-tender and normal bowel sounds.  Abdominal wall hernia is noted Musculoskeletal:no cyanosis of digits and no clubbing  NEURO: alert & oriented x 3 with fluent speech, no focal motor/sensory deficits  LABORATORY DATA:  I have reviewed the data as listed    Component Value Date/Time   NA 142 01/29/2018 0825   NA 141 11/27/2017 0818   K 3.5 01/29/2018 0825   K 3.8 11/27/2017 0818   CL 106 01/29/2018 0825   CO2 24 01/29/2018 0825   CO2 25 11/27/2017 0818   GLUCOSE 113 01/29/2018 0825   GLUCOSE 90 11/27/2017 0818   BUN 15 01/29/2018 0825   BUN 12.9 11/27/2017 0818   CREATININE 0.83 01/29/2018 0825   CREATININE  0.8 11/27/2017 0818   CALCIUM 10.2 01/29/2018 0825   CALCIUM 9.9 11/27/2017 0818   PROT 6.9 01/29/2018 0825   PROT 7.0 11/27/2017 0818   ALBUMIN 3.9 01/29/2018 0825   ALBUMIN 3.8 11/27/2017 0818   AST 12 01/29/2018 0825   AST 11 11/27/2017 0818   ALT 14 01/29/2018 0825   ALT 11 11/27/2017 0818   ALKPHOS 65 01/29/2018 0825   ALKPHOS 68 11/27/2017 0818   BILITOT 0.4 01/29/2018 0825   BILITOT 0.41 11/27/2017 0818   GFRNONAA >60 01/29/2018 0825   GFRAA >60 01/29/2018 0825    No results found for: SPEP, UPEP  Lab Results  Component Value Date   WBC 3.7 (L) 01/29/2018   NEUTROABS 1.8 01/29/2018   HGB 9.8 (L) 01/29/2018   HCT 30.3 (L) 01/29/2018   MCV 96.8 01/29/2018   PLT 157 01/29/2018      Chemistry      Component Value Date/Time   NA 142  01/29/2018 0825   NA 141 11/27/2017 0818   K 3.5 01/29/2018 0825   K 3.8 11/27/2017 0818   CL 106 01/29/2018 0825   CO2 24 01/29/2018 0825   CO2 25 11/27/2017 0818   BUN 15 01/29/2018 0825   BUN 12.9 11/27/2017 0818   CREATININE 0.83 01/29/2018 0825   CREATININE 0.8 11/27/2017 0818      Component Value Date/Time   CALCIUM 10.2 01/29/2018 0825   CALCIUM 9.9 11/27/2017 0818   ALKPHOS 65 01/29/2018 0825   ALKPHOS 68 11/27/2017 0818   AST 12 01/29/2018 0825   AST 11 11/27/2017 0818   ALT 14 01/29/2018 0825   ALT 11 11/27/2017 0818   BILITOT 0.4 01/29/2018 0825   BILITOT 0.41 11/27/2017 0818      All questions were answered. The patient knows to call the clinic with any problems, questions or concerns. No barriers to learning was detected.  I spent 25 minutes counseling the patient face to face. The total time spent in the appointment was 30 minutes and more than 50% was on counseling and review of test results  Heath Lark, MD 01/29/2018 12:16 PM

## 2018-01-29 NOTE — Telephone Encounter (Signed)
Patient declined AVS and calendar of upcoming March appointments.

## 2018-01-29 NOTE — Assessment & Plan Note (Signed)
We discussed chemoprevention with tamoxifen versus mastectomy  We discussed the importance of surveillance imaging with mammogram alternate with MRI

## 2018-01-29 NOTE — Assessment & Plan Note (Signed)
Is disabled and have lost her job We discussed implication of disability and medical insurance, etc. She had insurance denial related to recent genetic testing and I will consult with genetic counselor for this

## 2018-01-29 NOTE — Assessment & Plan Note (Signed)
She tolerated recent chemotherapy well  She started to develop alopecia She denies peripheral neuropathy We would proceed with treatment today without dose adjustment, for total of 6 cycles of therapy Plan to see her back next month for further discussion and review about plan of care.

## 2018-01-29 NOTE — Assessment & Plan Note (Signed)
She has mild pancytopenia from treatment but not symptomatic We would proceed with same dose of treatment without dose adjustment

## 2018-01-29 NOTE — Assessment & Plan Note (Signed)
She is at risk of pancreatic cancer We discussed screening modality with endoscopic ultrasound with GI and MRCP

## 2018-01-30 ENCOUNTER — Telehealth: Payer: Self-pay | Admitting: Genetics

## 2018-01-30 NOTE — Telephone Encounter (Signed)
I told Sierra Gray that we had received an approval notification of her prior authorization from her [employer].  I informed her that the laboratory, [facility], that filed the claim to insurance was going to have the most information to give her regarding her insurance notification.  I securely emailed the documentation we had approving her testing, the laboratory's card with contact information to reach out to them, and the test requisition form sent to the laboratory indicating to call if OOP cost would be >$100.

## 2018-03-02 ENCOUNTER — Telehealth: Payer: Self-pay | Admitting: Hematology and Oncology

## 2018-03-02 ENCOUNTER — Inpatient Hospital Stay (HOSPITAL_BASED_OUTPATIENT_CLINIC_OR_DEPARTMENT_OTHER): Payer: BLUE CROSS/BLUE SHIELD | Admitting: Hematology and Oncology

## 2018-03-02 ENCOUNTER — Encounter: Payer: Self-pay | Admitting: Hematology and Oncology

## 2018-03-02 ENCOUNTER — Inpatient Hospital Stay: Payer: BLUE CROSS/BLUE SHIELD | Attending: Hematology and Oncology

## 2018-03-02 ENCOUNTER — Telehealth: Payer: Self-pay

## 2018-03-02 VITALS — BP 134/87 | HR 86 | Temp 97.8°F | Resp 18 | Ht 64.0 in | Wt 179.9 lb

## 2018-03-02 DIAGNOSIS — Z79899 Other long term (current) drug therapy: Secondary | ICD-10-CM

## 2018-03-02 DIAGNOSIS — D61818 Other pancytopenia: Secondary | ICD-10-CM | POA: Insufficient documentation

## 2018-03-02 DIAGNOSIS — C562 Malignant neoplasm of left ovary: Secondary | ICD-10-CM | POA: Diagnosis not present

## 2018-03-02 DIAGNOSIS — Z1501 Genetic susceptibility to malignant neoplasm of breast: Secondary | ICD-10-CM

## 2018-03-02 DIAGNOSIS — Z1509 Genetic susceptibility to other malignant neoplasm: Secondary | ICD-10-CM

## 2018-03-02 DIAGNOSIS — K439 Ventral hernia without obstruction or gangrene: Secondary | ICD-10-CM | POA: Insufficient documentation

## 2018-03-02 DIAGNOSIS — Z1589 Genetic susceptibility to other disease: Secondary | ICD-10-CM

## 2018-03-02 DIAGNOSIS — Z9221 Personal history of antineoplastic chemotherapy: Secondary | ICD-10-CM | POA: Insufficient documentation

## 2018-03-02 LAB — COMPREHENSIVE METABOLIC PANEL
ALBUMIN: 4.1 g/dL (ref 3.5–5.0)
ALK PHOS: 69 U/L (ref 40–150)
ALT: 15 U/L (ref 0–55)
AST: 15 U/L (ref 5–34)
Anion gap: 11 (ref 3–11)
BUN: 11 mg/dL (ref 7–26)
CALCIUM: 10.9 mg/dL — AB (ref 8.4–10.4)
CO2: 27 mmol/L (ref 22–29)
CREATININE: 0.87 mg/dL (ref 0.60–1.10)
Chloride: 103 mmol/L (ref 98–109)
GFR calc non Af Amer: >60 mL/min (ref >60)
GLUCOSE: 102 mg/dL (ref 70–140)
Potassium: 3.9 mmol/L (ref 3.5–5.1)
SODIUM: 141 mmol/L (ref 136–145)
Total Bilirubin: 0.4 mg/dL (ref 0.2–1.2)
Total Protein: 7.3 g/dL (ref 6.4–8.3)

## 2018-03-02 LAB — CBC WITH DIFFERENTIAL/PLATELET
BASOS PCT: 1 %
Basophils Absolute: 0 10*3/uL (ref 0.0–0.1)
EOS ABS: 0.1 10*3/uL (ref 0.0–0.5)
EOS PCT: 2 %
HCT: 32.6 % — ABNORMAL LOW (ref 34.8–46.6)
Hemoglobin: 10.7 g/dL — ABNORMAL LOW (ref 11.6–15.9)
Lymphocytes Relative: 28 %
Lymphs Abs: 1.2 10*3/uL (ref 0.9–3.3)
MCH: 32.9 pg (ref 25.1–34.0)
MCHC: 32.8 g/dL (ref 31.5–36.0)
MCV: 100.3 fL (ref 79.5–101.0)
MONO ABS: 0.4 10*3/uL (ref 0.1–0.9)
Monocytes Relative: 9 %
Neutro Abs: 2.5 10*3/uL (ref 1.5–6.5)
Neutrophils Relative %: 60 %
Platelets: 524 10*3/uL — ABNORMAL HIGH (ref 145–400)
RBC: 3.25 MIL/uL — ABNORMAL LOW (ref 3.70–5.45)
RDW: 16.4 % — AB (ref 11.2–14.5)
WBC: 4.1 10*3/uL (ref 3.9–10.3)

## 2018-03-02 MED ORDER — ANASTROZOLE 1 MG PO TABS: 1.0000 mg | ORAL_TABLET | Freq: Every day | ORAL | 9 refills | Status: DC

## 2018-03-02 NOTE — Telephone Encounter (Signed)
Gave avs and calendar ° °

## 2018-03-02 NOTE — Assessment & Plan Note (Signed)
We discussed chemoprevention with tamoxifen, AI versus mastectomy  We discussed the importance of surveillance imaging with mammogram alternate with MRI After extensive discussion, she is in agreement to try aromatase inhibitor I plan to see her back in May for toxicity review

## 2018-03-02 NOTE — Assessment & Plan Note (Signed)
She has completed adjuvant treatment I will schedule follow-up appointment for her to see GYN oncologist

## 2018-03-02 NOTE — Assessment & Plan Note (Signed)
She has mild persistent pancytopenia since discontinuation of chemotherapy She is not symptomatic Observe

## 2018-03-02 NOTE — Progress Notes (Signed)
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  Pancytopenia, acquired [facility]) She has mild persistent pancytopenia since discontinuation of chemotherapy She is not symptomatic Observe    Orders Placed This Encounter  Procedures  . IR Removal Tun Access W/ Port W/O FL    Standing Status:   Future    Standing Expiration Date:   05/03/2019    Order Specific Question:   Reason for exam:    Answer:   no need port    Order Specific Question:   Is the patient pregnant?    Answer:   No    Order Specific Question:   Preferred Imaging Location?    Answer:   [facility]  . MR ABDOMEN MRCP W WO CONTAST    Standing Status:   Future    Standing Expiration Date:   05/03/2019    Order Specific Question:   If indicated for the ordered procedure, I authorize the administration of contrast media per Radiology protocol    Answer:   Yes    Order Specific Question:   What is the patient's sedation requirement?    Answer:   No Sedation    Order Specific Question:   Does the patient have a pacemaker or implanted devices?    Answer:   No    Order Specific Question:   Radiology Contrast Protocol - do NOT remove file path    Answer:    [email or web address]    Order Specific Question:   Preferred imaging location?    Answer:   [facility] (table limit-350 lbs)  . Ambulatory referral to Gastroenterology    Referral Priority:   Routine    Referral Type:   Consultation    Referral Reason:   Specialty Services Required    Referred to Provider:   Milus Banister, MD    Number of Visits Requested:   1    INTERVAL HISTORY: Please see below for problem oriented charting. She returns for further follow-up Since completion of chemotherapy, she has been feeling well She denies recent infection No residual peripheral neuropathy She continues to have mild fatigue but felt ready to return back to work on March 09, 2018  SUMMARY OF ONCOLOGIC HISTORY: Oncology History   Genetic testing came back positive for ATM variant     Left ovarian epithelial cancer ([place])   08/19/2017 Imaging    She had outside CT scan which showed large abdominal mass      08/27/2017 Tumor Marker    Patient's tumor was tested for the following markers: CA-125 Results of the tumor marker test revealed 904.5      09/11/2017 Pathology Results    1. Ovary and fallopian tube, left ENDOMETRIOID CARCINOMA WITH SQUAMOUS MORULES, FIGO GRADE 1 (20.0 CM) TUMOR IS LIMITED TO  LEFT OVARY WITH SURFACE INVOLVEMENT (PT1C2) FALLOPIAN TUBE: FOCAL SURFACE WITH CHRONIC INFLAMMATION 2. Soft tissue, biopsy, right medial thigh MATURE LIPOMA 3. Ovary and fallopian tube, right ENDOMETRIOMA AND SIMPLE SEROUS CYST WITH FALLOPIAN TUBE ADHESION NEGATIVE FOR MALIGNANCY 4. Omentum, resection for tumor BENIGN OMENTUM NEGATIVE FOR CARCINOMA 5. Lymph nodes, regional resection, right pelvic EIGHT BENIGN LYMPH NODES (0/8) 6. Lymph nodes, regional resection, left pelvic SEVEN BENIGN LYMPH NODES (0/7) 7. Lymph node, biopsy, right para-aortic FOUR BENIGN LYMPH NODES (0/4) 8. Lymph node, biopsy, left para-aortic ONE BENIGN LYMPH NODE (0/1) 9.  Peritoneum, biopsy, left abdominal BENIGN FIBROMUSCULAR TISSUE 10. Peritoneum, biopsy, right abdominal BENIGN FIBROMUSCULAR TISSUE WITH SEROSITIS 11. Peritoneum, biopsy, pelvic FIBROADIPOSE TISSUE WITH SEROSITIS  Specimen(s): Ovary and fallopian tube Procedure: (including lymph node sampling): salpingo-oophorectomy Primary tumor site (including laterality): Left ovary Ovarian surface involvement: Yes Ovarian capsule intact without fragmentation: intact Maximum tumor size (cm): 20.0 cm Histologic type: Endometrioid carcinoma Grade: 1 Peritoneal implants: (specify invasive or non-invasive): Negative Pelvic extension (list additional structures on separate lines and if involved): Negative Lymph nodes: number examined 20 ; number positive 0 TNM code: pT1c2, pN0, pMx FIGO Stage (based on pathologic findings, needs clinical correlation): IC2      09/11/2017 Surgery    Preoperative Diagnosis: 1. left adnexal mass.  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   Procedure(s) Performed: 1. Exploratory laparotomy with bilateral salpingo-oophorectomy, pelvic and para-aortic lymph node dissection, omentectomy and radical debulking for ovarian cancer (CPT [phone])  Surgeon: Everitt Amber, M.D. Operative Findings:20 cm left ovarian mass.   No intraperitoneal rupture occurred;  Frozen pathology was consistent with adenocarcinoma; right tube and ovary normal in appearance; 3) normal bilateral pelvic and para-aortic lymph nodes and omentum; small and large bowel to palpation. This represented an optimal cytoreduction with no gross visible disease remaining.       10/09/2017 Tumor Marker    Patient's tumor was tested for the following markers: CA-125 Results of the tumor marker test revealed 68.7      10/15/2017 Adverse Reaction    She developed reaction to Paclitaxel.      10/15/2017 -  Chemotherapy    She received carboplatin. Due to infusion reaction to  Taxol with cycle 1, treatment was switched to carboplatin and Abraxane      11/03/2017 Procedure    Successful placement of a right internal jugular approach power injectable Port-A-Cath. The catheter is ready for immediate use.      11/27/2017 Tumor Marker    Patient's tumor was tested for the following markers: CA-125 Results of the tumor marker test revealed 27.7      12/12/2017 Genetic Testing    The patient had genetic testing due to a personal history of ovarian cancer and a family history of pancreatic cancer (and possibly breast cancer).  The [facility] Hereditary Cancer Panel + tumor HRD analysis was ordered from the laboratory [facility]. The [facility] gene panel offered by [employer] includes sequencing and deletion/duplication testing of the following 29 genes: APC, ATM, BARD1, BMPR1A, BRCA1, BRCA2, BRIP1, CHD1, CDK4, CDKN2A, CHEK2, EPCAM (large rearrangement only), HOXB13, MLH1, MSH2, MSH6, MUTYH, NBN, PALB2, PMS2, PTEN, RAD51C, RAD51D, SMAD4, STK11, and TP53. Sequencing was performed for select regions of POLE and POLD1, and large rearrangement analysis was performed for select regions of GREM1.  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 [id number] (p.[id number]).  The date of this test report is 12/12/2017.      01/08/2018 Tumor  Marker    Patient's tumor was tested for the following markers: CA-125 Results of the tumor marker test revealed 17.5       REVIEW OF SYSTEMS:   Constitutional: Denies fevers, chills or abnormal weight loss Eyes: Denies blurriness of vision Ears, nose, mouth, throat, and face: Denies mucositis or sore throat Respiratory: Denies cough, dyspnea or wheezes Cardiovascular: Denies palpitation, chest discomfort or lower extremity swelling Gastrointestinal:  Denies nausea, heartburn or change in bowel habits Skin: Denies abnormal skin rashes Lymphatics: Denies new lymphadenopathy or easy bruising Neurological:Denies  numbness, tingling or new weaknesses Behavioral/Psych: Mood is stable, no new changes  All other systems were reviewed with the patient and are negative.  I have reviewed the past medical history, past surgical history, social history and family history with the patient and they are unchanged from previous note.  ALLERGIES:  is allergic to bee venom; chlorhexidine; paclitaxel; other; bacitracin; and iodine.  MEDICATIONS:  Current Outpatient Medications  Medication Sig Dispense Refill  . acetaminophen (TYLENOL) 325 MG tablet Take 650 mg 2 (two) times daily as needed by mouth for headache.    Marland Kitchen amLODipine (NORVASC) 5 MG tablet Take 5 mg by mouth every evening.    Marland Kitchen anastrozole (ARIMIDEX) 1 MG tablet Take 1 tablet (1 mg total) by mouth daily. 30 tablet 9  . Ascorbic Acid (VITAMIN C) 1000 MG tablet Take 1,000 mg by mouth every morning.     . lidocaine-prilocaine (EMLA) cream Apply 1 application topically as needed. 30 g 6  . Liniments (SALONPAS PAIN RELIEF PATCH EX) Place 1 patch onto the skin daily as needed (knee pain).    Marland Kitchen loratadine (CLARITIN) 10 MG tablet Take 10 mg by mouth every morning.     . Melatonin 1 MG CAPS Take 1 mg by mouth at bedtime.    . Menthol, Topical Analgesic, 4 % GEL Apply 1 application daily as needed topically (headache).    . Methylsulfonylmethane (MSM) 1000 MG CAPS Take 1,000 mg by mouth every morning.     . Multiple Vitamins-Minerals (HAIR/SKIN/NAILS) TABS Take 2 tablets by mouth every morning.    Marland Kitchen UNABLE TO FIND CPAP     No current facility-administered medications for this visit.     PHYSICAL EXAMINATION: ECOG PERFORMANCE STATUS: 1 - Symptomatic but completely ambulatory  Vitals:   03/02/18 1001  BP: 134/87  Pulse: 86  Resp: 18  Temp: 97.8 F (36.6 C)  SpO2: 100%   Filed Weights   03/02/18 1001  Weight: 179 lb 14.4 oz (81.6 kg)    GENERAL:alert, no distress and comfortable SKIN: skin color, texture, turgor are normal, no rashes or significant  lesions EYES: normal, Conjunctiva are pink and non-injected, sclera clear OROPHARYNX:no exudate, no erythema and lips, buccal mucosa, and tongue normal  NECK: supple, thyroid normal size, non-tender, without nodularity LYMPH:  no palpable lymphadenopathy in the cervical, axillary or inguinal LUNGS: clear to auscultation and percussion with normal breathing effort HEART: regular rate & rhythm and no murmurs and no lower extremity edema ABDOMEN:abdomen soft, non-tender and normal bowel sounds.  Well-healed surgical scar noted.  Large abdominal wall hernia noted  musculoskeletal:no cyanosis of digits and no clubbing  NEURO: alert & oriented x 3 with fluent speech, no focal motor/sensory deficits  LABORATORY DATA:  I have reviewed the data as listed    Component Value Date/Time   NA 141 03/02/2018 0943   NA 141 11/27/2017 0818   K 3.9 03/02/2018 0943   K 3.8 11/27/2017 0818  CL 103 03/02/2018 0943   CO2 27 03/02/2018 0943   CO2 25 11/27/2017 0818   GLUCOSE 102 03/02/2018 0943   GLUCOSE 90 11/27/2017 0818   BUN 11 03/02/2018 0943   BUN 12.9 11/27/2017 0818   CREATININE 0.87 03/02/2018 0943   CREATININE 0.8 11/27/2017 0818   CALCIUM 10.9 (H) 03/02/2018 0943   CALCIUM 9.9 11/27/2017 0818   PROT 7.3 03/02/2018 0943   PROT 7.0 11/27/2017 0818   ALBUMIN 4.1 03/02/2018 0943   ALBUMIN 3.8 11/27/2017 0818   AST 15 03/02/2018 0943   AST 11 11/27/2017 0818   ALT 15 03/02/2018 0943   ALT 11 11/27/2017 0818   ALKPHOS 69 03/02/2018 0943   ALKPHOS 68 11/27/2017 0818   BILITOT 0.4 03/02/2018 0943   BILITOT 0.41 11/27/2017 0818   GFRNONAA >60 03/02/2018 0943   GFRAA >60 03/02/2018 0943    No results found for: SPEP, UPEP  Lab Results  Component Value Date   WBC 4.1 03/02/2018   NEUTROABS 2.5 03/02/2018   HGB 10.7 (L) 03/02/2018   HCT 32.6 (L) 03/02/2018   MCV 100.3 03/02/2018   PLT 524 (H) 03/02/2018      Chemistry      Component Value Date/Time   NA 141 03/02/2018 0943   NA  141 11/27/2017 0818   K 3.9 03/02/2018 0943   K 3.8 11/27/2017 0818   CL 103 03/02/2018 0943   CO2 27 03/02/2018 0943   CO2 25 11/27/2017 0818   BUN 11 03/02/2018 0943   BUN 12.9 11/27/2017 0818   CREATININE 0.87 03/02/2018 0943   CREATININE 0.8 11/27/2017 0818      Component Value Date/Time   CALCIUM 10.9 (H) 03/02/2018 0943   CALCIUM 9.9 11/27/2017 0818   ALKPHOS 69 03/02/2018 0943   ALKPHOS 68 11/27/2017 0818   AST 15 03/02/2018 0943   AST 11 11/27/2017 0818   ALT 15 03/02/2018 0943   ALT 11 11/27/2017 0818   BILITOT 0.4 03/02/2018 0943   BILITOT 0.41 11/27/2017 0818      All questions were answered. The patient knows to call the clinic with any problems, questions or concerns. No barriers to learning was detected.  I spent 20 minutes counseling the patient face to face. The total time spent in the appointment was 30 minutes and more than 50% was on counseling and review of test results  Heath Lark, MD 03/02/2018 1:22 PM

## 2018-03-02 NOTE — Assessment & Plan Note (Signed)
The patient is at risk of pancreatic cancer I will get GI referral and appropriate clinical studies as indicated for this.

## 2018-03-03 ENCOUNTER — Telehealth: Payer: Self-pay | Admitting: *Deleted

## 2018-03-03 LAB — CA 125: Cancer Antigen (CA) 125: 11.4 U/mL (ref 0.0–38.1)

## 2018-03-03 NOTE — Telephone Encounter (Signed)
Notified of message below

## 2018-03-03 NOTE — Telephone Encounter (Signed)
LM to call Dr Gorsuch's nurse 

## 2018-03-03 NOTE — Telephone Encounter (Signed)
-----   Message from Heath Lark, MD sent at 03/03/2018  7:43 AM EDT ----- Regarding: labs pls let her know tumor marker is normal Also, I have requested MRCP and GI consult for follow-up on the pancreas as discussed ----- Message ----- From: Interface, Lab In [place] Sent: 03/02/2018  10:01 AM To: Heath Lark, MD

## 2018-03-04 ENCOUNTER — Telehealth: Payer: Self-pay | Admitting: *Deleted

## 2018-03-04 NOTE — Telephone Encounter (Signed)
Called and scheduled the patient to see Dr. Denman George.

## 2018-03-14 ENCOUNTER — Other Ambulatory Visit: Payer: Self-pay | Admitting: Hematology and Oncology

## 2018-03-14 ENCOUNTER — Ambulatory Visit (HOSPITAL_COMMUNITY)
Admission: RE | Admit: 2018-03-14 | Discharge: 2018-03-14 | Disposition: A | Discharge status: Home / Self Care | Payer: BLUE CROSS/BLUE SHIELD | Source: Ambulatory Visit | Attending: Hematology and Oncology | Admitting: Hematology and Oncology

## 2018-03-14 DIAGNOSIS — C562 Malignant neoplasm of left ovary: Secondary | ICD-10-CM

## 2018-03-14 DIAGNOSIS — Z1501 Genetic susceptibility to malignant neoplasm of breast: Secondary | ICD-10-CM

## 2018-03-14 DIAGNOSIS — K8020 Calculus of gallbladder without cholecystitis without obstruction: Secondary | ICD-10-CM | POA: Diagnosis not present

## 2018-03-14 DIAGNOSIS — Z1509 Genetic susceptibility to other malignant neoplasm: Secondary | ICD-10-CM

## 2018-03-14 DIAGNOSIS — Z1589 Genetic susceptibility to other disease: Secondary | ICD-10-CM

## 2018-03-14 DIAGNOSIS — N281 Cyst of kidney, acquired: Secondary | ICD-10-CM | POA: Insufficient documentation

## 2018-03-14 MED ORDER — GADOBENATE DIMEGLUMINE 529 MG/ML IV SOLN
20.0000 mL | Freq: Once | INTRAVENOUS | Status: AC | PRN
  Administered 2018-03-14: 20 mL via INTRAVENOUS

## 2018-03-16 ENCOUNTER — Telehealth: Payer: Self-pay | Admitting: *Deleted

## 2018-03-16 ENCOUNTER — Other Ambulatory Visit: Payer: Self-pay | Admitting: Radiology

## 2018-03-16 ENCOUNTER — Encounter: Payer: Self-pay | Admitting: Gastroenterology

## 2018-03-16 NOTE — Telephone Encounter (Signed)
Referral called to [facility] Dr Ardis Hughs

## 2018-03-16 NOTE — Telephone Encounter (Signed)
-----   Message from Heath Lark, MD sent at 03/16/2018  7:23 AM EDT ----- Regarding: neg MRI PLs let her know MRI is negative for pancreas lesion Please follow on GI consult referral

## 2018-03-17 ENCOUNTER — Encounter (HOSPITAL_COMMUNITY): Payer: Self-pay

## 2018-03-17 ENCOUNTER — Ambulatory Visit (HOSPITAL_COMMUNITY)
Admission: RE | Admit: 2018-03-17 | Discharge: 2018-03-17 | Disposition: A | Discharge status: Home / Self Care | Payer: BLUE CROSS/BLUE SHIELD | Source: Ambulatory Visit | Attending: Hematology and Oncology | Admitting: Hematology and Oncology

## 2018-03-17 DIAGNOSIS — Z452 Encounter for adjustment and management of vascular access device: Secondary | ICD-10-CM | POA: Insufficient documentation

## 2018-03-17 DIAGNOSIS — C562 Malignant neoplasm of left ovary: Secondary | ICD-10-CM

## 2018-03-17 DIAGNOSIS — Z8543 Personal history of malignant neoplasm of ovary: Secondary | ICD-10-CM | POA: Diagnosis not present

## 2018-03-17 HISTORY — PX: IR REMOVAL TUN ACCESS W/ PORT W/O FL MOD SED: IMG2290

## 2018-03-17 LAB — CBC
HEMATOCRIT: 35.1 % — AB (ref 36.0–46.0)
HEMOGLOBIN: 11.5 g/dL — AB (ref 12.0–15.0)
MCH: 32.2 pg (ref 26.0–34.0)
MCHC: 32.8 g/dL (ref 30.0–36.0)
MCV: 98.3 fL (ref 78.0–100.0)
Platelets: 290 10*3/uL (ref 150–400)
RBC: 3.57 MIL/uL — ABNORMAL LOW (ref 3.87–5.11)
RDW: 15 % (ref 11.5–15.5)
WBC: 6.2 10*3/uL (ref 4.0–10.5)

## 2018-03-17 LAB — PROTIME-INR
INR: 0.9
Prothrombin Time: 12.1 seconds (ref 11.4–15.2)

## 2018-03-17 MED ORDER — CEFAZOLIN SODIUM-DEXTROSE 2-4 GM/100ML-% IV SOLN: 2.0000 g | Freq: Once | INTRAVENOUS | Status: DC

## 2018-03-17 MED ORDER — LIDOCAINE HCL 1 % IJ SOLN
INTRAMUSCULAR | Status: AC
  Filled 2018-03-17: qty 20

## 2018-03-17 MED ORDER — LIDOCAINE HCL 1 % IJ SOLN
INTRAMUSCULAR | Status: AC | PRN
  Administered 2018-03-17: 5 mL via INTRADERMAL

## 2018-03-17 MED ORDER — SODIUM CHLORIDE 0.9 % IV SOLN
INTRAVENOUS | Status: DC
  Administered 2018-03-17: 13:00:00 via INTRAVENOUS

## 2018-03-17 MED ORDER — CEFAZOLIN SODIUM-DEXTROSE 2-4 GM/100ML-% IV SOLN
INTRAVENOUS | Status: AC
  Filled 2018-03-17: qty 100

## 2018-03-17 NOTE — Procedures (Signed)
Interventional Radiology Procedure Note  Procedure: Portacatheter removal  Complications: None  Estimated Blood Loss: None  Recommendations: - DC home   Signed,  Heath K. McCullough, MD   

## 2018-03-17 NOTE — Discharge Instructions (Signed)
Implanted Port Removal, Care After Refer to this sheet in the next few weeks. These instructions provide you with information about caring for yourself after your procedure. Your health care provider may also give you more specific instructions. Your treatment has been planned according to current medical practices, but problems sometimes occur. Call your health care provider if you have any problems or questions after your procedure. What can I expect after the procedure? After the procedure, it is common to have:  Soreness or pain near your incision.  Some swelling or bruising near your incision.  Follow these instructions at home: Medicines  Take over-the-counter and prescription medicines only as told by your health care provider.  If you were prescribed an antibiotic medicine, take it as told by your health care provider. Do not stop taking the antibiotic even if you start to feel better. Bathing  Do not take baths, swim, or use a hot tub until your health care provider approves. Ask your health care provider if you can take showers. You may only be allowed to take sponge baths for bathing.  You may shower tomorrow. Incision care  Follow instructions from your health care provider about how to take care of your incision. Make sure you: ? Wash your hands with soap and water before you change your bandage (dressing). If soap and water are not available, use hand sanitizer. ? Change your dressing as told by your health care provider.  You may remove your dressing tomorrow. ? Keep your dressing dry. ? Leave skin glue in place. These skin closures may need to stay in place for 2 weeks or longer. If adhesive strip edges start to loosen and curl up, you may trim the loose edges. Do not remove adhesive strips completely unless your health care provider tells you to do that.  Check your incision area every day for signs of infection. Check for: ? More redness, swelling, or pain. ? More fluid  or blood. ? Warmth. ? Pus or a bad smell. Driving  If you received a sedative, do not drive for 24 hours after the procedure.  If you did not receive a sedative, ask your health care provider when it is safe to drive. Activity  Return to your normal activities as told by your health care provider. Ask your health care provider what activities are safe for you.  Until your health care provider says it is safe: ? Do not lift anything that is heavier than 10 lb (4.5 kg). ? Do not do activities that involve lifting your arms over your head. General instructions  Do not use any tobacco products, such as cigarettes, chewing tobacco, and e-cigarettes. Tobacco can delay healing. If you need help quitting, ask your health care provider.  Keep all follow-up visits as told by your health care provider. This is important. Contact a health care provider if:  You have more redness, swelling, or pain around your incision.  You have more fluid or blood coming from your incision.  Your incision feels warm to the touch.  You have pus or a bad smell coming from your incision.  You have a fever.  You have pain that is not relieved by your pain medicine. Get help right away if:  You have chest pain.  You have difficulty breathing. This information is not intended to replace advice given to you by your health care provider. Make sure you discuss any questions you have with your health care provider. Document Released: 11/06/2015 Document Revised:  05/02/2016 Document Reviewed: 08/30/2015 Elsevier Interactive Patient Education  [employer].

## 2018-03-17 NOTE — Progress Notes (Signed)
Patient ID: Sierra Gray, female   DOB: [birth year], 57 y.o.   MRN: [id number] Patient presents today for Port-A-Cath removal.  She has a history of ovarian cancer and has completed treatment.  Details/risks of port removal, including but not limited to, internal bleeding, infection, discussed with patient with her understanding and consent.  She request no IV conscious sedation for the procedure.

## 2018-03-28 IMAGING — MG 2D DIGITAL SCREENING BILATERAL MAMMOGRAM WITH 3D TOMO WITH CAD
3 series · 3 of 3 positions shown · non-contrast
Comparison: Previous exam(s).

CLINICAL DATA: Screening.

EXAM:
2D DIGITAL SCREENING BILATERAL MAMMOGRAM WITH 3D TOMO WITH CAD

[R CC]
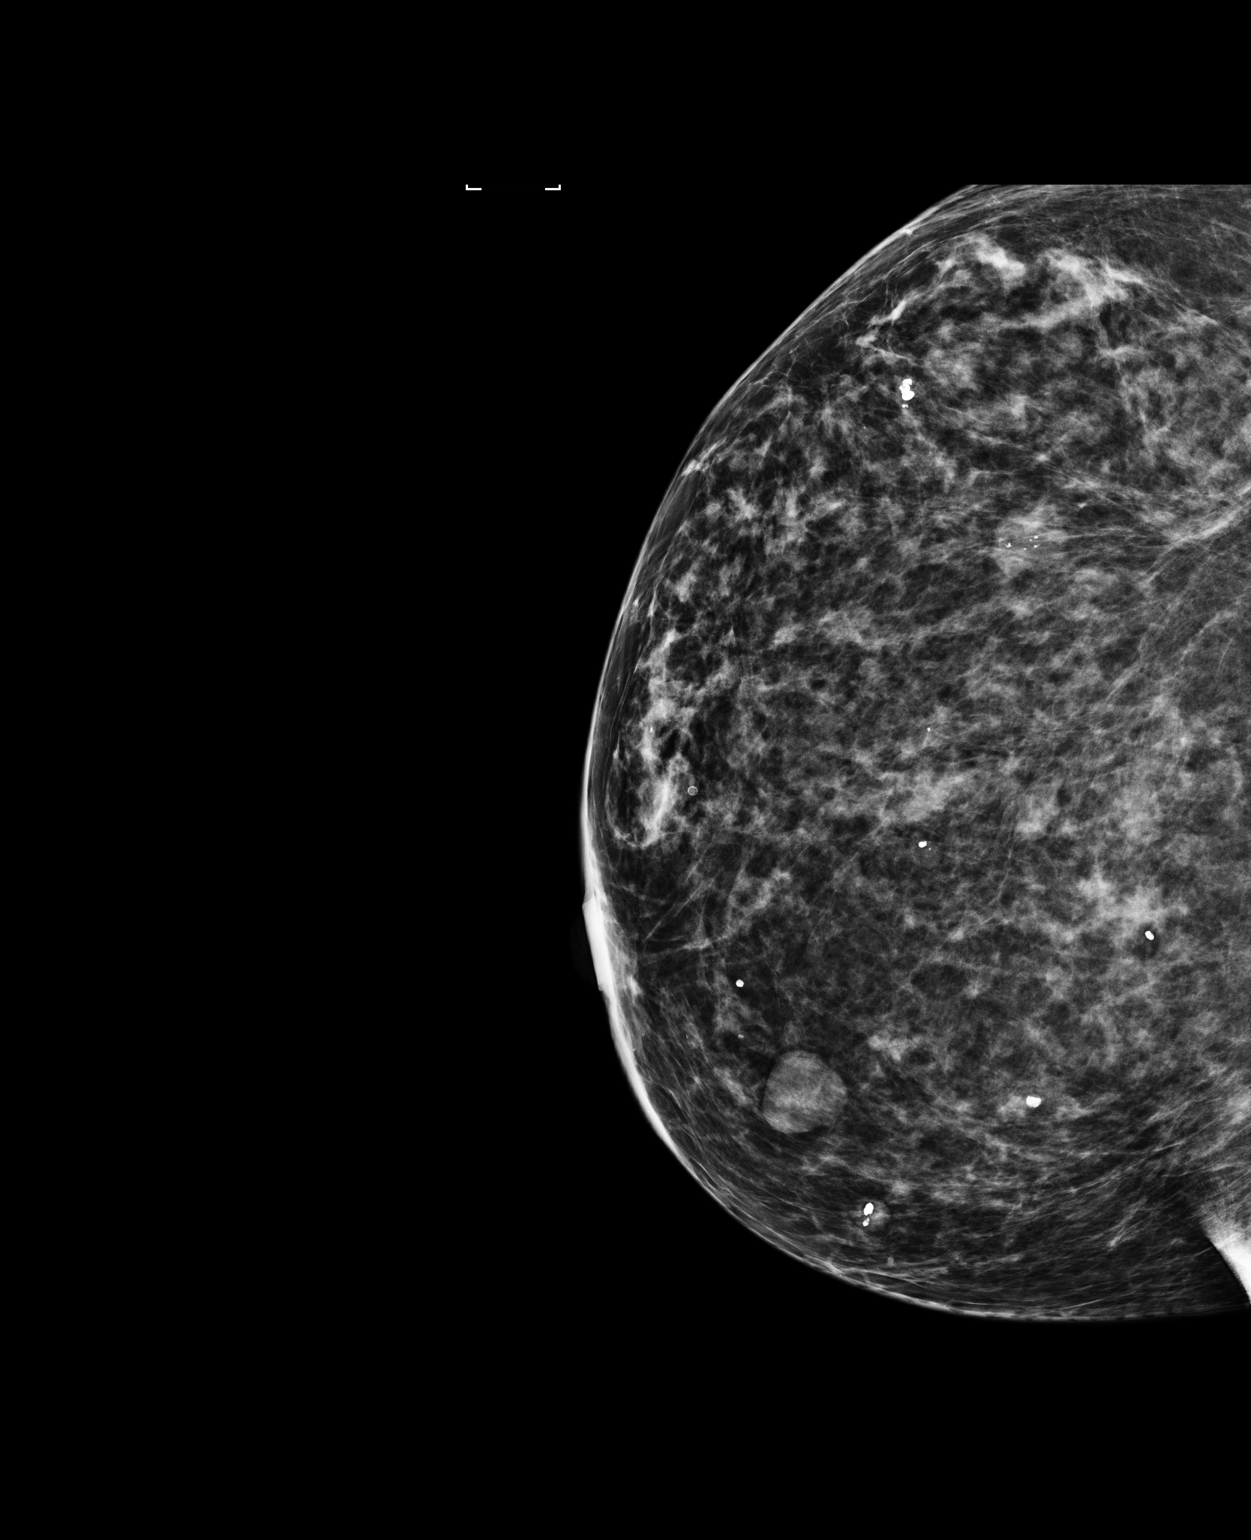

[L MLO]
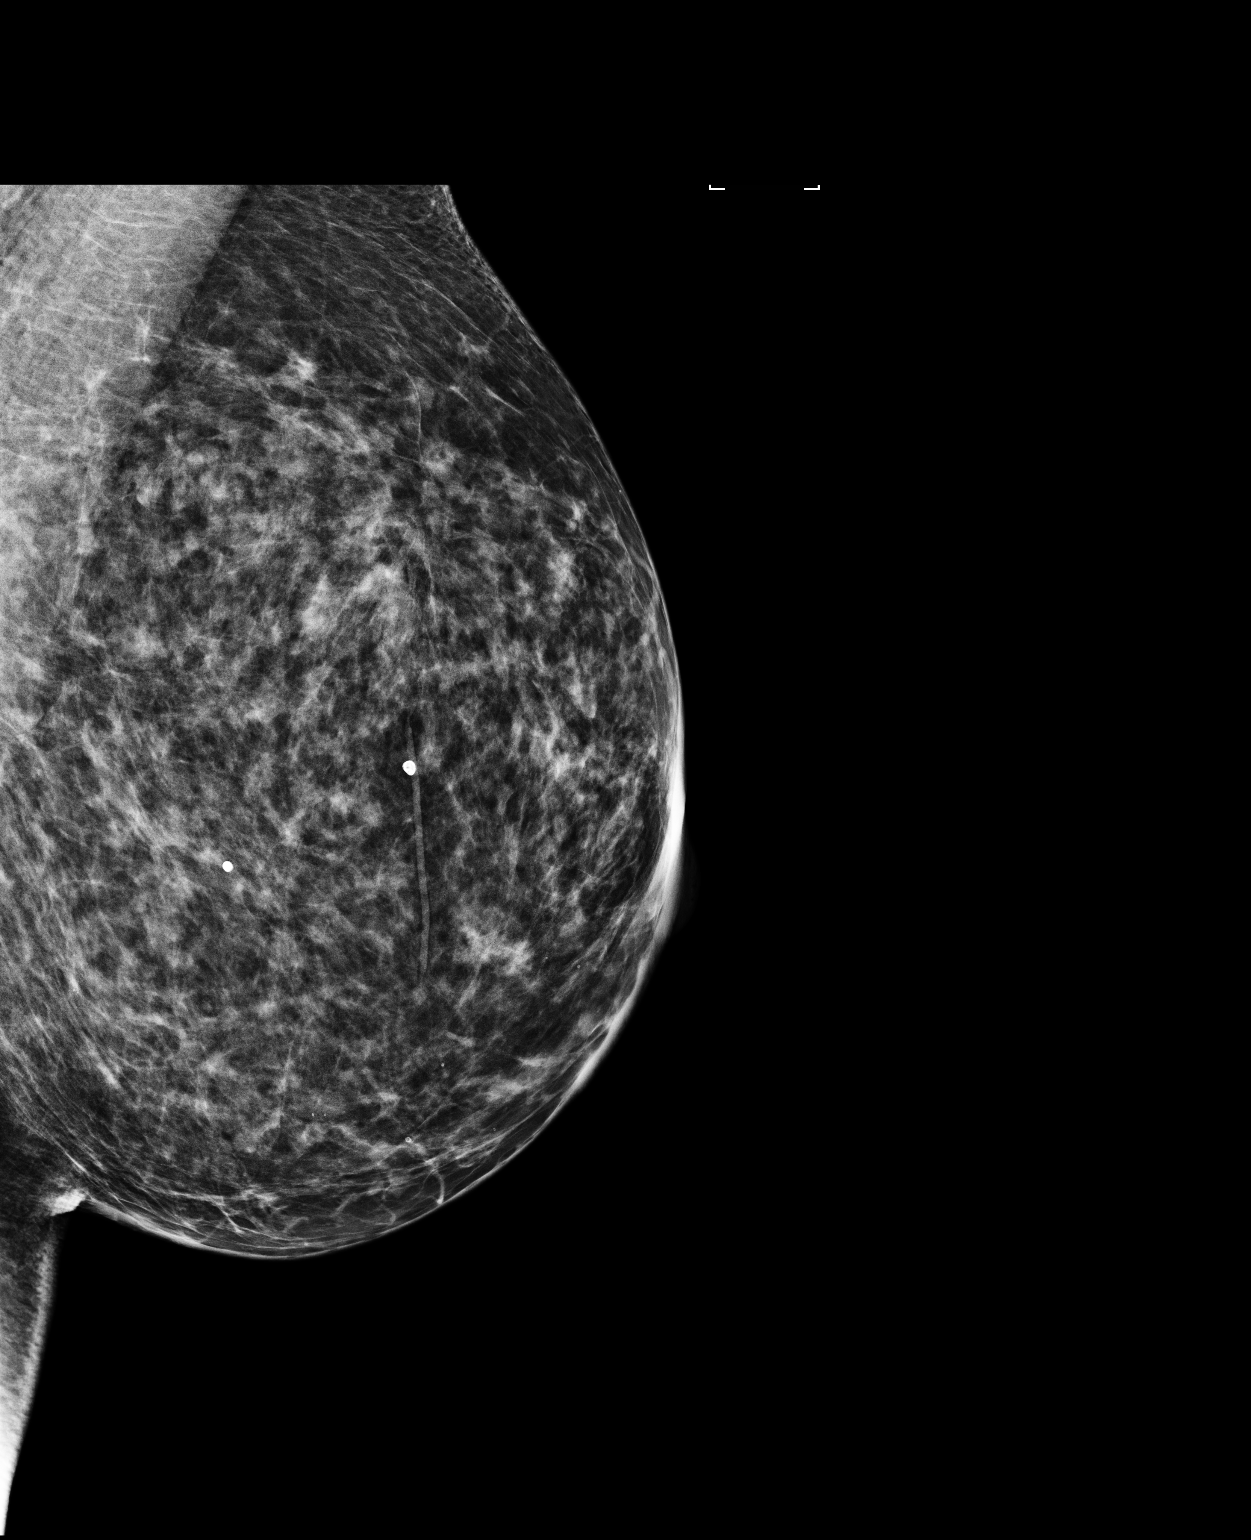

[R CC synth-2D]
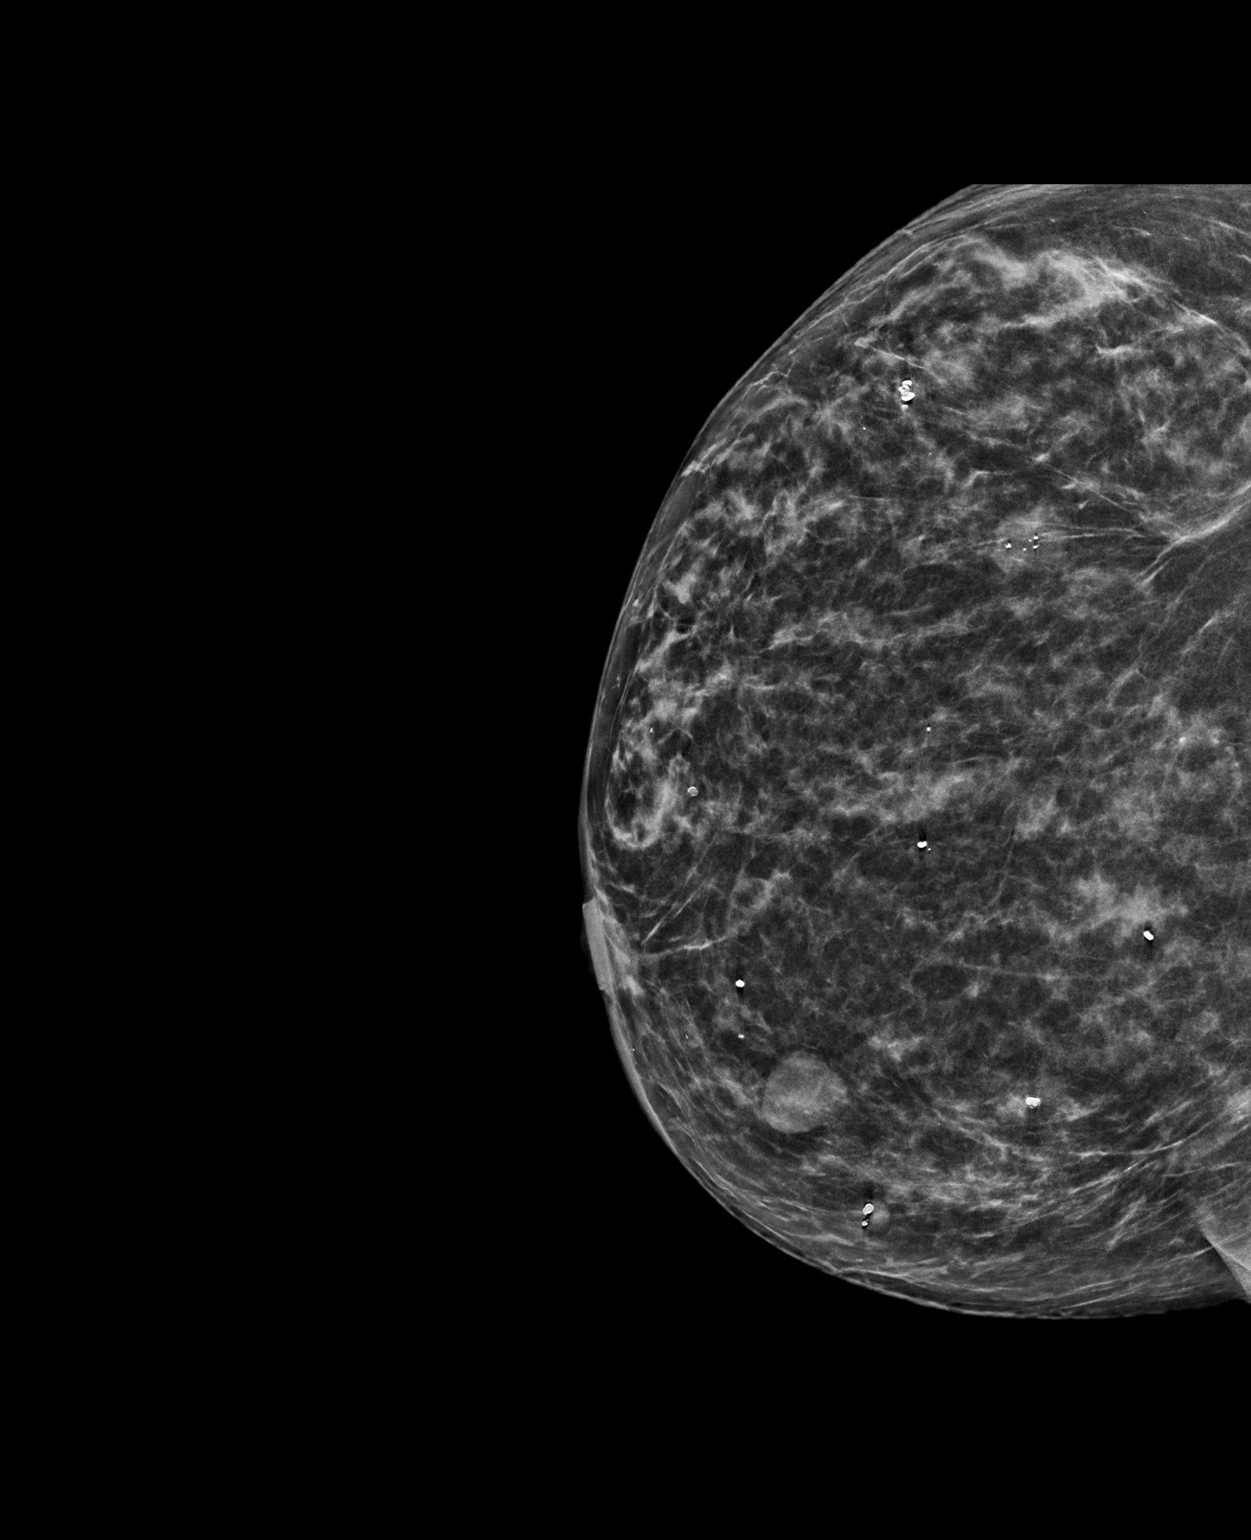

[3 of 3 positions shown; findings below may reference images not displayed]

ACR Breast Density Category c: The breast tissue is heterogeneously
dense, which may obscure small masses.
FINDINGS: There are no findings suspicious for malignancy. Images were
processed with CAD.
IMPRESSION: No mammographic evidence of malignancy. A result letter of this
screening mammogram will be mailed directly to the patient.

RECOMMENDATION:
Screening mammogram in one year. (Code:UA-9-KQN)

BI-RADS CATEGORY  1: Negative.

## 2018-04-10 ENCOUNTER — Ambulatory Visit: Payer: BLUE CROSS/BLUE SHIELD | Admitting: Gynecologic Oncology

## 2018-04-10 ENCOUNTER — Ambulatory Visit: Payer: BLUE CROSS/BLUE SHIELD | Admitting: Hematology and Oncology

## 2018-04-20 ENCOUNTER — Telehealth: Payer: Self-pay | Admitting: Hematology and Oncology

## 2018-04-20 ENCOUNTER — Inpatient Hospital Stay: Payer: BLUE CROSS/BLUE SHIELD | Attending: Hematology and Oncology | Admitting: Hematology and Oncology

## 2018-04-20 ENCOUNTER — Encounter: Payer: Self-pay | Admitting: Hematology and Oncology

## 2018-04-20 VITALS — BP 110/78 | HR 92 | Temp 99.1°F | Resp 18 | Ht 64.0 in | Wt 186.6 lb

## 2018-04-20 DIAGNOSIS — Z79811 Long term (current) use of aromatase inhibitors: Secondary | ICD-10-CM | POA: Diagnosis not present

## 2018-04-20 DIAGNOSIS — Z1501 Genetic susceptibility to malignant neoplasm of breast: Secondary | ICD-10-CM | POA: Diagnosis not present

## 2018-04-20 DIAGNOSIS — C562 Malignant neoplasm of left ovary: Secondary | ICD-10-CM | POA: Insufficient documentation

## 2018-04-20 DIAGNOSIS — Z1509 Genetic susceptibility to other malignant neoplasm: Secondary | ICD-10-CM | POA: Diagnosis not present

## 2018-04-20 DIAGNOSIS — Z1231 Encounter for screening mammogram for malignant neoplasm of breast: Secondary | ICD-10-CM

## 2018-04-20 DIAGNOSIS — Z9221 Personal history of antineoplastic chemotherapy: Secondary | ICD-10-CM | POA: Diagnosis not present

## 2018-04-20 DIAGNOSIS — Z1589 Genetic susceptibility to other disease: Secondary | ICD-10-CM

## 2018-04-20 MED ORDER — ANASTROZOLE 1 MG PO TABS: 1.0000 mg | ORAL_TABLET | Freq: Every day | ORAL | 9 refills | Status: DC

## 2018-04-20 NOTE — Assessment & Plan Note (Signed)
Breast examination is benign The patient has made informed decision not to pursue bilateral mastectomy I will order surveillance MRI imaging in July So far, she tolerated aromatase inhibitor well except for mild arthralgia

## 2018-04-20 NOTE — Progress Notes (Signed)
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 MR Breast Bilateral W Contrast    Standing Status:   Future    Standing Expiration Date:   04/20/2019    Order Specific Question:   If indicated for the ordered procedure, I authorize the administration of contrast media per Radiology protocol    Answer:   Yes    Order Specific Question:   What is the patient's sedation requirement?    Answer:   No Sedation    Order Specific Question:   Does the patient have a pacemaker or implanted devices?    Answer:   No    Order Specific Question:   Radiology Contrast Protocol - do NOT remove file path    Answer:   [email or web address]    Order Specific Question:   Preferred imaging location?    Answer:   [facility] (table limit-350 lbs)    INTERVAL HISTORY: Please see below for problem oriented charting. She returns for further follow-up She tolerated Arimidex well except for some mild arthralgias Denies hot flashes, mood swings or depression She complained of minimum fluid retention She denies any  recent abnormal breast examination, palpable mass, abnormal breast appearance or nipple changes She denies abdominal pain, nausea or changes in bowel habits. She has been referred to see a specialist at [facility] for hernia repair  SUMMARY OF ONCOLOGIC HISTORY: Oncology History   Genetic testing came back positive for ATM variant     Left ovarian epithelial cancer ([place])   08/19/2017 Imaging    She had outside CT scan which showed large abdominal mass      08/27/2017 Tumor Marker    Patient's tumor was tested for the following markers: CA-125 Results of the tumor marker test revealed 904.5      09/11/2017 Pathology Results    1. 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 Soft tissue, biopsy, right medial thigh MATURE LIPOMA 3. Ovary and fallopian tube, right ENDOMETRIOMA AND SIMPLE SEROUS CYST WITH FALLOPIAN TUBE ADHESION NEGATIVE FOR MALIGNANCY 4. Omentum, resection for tumor BENIGN OMENTUM NEGATIVE FOR CARCINOMA 5. Lymph nodes, regional resection, right pelvic EIGHT BENIGN LYMPH NODES (0/8) 6. Lymph nodes, regional resection, left pelvic SEVEN BENIGN LYMPH NODES (0/7) 7. Lymph node, biopsy, right para-aortic FOUR BENIGN LYMPH NODES (0/4) 8. Lymph node, biopsy, left para-aortic ONE BENIGN LYMPH NODE (0/1) 9. Peritoneum, biopsy, left abdominal BENIGN FIBROMUSCULAR TISSUE 10. Peritoneum, biopsy, right abdominal BENIGN FIBROMUSCULAR TISSUE WITH SEROSITIS 11. Peritoneum, biopsy, pelvic FIBROADIPOSE TISSUE WITH SEROSITIS  Specimen(s): Ovary and fallopian  tube Procedure: (including lymph node sampling): salpingo-oophorectomy Primary tumor site (including laterality): Left ovary Ovarian surface involvement: Yes Ovarian capsule intact without fragmentation: intact Maximum tumor size (cm): 20.0  cm Histologic type: Endometrioid carcinoma Grade: 1 Peritoneal implants: (specify invasive or non-invasive): Negative Pelvic extension (list additional structures on separate lines and if involved): Negative Lymph nodes: number examined 20 ; number positive 0 TNM code: pT1c2, pN0, pMx FIGO Stage (based on pathologic findings, needs clinical correlation): IC2      09/11/2017 Surgery    Preoperative Diagnosis: 1. left adnexal mass.  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   Procedure(s) Performed: 1. Exploratory laparotomy with bilateral salpingo-oophorectomy, pelvic and para-aortic lymph node dissection, omentectomy and radical debulking for ovarian cancer (CPT [phone])  Surgeon: Everitt Amber, M.D. Operative Findings:20 cm left ovarian mass.   No intraperitoneal rupture occurred;  Frozen pathology was consistent with adenocarcinoma; right tube and ovary normal in appearance; 3) normal bilateral pelvic and para-aortic lymph nodes and omentum; small and large bowel to palpation. This represented an optimal cytoreduction with no gross visible disease remaining.       10/09/2017 Tumor Marker    Patient's tumor was tested for the following markers: CA-125 Results of the tumor marker test revealed 68.7      10/15/2017 Adverse Reaction    She developed reaction to Paclitaxel.      10/15/2017 - 01/29/2018 Chemotherapy    She received carboplatin. Due to infusion reaction to Taxol with cycle 1, treatment was switched to carboplatin and Abraxane      11/03/2017 Procedure    Successful placement of a right internal jugular approach power injectable Port-A-Cath. The catheter is ready for immediate use.      11/27/2017 Tumor Marker    Patient's tumor was tested for the following markers: CA-125 Results of the tumor marker test revealed 27.7      12/12/2017 Genetic Testing    The patient had genetic testing due to a personal history of ovarian  cancer and a family history of pancreatic cancer (and possibly breast cancer).  The [facility] Hereditary Cancer Panel + tumor HRD analysis was ordered from the laboratory [facility]. The [facility] gene panel offered by [employer] includes sequencing and deletion/duplication testing of the following 29 genes: APC, ATM, BARD1, BMPR1A, BRCA1, BRCA2, BRIP1, CHD1, CDK4, CDKN2A, CHEK2, EPCAM (large rearrangement only), HOXB13, MLH1, MSH2, MSH6, MUTYH, NBN, PALB2, PMS2, PTEN, RAD51C, RAD51D, SMAD4, STK11, and TP53. Sequencing was performed for select regions of POLE and POLD1, and large rearrangement analysis was performed for select regions of GREM1.  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 [id number] (p.[id number]).  The date of this test report is 12/12/2017.      01/08/2018 Tumor Marker    Patient's tumor was tested for the following markers: CA-125 Results of the tumor marker test revealed 17.5      03/02/2018 Tumor Marker    Patient's tumor was tested for the following markers: CA-125 Results of the tumor marker test revealed 11.4      03/14/2018 Imaging    MRCP 1. No worrisome pancreatic lesions are currently present. 2. Cholelithiasis. 3. Small benign-appearing bilateral renal cysts. 4. Midline anterior abdominal wall laxity seen on coronal images, potential small hernia in this vicinity without observed complication      05/14/6811 Procedure    Successful right IJ vein Port-A-Cath explant.       REVIEW OF SYSTEMS:   Constitutional: Denies fevers, chills or  abnormal weight loss Eyes: Denies blurriness of vision Ears, nose, mouth, throat, and face: Denies mucositis or sore throat Respiratory: Denies cough, dyspnea or wheezes Cardiovascular: Denies palpitation, chest discomfort or lower extremity swelling Gastrointestinal:  Denies nausea, heartburn or change in bowel habits Skin: Denies abnormal skin rashes Lymphatics: Denies new lymphadenopathy or  easy bruising Neurological:Denies numbness, tingling or new weaknesses Behavioral/Psych: Mood is stable, no new changes  All other systems were reviewed with the patient and are negative.  I have reviewed the past medical history, past surgical history, social history and family history with the patient and they are unchanged from previous note.  ALLERGIES:  is allergic to bee venom; chlorhexidine; paclitaxel; other; bacitracin; and iodine.  MEDICATIONS:  Current Outpatient Medications  Medication Sig Dispense Refill  . acetaminophen (TYLENOL) 325 MG tablet Take 650 mg 2 (two) times daily as needed by mouth for headache.    Marland Kitchen amLODipine (NORVASC) 5 MG tablet Take 5 mg by mouth every evening.    Marland Kitchen anastrozole (ARIMIDEX) 1 MG tablet Take 1 tablet (1 mg total) by mouth daily. 90 tablet 9  . APPLE CIDER VINEGAR PO Take 1 capsule by mouth.    . Ascorbic Acid (VITAMIN C) 1000 MG tablet Take 1,000 mg by mouth every morning.     . Liniments (SALONPAS PAIN RELIEF PATCH EX) Place 1 patch onto the skin daily as needed (knee pain).    Marland Kitchen loratadine (CLARITIN) 10 MG tablet Take 10 mg by mouth every morning.     . Melatonin 1 MG CAPS Take 3 mg by mouth at bedtime.     . Menthol, Topical Analgesic, 4 % GEL Apply 1 application daily as needed topically (headache).    . Methylsulfonylmethane (MSM) 1000 MG CAPS Take 1,000 mg by mouth every morning.     . Misc Natural Products (TUMERSAID PO) Take 1 capsule by mouth.    . Multiple Vitamins-Minerals (HAIR/SKIN/NAILS) TABS Take 2 tablets by mouth every morning.    Marland Kitchen UNABLE TO FIND CPAP     No current facility-administered medications for this visit.     PHYSICAL EXAMINATION: ECOG PERFORMANCE STATUS: 1 - Symptomatic but completely ambulatory  Vitals:   04/20/18 1352  BP: 110/78  Pulse: 92  Resp: 18  Temp: 99.1 F (37.3 C)  SpO2: 99%   Filed Weights   04/20/18 1352  Weight: 186 lb 9.6 oz (84.6 kg)    GENERAL:alert, no distress and  comfortable SKIN: skin color, texture, turgor are normal, no rashes or significant lesions EYES: normal, Conjunctiva are pink and non-injected, sclera clear OROPHARYNX:no exudate, no erythema and lips, buccal mucosa, and tongue normal  NECK: supple, thyroid normal size, non-tender, without nodularity LYMPH:  no palpable lymphadenopathy in the cervical, axillary or inguinal LUNGS: clear to auscultation and percussion with normal breathing effort HEART: regular rate & rhythm and no murmurs and no lower extremity edema ABDOMEN:abdomen soft, non-tender and normal bowel sounds.  Abdominal wall hernia is noted Musculoskeletal:no cyanosis of digits and no clubbing  NEURO: alert & oriented x 3 with fluent speech, no focal motor/sensory deficits Bilateral breast examination is performed.  Noted breast reduction surgical scars.  No palpable abnormalities  LABORATORY DATA:  I have reviewed the data as listed    Component Value Date/Time   NA 141 03/02/2018 0943   NA 141 11/27/2017 0818   K 3.9 03/02/2018 0943   K 3.8 11/27/2017 0818   CL 103 03/02/2018 0943   CO2 27 03/02/2018 0943  CO2 25 11/27/2017 0818   GLUCOSE 102 03/02/2018 0943   GLUCOSE 90 11/27/2017 0818   BUN 11 03/02/2018 0943   BUN 12.9 11/27/2017 0818   CREATININE 0.87 03/02/2018 0943   CREATININE 0.8 11/27/2017 0818   CALCIUM 10.9 (H) 03/02/2018 0943   CALCIUM 9.9 11/27/2017 0818   PROT 7.3 03/02/2018 0943   PROT 7.0 11/27/2017 0818   ALBUMIN 4.1 03/02/2018 0943   ALBUMIN 3.8 11/27/2017 0818   AST 15 03/02/2018 0943   AST 11 11/27/2017 0818   ALT 15 03/02/2018 0943   ALT 11 11/27/2017 0818   ALKPHOS 69 03/02/2018 0943   ALKPHOS 68 11/27/2017 0818   BILITOT 0.4 03/02/2018 0943   BILITOT 0.41 11/27/2017 0818   GFRNONAA >60 03/02/2018 0943   GFRAA >60 03/02/2018 0943    No results found for: SPEP, UPEP  Lab Results  Component Value Date   WBC 6.2 03/17/2018   NEUTROABS 2.5 03/02/2018   HGB 11.5 (L) 03/17/2018    HCT 35.1 (L) 03/17/2018   MCV 98.3 03/17/2018   PLT 290 03/17/2018      Chemistry      Component Value Date/Time   NA 141 03/02/2018 0943   NA 141 11/27/2017 0818   K 3.9 03/02/2018 0943   K 3.8 11/27/2017 0818   CL 103 03/02/2018 0943   CO2 27 03/02/2018 0943   CO2 25 11/27/2017 0818   BUN 11 03/02/2018 0943   BUN 12.9 11/27/2017 0818   CREATININE 0.87 03/02/2018 0943   CREATININE 0.8 11/27/2017 0818      Component Value Date/Time   CALCIUM 10.9 (H) 03/02/2018 0943   CALCIUM 9.9 11/27/2017 0818   ALKPHOS 69 03/02/2018 0943   ALKPHOS 68 11/27/2017 0818   AST 15 03/02/2018 0943   AST 11 11/27/2017 0818   ALT 15 03/02/2018 0943   ALT 11 11/27/2017 0818   BILITOT 0.4 03/02/2018 0943   BILITOT 0.41 11/27/2017 0818       All questions were answered. The patient knows to call the clinic with any problems, questions or concerns. No barriers to learning was detected.  I spent 15 minutes counseling the patient face to face. The total time spent in the appointment was 20 minutes and more than 50% was on counseling and review of test results  Heath Lark, MD 04/20/2018 2:07 PM

## 2018-04-20 NOTE — Assessment & Plan Note (Addendum)
She is at risk of pancreatic cancer We discussed screening modality with endoscopic ultrasound with GI and MRCP Recent MRCP in April 2019 showed no evidence of pancreatic lesions She has appointment to see gastroenterologist for further management

## 2018-04-20 NOTE — Telephone Encounter (Signed)
Gave patient avs report and appointments for November.  °

## 2018-04-20 NOTE — Assessment & Plan Note (Signed)
She has completed adjuvant treatment I will schedule follow-up appointment for her to see GYN oncologist

## 2018-05-06 ENCOUNTER — Encounter: Payer: Self-pay | Admitting: Gastroenterology

## 2018-05-06 ENCOUNTER — Ambulatory Visit (INDEPENDENT_AMBULATORY_CARE_PROVIDER_SITE_OTHER): Payer: BLUE CROSS/BLUE SHIELD | Admitting: Gastroenterology

## 2018-05-06 VITALS — BP 120/70 | HR 80 | Ht 64.0 in | Wt 183.8 lb

## 2018-05-06 DIAGNOSIS — Z800 Family history of malignant neoplasm of digestive organs: Secondary | ICD-10-CM | POA: Diagnosis not present

## 2018-05-06 DIAGNOSIS — Z1211 Encounter for screening for malignant neoplasm of colon: Secondary | ICD-10-CM

## 2018-05-06 MED ORDER — PEG 3350-KCL-NA BICARB-NACL 420 G PO SOLR: 4000.0000 mL | ORAL | 0 refills | Status: DC

## 2018-05-06 NOTE — Patient Instructions (Addendum)
You will be set up for a colonoscopy for colon cancer screening. ROV in February 2020 to discuss pancreatic cancer screening again.  Normal BMI (Body Mass Index- based on height and weight) is between 19 and 25. Your BMI today is Body mass index is 31.55 kg/m. Sierra Gray Please consider follow up  regarding your BMI with your Primary Care Provider.

## 2018-05-06 NOTE — Progress Notes (Signed)
HPI: This is a very pleasant  57 year old who was referred to me by Heath Lark, MD  to evaluate abnormal genetic testing, possible increased risk of pancreatic cancer, routine risk for colon cancer.    Chief complaint is routine risk for colon cancer, possible increased risk for pancreatic cancer  Stage I ovarian cancer: 09/2017 withTAH BSO, adjuvant chemo ending 01/2018   Her ovarian cancer led to genetic testing, see those results below  Father had pancreatic cancer: in his 54's, died 4 months after diagnosis  No other pancreatic disease in her family  Her weight is up since stopping chemotherapy.  She has no significant abdominal pains.  She is planning on abdominal wall hernia surgery this summer at [facility].   Old Data Reviewed: Genetic Testing 12/2017      The patient had genetic testing due to a personal history of ovarian cancer and a family history of pancreatic cancer (and possibly breast cancer).  The [facility] Hereditary Cancer Panel + tumor HRD analysis was ordered from the laboratory [facility]. The [facility] gene panel offered by [employer] includes sequencing and deletion/duplication testing of the following 29 genes: APC, ATM, BARD1, BMPR1A, BRCA1, BRCA2, BRIP1, CHD1, CDK4, CDKN2A, CHEK2, EPCAM (large rearrangement only), HOXB13, MLH1, MSH2, MSH6, MUTYH, NBN, PALB2, PMS2, PTEN, RAD51C, RAD51D, SMAD4, STK11, and TP53. Sequencing was performed for select regions of POLE and POLD1, and large rearrangement analysis was performed for select regions of GREM1.  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  The date of this test report is 12/12/2017.    Note from genetic councilor 12/2017 Given Ms. Sosinski's family history of pancreatic cancer in her father we discussed that pancreatic cancer screening is something that has been studied and is recently available.  It typically consists of MRCP and Endoscopic Ultrasound (EUS)  alternating annually.  However, these screening methods have not been proven to be highly effective at detecting all pancreatic cancers and the data regarding survival benefit is very limited.  Given the limited evidence regarding pancreatic cancer risk in ATM mutation carriers as well as the limited information about the effectiveness of pancreatic screening methods, the utility and benefit of this screening for Ms. Runions is uncertain.  There are no guidelines or recommendations regarding pancreatic screening in individuals with ATM mutations, however if she was interested in pursuing this option or discussing it further we could refer her to Dr. Burr Medico the oncologist who is involved with this type of screening. Additionally, because this screening method is not a national guideline, it is uncertain if this screening would be covered by insurance.   NCCN guidelines regarding her mutation (2019) Unknown or insufficient evidence for pancreatic or prostate cancer risk.    MRI with MRCP 03/2018 IMPRESSION: 1. No worrisome pancreatic lesions are currently present. 2. Cholelithiasis. 3. Small benign-appearing bilateral renal cysts. 4. Midline anterior abdominal wall laxity seen on coronal images, potential small hernia in this vicinity without observed complication.   Review of systems: Pertinent positive and negative review of systems were noted in the above HPI section. All other review negative.   Past Medical History:  Diagnosis Date  . Anemia    hx of  . Arthritis    oa knees  . Breast lump in female    benign  . Family history of pancreatic cancer 11/27/2017  . Family history of prostate cancer 11/27/2017  . Fibroids   . Hyperlipidemia   . Hypertension   . Migraine  hx of   . Ovarian cancer ([place])   . Pelvic mass in female   . Pelvic mass in female   . PONV (postoperative nausea and vomiting)    nausea only  . Sleep apnea     Past Surgical History:  Procedure  Laterality Date  . 2 lumps right breast removed  yrs ago  . ABDOMINAL HYSTERECTOMY  2009   Total laparoscopic hyst for fibroids  . BILATERAL SALPINGECTOMY Bilateral 09/11/2017   Procedure: BILATERAL SALPINGECTOMY WITH OOPHERECTOMY;  Surgeon: Everitt Amber, MD;  Location: [facility];  Service: Gynecology;  Laterality: Bilateral;  . BREAST BIOPSY  12/16/2014   2 lumps in  right breast removed  . EXCISION OF SKIN TAG N/A 09/11/2017   Procedure: EXCISION OF SKIN TAG ON THIGH;  Surgeon: Everitt Amber, MD;  Location: [facility];  Service: Gynecology;  Laterality: N/A;  . IR FLUORO GUIDE PORT INSERTION RIGHT  11/03/2017  . IR REMOVAL TUN ACCESS W/ PORT W/O FL MOD SED  03/17/2018  . IR US GUIDE VASC ACCESS RIGHT  11/03/2017  . KNEE SURGERY Right    x3  . LAPAROTOMY Bilateral 09/11/2017   Procedure: EXPLORATORY LAPAROTOMY;  Surgeon: Everitt Amber, MD;  Location: [facility];  Service: Gynecology;  Laterality: Bilateral;  . LAPAROTOMY WITH STAGING N/A 09/11/2017   Procedure: PELVIC AND PARA AORTIC LYMPH NODE  DISSECTION;  Surgeon: Everitt Amber, MD;  Location: [facility];  Service: Gynecology;  Laterality: N/A;  . OMENTECTOMY N/A 09/11/2017   Procedure: OMENTECTOMY;  Surgeon: Everitt Amber, MD;  Location: [facility];  Service: Gynecology;  Laterality: N/A;  . REDUCTION MAMMAPLASTY    . WISDOM TOOTH EXTRACTION      Current Outpatient Medications  Medication Sig Dispense Refill  . AMBULATORY NON FORMULARY MEDICATION Medication Name: Turmeric with ginger 500 mg tablet-1 tablet by mouth daily    . amLODipine (NORVASC) 5 MG tablet Take 5 mg by mouth every evening.    Marland Kitchen anastrozole (ARIMIDEX) 1 MG tablet Take 1 tablet (1 mg total) by mouth daily. 90 tablet 9  . Ascorbic Acid (VITAMIN C) 1000 MG tablet Take 1,000 mg by mouth every morning.     . Biotin 10000 MCG TABS Take 1 tablet by mouth daily.    . Cholecalciferol (VITAMIN D3) 25 MCG TABS Take 1 tablet by mouth daily.    . Cyanocobalamin 2500 MCG TABS Take 1 tablet by mouth daily.     Marland Kitchen ibuprofen (ADVIL,MOTRIN) 200 MG tablet Take 400 mg by mouth every 6 (six) hours as needed.    . Liniments (SALONPAS PAIN RELIEF PATCH EX) Place 1 patch onto the skin daily as needed (knee pain).    Marland Kitchen loratadine (CLARITIN) 10 MG tablet Take 10 mg by mouth every morning.     . Melatonin 3 MG TABS Take 1 tablet by mouth daily.    . Methylsulfonylmethane (MSM) 1000 MG CAPS Take 1,000 mg by mouth every morning.     . Misc Natural Products (TUMERSAID PO) Take 1 capsule by mouth.    Marland Kitchen UNABLE TO FIND CPAP    . APPLE CIDER VINEGAR PO Take 1 capsule by mouth. 500 mg tablet     No current facility-administered medications for this visit.     Allergies as of 05/06/2018 - Review Complete 05/06/2018  Allergen Reaction Noted  . Bee venom Anaphylaxis 09/11/2017  . Chlorhexidine Rash 08/27/2017  . Paclitaxel Other (See Comments) 10/15/2017  . Other  09/11/2017  . Bacitracin Rash   . Iodine  Rash 08/27/2017    Family History  Problem Relation Age of Onset  . Hypertension Mother   . Hyperlipidemia Mother   . Pancreatic cancer Father 97       died 66 months after dx  . Hypertension Father   . Prostate cancer Father 54       had surgery for it  . Breast cancer Maternal Aunt        dx 72's- she had surgery, think it was cancer but could have been just a lump removed  . Heart disease Maternal Aunt   . Hyperlipidemia Maternal Uncle   . Diabetes Maternal Uncle   . Stroke Maternal Uncle   . Osteoporosis Maternal Uncle   . Heart attack Maternal Grandmother   . Diabetes Maternal Grandfather   . Dementia Maternal Grandfather   . Osteoporosis Paternal Grandmother   . Colon cancer Paternal Grandfather 49    Social History   Socioeconomic History  . Marital status: Single    Spouse name: Not on file  . Number of children: Not on file  . Years of education: Not on file  . Highest education level: Not on file  Occupational History  . Occupation: Freight forwarder  Social Needs  . Financial resource  strain: Not on file  . Food insecurity:    Worry: Not on file    Inability: Not on file  . Transportation needs:    Medical: Not on file    Non-medical: Not on file  Tobacco Use  . Smoking status: Never Smoker  . Smokeless tobacco: Never Used  Substance and Sexual Activity  . Alcohol use: Yes    Comment: Socially drinks wine/beer  . Drug use: No  . Sexual activity: Never  Lifestyle  . Physical activity:    Days per week: Not on file    Minutes per session: Not on file  . Stress: Not on file  Relationships  . Social connections:    Talks on phone: Not on file    Gets together: Not on file    Attends religious service: Not on file    Active member of club or organization: Not on file    Attends meetings of clubs or organizations: Not on file    Relationship status: Not on file  . Intimate partner violence:    Fear of current or ex partner: Not on file    Emotionally abused: Not on file    Physically abused: Not on file    Forced sexual activity: Not on file  Other Topics Concern  . Not on file  Social History Narrative  . Not on file     Physical Exam: Ht '5\' 4"'$  (1.626 m)   Wt 183 lb 12.8 oz (83.4 kg)   BMI 31.55 kg/m  Constitutional: generally well-appearing Psychiatric: alert and oriented x3 Eyes: extraocular movements intact Mouth: oral pharynx moist, no lesions Neck: supple no lymphadenopathy Cardiovascular: heart regular rate and rhythm Lungs: clear to auscultation bilaterally Abdomen: soft, nontender, nondistended, no obvious ascites, no peritoneal signs, normal bowel sounds Extremities: no lower extremity edema bilaterally Skin: no lesions on visible extremities   Assessment and plan: 57 y.o. female with ATM mutation, average risk for colon cancer  2019 NCCN guidelines state there is "insufficient evidence" about whether her specific mutation portends her an increased risk of pancreatic cancer.  She does have pancreatic cancer in her family with her  father who died in his 70s from it.  It sounds like that was adenocarcinoma  based on the very quick course.  Pancreatic cancer screening has not been proven to improve mortality.  She had an MRI, MRCP of his her abdomen last month and it showed her pancreas was normal.  I recommended that she and I sit down again in the office early 2020 to discuss pancreatic cancer screening again.  If she were to opt for screening since there are no guidelines we can go on expert recommendations which say annual screening with MRI or endoscopic ultrasound is a reasonable method.  She would not be "due" for either of those until April 2020 and so I want to sit down with her a month or 2 prior to that to rediscuss this complex area again.  It is more clear however that she is due for colon cancer screening. She would like to proceed with colonoscopy.  I see no reason for any further blood tests or imaging studies prior to then    Please see the "Patient Instructions" section for addition details about the plan.   Owens Loffler, MD [facility] 05/06/2018, 9:25 AM  Cc: Heath Lark, MD

## 2018-05-14 ENCOUNTER — Encounter: Payer: Self-pay | Admitting: Gynecologic Oncology

## 2018-05-14 ENCOUNTER — Inpatient Hospital Stay: Payer: BLUE CROSS/BLUE SHIELD | Attending: Hematology and Oncology | Admitting: Gynecologic Oncology

## 2018-05-14 ENCOUNTER — Inpatient Hospital Stay: Payer: BLUE CROSS/BLUE SHIELD

## 2018-05-14 VITALS — BP 129/75 | HR 98 | Temp 98.4°F | Resp 20 | Ht 64.0 in | Wt 186.0 lb

## 2018-05-14 DIAGNOSIS — C786 Secondary malignant neoplasm of retroperitoneum and peritoneum: Secondary | ICD-10-CM | POA: Diagnosis not present

## 2018-05-14 DIAGNOSIS — C562 Malignant neoplasm of left ovary: Secondary | ICD-10-CM

## 2018-05-14 DIAGNOSIS — Z90722 Acquired absence of ovaries, bilateral: Secondary | ICD-10-CM | POA: Insufficient documentation

## 2018-05-14 DIAGNOSIS — Z9221 Personal history of antineoplastic chemotherapy: Secondary | ICD-10-CM | POA: Diagnosis not present

## 2018-05-14 NOTE — Progress Notes (Signed)
Follow-up Note: Gyn-Onc  Sierra Gray 57 y.o. female  CC:  Chief Complaint  Patient presents with  . 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  S/p adjuvant chemotherapy with 6 cycles carboplatin and paclitaxel completed 01/29/18.   Complete clinical response.  Recommendation is for 3 monthly surveillance visits   HPI: Patient was seen in consultation at the request of Dr. Nicoletta Dress.  Patient is a very pleasant 57 year old gravida 1 para 0 who in late August after having her [place] visit set up from the dental care and felt some abdominal discomfort. The pain increased and she subsequently that evening had a temperature of 102. This was corresponding with increased abdominal pain and then the abdominal pain dissipated. Since then she's had a occasional pain and last Monday. Due to the persistent discomfort she saw her primary care physician. She describes the pain as a occurring in both her right and left lower quadrants. She did not notice any upper abdominal discomfort. There's been no change in her bowel or bladder habits. She does endorse approximately a 20-25 pound weight loss in the last 6-9 months. Much of this has been intentional. An Thanksgiving of last year to the Christmas holiday she lost 10 pounds which was not intentional but since then her weight loss has been intentional and she feels that she's eating well.  She underwent a CT scan on 08/19/2017. It revealed a trace amount of ascites in the peritoneal cavity seen prior dominant weight in the lateral paracolic gutter and along the liver capsule. There was no nodule letter peritoneal enhancement identified to suggest peritoneal carcinomatosis. There really does state that there is a mass that is cystic and solid that replaces the entire uterus. It extends caudally to the cervix and appears to invade the upper one third of the  vagina. The caudal margin of the neoplasm made directly invading the posterior bladder dome to the medial thickness is normal. No fat separate the mass from the adjacent upper third of the rectum or from the mid sigmoid. The mass measures 12 x 13 x 20 cm in size and posteriorly and laterally displaces the right ovary. The left ovary was not competent to be identified. Of note the patient had undergone a laparoscopic hysterectomy at [facility] approximate 14 years ago secondary to fibroids.  She has not yet had tumor markers drawn. She states she's never had a colonoscopy. She's not sure if her last mammogram was in 2017 but she thinks she might be due this year. She does have family history of cancer her father died of pancreatic cancer in his late 41s. His maternal aunt with breast cancer in her 80s. A paternal grandfather died of colon cancer at 52. She does endorse allergies to chlorhexidine which causes a rash. This was an issue when she had knee surgery on the right. She believes Betadine is okay.  On 09/11/17 she underwent ex lap, BSO, omentectomy, pelvic and para-aortic lymphadenectomy, radical tumor debulking. Intraoperative findings were significant for a 20+cm left ovarian mass with nromal right ovary and omentum. No extraovarian disease was seen. Frozen confirmed adenocarcinoma.  There was no gross residual disease at completion of the surgery.  Final pathology confirmed a 20cm grade 1 endometrioid left ovarian adenocarcinoma with surface involvement but negative washings (stage IC2).  All other resected tissue (including omentum, nodes and peritoneal biopsy and washings) were negative.  In accordance with NCCN guidelines  she was recommended adjuvant carboplatin and paclitaxel chemotherapy and her case was discussed at multi-disciplinary tumor conference.  Interval Hx:  She went on to complete adjuvant chemotherapy with 6 cycles of carboplatin and paclitaxel in February, 2019. She tolerated  therapy well and had a complete clinical response.   She presents today for follow-up. She has a ventral abdominal hernia for which she is having surgery at [facility] in June, 2019.  Review of Systems: Constitutional:  Denies fever.  Skin: no lesions or rash Cardiovascular: No chest pain, shortness of breath, or edema  Pulmonary: No cough or wheeze.  Gastro Intestinal: Reporting intermittent lower abdominal soreness.  No nausea, vomiting, constipation, or diarrhea reported. No bright red blood per rectum or change in bowel movement.  Genitourinary: Denies vaginal bleeding and discharge.  Musculoskeletal: no masses   Psychology: No complaints  Current Meds:  Outpatient Encounter Medications as of 05/14/2018  Medication Sig  . AMBULATORY NON FORMULARY MEDICATION Medication Name: Turmeric with ginger 500 mg tablet-1 tablet by mouth daily  . amLODipine (NORVASC) 5 MG tablet Take 5 mg by mouth every evening.  Marland Kitchen anastrozole (ARIMIDEX) 1 MG tablet Take 1 tablet (1 mg total) by mouth daily.  . APPLE CIDER VINEGAR PO Take 1 capsule by mouth. 500 mg tablet  . Ascorbic Acid (VITAMIN C) 1000 MG tablet Take 1,000 mg by mouth every morning.   . Biotin 10000 MCG TABS Take 1 tablet by mouth daily.  . Cholecalciferol (VITAMIN D3) 25 MCG TABS Take 1 tablet by mouth daily.  . Cyanocobalamin 2500 MCG TABS Take 1 tablet by mouth daily.  Marland Kitchen ibuprofen (ADVIL,MOTRIN) 200 MG tablet Take 400 mg by mouth every 6 (six) hours as needed.  . Liniments (SALONPAS PAIN RELIEF PATCH EX) Place 1 patch onto the skin daily as needed (knee pain).  Marland Kitchen loratadine (CLARITIN) 10 MG tablet Take 10 mg by mouth every morning.   . Melatonin 3 MG TABS Take 1 tablet by mouth daily.  . Methylsulfonylmethane (MSM) 1000 MG CAPS Take 1,000 mg by mouth every morning.   . Misc Natural Products (TUMERSAID PO) Take 1 capsule by mouth.  . polyethylene glycol-electrolytes (NULYTELY/GOLYTELY) 420 g solution Take 4,000 mLs by mouth as directed.  Marland Kitchen  UNABLE TO FIND CPAP   No facility-administered encounter medications on file as of 05/14/2018.     Allergy:  Allergies  Allergen Reactions  . Bee Venom Anaphylaxis  . Chlorhexidine Rash    Surgical prep wash  . Paclitaxel Other (See Comments)    Experienced severe back pain and flushing  . Other     Ortho prep  . Bacitracin Rash  . Iodine Rash    Social Hx:   Social History   Socioeconomic History  . Marital status: Single    Spouse name: Not on file  . Number of children: Not on file  . Years of education: Not on file  . Highest education level: Not on file  Occupational History  . Occupation: Freight forwarder  Social Needs  . Financial resource strain: Not on file  . Food insecurity:    Worry: Not on file    Inability: Not on file  . Transportation needs:    Medical: Not on file    Non-medical: Not on file  Tobacco Use  . Smoking status: Never Smoker  . Smokeless tobacco: Never Used  Substance and Sexual Activity  . Alcohol use: Yes    Comment: Socially drinks wine/beer  . Drug use: No  . Sexual  activity: Never  Lifestyle  . Physical activity:    Days per week: Not on file    Minutes per session: Not on file  . Stress: Not on file  Relationships  . Social connections:    Talks on phone: Not on file    Gets together: Not on file    Attends religious service: Not on file    Active member of club or organization: Not on file    Attends meetings of clubs or organizations: Not on file    Relationship status: Not on file  . Intimate partner violence:    Fear of current or ex partner: Not on file    Emotionally abused: Not on file    Physically abused: Not on file    Forced sexual activity: Not on file  Other Topics Concern  . Not on file  Social History Narrative  . Not on file    Past Surgical Hx:  Past Surgical History:  Procedure Laterality Date  . ABDOMINAL HYSTERECTOMY  2009   Total laparoscopic hyst for fibroids  . BILATERAL SALPINGECTOMY Bilateral  09/11/2017   Procedure: BILATERAL SALPINGECTOMY WITH OOPHERECTOMY;  Surgeon: Everitt Amber, MD;  Location: [facility];  Service: Gynecology;  Laterality: Bilateral;  . BREAST BIOPSY  12/16/2014   2 lumps in  right breast removed  . BREAST LUMPECTOMY Left   . EXCISION OF SKIN TAG N/A 09/11/2017   Procedure: EXCISION OF SKIN TAG ON THIGH;  Surgeon: Everitt Amber, MD;  Location: [facility];  Service: Gynecology;  Laterality: N/A;  . IR FLUORO GUIDE PORT INSERTION RIGHT  11/03/2017  . IR REMOVAL TUN ACCESS W/ PORT W/O FL MOD SED  03/17/2018  . IR US GUIDE VASC ACCESS RIGHT  11/03/2017  . KNEE SURGERY Right    x3  . LAPAROTOMY Bilateral 09/11/2017   Procedure: EXPLORATORY LAPAROTOMY;  Surgeon: Everitt Amber, MD;  Location: [facility];  Service: Gynecology;  Laterality: Bilateral;  . LAPAROTOMY WITH STAGING N/A 09/11/2017   Procedure: PELVIC AND PARA AORTIC LYMPH NODE  DISSECTION;  Surgeon: Everitt Amber, MD;  Location: [facility];  Service: Gynecology;  Laterality: N/A;  . OMENTECTOMY N/A 09/11/2017   Procedure: OMENTECTOMY;  Surgeon: Everitt Amber, MD;  Location: [facility];  Service: Gynecology;  Laterality: N/A;  . REDUCTION MAMMAPLASTY    . WISDOM TOOTH EXTRACTION      Past Medical Hx:  Past Medical History:  Diagnosis Date  . Anemia    hx of  . Arthritis    oa knees  . Breast lump in female    benign  . Family history of pancreatic cancer 11/27/2017  . Family history of prostate cancer 11/27/2017  . Fibroids   . Hyperlipidemia   . Hypertension   . Migraine    hx of   . Ovarian cancer ([place])   . Pelvic mass in female   . Pelvic mass in female   . PONV (postoperative nausea and vomiting)    nausea only  . 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 Soft tissue, biopsy, right medial thigh MATURE LIPOMA 3. Ovary and fallopian tube, right ENDOMETRIOMA AND SIMPLE SEROUS CYST WITH FALLOPIAN TUBE ADHESION NEGATIVE FOR MALIGNANCY 4. Omentum, resection for tumor BENIGN OMENTUM NEGATIVE FOR CARCINOMA 5. Lymph nodes, regional resection, right pelvic EIGHT BENIGN LYMPH NODES (0/8) 6. Lymph nodes, regional resection, left pelvic SEVEN BENIGN LYMPH NODES (0/7) 7. Lymph node, biopsy, right para-aortic FOUR BENIGN LYMPH NODES (0/4) 8. Lymph node, biopsy, left para-aortic ONE BENIGN LYMPH NODE (0/1) 9. Peritoneum, biopsy, left abdominal BENIGN FIBROMUSCULAR TISSUE 10. Peritoneum, biopsy, right abdominal BENIGN FIBROMUSCULAR TISSUE WITH SEROSITIS 11. Peritoneum, biopsy, pelvic FIBROADIPOSE TISSUE WITH SEROSITIS  Specimen(s): Ovary and fallopian tube Procedure: (including lymph node sampling): salpingo-oophorectomy Primary tumor site (including laterality): Left ovary Ovarian surface involvement: Yes Ovarian capsule intact without fragmentation: intact Maximum tumor size (cm): 20.0 cm Histologic type: Endometrioid carcinoma Grade: 1 Peritoneal implants: (specify invasive or non-invasive): Negative Pelvic extension (list additional structures on separate lines and if involved): Negative Lymph nodes: number examined 20 ; number positive 0 TNM code: pT1c2, pN0, pMx FIGO Stage (based on pathologic findings, needs clinical correlation): IC2      09/11/2017 Surgery    Preoperative Diagnosis: 1. left adnexal mass.  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   Procedure(s)  Performed: 1. Exploratory laparotomy with bilateral salpingo-oophorectomy, pelvic and para-aortic lymph node dissection, omentectomy and radical debulking for ovarian cancer (CPT [phone])  Surgeon: Everitt Amber, M.D. Operative Findings:20 cm left ovarian mass.   No intraperitoneal rupture occurred;  Frozen pathology was consistent with adenocarcinoma; right tube and ovary normal in appearance; 3) normal bilateral pelvic and para-aortic lymph nodes and omentum; small and large bowel to palpation. This represented an optimal cytoreduction with no gross visible disease remaining.       10/09/2017 Tumor Marker    Patient's tumor was tested for the following markers: CA-125 Results of the tumor marker test revealed 68.7      10/15/2017 Adverse Reaction    She developed reaction to Paclitaxel.      10/15/2017 - 01/29/2018 Chemotherapy    She received carboplatin. Due to infusion reaction to Taxol with cycle 1, treatment was switched to carboplatin and Abraxane      11/03/2017 Procedure    Successful placement of a right internal jugular approach power injectable Port-A-Cath. The catheter is ready for immediate use.      11/27/2017 Tumor Marker    Patient's tumor was tested for the following markers: CA-125 Results of the tumor marker test revealed 27.7      12/12/2017 Genetic Testing    The patient had genetic testing due to a personal history of ovarian cancer and a family history of pancreatic cancer (and possibly breast cancer).  The [facility] Hereditary Cancer Panel + tumor HRD analysis was ordered from the laboratory [facility]. The [facility] gene panel offered by [employer] includes sequencing and deletion/duplication testing of the following 29 genes: APC, ATM, BARD1, BMPR1A, BRCA1, BRCA2, BRIP1, CHD1, CDK4, CDKN2A, CHEK2, EPCAM (large rearrangement only), HOXB13, MLH1, MSH2, MSH6, MUTYH, NBN, PALB2, PMS2, PTEN, RAD51C, RAD51D, SMAD4, STK11, and TP53. Sequencing was performed for  select regions of POLE and POLD1, and large rearrangement analysis was performed for select regions of GREM1.  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 [id number] (p.[id number]).  The date of this test report is 12/12/2017.      01/08/2018 Tumor Marker    Patient's tumor was tested for the following markers: CA-125 Results of the tumor marker test revealed 17.5      03/02/2018 Tumor Marker    Patient's tumor was tested for the following markers: CA-125 Results of the tumor marker test revealed 11.4      03/14/2018 Imaging    MRCP 1. No worrisome pancreatic lesions are currently present. 2. Cholelithiasis. 3. Small benign-appearing bilateral renal cysts. 4. Midline anterior abdominal wall laxity seen on coronal images, potential small hernia in this vicinity without observed complication      06/13/5464 Procedure    Successful right IJ vein Port-A-Cath explant.       Family Hx:  Family History  Problem Relation Age of Onset  . Hypertension Mother   . Hyperlipidemia Mother   . Pancreatic cancer Father 40       died 65 months after dx  . Hypertension Father        questionable  . Prostate cancer Father 32       had surgery for it  . Breast cancer Maternal Aunt        dx 22's- she had surgery, think it was cancer but could have been just a lump removed  . Heart disease Maternal Aunt   . Lung cancer Maternal Aunt        primary  . Heart attack Maternal Grandmother   . Diabetes Maternal Grandfather   . Dementia Maternal Grandfather   . Osteoporosis Paternal Grandmother   . Colon cancer Paternal Grandfather 49    Vitals:  Blood pressure 129/75, pulse 98, temperature 98.4 F (36.9 C), temperature source Oral, resp. rate 20, height _0  (1.626 m), weight 186 lb (84.4 kg), SpO2 100 %.  Physical Exam: Well-nourished well-developed female in no acute distress.  Neck: Supple, no lymphadenopathy, no thyromegaly.  Lungs: Clear to auscultation  bilaterally.  Cardiac: Regular rate and rhythm.  Abdomen: Obese, soft, nontender, nondistended. The incision exhibits erythema which is blanching near to incision. No drainage or collections appreciated.  Groins: No lymphadenopathy.  Extremities no edema.  Pelvic: deferred   Thereasa Solo, MD 05/14/2018, 2:30 PM

## 2018-05-14 NOTE — Patient Instructions (Signed)
Please notify Dr Denman George at phone number [phone] if you notice vaginal bleeding, new pelvic or abdominal pains, bloating, feeling full easy, or a change in bladder or bowel function.   Please return to see Dr Denman George in September, 2019 for followup with a CA 125 lab draw to monitor your ovarian cancer.

## 2018-05-15 ENCOUNTER — Telehealth: Payer: Self-pay | Admitting: *Deleted

## 2018-05-15 LAB — CA 125: Cancer Antigen (CA) 125: 9.8 U/mL (ref 0.0–38.1)

## 2018-05-15 NOTE — Telephone Encounter (Signed)
I called patient to tell her the results of her CA-125.  Per [name], NP her CA-125 is stable and within normal limits.  Patient verbalized understanding.

## 2018-06-15 ENCOUNTER — Other Ambulatory Visit: Payer: Self-pay | Admitting: Hematology and Oncology

## 2018-06-15 DIAGNOSIS — Z1231 Encounter for screening mammogram for malignant neoplasm of breast: Secondary | ICD-10-CM

## 2018-06-15 DIAGNOSIS — Z1589 Genetic susceptibility to other disease: Secondary | ICD-10-CM

## 2018-06-15 DIAGNOSIS — Z1509 Genetic susceptibility to other malignant neoplasm: Principal | ICD-10-CM

## 2018-06-15 DIAGNOSIS — Z1501 Genetic susceptibility to malignant neoplasm of breast: Secondary | ICD-10-CM

## 2018-06-18 ENCOUNTER — Telehealth: Payer: Self-pay

## 2018-06-18 NOTE — Telephone Encounter (Signed)
Spoke with pt by phone and she states she has her breast MRI scheduled for Saturday.

## 2018-06-20 ENCOUNTER — Ambulatory Visit (HOSPITAL_COMMUNITY)
Admission: RE | Admit: 2018-06-20 | Discharge: 2018-06-20 | Disposition: A | Discharge status: Home / Self Care | Payer: BLUE CROSS/BLUE SHIELD | Source: Ambulatory Visit | Attending: Hematology and Oncology | Admitting: Hematology and Oncology

## 2018-06-20 DIAGNOSIS — Z1231 Encounter for screening mammogram for malignant neoplasm of breast: Secondary | ICD-10-CM

## 2018-06-20 DIAGNOSIS — N6320 Unspecified lump in the left breast, unspecified quadrant: Secondary | ICD-10-CM | POA: Diagnosis not present

## 2018-06-20 DIAGNOSIS — N6310 Unspecified lump in the right breast, unspecified quadrant: Secondary | ICD-10-CM | POA: Insufficient documentation

## 2018-06-20 MED ORDER — GADOBENATE DIMEGLUMINE 529 MG/ML IV SOLN
20.0000 mL | Freq: Once | INTRAVENOUS | Status: AC | PRN
  Administered 2018-06-20: 18 mL via INTRAVENOUS

## 2018-06-22 LAB — POCT I-STAT CREATININE: Creatinine, Ser: 0.8 mg/dL (ref 0.44–1.00)

## 2018-06-23 ENCOUNTER — Telehealth: Payer: Self-pay

## 2018-06-23 NOTE — Telephone Encounter (Signed)
-----   Message from Heath Lark, MD sent at 06/23/2018  2:31 PM EDT ----- Regarding: MRI breast ok Pls let her know MRI breast normal

## 2018-06-23 NOTE — Telephone Encounter (Signed)
Called and left below message. Instructed to call office for questions.

## 2018-07-13 ENCOUNTER — Telehealth: Payer: Self-pay | Admitting: Gastroenterology

## 2018-07-13 NOTE — Telephone Encounter (Signed)
error 

## 2018-07-15 ENCOUNTER — Encounter: Payer: Self-pay | Admitting: Gastroenterology

## 2018-07-15 ENCOUNTER — Ambulatory Visit (AMBULATORY_SURGERY_CENTER): Payer: BLUE CROSS/BLUE SHIELD | Admitting: Gastroenterology

## 2018-07-15 VITALS — BP 88/58 | HR 79 | Temp 98.6°F | Resp 15 | Ht 64.0 in | Wt 183.0 lb

## 2018-07-15 DIAGNOSIS — D125 Benign neoplasm of sigmoid colon: Secondary | ICD-10-CM | POA: Diagnosis not present

## 2018-07-15 DIAGNOSIS — K6389 Other specified diseases of intestine: Secondary | ICD-10-CM

## 2018-07-15 DIAGNOSIS — K5730 Diverticulosis of large intestine without perforation or abscess without bleeding: Secondary | ICD-10-CM

## 2018-07-15 DIAGNOSIS — Z1211 Encounter for screening for malignant neoplasm of colon: Secondary | ICD-10-CM

## 2018-07-15 MED ORDER — SODIUM CHLORIDE 0.9 % IV SOLN
500.0000 mL | Freq: Once | INTRAVENOUS | Status: DC
Start: 2018-07-15 — End: 2020-07-11

## 2018-07-15 NOTE — Progress Notes (Signed)
Called to room to assist during endoscopic procedure.  Patient ID and intended procedure confirmed with present staff. Received instructions for my participation in the procedure from the performing physician.  

## 2018-07-15 NOTE — Patient Instructions (Signed)
  Thank you for allowing [name] to care for you today!  Pathology results are being rushed processed and Dr Ardis Hughs will be calling as soon as he gets the results.   YOU HAD AN ENDOSCOPIC PROCEDURE TODAY AT [facility]:   Refer to the procedure report that was given to you for any specific questions about what was found during the examination.  If the procedure report does not answer your questions, please call your gastroenterologist to clarify.  If you requested that your care partner not be given the details of your procedure findings, then the procedure report has been included in a sealed envelope for you to review at your convenience later.  YOU SHOULD EXPECT: Some feelings of bloating in the abdomen. Passage of more gas than usual.  Walking can help get rid of the air that was put into your GI tract during the procedure and reduce the bloating. If you had a lower endoscopy (such as a colonoscopy or flexible sigmoidoscopy) you may notice spotting of blood in your stool or on the toilet paper. If you underwent a bowel prep for your procedure, you may not have a normal bowel movement for a few days.  Please Note:  You might notice some irritation and congestion in your nose or some drainage.  This is from the oxygen used during your procedure.  There is no need for concern and it should clear up in a day or so.  SYMPTOMS TO REPORT IMMEDIATELY:   Following lower endoscopy (colonoscopy or flexible sigmoidoscopy):  Excessive amounts of blood in the stool  Significant tenderness or worsening of abdominal pains  Swelling of the abdomen that is new, acute  Fever of 100F or higher    For urgent or emergent issues, a gastroenterologist can be reached at any hour by calling [phone].   DIET:  We do recommend a small meal at first, but then you may proceed to your regular diet.  Drink plenty of fluids but you should avoid alcoholic beverages for 24 hours.  ACTIVITY:  You  should plan to take it easy for the rest of today and you should NOT DRIVE or use heavy machinery until tomorrow (because of the sedation medicines used during the test).    FOLLOW UP: Our staff will call the number listed on your records the next business day following your procedure to check on you and address any questions or concerns that you may have regarding the information given to you following your procedure. If we do not reach you, we will leave a message.  However, if you are feeling well and you are not experiencing any problems, there is no need to return our call.  We will assume that you have returned to your regular daily activities without incident.  If any biopsies were taken you will be contacted by phone or by letter within the next 1-3 weeks.  Please call us at [phone] if you have not heard about the biopsies in 3 weeks.    SIGNATURES/CONFIDENTIALITY: You and/or your care partner have signed paperwork which will be entered into your electronic medical record.  These signatures attest to the fact that that the information above on your After Visit Summary has been reviewed and is understood.  Full responsibility of the confidentiality of this discharge information lies with you and/or your care-partner.

## 2018-07-15 NOTE — Progress Notes (Signed)
Pathology sent rush per D.O. 

## 2018-07-15 NOTE — Progress Notes (Signed)
To PACU, VSS. Report to [email or web address] 

## 2018-07-15 NOTE — Progress Notes (Signed)
Pt's states no medical or surgical changes since previsit or office visit. 

## 2018-07-15 NOTE — Op Note (Signed)
[facility] Patient Name: Sierra Gray Procedure Date: 07/15/2018 8:50 AM MRN: [id number] Endoscopist: Milus Banister , MD Age: 57 Referring MD:  Date of Birth: 1961/11/29 Gender: Female Account #: [id number] Procedure:                Colonoscopy Indications:              Screening for colorectal malignant neoplasm Medicines:                Monitored Anesthesia Care Procedure:                Pre-Anesthesia Assessment:                           - Prior to the procedure, a History and Physical                            was performed, and patient medications and                            allergies were reviewed. The patient's tolerance of                            previous anesthesia was also reviewed. The risks                            and benefits of the procedure and the sedation                            options and risks were discussed with the patient.                            All questions were answered, and informed consent                            was obtained. Prior Anticoagulants: The patient has                            taken no previous anticoagulant or antiplatelet                            agents. ASA Grade Assessment: II - A patient with                            mild systemic disease. After reviewing the risks                            and benefits, the patient was deemed in                            satisfactory condition to undergo the procedure.                           After obtaining informed consent, the colonoscope  was passed under direct vision. Throughout the                            procedure, the patient's blood pressure, pulse, and                            oxygen saturations were monitored continuously. The                            Colonoscope was introduced through the anus and                            advanced to the the cecum, identified by                            appendiceal orifice and  ileocecal valve. The                            colonoscopy was performed without difficulty. The                            patient tolerated the procedure well. The quality                            of the bowel preparation was good. The ileocecal                            valve, appendiceal orifice, and rectum were                            photographed. Scope In: 9:01:45 AM Scope Out: 9:14:55 AM Scope Withdrawal Time: 0 hours 10 minutes 7 seconds  Total Procedure Duration: 0 hours 13 minutes 10 seconds  Findings:                 A frond-like/villous and fungating non-obstructing                            medium-sized mass was found at the presumed hepatic                            flexure. The mass was soft, about 3-4cm across,                            biopsied extensively. Adjacent to the mass,                            separated by 1cm of normal mucosa is a 1cm                            pedunculated polyp that was not removed. See                            images. The distal edge of the mass/satelite polyp  was labeled with 3 submucosal injections of SPOT.                           Multiple small and large-mouthed diverticula were                            found in the left colon.                           The exam was otherwise without abnormality on                            direct and retroflexion views. Complications:            No immediate complications. Estimated blood loss:                            None. Estimated Blood Loss:     Estimated blood loss: none. Impression:               - A frond-like/villous and fungating                            non-obstructing medium-sized mass was found at the                            presumed hepatic flexure. The mass was soft, about                            3-4cm across, biopsied extensively. Adjacent to the                            mass, separated by 1cm of normal mucosa is a 1cm                             pedunculated polyp that was not removed. See                            images. The distal edge of the mass/satelite polyp                            was labeled with 3 submucosal injections of SPOT.                           - Diverticulosis in the left colon.                           - The examination was otherwise normal on direct                            and retroflexion views. Recommendation:           - Patient has a contact number available for  emergencies. The signs and symptoms of potential                            delayed complications were discussed with the                            patient. Return to normal activities tomorrow.                            Written discharge instructions were provided to the                            patient.                           - Resume previous diet.                           - Continue present medications.                           - Await pathology results (sent rush) Milus Banister, MD 07/15/2018 9:22:45 AM This report has been signed electronically.

## 2018-07-16 ENCOUNTER — Telehealth: Payer: Self-pay

## 2018-07-16 NOTE — Telephone Encounter (Signed)
Left message

## 2018-07-16 NOTE — Telephone Encounter (Signed)
  Follow up Call-  Call Dellene Mcgroarty number 07/15/2018  Post procedure Call Kairav Russomanno phone  # [phone]  Permission to leave phone message Yes  Some recent data might be hidden     Patient questions:  Do you have a fever, pain , or abdominal swelling? No. Pain Score  0 *  Have you tolerated food without any problems? Yes.    Have you been able to return to your normal activities? Yes.    Do you have any questions about your discharge instructions: Diet   No. Medications  No. Follow up visit  No.  Do you have questions or concerns about your Care? Yes.   Pt asked if pathology results were Stacye Noori. I informed at this time we do not have the results, but will contact her when we do. Pt verbalized understanding.     Actions: * If pain score is 4 or above: No action needed, pain <4.

## 2018-07-29 ENCOUNTER — Telehealth: Payer: Self-pay | Admitting: Gastroenterology

## 2018-07-29 NOTE — Telephone Encounter (Signed)
I called [facility] and they will call the pt and schedule her an appt.  I have notified the pt

## 2018-07-30 ENCOUNTER — Other Ambulatory Visit: Payer: Self-pay | Admitting: Surgery

## 2018-07-30 NOTE — Progress Notes (Unsigned)
Sierra Gray Documented: 07/30/2018 10:47 AM Location: [facility] Patient #: [phone] DOB: 03-Jun-1961 Undefined / Language: [name] / Race: White Female  History of Present Illness Sierra Gray Done MD; 07/30/2018 11:35 AM) The patient is a 57 year old female who presents with a colonic polyp. Sierra Gray is a 57 year old WF who has had a rough year from the standpoint of surgery. Last October she had a large ovarian cancer removed by Everitt Amber performed open at [facility]. She had a port placed by radiology and had postop chemotherapy by Dr. Alvy Bimler. She developed a midline incisional hernia and was referred by her primary care doctorwho happens to be in [place] to Dr. Launa Flight who did a ventral hernia repair with mesh on June 19. She recently had a screening colonoscopy by Dr. Ardis Hughs and this reveals a large frond-like mass in the hepatic flexure which he tattooed which by the limited path showed a tubular adenoma. This certainly wasn't definitive since it's a very large mass.  I discussed laparoscopically-assisted right hemicolectomy. This will be complicated somewhat by her recent midline incision in mesh placement. I described the procedure in some detail including mobilize the right colon will likely make a extraction site little more lateral to this hernia repair site. We'll go ahead and schedule sometime in September which would be about 3 months from her hernia repair.    Allergies Malachi Bonds, CMA; 07/30/2018 10:49 AM) BEE VENOM Anaphylaxis. Chlorhexidine Gluconate *ANTISEPTICS & DISINFECTANTS* Rash. PACLitaxel *ANTINEOPLASTICS AND ADJUNCTIVE THERAPIES* Bacitracin *ANTI-INFECTIVE AGENTS - MISC.* Iodine (Antiseptic) *ANTISEPTICS & DISINFECTANTS*  Medication History Malachi Bonds, CMA; 07/30/2018 10:51 AM) AmLODIPine Besylate (5MG  Tablet, Oral) Active. Anastrozole (1MG  Tablet, Oral) Active. Ascorbic Acid (1000MG  Tablet, Oral) Active. Biotin  (10000MCG Tablet, Oral) Active. Vitamin D3 [facility] Tablet, Oral) Active. Loratadine (10MG  Capsule, Oral) Active. Magnesium (500MG  Tablet, Oral) Active. Melatonin (3MG  Capsule, Oral) Active. Medications Reconciled    Vitals (Chemira Jones CMA; 07/30/2018 10:48 AM) 07/30/2018 10:48 AM Weight: 189.4 lb Height: 64in Body Surface Area: 1.91 m Body Mass Index: 32.51 kg/m  Pulse: 90 (Regular)  BP: 128/80 (Sitting, Left Arm, Standard)      Physical Exam (Jacalyn Biggs B. 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 We will do a colon prep. She is allergic to ChloraPrep and we'll need to use PCMX and the skin prep.  Matt B. Gray Done, MD, FACS

## 2018-09-02 ENCOUNTER — Inpatient Hospital Stay: Payer: BLUE CROSS/BLUE SHIELD | Attending: Hematology and Oncology

## 2018-09-02 DIAGNOSIS — Z90722 Acquired absence of ovaries, bilateral: Secondary | ICD-10-CM | POA: Diagnosis not present

## 2018-09-02 DIAGNOSIS — C562 Malignant neoplasm of left ovary: Secondary | ICD-10-CM | POA: Diagnosis not present

## 2018-09-02 DIAGNOSIS — Z9221 Personal history of antineoplastic chemotherapy: Secondary | ICD-10-CM | POA: Insufficient documentation

## 2018-09-03 LAB — CA 125: Cancer Antigen (CA) 125: 10.2 U/mL (ref 0.0–38.1)

## 2018-09-03 NOTE — Patient Instructions (Addendum)
Sierra Gray  09/03/2018   Your procedure is scheduled on: 09-11-18   Report to [facility]  Entrance    Report to Admitting at 5:30 AM    Call this number if you have problems the morning of surgery [phone]   Remember:DRINK 2 PRESURGERY ENSURE DRINKS THE NIGHT BEFORE SURGERY AT  1000 PM AND 1 PRESURGERY DRINK THE DAY OF THE PROCEDURE 3 HOURS PRIOR TO SCHEDULED SURGERY. NO SOLIDS AFTER MIDNIGHT THE DAY PRIOR TO THE SURGERY. NOTHING BY MOUTH EXCEPT CLEAR LIQUIDS UNTIL THREE HOURS PRIOR TO SCHEDULED SURGERY. PLEASE FINISH PRESURGERY ENSURE DRINK PER SURGEON ORDER 3 HOURS PRIOR TO SCHEDULED SURGERY TIME WHICH NEEDS TO BE COMPLETED AT 4:30 .     CLEAR LIQUID DIET   Foods Allowed                                                                     Foods Excluded  Coffee and tea, regular and decaf                             liquids that you cannot  Plain Jell-O in any flavor                                             see through such as: Fruit ices (not with fruit pulp)                                     milk, soups, orange juice  Iced Popsicles                                    All solid food Carbonated beverages, regular and diet                                    Cranberry, grape and apple juices Sports drinks like Gatorade Lightly seasoned clear broth or consume(fat free) Sugar, honey syrup  Sample Menu Breakfast                                Lunch                                     Supper Cranberry juice                    Beef broth                            Chicken broth Jell-O  Grape juice                           Apple juice Coffee or tea                        Jell-O                                      Popsicle                                                Coffee or tea                        Coffee or tea  _____________________________________________________________________    Take these  medicines the morning of surgery with A SIP OF WATER: Cetirizine (Zyrtec)               BRUSH YOUR TEETH MORNING OF SURGERY AND RINSE YOUR MOUTH OUT, NO CHEWING GUM CANDY OR MINTS.                 You may not have any metal on your body including hair pins and              piercings  Do not wear jewelry, make-up, lotions, powders or perfumes, deodorant             Do not wear nail polish.  Do not shave  48 hours prior to surgery.               Do not bring valuables to the hospital. [place].  Contacts, dentures or bridgework may not be worn into surgery.  Leave suitcase in the car. After surgery it may be brought to your room.     Special Instructions: Please bring your mask and tubing for your CPAP machine, and follow your prep instructions as provided by your surgeon.              Please read over the following fact sheets you were given: _____________________________________________________________________            [facility] - Preparing for Surgery Before surgery, you can play an important role.  Because skin is not sterile, your skin needs to be as free of germs as possible. Please follow these instructions carefully:  .  Shower with Your normal soap the night before surgery and the  morning of Surgery.  .  If you choose to wash your hair, wash your hair first as usual with your  normal  shampoo.            Wendee Copp thoroughly, paying special attention to the area where your surgery  will be performed.   Thoroughly rinse your body with warm water from the neck down.   Shower/wash with your normal soap                 Pat yourself dry with a clean towel.            .  Wear clean pajamas.           Marland Kitchen  Place clean sheets on your bed the night of your first shower and do not  sleep with pets. Day of Surgery : Do not apply any lotions/deodorants the morning of surgery.  Please wear clean clothes to the hospital/surgery  center.  FAILURE TO FOLLOW THESE INSTRUCTIONS MAY RESULT IN THE CANCELLATION OF YOUR SURGERY PATIENT SIGNATURE_________________________________  NURSE SIGNATURE__________________________________  ________________________________________________________________________   Adam Phenix  An incentive spirometer is a tool that can help keep your lungs clear and active. This tool measures how well you are filling your lungs with each breath. Taking long deep breaths may help reverse or decrease the chance of developing breathing (pulmonary) problems (especially infection) following:  A long period of time when you are unable to move or be active. BEFORE THE PROCEDURE   If the spirometer includes an indicator to show your best effort, your nurse or respiratory therapist will set it to a desired goal.  If possible, sit up straight or lean slightly forward. Try not to slouch.  Hold the incentive spirometer in an upright position. INSTRUCTIONS FOR USE  1. Sit on the edge of your bed if possible, or sit up as far as you can in bed or on a chair. 2. Hold the incentive spirometer in an upright position. 3. Breathe out normally. 4. Place the mouthpiece in your mouth and seal your lips tightly around it. 5. Breathe in slowly and as deeply as possible, raising the piston or the ball toward the top of the column. 6. Hold your breath for 3-5 seconds or for as long as possible. Allow the piston or ball to fall to the bottom of the column. 7. Remove the mouthpiece from your mouth and breathe out normally. 8. Rest for a few seconds and repeat Steps 1 through 7 at least 10 times every 1-2 hours when you are awake. Take your time and take a few normal breaths between deep breaths. 9. The spirometer may include an indicator to show your best effort. Use the indicator as a goal to work toward during each repetition. 10. After each set of 10 deep breaths, practice coughing to be sure your lungs are  clear. If you have an incision (the cut made at the time of surgery), support your incision when coughing by placing a pillow or rolled up towels firmly against it. Once you are able to get out of bed, walk around indoors and cough well. You may stop using the incentive spirometer when instructed by your caregiver.  RISKS AND COMPLICATIONS  Take your time so you do not get dizzy or light-headed.  If you are in pain, you may need to take or ask for pain medication before doing incentive spirometry. It is harder to take a deep breath if you are having pain. AFTER USE  Rest and breathe slowly and easily.  It can be helpful to keep track of a log of your progress. Your caregiver can provide you with a simple table to help with this. If you are using the spirometer at home, follow these instructions: [place] IF:   You are having difficultly using the spirometer.  You have trouble using the spirometer as often as instructed.  Your pain medication is not giving enough relief while using the spirometer.  You develop fever of 100.5 F (38.1 C) or higher. SEEK IMMEDIATE MEDICAL CARE IF:   You cough up bloody sputum that had not been present before.  You develop fever of 102 F (38.9 C) or greater.  You develop worsening pain at or near the incision site. MAKE SURE YOU:   Understand these instructions.  Will watch your condition.  Will get help right away if you are not doing well or get worse. Document Released: 04/07/2007 Document Revised: 02/17/2012 Document Reviewed: 06/08/2007 Exit[employer] Patient Information 2014 Exit[employer], [place].   ________________________________________________________________________  WHAT IS A BLOOD TRANSFUSION? Blood Transfusion Information  A transfusion is the replacement of blood or some of its parts. Blood is made up of multiple cells which provide different functions.  Red blood cells carry oxygen and are used for blood loss  replacement.  White blood cells fight against infection.  Platelets control bleeding.  Plasma helps clot blood.  Other blood products are available for specialized needs, such as hemophilia or other clotting disorders. BEFORE THE TRANSFUSION  Who gives blood for transfusions?   Healthy volunteers who are fully evaluated to make sure their blood is safe. This is blood bank blood. Transfusion therapy is the safest it has ever been in the practice of medicine. Before blood is taken from a donor, a complete history is taken to make sure that person has no history of diseases nor engages in risky social behavior (examples are intravenous drug use or sexual activity with multiple partners). The donor's travel history is screened to minimize risk of transmitting infections, such as malaria. The donated blood is tested for signs of infectious diseases, such as HIV and hepatitis. The blood is then tested to be sure it is compatible with you in order to minimize the chance of a transfusion reaction. If you or a relative donates blood, this is often done in anticipation of surgery and is not appropriate for emergency situations. It takes many days to process the donated blood. RISKS AND COMPLICATIONS Although transfusion therapy is very safe and saves many lives, the main dangers of transfusion include:   Getting an infectious disease.  Developing a transfusion reaction. This is an allergic reaction to something in the blood you were given. Every precaution is taken to prevent this. The decision to have a blood transfusion has been considered carefully by your caregiver before blood is given. Blood is not given unless the benefits outweigh the risks. AFTER THE TRANSFUSION  Right after receiving a blood transfusion, you will usually feel much better and more energetic. This is especially true if your red blood cells have gotten low (anemic). The transfusion raises the level of the red blood cells which  carry oxygen, and this usually causes an energy increase.  The nurse administering the transfusion will monitor you carefully for complications. HOME CARE INSTRUCTIONS  No special instructions are needed after a transfusion. You may find your energy is better. Speak with your caregiver about any limitations on activity for underlying diseases you may have. SEEK MEDICAL CARE IF:   Your condition is not improving after your transfusion.  You develop redness or irritation at the intravenous (IV) site. SEEK IMMEDIATE MEDICAL CARE IF:  Any of the following symptoms occur over the next 12 hours:  Shaking chills.  You have a temperature by mouth above 102 F (38.9 C), not controlled by medicine.  Chest, back, or muscle pain.  People around you feel you are not acting correctly or are confused.  Shortness of breath or difficulty breathing.  Dizziness and fainting.  You get a rash or develop hives.  You have a decrease in urine output.  Your urine turns a dark color or changes to pink, red, or brown. Any  of the following symptoms occur over the next 10 days:  You have a temperature by mouth above 102 F (38.9 C), not controlled by medicine.  Shortness of breath.  Weakness after normal activity.  The white part of the eye turns yellow (jaundice).  You have a decrease in the amount of urine or are urinating less often.  Your urine turns a dark color or changes to pink, red, or brown. Document Released: 11/22/2000 Document Revised: 02/17/2012 Document Reviewed: 07/11/2008 [facility] Patient Information 2014 [place], [place].  _______________________________________________________________________

## 2018-09-04 ENCOUNTER — Other Ambulatory Visit: Payer: Self-pay

## 2018-09-04 ENCOUNTER — Encounter (HOSPITAL_COMMUNITY): Payer: Self-pay

## 2018-09-04 ENCOUNTER — Encounter: Payer: Self-pay | Admitting: Gynecologic Oncology

## 2018-09-04 ENCOUNTER — Encounter (HOSPITAL_COMMUNITY)
Admission: RE | Admit: 2018-09-04 | Discharge: 2018-09-04 | Disposition: A | Discharge status: Home / Self Care | Payer: BLUE CROSS/BLUE SHIELD | Source: Ambulatory Visit | Attending: Surgery | Admitting: Surgery

## 2018-09-04 ENCOUNTER — Inpatient Hospital Stay (HOSPITAL_BASED_OUTPATIENT_CLINIC_OR_DEPARTMENT_OTHER): Payer: BLUE CROSS/BLUE SHIELD | Admitting: Gynecologic Oncology

## 2018-09-04 VITALS — BP 140/80 | HR 93 | Temp 98.7°F | Resp 20 | Ht 64.0 in | Wt 190.2 lb

## 2018-09-04 DIAGNOSIS — Z9221 Personal history of antineoplastic chemotherapy: Secondary | ICD-10-CM

## 2018-09-04 DIAGNOSIS — D126 Benign neoplasm of colon, unspecified: Secondary | ICD-10-CM | POA: Diagnosis not present

## 2018-09-04 DIAGNOSIS — I10 Essential (primary) hypertension: Secondary | ICD-10-CM | POA: Insufficient documentation

## 2018-09-04 DIAGNOSIS — Z01818 Encounter for other preprocedural examination: Secondary | ICD-10-CM | POA: Insufficient documentation

## 2018-09-04 DIAGNOSIS — C562 Malignant neoplasm of left ovary: Secondary | ICD-10-CM

## 2018-09-04 DIAGNOSIS — Z90722 Acquired absence of ovaries, bilateral: Secondary | ICD-10-CM

## 2018-09-04 LAB — BASIC METABOLIC PANEL
ANION GAP: 11 (ref 5–15)
BUN: 23 mg/dL — ABNORMAL HIGH (ref 6–20)
CALCIUM: 10.1 mg/dL (ref 8.9–10.3)
CHLORIDE: 105 mmol/L (ref 98–111)
CO2: 25 mmol/L (ref 22–32)
Creatinine, Ser: 0.95 mg/dL (ref 0.44–1.00)
GFR calc non Af Amer: >60 mL/min (ref >60)
Glucose, Bld: 94 mg/dL (ref 70–99)
POTASSIUM: 3.8 mmol/L (ref 3.5–5.1)
Sodium: 141 mmol/L (ref 135–145)

## 2018-09-04 LAB — CBC
HEMATOCRIT: 40.1 % (ref 36.0–46.0)
HEMOGLOBIN: 13.1 g/dL (ref 12.0–15.0)
MCH: 28.1 pg (ref 26.0–34.0)
MCHC: 32.7 g/dL (ref 30.0–36.0)
MCV: 86.1 fL (ref 78.0–100.0)
Platelets: 403 10*3/uL — ABNORMAL HIGH (ref 150–400)
RBC: 4.66 MIL/uL (ref 3.87–5.11)
RDW: 15.1 % (ref 11.5–15.5)
WBC: 9.2 10*3/uL (ref 4.0–10.5)

## 2018-09-04 LAB — HEMOGLOBIN A1C
HEMOGLOBIN A1C: 5.5 % (ref 4.8–5.6)
Mean Plasma Glucose: 111.15 mg/dL

## 2018-09-04 NOTE — Progress Notes (Signed)
Follow-up Note: Gyn-Onc  Sierra Gray 57 y.o. female  CC:  Chief Complaint  Patient presents with  . 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  S/p adjuvant chemotherapy with 6 cycles carboplatin and paclitaxel completed 01/29/18.   Complete clinical response.  Recommendation is for continued 3 monthly surveillance visits   Needs genetics assessment.   HPI: Patient was seen in consultation at the request of Dr. Nicoletta Gray.  Patient is a very pleasant 57 year old gravida 1 para 0 who in late August after having her [place] visit set up from the dental care and felt some abdominal discomfort. The pain increased and she subsequently that evening had a temperature of 102. This was corresponding with increased abdominal pain and then the abdominal pain dissipated. Since then she's had a occasional pain and last Monday. Due to the persistent discomfort she saw her primary care physician. She describes the pain as a occurring in both her right and left lower quadrants. She did not notice any upper abdominal discomfort. There's been no change in her bowel or bladder habits. She does endorse approximately a 20-25 pound weight loss in the last 6-9 months. Much of this has been intentional. An Thanksgiving of last year to the Christmas holiday she lost 10 pounds which was not intentional but since then her weight loss has been intentional and she feels that she's eating well.  She underwent a CT scan on 08/19/2017. It revealed a trace amount of ascites in the peritoneal cavity seen prior dominant weight in the lateral paracolic gutter and along the liver capsule. There was no nodule letter peritoneal enhancement identified to suggest peritoneal carcinomatosis. There really does state that there is a mass that is cystic and solid that replaces the entire uterus. It extends caudally to the cervix and  appears to invade the upper one third of the vagina. The caudal margin of the neoplasm made directly invading the posterior bladder dome to the medial thickness is normal. No fat separate the mass from the adjacent upper third of the rectum or from the mid sigmoid. The mass measures 12 x 13 x 20 cm in size and posteriorly and laterally displaces the right ovary. The left ovary was not competent to be identified. Of note the patient had undergone a laparoscopic hysterectomy at [facility] approximate 14 years ago secondary to fibroids.  She has not yet had tumor markers drawn. She states she's never had a colonoscopy. She's not sure if her last mammogram was in 2017 but she thinks she might be due this year. She does have family history of cancer her father died of pancreatic cancer in his late 12s. His maternal aunt with breast cancer in her 33s. A paternal grandfather died of colon cancer at 40. She does endorse allergies to chlorhexidine which causes a rash. This was an issue when she had knee surgery on the right. She believes Betadine is okay.  On 09/11/17 she underwent ex lap, BSO, omentectomy, pelvic and para-aortic lymphadenectomy, radical tumor debulking. Intraoperative findings were significant for a 20+cm left ovarian mass with nromal right ovary and omentum. No extraovarian disease was seen. Frozen confirmed adenocarcinoma.  There was no gross residual disease at completion of the surgery.  Final pathology confirmed a 20cm grade 1 endometrioid left ovarian adenocarcinoma with surface involvement but negative washings (stage IC2).  All other resected tissue (including omentum, nodes and peritoneal biopsy and washings) were negative.  In accordance with NCCN guidelines she was recommended adjuvant carboplatin and paclitaxel chemotherapy and her case was discussed at multi-disciplinary tumor conference.  She went on to complete adjuvant chemotherapy with 6 cycles of carboplatin and paclitaxel in  February, 2019. She tolerated therapy well and had a complete clinical response.   She presents today for follow-up. She had a ventral abdominal hernia for which she is had surgery at [facility] in June, 2019.  Interval Hx:  She has a transverse colon tumor with planned laparoscopic resection with Dr Hassell Done on 09/11/18.   CA 125 was normal at 10 on 08/31/18.   Review of Systems: Constitutional:  Denies fever.  Skin: no lesions or rash Cardiovascular: No chest pain, shortness of breath, or edema  Pulmonary: No cough or wheeze.  Gastro Intestinal: Reporting intermittent lower abdominal soreness.  No nausea, vomiting, constipation, or diarrhea reported. No bright red blood per rectum or change in bowel movement.  Genitourinary: Denies vaginal bleeding and discharge.  Musculoskeletal: no masses   Psychology: No complaints  Current Meds:  Outpatient Encounter Medications as of 09/04/2018  Medication Sig  . AMBULATORY NON FORMULARY MEDICATION Medication Name: Turmeric with ginger 500 mg tablet-1 tablet by mouth daily  . amLODipine (NORVASC) 5 MG tablet Take 5 mg by mouth every evening.  Marland Kitchen anastrozole (ARIMIDEX) 1 MG tablet Take 1 tablet (1 mg total) by mouth daily.  Marland Kitchen Apple Cider Vinegar 600 MG CAPS Take 600 mg by mouth 2 (two) times daily. Takes in morning and in evening.  . B Complex-C (SUPER B COMPLEX PO) Take 1 tablet by mouth daily.  . cetirizine (ZYRTEC) 10 MG tablet Take 10 mg by mouth daily.  . Cholecalciferol (VITAMIN D3) 25 MCG TABS Take 1,000 Units by mouth daily.   Marland Kitchen ibuprofen (ADVIL,MOTRIN) 200 MG tablet Take 400 mg by mouth every 6 (six) hours as needed.  . Inulin (FIBER CHOICE PO) Take 10 g by mouth daily.  . Liniments (SALONPAS PAIN RELIEF PATCH EX) Place 1 patch onto the skin daily as needed (knee pain).  . Magnesium Gluconate 250 MG TABS Take 250 mg by mouth daily.   . Melatonin 5 MG TABS Take 5 mg by mouth daily.   . Multiple Vitamins-Minerals (HAIR SKIN & NAILS ADVANCED PO)  Take 1 tablet by mouth daily.  . Turmeric 500 MG TABS Take 500 mg by mouth daily.  Marland Kitchen ZINC-VITAMIN C PO Take 1,000 mg by mouth daily.   Facility-Administered Encounter Medications as of 09/04/2018  Medication  . 0.9 %  sodium chloride infusion    Allergy:  Allergies  Allergen Reactions  . Bee Venom Anaphylaxis  . Chlorhexidine Rash    Surgical prep wash  . Paclitaxel Other (See Comments)    Experienced severe back pain and flushing  . Other     Ortho prep  . Bacitracin Rash  . Iodine Rash    Social Hx:   Social History   Socioeconomic History  . Marital status: Single    Spouse name: Not on file  . Number of children: Not on file  . Years of education: Not on file  . Highest education level: Not on file  Occupational History  . Occupation: Freight forwarder  Social Needs  . Financial resource strain: Not on file  . Food insecurity:    Worry: Not on file    Inability: Not on file  . Transportation needs:    Medical: Not on file    Non-medical: Not on file  Tobacco Use  .  Smoking status: Never Smoker  . Smokeless tobacco: Never Used  Substance and Sexual Activity  . Alcohol use: Yes    Comment: Socially drinks wine/beer  . Drug use: No  . Sexual activity: Never  Lifestyle  . Physical activity:    Days per week: Not on file    Minutes per session: Not on file  . Stress: Not on file  Relationships  . Social connections:    Talks on phone: Not on file    Gets together: Not on file    Attends religious service: Not on file    Active member of club or organization: Not on file    Attends meetings of clubs or organizations: Not on file    Relationship status: Not on file  . Intimate partner violence:    Fear of current or ex partner: Not on file    Emotionally abused: Not on file    Physically abused: Not on file    Forced sexual activity: Not on file  Other Topics Concern  . Not on file  Social History Narrative  . Not on file    Past Surgical Hx:  Past  Surgical History:  Procedure Laterality Date  . ABDOMINAL HYSTERECTOMY  2009   Total laparoscopic hyst for fibroids  . BILATERAL SALPINGECTOMY Bilateral 09/11/2017   Procedure: BILATERAL SALPINGECTOMY WITH OOPHERECTOMY;  Surgeon: Everitt Amber, MD;  Location: [facility];  Service: Gynecology;  Laterality: Bilateral;  . BREAST BIOPSY  12/16/2014   2 lumps in  right breast removed  . BREAST LUMPECTOMY Left   . EXCISION OF SKIN TAG N/A 09/11/2017   Procedure: EXCISION OF SKIN TAG ON THIGH;  Surgeon: Everitt Amber, MD;  Location: [facility];  Service: Gynecology;  Laterality: N/A;  . IR FLUORO GUIDE PORT INSERTION RIGHT  11/03/2017  . IR REMOVAL TUN ACCESS W/ PORT W/O FL MOD SED  03/17/2018  . IR US GUIDE VASC ACCESS RIGHT  11/03/2017  . KNEE SURGERY Right    x3  . LAPAROTOMY Bilateral 09/11/2017   Procedure: EXPLORATORY LAPAROTOMY;  Surgeon: Everitt Amber, MD;  Location: [facility];  Service: Gynecology;  Laterality: Bilateral;  . LAPAROTOMY WITH STAGING N/A 09/11/2017   Procedure: PELVIC AND PARA AORTIC LYMPH NODE  DISSECTION;  Surgeon: Everitt Amber, MD;  Location: [facility];  Service: Gynecology;  Laterality: N/A;  . OMENTECTOMY N/A 09/11/2017   Procedure: OMENTECTOMY;  Surgeon: Everitt Amber, MD;  Location: [facility];  Service: Gynecology;  Laterality: N/A;  . REDUCTION MAMMAPLASTY    . WISDOM TOOTH EXTRACTION      Past Medical Hx:  Past Medical History:  Diagnosis Date  . Anemia    hx of  . Arthritis    oa knees  . Breast lump in female    benign  . Family history of pancreatic cancer 11/27/2017  . Family history of prostate cancer 11/27/2017  . Fibroids   . Hyperlipidemia   . Hypertension   . Migraine    hx of   . Ovarian cancer ([place])   . Pelvic mass in female   . Pelvic mass in female   . PONV (postoperative nausea and vomiting)    nausea only  . 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 Soft tissue, biopsy, right medial thigh MATURE LIPOMA 3. Ovary and fallopian tube, right ENDOMETRIOMA AND SIMPLE SEROUS CYST WITH FALLOPIAN TUBE ADHESION NEGATIVE FOR MALIGNANCY 4. Omentum, resection for tumor BENIGN OMENTUM NEGATIVE FOR CARCINOMA 5. Lymph nodes, regional resection, right pelvic EIGHT BENIGN LYMPH NODES (0/8) 6. Lymph nodes, regional resection, left pelvic SEVEN BENIGN LYMPH NODES (0/7) 7. Lymph node, biopsy, right para-aortic FOUR BENIGN LYMPH NODES (0/4) 8. Lymph node, biopsy, left para-aortic ONE BENIGN LYMPH NODE (0/1) 9. Peritoneum, biopsy, left abdominal BENIGN FIBROMUSCULAR TISSUE 10. Peritoneum, biopsy, right abdominal BENIGN FIBROMUSCULAR TISSUE WITH SEROSITIS 11. Peritoneum, biopsy, pelvic FIBROADIPOSE TISSUE WITH SEROSITIS  Specimen(s): Ovary and fallopian tube Procedure: (including lymph node sampling): salpingo-oophorectomy Primary tumor site (including laterality): Left ovary Ovarian surface involvement: Yes Ovarian capsule intact without fragmentation: intact Maximum tumor size (cm): 20.0 cm Histologic type: Endometrioid carcinoma Grade: 1 Peritoneal implants: (specify invasive or non-invasive): Negative Pelvic extension (list additional structures on separate lines and if involved): Negative Lymph nodes: number examined 20 ; number positive 0 TNM code: pT1c2, pN0, pMx FIGO Stage (based on pathologic findings, needs clinical correlation): IC2    09/11/2017 Surgery     Preoperative Diagnosis: 1. left adnexal mass.  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   Procedure(s) Performed: 1. Exploratory laparotomy with bilateral salpingo-oophorectomy, pelvic and para-aortic lymph node dissection, omentectomy and radical debulking for ovarian cancer (CPT [phone])  Surgeon: Everitt Amber, M.D. Operative Findings:20 cm left ovarian mass.   No intraperitoneal rupture occurred;  Frozen pathology was consistent with adenocarcinoma; right tube and ovary normal in appearance; 3) normal bilateral pelvic and para-aortic lymph nodes and omentum; small and large bowel to palpation. This represented an optimal cytoreduction with no gross visible disease remaining.     10/09/2017 Tumor Marker    Patient's tumor was tested for the following markers: CA-125 Results of the tumor marker test revealed 68.7    10/15/2017 Adverse Reaction    She developed reaction to Paclitaxel.    10/15/2017 - 01/29/2018 Chemotherapy    She received carboplatin. Due to infusion reaction to Taxol with cycle 1, treatment was switched to carboplatin and Abraxane    11/03/2017 Procedure    Successful placement of a right internal jugular approach power injectable Port-A-Cath. The catheter is ready for immediate use.    11/27/2017 Tumor Marker    Patient's tumor was tested for the following markers: CA-125 Results of the tumor marker test revealed 27.7    12/12/2017 Genetic Testing    The patient had genetic testing due to a personal history of ovarian cancer and a family history of pancreatic cancer (and possibly breast cancer).  The [facility] Hereditary Cancer Panel + tumor HRD analysis was ordered from the laboratory [facility]. The [facility] gene panel offered by [employer] includes sequencing and deletion/duplication testing of the following 29 genes: APC, ATM, BARD1, BMPR1A, BRCA1, BRCA2, BRIP1, CHD1, CDK4, CDKN2A, CHEK2, EPCAM (large rearrangement  only), HOXB13, MLH1, MSH2, MSH6, MUTYH, NBN, PALB2, PMS2, PTEN, RAD51C, RAD51D, SMAD4, STK11, and TP53. Sequencing was performed for select regions of POLE and POLD1, and large rearrangement  analysis was performed for select regions of GREM1.  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 [id number] (p.[id number]).  The date of this test report is 12/12/2017.    01/08/2018 Tumor Marker    Patient's tumor was tested for the following markers: CA-125 Results of the tumor marker test revealed 17.5    03/02/2018 Tumor Marker    Patient's tumor was tested for the following markers: CA-125 Results of the tumor marker test revealed 11.4    03/14/2018 Imaging    MRCP 1. No worrisome pancreatic lesions are currently present. 2. Cholelithiasis. 3. Small benign-appearing bilateral renal cysts. 4. Midline anterior abdominal wall laxity seen on coronal images, potential small hernia in this vicinity without observed complication    0/12/69 Procedure    Successful right IJ vein Port-A-Cath explant.     Family Hx:  Family History  Problem Relation Age of Onset  . Hypertension Mother   . Hyperlipidemia Mother   . Pancreatic cancer Father 38       died 20 months after dx  . Hypertension Father        questionable  . Prostate cancer Father 5       had surgery for it  . Breast cancer Maternal Aunt        dx 69's- she had surgery, think it was cancer but could have been just a lump removed  . Heart disease Maternal Aunt   . Lung cancer Maternal Aunt        primary  . Heart attack Maternal Grandmother   . Diabetes Maternal Grandfather   . Dementia Maternal Grandfather   . Osteoporosis Paternal Grandmother   . Colon cancer Paternal Grandfather 60  . Stomach cancer Neg Hx   . Esophageal cancer Neg Hx     Vitals:  Blood pressure 140/80, pulse 93, temperature 98.7 F (37.1 C), temperature source Oral, resp. rate 20, height '5\' 4"'$  (1.626 m), weight 190 lb 3.2 oz (86.3 kg),  SpO2 100 %.  Physical Exam: Well-nourished well-developed female in no acute distress.  Neck: Supple, no lymphadenopathy, no thyromegaly.  Lungs: Clear to auscultation bilaterally.  Cardiac: Regular rate and rhythm.  Abdomen: Obese, soft, nontender, nondistended. The incision exhibits erythema which is blanching near to incision. No drainage or collections appreciated.  Groins: No lymphadenopathy.  Extremities no edema.  Pelvic: deferred   Thereasa Solo, MD 09/04/2018, 2:53 PM

## 2018-09-04 NOTE — Patient Instructions (Signed)
Please notify Dr Denman George at phone number [phone] if you notice vaginal bleeding, new pelvic or abdominal pains, bloating, feeling full easy, or a change in bladder or bowel function.   Please return to see Dr Denman George in December, 2019.

## 2018-09-10 MED ORDER — BUPIVACAINE LIPOSOME 1.3 % IJ SUSP
20.0000 mL | Freq: Once | INTRAMUSCULAR | Status: DC
  Filled 2018-09-10: qty 20

## 2018-09-11 ENCOUNTER — Inpatient Hospital Stay (HOSPITAL_COMMUNITY)
Admission: RE | Admit: 2018-09-11 | Discharge: 2018-09-14 | DRG: 331 | Disposition: A | Discharge status: Home / Self Care | Payer: BLUE CROSS/BLUE SHIELD | Source: Ambulatory Visit | Attending: Surgery | Admitting: Surgery

## 2018-09-11 ENCOUNTER — Encounter (HOSPITAL_COMMUNITY)
Admission: RE | Disposition: A | Discharge status: Home / Self Care | Payer: Self-pay | Source: Ambulatory Visit | Attending: Surgery

## 2018-09-11 ENCOUNTER — Inpatient Hospital Stay (HOSPITAL_COMMUNITY): Payer: BLUE CROSS/BLUE SHIELD | Admitting: Anesthesiology

## 2018-09-11 ENCOUNTER — Other Ambulatory Visit: Payer: Self-pay

## 2018-09-11 ENCOUNTER — Encounter (HOSPITAL_COMMUNITY): Payer: Self-pay

## 2018-09-11 DIAGNOSIS — Z888 Allergy status to other drugs, medicaments and biological substances status: Secondary | ICD-10-CM

## 2018-09-11 DIAGNOSIS — E669 Obesity, unspecified: Secondary | ICD-10-CM | POA: Diagnosis present

## 2018-09-11 DIAGNOSIS — D649 Anemia, unspecified: Secondary | ICD-10-CM | POA: Diagnosis present

## 2018-09-11 DIAGNOSIS — I10 Essential (primary) hypertension: Secondary | ICD-10-CM | POA: Diagnosis present

## 2018-09-11 DIAGNOSIS — D122 Benign neoplasm of ascending colon: Principal | ICD-10-CM | POA: Diagnosis present

## 2018-09-11 DIAGNOSIS — Z91030 Bee allergy status: Secondary | ICD-10-CM | POA: Diagnosis not present

## 2018-09-11 DIAGNOSIS — Z9049 Acquired absence of other specified parts of digestive tract: Secondary | ICD-10-CM

## 2018-09-11 DIAGNOSIS — G4730 Sleep apnea, unspecified: Secondary | ICD-10-CM | POA: Diagnosis present

## 2018-09-11 DIAGNOSIS — Z6832 Body mass index (BMI) 32.0-32.9, adult: Secondary | ICD-10-CM

## 2018-09-11 HISTORY — PX: LAPAROSCOPIC PARTIAL RIGHT COLECTOMY: SHX5913

## 2018-09-11 LAB — TYPE AND SCREEN
ABO/RH(D): O POS
Antibody Screen: NEGATIVE

## 2018-09-11 LAB — CBC
HCT: 35.9 % — ABNORMAL LOW (ref 36.0–46.0)
Hemoglobin: 11.7 g/dL — ABNORMAL LOW (ref 12.0–15.0)
MCH: 28.3 pg (ref 26.0–34.0)
MCHC: 32.6 g/dL (ref 30.0–36.0)
MCV: 86.9 fL (ref 78.0–100.0)
Platelets: 360 10*3/uL (ref 150–400)
RBC: 4.13 MIL/uL (ref 3.87–5.11)
RDW: 15.2 % (ref 11.5–15.5)
WBC: 16.7 10*3/uL — AB (ref 4.0–10.5)

## 2018-09-11 LAB — CREATININE, SERUM
CREATININE: 1.06 mg/dL — AB (ref 0.44–1.00)
GFR calc non Af Amer: 58 mL/min — ABNORMAL LOW (ref >60)

## 2018-09-11 SURGERY — LAPAROSCOPIC PARTIAL RIGHT COLECTOMY
Anesthesia: General | Site: Abdomen | Laterality: Right

## 2018-09-11 MED ORDER — ALBUTEROL SULFATE HFA 108 (90 BASE) MCG/ACT IN AERS
INHALATION_SPRAY | RESPIRATORY_TRACT | Status: DC | PRN
  Administered 2018-09-11: 3 via RESPIRATORY_TRACT

## 2018-09-11 MED ORDER — FENTANYL CITRATE (PF) 100 MCG/2ML IJ SOLN
INTRAMUSCULAR | Status: AC
  Filled 2018-09-11: qty 2

## 2018-09-11 MED ORDER — LIDOCAINE 20MG/ML (2%) 15 ML SYRINGE OPTIME
INTRAMUSCULAR | Status: DC | PRN
  Administered 2018-09-11: 1.5 mg/kg/h via INTRAVENOUS

## 2018-09-11 MED ORDER — EPHEDRINE SULFATE-NACL 50-0.9 MG/10ML-% IV SOSY
PREFILLED_SYRINGE | INTRAVENOUS | Status: DC | PRN
  Administered 2018-09-11 (×3): 10 mg via INTRAVENOUS

## 2018-09-11 MED ORDER — CELECOXIB 200 MG PO CAPS
200.0000 mg | ORAL_CAPSULE | ORAL | Status: AC
  Administered 2018-09-11: 200 mg via ORAL
  Filled 2018-09-11: qty 1

## 2018-09-11 MED ORDER — ROCURONIUM BROMIDE 100 MG/10ML IV SOLN
INTRAVENOUS | Status: DC | PRN
  Administered 2018-09-11: 30 mg via INTRAVENOUS
  Administered 2018-09-11: 5 mg via INTRAVENOUS
  Administered 2018-09-11: 50 mg via INTRAVENOUS
  Administered 2018-09-11: 30 mg via INTRAVENOUS

## 2018-09-11 MED ORDER — ONDANSETRON HCL 4 MG/2ML IJ SOLN: 4.0000 mg | Freq: Four times a day (QID) | INTRAMUSCULAR | Status: DC | PRN

## 2018-09-11 MED ORDER — PROMETHAZINE HCL 25 MG/ML IJ SOLN: 6.2500 mg | INTRAMUSCULAR | Status: DC | PRN

## 2018-09-11 MED ORDER — SODIUM CHLORIDE 0.9 % IV SOLN
2.0000 g | Freq: Two times a day (BID) | INTRAVENOUS | Status: AC
  Administered 2018-09-11: 2 g via INTRAVENOUS
  Filled 2018-09-11: qty 2

## 2018-09-11 MED ORDER — HYDROMORPHONE HCL 1 MG/ML IJ SOLN: 0.5000 mg | INTRAMUSCULAR | Status: DC | PRN

## 2018-09-11 MED ORDER — DIPHENHYDRAMINE HCL 50 MG/ML IJ SOLN
INTRAMUSCULAR | Status: DC | PRN
  Administered 2018-09-11: 12.5 mg via INTRAVENOUS

## 2018-09-11 MED ORDER — ROCURONIUM BROMIDE 10 MG/ML (PF) SYRINGE
PREFILLED_SYRINGE | INTRAVENOUS | Status: AC
  Filled 2018-09-11: qty 10

## 2018-09-11 MED ORDER — HEPARIN SODIUM (PORCINE) 5000 UNIT/ML IJ SOLN
5000.0000 [IU] | Freq: Three times a day (TID) | INTRAMUSCULAR | Status: DC
  Administered 2018-09-11 – 2018-09-14 (×9): 5000 [IU] via SUBCUTANEOUS
  Filled 2018-09-11 (×9): qty 1

## 2018-09-11 MED ORDER — MIDAZOLAM HCL 2 MG/2ML IJ SOLN
INTRAMUSCULAR | Status: AC
  Filled 2018-09-11: qty 2

## 2018-09-11 MED ORDER — AMLODIPINE BESYLATE 5 MG PO TABS
5.0000 mg | ORAL_TABLET | Freq: Every evening | ORAL | Status: DC
  Administered 2018-09-11 – 2018-09-13 (×3): 5 mg via ORAL
  Filled 2018-09-11 (×3): qty 1

## 2018-09-11 MED ORDER — FENTANYL CITRATE (PF) 250 MCG/5ML IJ SOLN
INTRAMUSCULAR | Status: AC
  Filled 2018-09-11: qty 5

## 2018-09-11 MED ORDER — LACTATED RINGERS IR SOLN
Status: DC | PRN
  Administered 2018-09-11: 1000 mL

## 2018-09-11 MED ORDER — ALBUMIN HUMAN 5 % IV SOLN
INTRAVENOUS | Status: DC | PRN
  Administered 2018-09-11: 08:00:00 via INTRAVENOUS

## 2018-09-11 MED ORDER — PHENYLEPHRINE 40 MCG/ML (10ML) SYRINGE FOR IV PUSH (FOR BLOOD PRESSURE SUPPORT)
PREFILLED_SYRINGE | INTRAVENOUS | Status: DC | PRN
  Administered 2018-09-11: 80 ug via INTRAVENOUS
  Administered 2018-09-11: 120 ug via INTRAVENOUS
  Administered 2018-09-11: 80 ug via INTRAVENOUS
  Administered 2018-09-11: 120 ug via INTRAVENOUS

## 2018-09-11 MED ORDER — ALVIMOPAN 12 MG PO CAPS
12.0000 mg | ORAL_CAPSULE | ORAL | Status: AC
  Administered 2018-09-11: 12 mg via ORAL
  Filled 2018-09-11: qty 1

## 2018-09-11 MED ORDER — ONDANSETRON HCL 4 MG PO TABS: 4.0000 mg | ORAL_TABLET | Freq: Four times a day (QID) | ORAL | Status: DC | PRN

## 2018-09-11 MED ORDER — PROPOFOL 10 MG/ML IV BOLUS
INTRAVENOUS | Status: AC
  Filled 2018-09-11: qty 20

## 2018-09-11 MED ORDER — SCOPOLAMINE 1 MG/3DAYS TD PT72
MEDICATED_PATCH | TRANSDERMAL | Status: AC
  Filled 2018-09-11: qty 1

## 2018-09-11 MED ORDER — FENTANYL CITRATE (PF) 100 MCG/2ML IJ SOLN
25.0000 ug | INTRAMUSCULAR | Status: DC | PRN
  Administered 2018-09-11 (×2): 50 ug via INTRAVENOUS

## 2018-09-11 MED ORDER — ALVIMOPAN 12 MG PO CAPS
12.0000 mg | ORAL_CAPSULE | Freq: Two times a day (BID) | ORAL | Status: DC
  Administered 2018-09-12 (×2): 12 mg via ORAL
  Filled 2018-09-11 (×2): qty 1

## 2018-09-11 MED ORDER — KETAMINE HCL 10 MG/ML IJ SOLN
INTRAMUSCULAR | Status: DC | PRN
  Administered 2018-09-11: 10 mg via INTRAVENOUS
  Administered 2018-09-11: 40 mg via INTRAVENOUS
  Administered 2018-09-11: 10 mg via INTRAVENOUS

## 2018-09-11 MED ORDER — ALBUTEROL SULFATE HFA 108 (90 BASE) MCG/ACT IN AERS
INHALATION_SPRAY | RESPIRATORY_TRACT | Status: AC
  Filled 2018-09-11: qty 6.7

## 2018-09-11 MED ORDER — SACCHAROMYCES BOULARDII 250 MG PO CAPS
250.0000 mg | ORAL_CAPSULE | Freq: Two times a day (BID) | ORAL | Status: DC
  Administered 2018-09-11 – 2018-09-13 (×6): 250 mg via ORAL
  Filled 2018-09-11 (×6): qty 1

## 2018-09-11 MED ORDER — KETAMINE HCL 10 MG/ML IJ SOLN
INTRAMUSCULAR | Status: AC
  Filled 2018-09-11: qty 1

## 2018-09-11 MED ORDER — METOPROLOL TARTRATE 5 MG/5ML IV SOLN: 5.0000 mg | Freq: Four times a day (QID) | INTRAVENOUS | Status: DC | PRN

## 2018-09-11 MED ORDER — POLYETHYLENE GLYCOL 3350 17 GM/SCOOP PO POWD: 1.0000 | Freq: Once | ORAL | Status: DC

## 2018-09-11 MED ORDER — LIDOCAINE HCL (CARDIAC) PF 100 MG/5ML IV SOSY
PREFILLED_SYRINGE | INTRAVENOUS | Status: DC | PRN
  Administered 2018-09-11: 80 mg via INTRAVENOUS

## 2018-09-11 MED ORDER — SCOPOLAMINE 1 MG/3DAYS TD PT72
1.0000 | MEDICATED_PATCH | Freq: Once | TRANSDERMAL | Status: AC
  Administered 2018-09-11: 1 via TRANSDERMAL

## 2018-09-11 MED ORDER — SUGAMMADEX SODIUM 200 MG/2ML IV SOLN
INTRAVENOUS | Status: DC | PRN
  Administered 2018-09-11: 200 mg via INTRAVENOUS

## 2018-09-11 MED ORDER — METRONIDAZOLE 500 MG PO TABS: 1000.0000 mg | ORAL_TABLET | ORAL | Status: DC

## 2018-09-11 MED ORDER — SODIUM CHLORIDE 0.9 % IV SOLN
2.0000 g | INTRAVENOUS | Status: AC
  Administered 2018-09-11: 2 g via INTRAVENOUS
  Filled 2018-09-11: qty 2

## 2018-09-11 MED ORDER — DEXAMETHASONE SODIUM PHOSPHATE 10 MG/ML IJ SOLN
INTRAMUSCULAR | Status: AC
  Filled 2018-09-11: qty 1

## 2018-09-11 MED ORDER — FENTANYL CITRATE (PF) 100 MCG/2ML IJ SOLN
INTRAMUSCULAR | Status: DC | PRN
  Administered 2018-09-11 (×5): 50 ug via INTRAVENOUS

## 2018-09-11 MED ORDER — NEOMYCIN SULFATE 500 MG PO TABS: 1000.0000 mg | ORAL_TABLET | ORAL | Status: DC

## 2018-09-11 MED ORDER — MIDAZOLAM HCL 5 MG/5ML IJ SOLN
INTRAMUSCULAR | Status: DC | PRN
  Administered 2018-09-11: 2 mg via INTRAVENOUS

## 2018-09-11 MED ORDER — 0.9 % SODIUM CHLORIDE (POUR BTL) OPTIME
TOPICAL | Status: DC | PRN
  Administered 2018-09-11: 1000 mL

## 2018-09-11 MED ORDER — ANASTROZOLE 1 MG PO TABS
1.0000 mg | ORAL_TABLET | Freq: Every day | ORAL | Status: DC
  Administered 2018-09-11 – 2018-09-13 (×3): 1 mg via ORAL
  Filled 2018-09-11 (×4): qty 1

## 2018-09-11 MED ORDER — BUPIVACAINE LIPOSOME 1.3 % IJ SUSP
INTRAMUSCULAR | Status: DC | PRN
  Administered 2018-09-11: 20 mL

## 2018-09-11 MED ORDER — HYDROCODONE-ACETAMINOPHEN 5-325 MG PO TABS
1.0000 | ORAL_TABLET | ORAL | Status: DC | PRN
  Administered 2018-09-12: 1 via ORAL
  Filled 2018-09-11: qty 1

## 2018-09-11 MED ORDER — KCL IN DEXTROSE-NACL 20-5-0.45 MEQ/L-%-% IV SOLN
INTRAVENOUS | Status: DC
  Administered 2018-09-11 – 2018-09-13 (×4): via INTRAVENOUS
  Filled 2018-09-11 (×5): qty 1000

## 2018-09-11 MED ORDER — HYDRALAZINE HCL 20 MG/ML IJ SOLN: 10.0000 mg | INTRAMUSCULAR | Status: DC | PRN

## 2018-09-11 MED ORDER — ONDANSETRON HCL 4 MG/2ML IJ SOLN
INTRAMUSCULAR | Status: AC
  Filled 2018-09-11: qty 2

## 2018-09-11 MED ORDER — ONDANSETRON HCL 4 MG/2ML IJ SOLN
INTRAMUSCULAR | Status: DC | PRN
  Administered 2018-09-11: 4 mg via INTRAVENOUS

## 2018-09-11 MED ORDER — GABAPENTIN 300 MG PO CAPS
300.0000 mg | ORAL_CAPSULE | ORAL | Status: AC
  Administered 2018-09-11: 300 mg via ORAL
  Filled 2018-09-11: qty 1

## 2018-09-11 MED ORDER — DEXAMETHASONE SODIUM PHOSPHATE 10 MG/ML IJ SOLN
INTRAMUSCULAR | Status: DC | PRN
  Administered 2018-09-11: 10 mg via INTRAVENOUS

## 2018-09-11 MED ORDER — PROPOFOL 10 MG/ML IV BOLUS
INTRAVENOUS | Status: DC | PRN
  Administered 2018-09-11: 150 mg via INTRAVENOUS

## 2018-09-11 MED ORDER — LACTATED RINGERS IV SOLN
INTRAVENOUS | Status: DC
  Administered 2018-09-11 (×3): via INTRAVENOUS

## 2018-09-11 MED ORDER — LIDOCAINE 2% (20 MG/ML) 5 ML SYRINGE
INTRAMUSCULAR | Status: AC
  Filled 2018-09-11: qty 5

## 2018-09-11 MED ORDER — ACETAMINOPHEN 500 MG PO TABS
1000.0000 mg | ORAL_TABLET | ORAL | Status: AC
  Administered 2018-09-11: 1000 mg via ORAL
  Filled 2018-09-11: qty 2

## 2018-09-11 SURGICAL SUPPLY — 64 items
ADH SKN CLS APL DERMABOND .7 (GAUZE/BANDAGES/DRESSINGS) ×1
APPLIER CLIP 5 13 M/L LIGAMAX5 (MISCELLANEOUS)
APPLIER CLIP ROT 10 11.4 M/L (STAPLE)
APR CLP MED LRG 11.4X10 (STAPLE)
APR CLP MED LRG 5 ANG JAW (MISCELLANEOUS)
BLADE EXTENDED COATED 6.5IN (ELECTRODE) IMPLANT
CABLE HIGH FREQUENCY MONO STRZ (ELECTRODE) ×1 IMPLANT
CELLS DAT CNTRL 66122 CELL SVR (MISCELLANEOUS) ×1 IMPLANT
CLIP APPLIE 5 13 M/L LIGAMAX5 (MISCELLANEOUS) IMPLANT
CLIP APPLIE ROT 10 11.4 M/L (STAPLE) IMPLANT
COUNTER NEEDLE 20 DBL MAG RED (NEEDLE) ×3 IMPLANT
COVER MAYO STAND STRL (DRAPES) ×9 IMPLANT
COVER SURGICAL LIGHT HANDLE (MISCELLANEOUS) ×3 IMPLANT
DECANTER SPIKE VIAL GLASS SM (MISCELLANEOUS) ×3 IMPLANT
DERMABOND ADVANCED (GAUZE/BANDAGES/DRESSINGS) ×2
DERMABOND ADVANCED .7 DNX12 (GAUZE/BANDAGES/DRESSINGS) IMPLANT
DRAPE LAPAROSCOPIC ABDOMINAL (DRAPES) ×3 IMPLANT
DRSG OPSITE POSTOP 4X10 (GAUZE/BANDAGES/DRESSINGS) IMPLANT
DRSG OPSITE POSTOP 4X6 (GAUZE/BANDAGES/DRESSINGS) ×2 IMPLANT
DRSG OPSITE POSTOP 4X8 (GAUZE/BANDAGES/DRESSINGS) IMPLANT
ELECT REM PT RETURN 15FT ADLT (MISCELLANEOUS) ×3 IMPLANT
GAUZE SPONGE 4X4 12PLY STRL (GAUZE/BANDAGES/DRESSINGS) IMPLANT
GLOVE BIOGEL M 8.0 STRL (GLOVE) ×6 IMPLANT
GOWN STRL REUS W/TWL XL LVL3 (GOWN DISPOSABLE) ×18 IMPLANT
HANDLE STAPLE EGIA 4 XL (STAPLE) ×2 IMPLANT
LIGASURE IMPACT 36 18CM CVD LR (INSTRUMENTS) ×2 IMPLANT
PACK COLON (CUSTOM PROCEDURE TRAY) ×3 IMPLANT
PAD POSITIONING PINK XL (MISCELLANEOUS) ×3 IMPLANT
PORT LAP GEL ALEXIS MED 5-9CM (MISCELLANEOUS) IMPLANT
POSITIONER SURGICAL ARM (MISCELLANEOUS) ×2 IMPLANT
RELOAD EGIA 60 MED/THCK PURPLE (STAPLE) ×9 IMPLANT
RELOAD STAPLE 60 MED/THCK ART (STAPLE) IMPLANT
RETRACTOR WND ALEXIS 18 MED (MISCELLANEOUS) IMPLANT
RTRCTR WOUND ALEXIS 18CM MED (MISCELLANEOUS) ×3
SCISSORS LAP 5X45 EPIX DISP (ENDOMECHANICALS) ×3 IMPLANT
SET IRRIG TUBING LAPAROSCOPIC (IRRIGATION / IRRIGATOR) ×3 IMPLANT
SLEEVE XCEL OPT CAN 5 100 (ENDOMECHANICALS) ×8 IMPLANT
SPONGE LAP 18X18 RF (DISPOSABLE) ×2 IMPLANT
STAPLER VISISTAT 35W (STAPLE) ×3 IMPLANT
SUT CHROMIC 3 0 SH 27 (SUTURE) IMPLANT
SUT MNCRL AB 4-0 PS2 18 (SUTURE) ×4 IMPLANT
SUT NOVA 1 T20/GS 25DT (SUTURE) ×6 IMPLANT
SUT PDS AB 1 CTX 36 (SUTURE) ×4 IMPLANT
SUT PDS AB 1 TP1 96 (SUTURE) IMPLANT
SUT PDS AB 4-0 SH 27 (SUTURE) IMPLANT
SUT PROLENE 2 0 KS (SUTURE) IMPLANT
SUT SILK 2 0 (SUTURE) ×3
SUT SILK 2 0 SH CR/8 (SUTURE) ×3 IMPLANT
SUT SILK 2-0 18XBRD TIE 12 (SUTURE) ×1 IMPLANT
SUT SILK 3 0 (SUTURE) ×3
SUT SILK 3 0 SH CR/8 (SUTURE) ×3 IMPLANT
SUT SILK 3-0 18XBRD TIE 12 (SUTURE) ×1 IMPLANT
SUT VIC AB 4-0 SH 18 (SUTURE) ×3 IMPLANT
SYS LAPSCP GELPORT 120MM (MISCELLANEOUS)
SYSTEM LAPSCP GELPORT 120MM (MISCELLANEOUS) IMPLANT
TOWEL OR NON WOVEN STRL DISP B (DISPOSABLE) ×3 IMPLANT
TRAY FOLEY MTR SLVR 14FR STAT (SET/KITS/TRAYS/PACK) ×2 IMPLANT
TRAY FOLEY MTR SLVR 16FR STAT (SET/KITS/TRAYS/PACK) IMPLANT
TROCAR BLADELESS OPT 5 100 (ENDOMECHANICALS) ×5 IMPLANT
TROCAR XCEL NON-BLD 11X100MML (ENDOMECHANICALS) IMPLANT
TUBING CONNECTING 10 (TUBING) ×2 IMPLANT
TUBING CONNECTING 10' (TUBING) ×2
TUBING INSUF HEATED (TUBING) ×3 IMPLANT
YANKAUER SUCT BULB TIP NO VENT (SUCTIONS) ×2 IMPLANT

## 2018-09-11 NOTE — Anesthesia Preprocedure Evaluation (Addendum)
Anesthesia Evaluation  Patient identified by MRN, date of birth, ID band Patient awake    Reviewed: Allergy & Precautions, NPO status , Patient's Chart, lab work & pertinent test results  History of Anesthesia Complications (+) PONV and history of anesthetic complications  Airway Mallampati: III  TM Distance: >3 FB Neck ROM: Full    Dental  (+) Dental Advisory Given, Chipped, Caps   Pulmonary sleep apnea and Continuous Positive Airway Pressure Ventilation ,    Pulmonary exam normal breath sounds clear to auscultation       Cardiovascular hypertension, Pt. 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  Reproductive/Obstetrics (+) Pregnancy                            Anesthesia Physical Anesthesia Plan  ASA: II  Anesthesia Plan: Spinal   Post-op Pain Management:    Induction:   PONV Risk Score and Plan: 4 or greater and Diphenhydramine, Scopolamine patch - Pre-op, Midazolam, Dexamethasone and Ondansetron  Airway Management Planned: Oral ETT  Additional Equipment:   Intra-op Plan:   Post-operative Plan: Extubation in OR  Informed Consent: I have reviewed the patients History and Physical, chart, labs and discussed the procedure including the risks, benefits and alternatives for the proposed anesthesia with the patient or authorized representative who has indicated his/her understanding and acceptance.   Dental advisory given  Plan Discussed with: CRNA,  Anesthesiologist and Surgeon  Anesthesia Plan Comments: (2nd IV after induction.)        Anesthesia Quick Evaluation

## 2018-09-11 NOTE — Interval H&P Note (Signed)
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 After consideration of risks, benefits and other options for treatment, the patient has consented to  Procedure(s): [place] (Right) as a surgical intervention .  The patient's history has been reviewed, patient examined, no change in status, stable for surgery.  I have reviewed the patient's chart and labs.  Questions were answered to the patient's satisfaction.     Sierra Gray

## 2018-09-11 NOTE — Progress Notes (Signed)
Patient was transferred to [place] at 1250. Alert and oriented x 4. Incision is clean, no bleeding.

## 2018-09-11 NOTE — Op Note (Signed)
Sierra Gray  06/29/1961 11 September 2018    PCP:  Nicoletta Dress, MD   Surgeon: Kaylyn Lim, MD, FACS  Asst:  Alphonsa Overall, MD, FACS  Anes:  general  Preop Dx: Right colon mass (tubular adenoma) 1 year after TAH/BSO by Dr. Denman George and 3 months after ventral incisional hernia repair Postop Dx: same  Procedure: Laparoscopic assisted right hemicolectomy Location Surgery: [facility] 1 Complications: None noted  EBL:   20 cc  Drains: none  Description of Procedure:  The patient was taken to OR 1 .  After anesthesia was administered and the patient was prepped  with Technicare and a timeout was performed.  Technique care was used instead of ChloraPrep because she has had allergic reactions to ChloraPrep.  Access to the abdomen was achieved to the right upper quadrant with a 5 mm Optiview without difficulty.  Following insufflation I was impressed that there were just a couple of adhesions to the anterior abdominal wall.  There were some adhesions of the colon in the pelvis where Dr. Denman George performed that surgery but there was nothing suspicious noted.  I did lyse all of these adhesions sharply.  I was then able to place the trochars over on the right side and operated primarily through those 2 grafts the right colon and identify the area that had been tattooed.  This was where it was advertised by Dr. Oretha Caprice.  Using the harmonic scalpel the right colon was mobilized.  A transverse incision was made lateral to where the hernia repair had been performed.  The Alexis wound protector was inserted and the colon was brought out through this incision.  It was divided in the terminal ileum with the vidian Endo GIA using a 6 cm cartridge purple and with the same the transverse colon was divided distal to the mass.  The mesentery was divided with the LigaSure and there was a venous tributary that was oversewn with 3-0 silk suture ligature in figure-of-eight fashion..  Interrupted 3-0 Vicryl's were then used to  line up the mesenteric defect and a side-to-side anastomosis was performed opening along the anterior and mesenteric borders and inserting the 6 cm purple load Covidien Endo GIA creating common channel.  The common defect was then closed with 2 layers using 3 oh silks and a running canal 4-0 PDS on the inside and Lembert sutures of 3 oh silks on the outside.  Protocol followed and gowns and gloves were changed.  Everything looked good.  I closed the small transverse incision multiple layers using a combination of running 0 PDS in the back row and then interrupted #1 Novafil burying the knots.  Irrigated and he also injected 15 cc of Exparel full-strength.  The remaining portion was used in the 3 remaining trochars.  I then insufflated with gas and looked back and and the closure looked good with no evidence of any hemorrhage or any other issues.  4-0 Monocryl used on the low trocar incisions and 4-0 Monocryl was used to line up the transverse incision which was then closed with staples.  Dermabond used on the trocar sites and the honeycomb dressing on the transverse incision.  The patient tolerated the procedure well and was taken to the PACU in stable condition.     Matt B. Hassell Done, [place], [facility], [place]

## 2018-09-11 NOTE — H&P (Signed)
[name] Documented: 07/30/2018 10:47 AM Location: [place] Office Patient #: [phone] DOB: 08/05/1961 Undefined / Language: [name] / Race: White Female   History of Present Illness Rodman Key B. Hassell Done MD; 07/30/2018 11:35 AM) The patient is a 57 year old female who presents with a colonic polyp. Adrienna Karis is a 57 year old WF who has had a rough year from the standpoint of surgery. Last October she had a large ovarian cancer removed by Everitt Amber performed open at [facility]. She had a port placed by radiology and had postop chemotherapy by Dr. Alvy Bimler. She developed a midline incisional hernia and was referred by her primary care doctorwho happens to be in [place] to Dr. Launa Flight who did a ventral hernia repair with mesh on June 19. She recently had a screening colonoscopy by Dr. Ardis Hughs and this reveals a large frond-like mass in the hepatic flexure which he tattooed which by the limited path showed a tubular adenoma. This certainly wasn't definitive since it's a very large mass.  I discussed laparoscopically-assisted right hemicolectomy. This will be complicated somewhat by her recent midline incision in mesh placement. I described the procedure in some detail including mobilize the right colon will likely make a extraction site little more lateral to this hernia repair site. We'll go ahead and schedule sometime in September which would be about 3 months from her hernia repair.    Allergies Malachi Bonds, CMA; 07/30/2018 10:49 AM) BEE VENOM  Anaphylaxis. Chlorhexidine Gluconate *ANTISEPTICS & DISINFECTANTS*  Rash. PACLitaxel *ANTINEOPLASTICS AND ADJUNCTIVE THERAPIES*  Bacitracin *ANTI-INFECTIVE AGENTS - MISC.*  Iodine (Antiseptic) *ANTISEPTICS & DISINFECTANTS*   Medication History Malachi Bonds, CMA; 07/30/2018 10:51 AM) AmLODIPine Besylate (5MG  Tablet, Oral) Active. Anastrozole (1MG  Tablet, Oral) Active. Ascorbic Acid (1000MG  Tablet, Oral)  Active. Biotin (10000MCG Tablet, Oral) Active. Vitamin D3 [facility] Tablet, Oral) Active. Loratadine (10MG  Capsule, Oral) Active. Magnesium (500MG  Tablet, Oral) Active. Melatonin (3MG  Capsule, Oral) Active. Medications Reconciled  Vitals (Chemira Jones CMA; 07/30/2018 10:48 AM) 07/30/2018 10:48 AM Weight: 189.4 lb Height: 64in Body Surface Area: 1.91 m Body Mass Index: 32.51 kg/m  Pulse: 90 (Regular)  BP: 128/80 (Sitting, Left Arm, Standard)       Physical Exam (Adryan Druckenmiller B. Hassell Done MD; 07/30/2018 11:36 AM) General Note: WDWNWF NAD HEENT unremarkable Neck supple Chest prior chemoport on the right; lungs clear Heart SR Abdomen; midline incision with healing skin Ext FROM     Assessment & Plan Rodman Key B. 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 We will do a colon prep. She is allergic to ChloraPrep and we'll need to use PCMX and the skin prep.

## 2018-09-11 NOTE — Anesthesia Procedure Notes (Signed)
Procedure Name: Intubation Date/Time: 09/11/2018 7:36 AM Performed by: Anne Fu, CRNA Pre-anesthesia Checklist: Patient identified, Emergency Drugs available, Suction available, Patient being monitored and Timeout performed Patient Re-evaluated:Patient Re-evaluated prior to induction Oxygen Delivery Method: Circle system utilized Preoxygenation: Pre-oxygenation with 100% oxygen Induction Type: IV induction Ventilation: Mask ventilation without difficulty and Oral airway inserted - appropriate to patient size Laryngoscope Size: Mac and 3 Grade View: Grade I Tube type: Oral Tube size: 7.0 mm Number of attempts: 1 Airway Equipment and Method: Patient positioned with wedge pillow and Stylet Placement Confirmation: ETT inserted through vocal cords under direct vision,  positive ETCO2 and breath sounds checked- equal and bilateral Secured at: 23 cm Tube secured with: Tape Dental Injury: Teeth and Oropharynx as per pre-operative assessment  Comments: Intubated by Viann Fish, SRNA

## 2018-09-11 NOTE — Transfer of Care (Signed)
Immediate Anesthesia Transfer of Care Note  Patient: Sierra Gray  Procedure(s) Performed: Procedure(s): LAPAROSCOPIC ASSISTED  PARTIAL RIGHT HEMI-COLECTOMY ERAS PATHWAY (Right)  Patient Location: PACU  Anesthesia Type:General  Level of Consciousness:  sedated, patient cooperative and responds to stimulation  Airway & Oxygen Therapy:Patient Spontanous Breathing and Patient connected to face mask oxgen  Post-op Assessment:  Report given to PACU RN and Post -op Vital signs reviewed and stable  Post vital signs:  Reviewed and stable  Last Vitals:  Vitals:   09/11/18 0551 09/11/18 1039  BP: (!) 124/92 115/71  Pulse: 88 86  Resp: 16 10  Temp: 37.1 C   SpO2: 397% 95%    Complications: No apparent anesthesia complications

## 2018-09-11 NOTE — Anesthesia Postprocedure Evaluation (Signed)
Anesthesia Post Note  Patient: Sierra Gray  Procedure(s) Performed: LAPAROSCOPIC ASSISTED  PARTIAL RIGHT HEMI-COLECTOMY ERAS PATHWAY (Right Abdomen)     Patient location during evaluation: PACU Anesthesia Type: General Level of consciousness: awake and alert, oriented and awake Pain management: pain level controlled Vital Signs Assessment: post-procedure vital signs reviewed and stable Respiratory status: spontaneous breathing, nonlabored ventilation and respiratory function stable Cardiovascular status: blood pressure returned to baseline and stable Postop Assessment: no apparent nausea or vomiting Anesthetic complications: no    Last Vitals:  Vitals:   09/11/18 1115 09/11/18 1130  BP: 123/76 123/71  Pulse: 78 85  Resp: 20 17  Temp:    SpO2: 94% 97%    Last Pain:  Vitals:   09/11/18 1145  TempSrc:   PainSc: 3                  Catalina Gravel

## 2018-09-12 ENCOUNTER — Encounter (HOSPITAL_COMMUNITY): Payer: Self-pay | Admitting: Surgery

## 2018-09-12 LAB — BASIC METABOLIC PANEL
Anion gap: 12 (ref 5–15)
BUN: 9 mg/dL (ref 6–20)
CHLORIDE: 104 mmol/L (ref 98–111)
CO2: 27 mmol/L (ref 22–32)
CREATININE: 0.97 mg/dL (ref 0.44–1.00)
Calcium: 9.5 mg/dL (ref 8.9–10.3)
GFR calc Af Amer: >60 mL/min (ref >60)
GFR calc non Af Amer: >60 mL/min (ref >60)
GLUCOSE: 116 mg/dL — AB (ref 70–99)
Potassium: 4.1 mmol/L (ref 3.5–5.1)
SODIUM: 143 mmol/L (ref 135–145)

## 2018-09-12 LAB — CBC
HEMATOCRIT: 34.4 % — AB (ref 36.0–46.0)
Hemoglobin: 11.1 g/dL — ABNORMAL LOW (ref 12.0–15.0)
MCH: 27.9 pg (ref 26.0–34.0)
MCHC: 32.3 g/dL (ref 30.0–36.0)
MCV: 86.4 fL (ref 78.0–100.0)
PLATELETS: 343 10*3/uL (ref 150–400)
RBC: 3.98 MIL/uL (ref 3.87–5.11)
RDW: 15.4 % (ref 11.5–15.5)
WBC: 13 10*3/uL — ABNORMAL HIGH (ref 4.0–10.5)

## 2018-09-12 NOTE — Progress Notes (Signed)
RT placed patient on CPAP. Patient is tolerating well. 

## 2018-09-12 NOTE — Progress Notes (Signed)
1 Day Post-Op lap R colectomy Subjective: No nausea, pain controlled.  Sitting up in a chair.  No flatus  Objective: Vital signs in last 24 hours: Temp:  [97.7 F (36.5 C)-99.6 F (37.6 C)] 99.6 F (37.6 C) (10/05 0542) Pulse Rate:  [63-86] 75 (10/05 0542) Resp:  [15-27] 15 (10/05 0542) BP: (107-134)/(67-86) 130/85 (10/05 0542) SpO2:  [94 %-100 %] 98 % (10/05 0542)   Intake/Output from previous day: 10/04 0701 - 10/05 0700 In: 4450 [P.O.:300; I.V.:3900; IV Piggyback:250] Out: 0034 [Urine:3060; Blood:50] Intake/Output this shift: Total I/O In: 0  Out: 300 [Urine:300]   General appearance: alert and cooperative GI: normal findings: soft, nondistended  Incision: no significant drainage  Lab Results:  Recent Labs    09/11/18 1259 09/12/18 0347  WBC 16.7* 13.0*  HGB 11.7* 11.1*  HCT 35.9* 34.4*  PLT 360 343   BMET Recent Labs    09/11/18 1259 09/12/18 0347  NA  --  143  K  --  4.1  CL  --  104  CO2  --  27  GLUCOSE  --  116*  BUN  --  9  CREATININE 1.06* 0.97  CALCIUM  --  9.5   PT/INR No results for input(s): LABPROT, INR in the last 72 hours. ABG No results for input(s): PHART, HCO3 in the last 72 hours.  Invalid input(s): PCO2, PO2  MEDS, Scheduled . alvimopan  12 mg Oral BID  . amLODipine  5 mg Oral QPM  . anastrozole  1 mg Oral Daily  . heparin injection (subcutaneous)  5,000 Units Subcutaneous Q8H  . saccharomyces boulardii  250 mg Oral BID    Studies/Results: No results found.  Assessment: s/p Procedure(s): LAPAROSCOPIC ASSISTED  PARTIAL RIGHT HEMI-COLECTOMY ERAS PATHWAY Patient Active Problem List   Diagnosis Date Noted  . S/P right colectomy Oct 2019 09/11/2018  . Abdominal wall hernia 03/02/2018  . Pancytopenia, acquired ([place]) 01/08/2018  . 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 Genetic testing 12/19/2017  . 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 Anemia due to  antineoplastic chemotherapy 12/18/2017  . Other constipation 11/27/2017  . Family history of pancreatic cancer 11/27/2017  . Family history of prostate cancer 11/27/2017  . Paresthesia of right leg 11/05/2017  . Physical debility 11/05/2017  . 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

## 2018-09-13 NOTE — Progress Notes (Signed)
2 Days Post-Op lap R colectomy Subjective: No nausea, pain controlled.  Having BM's  Objective: Vital signs in last 24 hours: Temp:  [98.4 F (36.9 C)-98.9 F (37.2 C)] 98.9 F (37.2 C) (10/06 0533) Pulse Rate:  [66-79] 79 (10/06 0533) Resp:  [16] 16 (10/06 0533) BP: (129-155)/(83-86) 155/86 (10/06 0533) SpO2:  [96 %-100 %] 100 % (10/06 0533)   Intake/Output from previous day: 10/05 0701 - 10/06 0700 In: 1761 [P.O.:300; I.V.:938] Out: 4850 [Urine:4850] Intake/Output this shift: No intake/output data recorded.   General appearance: alert and cooperative GI: normal findings: soft, nondistended  Incision: no significant drainage  Lab Results:  Recent Labs    09/11/18 1259 09/12/18 0347  WBC 16.7* 13.0*  HGB 11.7* 11.1*  HCT 35.9* 34.4*  PLT 360 343   BMET Recent Labs    09/11/18 1259 09/12/18 0347  NA  --  143  K  --  4.1  CL  --  104  CO2  --  27  GLUCOSE  --  116*  BUN  --  9  CREATININE 1.06* 0.97  CALCIUM  --  9.5   PT/INR No results for input(s): LABPROT, INR in the last 72 hours. ABG No results for input(s): PHART, HCO3 in the last 72 hours.  Invalid input(s): PCO2, PO2  MEDS, Scheduled . amLODipine  5 mg Oral QPM  . anastrozole  1 mg Oral Daily  . heparin injection (subcutaneous)  5,000 Units Subcutaneous Q8H  . saccharomyces boulardii  250 mg Oral BID    Studies/Results: No results found.  Assessment: s/p Procedure(s): LAPAROSCOPIC ASSISTED  PARTIAL RIGHT HEMI-COLECTOMY ERAS PATHWAY Patient Active Problem List   Diagnosis Date Noted  . S/P right colectomy Oct 2019 09/11/2018  . Abdominal wall hernia 03/02/2018  . Pancytopenia, acquired ([place]) 01/08/2018  . 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 Genetic testing 12/19/2017  . 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 Anemia due to antineoplastic chemotherapy 12/18/2017  . Other constipation 11/27/2017  . Family history  of pancreatic cancer 11/27/2017  . Family history of prostate cancer 11/27/2017  . Paresthesia of right leg 11/05/2017  . Physical debility 11/05/2017  . 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

## 2018-09-14 NOTE — Discharge Summary (Signed)
Physician Discharge Summary  Patient ID: Sierra Gray MRN: [id number] DOB/AGE: 02-09-1961 57 y.o.  PCP: Nicoletta Dress, MD  Admit date: 09/11/2018 Discharge date: 09/14/2018  Admission Diagnoses:  Large polyp in the hepatic flexure of the colon  Discharge Diagnoses: Large tubular adenoma of the colon (>5 cm) with no evidence of malignancy Principal Problem:   S/P right colectomy Oct 2019   Surgery:  Lap assisted right hemicolectomy  Discharged Condition: improved  Hospital Course:   Had surgery on Friday and started clears and advanced.  Ready for discharge on Monday  Consults: none  Significant Diagnostic Studies: final path was all benign--no evidence of malignancy with 31 nodes and tubular adenoma (no cancer)    Discharge Exam: Blood pressure 118/78, pulse 86, temperature 98.1 F (36.7 C), temperature source Oral, resp. rate 16, height 5\' 4"  (1.626 m), weight 86.2 kg, SpO2 96 %. Incisions are ok  Disposition:    Allergies as of 09/14/2018      Reactions   Bee Venom Anaphylaxis   Chlorhexidine Rash   Surgical prep wash   Paclitaxel Other (See Comments)   Experienced severe back pain and flushing   Other    Ortho prep   Bacitracin Rash   Iodine Rash      Medication List    TAKE these medications   AMBULATORY NON FORMULARY MEDICATION Medication Name: Turmeric with ginger 500 mg tablet-1 tablet by mouth daily   amLODipine 5 MG tablet Commonly known as:  NORVASC Take 5 mg by mouth every evening.   anastrozole 1 MG tablet Commonly known as:  ARIMIDEX Take 1 tablet (1 mg total) by mouth daily.   Apple Cider Vinegar 600 MG Caps Take 600 mg by mouth 2 (two) times daily. Takes in morning and in evening.   cetirizine 10 MG tablet Commonly known as:  ZYRTEC Take 10 mg by mouth daily.   FIBER CHOICE PO Take 10 g by mouth daily.   HAIR SKIN & NAILS ADVANCED PO Take 1 tablet by mouth daily.   ibuprofen 200 MG tablet Commonly known as:   ADVIL,MOTRIN Take 400 mg by mouth every 6 (six) hours as needed.   Magnesium Gluconate 250 MG Tabs Take 250 mg by mouth daily.   Melatonin 5 MG Tabs Take 5 mg by mouth daily.   SALONPAS PAIN RELIEF PATCH EX Place 1 patch onto the skin daily as needed (knee pain).   SUPER B COMPLEX PO Take 1 tablet by mouth daily.   Turmeric 500 MG Tabs Take 500 mg by mouth daily.   Vitamin D3 25 MCG tablet Commonly known as:  Vitamin D Take 1,000 Units by mouth daily.   ZINC-VITAMIN C PO Take 1,000 mg by mouth daily.      Follow-up Information    Johnathan Hausen, MD. Schedule an appointment as soon as possible for a visit.   Specialty:  General Surgery Why:  remove staples no later than next Monday Contact information: [address] [place] [address] [phone]           Signed: Pedro Earls 09/14/2018, 7:50 AM

## 2018-09-14 NOTE — Discharge Instructions (Signed)
Open Colectomy An open colectomy is surgery to remove part or all of the large intestine (colon). This procedure may be used to treat several conditions, including:  Inflammation and infection of the colon (diverticulitis).  Tumors or masses in the colon.  Inflammatory bowel disease, such as Crohn disease or ulcerative colitis.  Bleeding from the colon.  Blockage or obstruction of the colon.  Tell a health care provider about:  Any allergies you have.  All medicines you are taking, including vitamins, herbs, eye drops, creams, and over-the-counter medicines.  Any problems you or family members have had with anesthetic medicines.  Any blood disorders you have.  Any surgeries you have had.  Any medical conditions you have.  Whether you are pregnant or may be pregnant.  Whether you smoke or use tobacco products. These can affect your body's reaction to anesthesia. What are the risks? Generally, this is a safe procedure. However, problems may occur, including:  Infection.  Bleeding.  Allergic reactions to medicines.  Damage to other structures or organs.  Pneumonia.  The incision opening up.  Tissues from inside the abdomen bulging through the incision (hernia).  Reopening of the colon where it was stitched or stapled together.  A blood clot forming in a vein and traveling to the lungs.  Future blockage of the small intestine from scar tissue.  What happens before the procedure? Staying hydrated Follow instructions from your health care provider about hydration, which may include:  Up to 2 hours before the procedure - you may continue to drink clear liquids, such as water, clear fruit juice, black coffee, and plain tea.  Eating and drinking restrictions Follow instructions from your health care provider about eating and drinking, which may include:  8 hours before the procedure - stop eating heavy meals or foods such as meat, fried foods, or fatty foods.  6  hours before the procedure - stop eating light meals or foods, such as toast or cereal.  6 hours before the procedure - stop drinking milk or drinks that contain milk.  2 hours before the procedure - stop drinking clear liquids.  Bowel prep In some cases, you may be prescribed an oral bowel prep to clean out your colon. If so:  Take it as told by your health care provider. Starting the day before your procedure, you may need to drink a large amount of medicated liquid. The liquid will cause you to have multiple loose stools until your stool is almost clear or light green.  Follow instructions from your health care provider about eating and drinking restrictions during bowel prep.  Medicines  Ask your health care provider about: ? Changing or stopping your regular medicines or vitamins. This is especially important if you are taking diabetes medicines, blood thinners, or vitamin E. ? Taking medicines such as aspirin and ibuprofen. These medicines can thin your blood. Do not take these medicines before your procedure if your health care provider instructs you not to.  If you were prescribed an antibiotic medicine, take it as told by your health care provider. General instructions  Bring loose-fitting, comfortable clothing and slip-on shoes that you can put on without bending over.  Make sure to see your health care provider for any tests that you need before the procedure, such as: ? Blood tests. ? A test to check the heart's rhythm (electrocardiogram, ECG). ? A CT scan of your abdomen. ? Urine tests. ? Colonoscopy.  Plan to have someone take you home from the   hospital or clinic.  Arrange for someone to help you with your activities during your recovery. What happens during the procedure?  To reduce your risk of infection: ? Your health care team will wash or sanitize their hands. ? Your skin will be washed with soap. ? Hair may be removed from the surgical area.  An IV tube  will be inserted into one of your veins. The tube will be used to give you medicines and fluids.  You will be given a medicine to make you fall asleep (general anesthetic). You may also be given a medicine to help you relax (sedative).  Small monitors will be connected to your body. They will be used to check your heart, blood pressure, and oxygen level.  A breathing tube may be placed into your lungs during the procedure.  A thin, flexible tube (catheter) will be placed into your bladder to drain urine.  A tube may be inserted through your nose and into your stomach (nasogastric tube, or NG tube). The tube is used to remove stomach fluids after surgery until the intestines start working again.  An incision will be made in your abdomen.  Clamps or staples will be put on your colon.  The part of the colon between the clamps or staples will be removed.  The ends of the colon that remain will be stitched or stapled together.  The incision in your abdomen will be closed with stitches (sutures) or staples.  The incision will be covered with a bandage (dressing).  A small opening (stoma) may be created in your lower abdomen. A removable, external pouch (ostomy pouch) will be attached to the stoma. This pouch will collect stool outside of your body. Stool passes through the stoma and into the pouch instead of through your anus. The procedure may vary among health care providers and hospitals. What happens after the procedure?  Your blood pressure, heart rate, breathing rate, and blood oxygen level will be monitored until the medicines you were given have worn off.  You may continue to receive fluids and medicines through an IV tube.  You will start on a clear liquid diet and gradually go back to a normal diet.  Do not drive until your health care provider approves.  You may have some pain in your abdomen. You will be given pain medicine to control the pain.  You will be encouraged to  do the following: ? Do breathing exercises to prevent pneumonia. ? Get up and start walking within a day after surgery. You should try to get up 5-6 times a day. This information is not intended to replace advice given to you by your health care provider. Make sure you discuss any questions you have with your health care provider. Document Released: 09/22/2009 Document Revised: 08/26/2016 Document Reviewed: 08/26/2016 Elsevier Interactive Patient Education  2018 [employer]  

## 2018-09-14 NOTE — Progress Notes (Signed)
Pt was discharged home today. Instructions were reviewed with patient, and questions were answered. Pt was taken to main entrance via wheelchair by NT.  

## 2018-10-14 ENCOUNTER — Telehealth: Payer: Self-pay | Admitting: Hematology and Oncology

## 2018-10-14 NOTE — Telephone Encounter (Signed)
NG out 11/12 - moved f/u to [place]. Spoke with patient.

## 2018-10-20 ENCOUNTER — Other Ambulatory Visit: Payer: Self-pay | Admitting: Adult Health

## 2018-10-20 ENCOUNTER — Telehealth: Payer: Self-pay | Admitting: Adult Health

## 2018-10-20 ENCOUNTER — Inpatient Hospital Stay: Payer: BLUE CROSS/BLUE SHIELD | Attending: Hematology and Oncology | Admitting: Adult Health

## 2018-10-20 ENCOUNTER — Encounter: Payer: Self-pay | Admitting: Adult Health

## 2018-10-20 VITALS — BP 127/87 | HR 86 | Temp 98.5°F | Resp 18 | Ht 64.0 in | Wt 192.1 lb

## 2018-10-20 DIAGNOSIS — Z1231 Encounter for screening mammogram for malignant neoplasm of breast: Secondary | ICD-10-CM

## 2018-10-20 DIAGNOSIS — C562 Malignant neoplasm of left ovary: Secondary | ICD-10-CM | POA: Diagnosis present

## 2018-10-20 DIAGNOSIS — Z1501 Genetic susceptibility to malignant neoplasm of breast: Secondary | ICD-10-CM

## 2018-10-20 DIAGNOSIS — Z1589 Genetic susceptibility to other disease: Secondary | ICD-10-CM

## 2018-10-20 DIAGNOSIS — Z1509 Genetic susceptibility to other malignant neoplasm: Secondary | ICD-10-CM

## 2018-10-20 DIAGNOSIS — E2839 Other primary ovarian failure: Secondary | ICD-10-CM

## 2018-10-20 NOTE — Assessment & Plan Note (Signed)
Last CA125 10.2 on 09/02/2018, she has completed adjuvant chemotherapy and will continue to f/u with Dr. Denman George as scheduled.

## 2018-10-20 NOTE — Progress Notes (Signed)
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 Soft tissue, biopsy, right medial thigh MATURE LIPOMA 3. Ovary and fallopian tube, right ENDOMETRIOMA AND SIMPLE SEROUS CYST WITH FALLOPIAN TUBE ADHESION NEGATIVE FOR MALIGNANCY 4. Omentum, resection for tumor BENIGN OMENTUM NEGATIVE FOR CARCINOMA 5. Lymph nodes, regional resection, right pelvic EIGHT BENIGN LYMPH NODES (0/8) 6. Lymph nodes, regional resection, left pelvic SEVEN BENIGN LYMPH NODES (0/7) 7. Lymph node, biopsy, right para-aortic FOUR BENIGN LYMPH NODES (0/4) 8. Lymph node, biopsy, left para-aortic ONE BENIGN LYMPH NODE (0/1) 9. Peritoneum, biopsy, left abdominal BENIGN FIBROMUSCULAR TISSUE 10. Peritoneum, biopsy, right abdominal BENIGN FIBROMUSCULAR TISSUE WITH SEROSITIS 11. Peritoneum, biopsy, pelvic FIBROADIPOSE TISSUE WITH SEROSITIS  Specimen(s): Ovary and fallopian tube Procedure: (including lymph node sampling): salpingo-oophorectomy Primary tumor site (including laterality): Left ovary Ovarian  surface involvement: Yes Ovarian capsule intact without fragmentation: intact Maximum tumor size (cm): 20.0 cm Histologic type: Endometrioid carcinoma Grade: 1 Peritoneal implants: (specify invasive or non-invasive): Negative Pelvic extension (list additional structures on separate lines and if involved): Negative Lymph nodes: number examined 20 ; number positive 0 TNM code: pT1c2, pN0, pMx FIGO Stage (based on pathologic findings, needs clinical correlation): IC2    09/11/2017 Surgery    Preoperative Diagnosis: 1. left adnexal mass.  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   Procedure(s) Performed: 1. Exploratory laparotomy with bilateral salpingo-oophorectomy, pelvic and para-aortic lymph node dissection, omentectomy and radical debulking for ovarian cancer (CPT [phone])  Surgeon: Everitt Amber, M.D. Operative Findings:20 cm left ovarian mass.   No intraperitoneal rupture occurred;  Frozen pathology was consistent with adenocarcinoma; right tube and ovary normal in appearance; 3) normal bilateral pelvic and para-aortic lymph nodes and omentum; small and large bowel to palpation. This represented an optimal cytoreduction with no gross visible disease remaining.     10/09/2017 Tumor Marker    Patient's tumor was tested for the following markers: CA-125 Results of the tumor marker test revealed 68.7    10/15/2017 Adverse Reaction    She developed reaction to Paclitaxel.    10/15/2017 - 01/29/2018 Chemotherapy    She received carboplatin. Due to infusion reaction to Taxol with cycle 1, treatment was switched to carboplatin and Abraxane    11/03/2017 Procedure    Successful placement of a right internal jugular approach power injectable Port-A-Cath. The catheter is ready for immediate use.    11/27/2017 Tumor Marker    Patient's tumor was tested for the following markers: CA-125 Results of the tumor marker test revealed 27.7    12/12/2017  Genetic Testing  The patient had genetic testing due to a personal history of ovarian cancer and a family history of pancreatic cancer (and possibly breast cancer).  The [facility] Hereditary Cancer Panel + tumor HRD analysis was ordered from the laboratory [facility]. The [facility] gene panel offered by [employer] includes sequencing and deletion/duplication testing of the following 29 genes: APC, ATM, BARD1, BMPR1A, BRCA1, BRCA2, BRIP1, CHD1, CDK4, CDKN2A, CHEK2, EPCAM (large rearrangement only), HOXB13, MLH1, MSH2, MSH6, MUTYH, NBN, PALB2, PMS2, PTEN, RAD51C, RAD51D, SMAD4, STK11, and TP53. Sequencing was performed for select regions of POLE and POLD1, and large rearrangement analysis was performed for select regions of GREM1.  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 [id number] (p.[id number]).  The date of this test report is 12/12/2017.    01/08/2018 Tumor Marker    Patient's tumor was tested for the following markers: CA-125 Results of the tumor marker test revealed 17.5    03/02/2018 Tumor Marker    Patient's tumor was tested for the following markers: CA-125 Results of the tumor marker test revealed 11.4    03/14/2018 Imaging    MRCP 1. No worrisome pancreatic lesions are currently present. 2. Cholelithiasis. 3. Small benign-appearing bilateral renal cysts. 4. Midline anterior abdominal wall laxity seen on coronal images, potential small hernia in this vicinity without observed complication    02/08/2950 Procedure    Successful right IJ vein Port-A-Cath explant.     CURRENT THERAPY: Anastrozole, observation  INTERVAL HISTORY: Sierra Gray 57 y.o. female returns for evaluation of her history of ovarian cancer, and review of her risks with her ATM mutation.  She undergoes MRI breasts annually, alternating with diagnostic mammograms.  MRI on 06/2018 showed no evidence of malignancies.  Her next mammogram is due in 12/2018. She also underwent MRCP  03/2018 to increased pancreatic cancer risk, that shows no worrisome pancreatic lesions.  She has upcoming appointment with Dr. Ardis Hughs for management of this risk elevation--this has not yet been scheduled.    Sierra Gray is taking Anastrozole daily for risk reduction.  She is noting some hot flashes.  She thinks they may be getting worse recently.  She also notes mild intermittent joint aches.    She underwent colectomy on 09/11/2018 that showed a 5.1 cm tubular adenoma, without high grade glandular dysplasia, margins were negative, and there was no evidence of any carcinoma in 31 lymph nodes.   She notes that she is feeling well since surgery.  She says she has returned to her normal activity level, and her bowels are normal.     Patient Active Problem List   Diagnosis Date Noted  . S/P right colectomy Oct 2019 09/11/2018  . Abdominal wall hernia 03/02/2018  . Pancytopenia, acquired ([place]) 01/08/2018  . 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 Genetic testing 12/19/2017  . 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 Anemia due to antineoplastic chemotherapy 12/18/2017  . Other constipation 11/27/2017  . Family history of pancreatic cancer 11/27/2017  . Family history of prostate cancer 11/27/2017  . Paresthesia of right leg 11/05/2017  . Physical debility 11/05/2017  . Left ovarian epithelial cancer (HCC)     is allergic to bee venom; chlorhexidine; paclitaxel; bacitracin; iodine; and other.  MEDICAL HISTORY: Past Medical History:  Diagnosis Date  . Anemia    hx of  . Arthritis    oa knees  . Breast lump in female    benign  . Family history  of pancreatic cancer 11/27/2017  . Family history of prostate cancer 11/27/2017  . Fibroids   . Hyperlipidemia   . Hypertension   . Migraine    hx of   . Ovarian cancer ([place])   . Pelvic mass in female   . Pelvic mass in female   . PONV (postoperative nausea and vomiting)    nausea  only  . Sleep apnea     SURGICAL HISTORY: Past Surgical History:  Procedure Laterality Date  . ABDOMINAL HYSTERECTOMY  2009   Total laparoscopic hyst for fibroids  . BILATERAL SALPINGECTOMY Bilateral 09/11/2017   Procedure: BILATERAL SALPINGECTOMY WITH OOPHERECTOMY;  Surgeon: Everitt Amber, MD;  Location: [facility];  Service: Gynecology;  Laterality: Bilateral;  . BREAST BIOPSY  12/16/2014   2 lumps in  right breast removed  . BREAST LUMPECTOMY Left   . EXCISION OF SKIN TAG N/A 09/11/2017   Procedure: EXCISION OF SKIN TAG ON THIGH;  Surgeon: Everitt Amber, MD;  Location: [facility];  Service: Gynecology;  Laterality: N/A;  . IR FLUORO GUIDE PORT INSERTION RIGHT  11/03/2017  . IR REMOVAL TUN ACCESS W/ PORT W/O FL MOD SED  03/17/2018  . IR US GUIDE VASC ACCESS RIGHT  11/03/2017  . KNEE SURGERY Right    x3  . LAPAROSCOPIC PARTIAL RIGHT COLECTOMY Right 09/11/2018   Procedure: LAPAROSCOPIC ASSISTED  PARTIAL RIGHT HEMI-COLECTOMY ERAS PATHWAY;  Surgeon: Johnathan Hausen, MD;  Location: [facility];  Service: General;  Laterality: Right;  . LAPAROTOMY Bilateral 09/11/2017   Procedure: EXPLORATORY LAPAROTOMY;  Surgeon: Everitt Amber, MD;  Location: [facility];  Service: Gynecology;  Laterality: Bilateral;  . LAPAROTOMY WITH STAGING N/A 09/11/2017   Procedure: PELVIC AND PARA AORTIC LYMPH NODE  DISSECTION;  Surgeon: Everitt Amber, MD;  Location: [facility];  Service: Gynecology;  Laterality: N/A;  . OMENTECTOMY N/A 09/11/2017   Procedure: OMENTECTOMY;  Surgeon: Everitt Amber, MD;  Location: [facility];  Service: Gynecology;  Laterality: N/A;  . REDUCTION MAMMAPLASTY    . WISDOM TOOTH EXTRACTION      SOCIAL HISTORY: Social History   Socioeconomic History  . Marital status: Single    Spouse name: Not on file  . Number of children: Not on file  . Years of education: Not on file  . Highest education level: Not on file  Occupational History  . Occupation: Freight forwarder  Social Needs  . Financial resource strain: Not on file  . Food  insecurity:    Worry: Not on file    Inability: Not on file  . Transportation needs:    Medical: Not on file    Non-medical: Not on file  Tobacco Use  . Smoking status: Never Smoker  . Smokeless tobacco: Never Used  Substance and Sexual Activity  . Alcohol use: Yes    Comment: Socially drinks wine/beer  . Drug use: No  . Sexual activity: Never  Lifestyle  . Physical activity:    Days per week: Not on file    Minutes per session: Not on file  . Stress: Not on file  Relationships  . Social connections:    Talks on phone: Not on file    Gets together: Not on file    Attends religious service: Not on file    Active member of club or organization: Not on file    Attends meetings of clubs or organizations: Not on file    Relationship status: Not on file  . Intimate partner violence:    Fear of current or  ex partner: Not on file    Emotionally abused: Not on file    Physically abused: Not on file    Forced sexual activity: Not on file  Other Topics Concern  . Not on file  Social History Narrative  . Not on file    FAMILY HISTORY: Family History  Problem Relation Age of Onset  . Hypertension Mother   . Hyperlipidemia Mother   . Pancreatic cancer Father 75       died 49 months after dx  . Hypertension Father        questionable  . Prostate cancer Father 69       had surgery for it  . Breast cancer Maternal Aunt        dx 78's- she had surgery, think it was cancer but could have been just a lump removed  . Heart disease Maternal Aunt   . Lung cancer Maternal Aunt        primary  . Heart attack Maternal Grandmother   . Diabetes Maternal Grandfather   . Dementia Maternal Grandfather   . Osteoporosis Paternal Grandmother   . Colon cancer Paternal Grandfather 36  . Stomach cancer Neg Hx   . Esophageal cancer Neg Hx     Review of Systems  Constitutional: Negative for appetite change, chills, fatigue, fever and unexpected weight change.  HENT:   Negative for hearing  loss, lump/mass and trouble swallowing.   Eyes: Negative for eye problems and icterus.  Respiratory: Negative for cough and shortness of breath.   Cardiovascular: Negative for chest pain, leg swelling and palpitations.  Gastrointestinal: Negative for abdominal distention, abdominal pain, constipation, diarrhea, nausea and vomiting.  Endocrine: Positive for hot flashes.  Musculoskeletal: Positive for arthralgias.  Skin: Negative for itching and rash.  Neurological: Negative for dizziness, extremity weakness, headaches and numbness.  Hematological: Negative for adenopathy. Does not bruise/bleed easily.  Psychiatric/Behavioral: Negative for depression. The patient is not nervous/anxious.       PHYSICAL EXAMINATION  ECOG PERFORMANCE STATUS: 1 - Symptomatic but completely ambulatory  Vitals:   10/20/18 0936  BP: 127/87  Pulse: 86  Resp: 18  Temp: 98.5 F (36.9 C)  SpO2: 100%    Physical Exam  Constitutional: She is oriented to person, place, and time. She appears well-developed and well-nourished.  HENT:  Head: Normocephalic and atraumatic.  Mouth/Throat: Oropharynx is clear and moist. No oropharyngeal exudate.  Eyes: Pupils are equal, round, and reactive to light. No scleral icterus.  Neck: Neck supple.  Cardiovascular: Normal rate, regular rhythm and normal heart sounds.  Pulmonary/Chest: Effort normal and breath sounds normal.  Breasts examined: bilateral breasts are benign with no palpable or visual concerns noted  Abdominal: Soft. Bowel sounds are normal.  Musculoskeletal: She exhibits no edema.  Lymphadenopathy:    She has no cervical adenopathy.  Neurological: She is alert and oriented to person, place, and time.  Skin: Skin is warm and dry. Capillary refill takes less than 2 seconds.  Psychiatric: She has a normal mood and affect.    LABORATORY DATA:  CBC    Component Value Date/Time   WBC 13.0 (H) 09/12/2018 0347   RBC 3.98 09/12/2018 0347   HGB 11.1 (L)  09/12/2018 0347   HGB 10.1 (L) 11/27/2017 0818   HCT 34.4 (L) 09/12/2018 0347   HCT 30.2 (L) 11/27/2017 0818   PLT 343 09/12/2018 0347   PLT 294 11/27/2017 0818   MCV 86.4 09/12/2018 0347   MCV 83.2 11/27/2017 0818  MCH 27.9 09/12/2018 0347   MCHC 32.3 09/12/2018 0347   RDW 15.4 09/12/2018 0347   RDW 22.5 (H) 11/27/2017 0818   LYMPHSABS 1.2 03/02/2018 0943   LYMPHSABS 1.2 11/27/2017 0818   MONOABS 0.4 03/02/2018 0943   MONOABS 0.5 11/27/2017 0818   EOSABS 0.1 03/02/2018 0943   EOSABS 0.1 11/27/2017 0818   BASOSABS 0.0 03/02/2018 0943   BASOSABS 0.1 11/27/2017 0818    CMP     Component Value Date/Time   NA 143 09/12/2018 0347   NA 141 11/27/2017 0818   K 4.1 09/12/2018 0347   K 3.8 11/27/2017 0818   CL 104 09/12/2018 0347   CO2 27 09/12/2018 0347   CO2 25 11/27/2017 0818   GLUCOSE 116 (H) 09/12/2018 0347   GLUCOSE 90 11/27/2017 0818   BUN 9 09/12/2018 0347   BUN 12.9 11/27/2017 0818   CREATININE 0.97 09/12/2018 0347   CREATININE 0.8 11/27/2017 0818   CALCIUM 9.5 09/12/2018 0347   CALCIUM 9.9 11/27/2017 0818   PROT 7.3 03/02/2018 0943   PROT 7.0 11/27/2017 0818   ALBUMIN 4.1 03/02/2018 0943   ALBUMIN 3.8 11/27/2017 0818   AST 15 03/02/2018 0943   AST 11 11/27/2017 0818   ALT 15 03/02/2018 0943   ALT 11 11/27/2017 0818   ALKPHOS 69 03/02/2018 0943   ALKPHOS 68 11/27/2017 0818   BILITOT 0.4 03/02/2018 0943   BILITOT 0.41 11/27/2017 0818   GFRNONAA >60 09/12/2018 0347   GFRAA >60 09/12/2018 0347      ASSESSMENT and THERAPY PLAN:   Monoallelic mutation of ATM gene No clinical or radiographic sign of pancreatic or breast cancer.  We reviewed need of mammogram alternating with breast MRI, and f/u with GI Dr. Ardis Hughs.  She is taking Anastrozole daily for risk reduction of breast cancer.  She is tolerating this moderately well with the exception of hot flashes.  We reviewed common triggers of hot flashes including caffeine, hot beverages, spicy foods, tobacco, and  ETOH.  I reviewed that some medications can help alleviate hot flashes (Gabapentin or Effexor), however these are not without side effects also.  I also reviewed that sometimes acupuncture is helpful for hot flashes.  She would like more information on this.  We reviewed her screening schedule.    Mammogram: due 12/2018 Bone Density: 12/2018 (to be done with every other mammogram while on Anastrozole) MRI: 06/2019 Pancreatic cancer risk and screening per Dr. Ardis Hughs.     Left ovarian epithelial cancer (HCC) Last CA125 10.2 on 09/02/2018, she has completed adjuvant chemotherapy and will continue to f/u with Dr. Denman George as scheduled.    Orders Placed This Encounter  Procedures  . DG Bone Density    BCBS/NO PRIORS/NO CALCIUM OR MULTIVIT W/CAL/192 LB/NO NEEDS/TA/LORI COSIGNED OFFICE TO MAKE PT AWARE IF CALCIUM OR MULTIVIT W/CAL TAKEN, WILL NEED TO STOP 2 DAYS PRIOR AND IF TAKEN W/IN 2 DAYS WILL NEED TO [place].  TA     Standing Status:   Future    Standing Expiration Date:   10/20/2019    Scheduling Instructions:     Please schedule with mammogram    Order Specific Question:   Reason for Exam (SYMPTOM  OR DIAGNOSIS REQUIRED)    Answer:   estrogen deficiency    Order Specific Question:   Preferred imaging location?    Answer:   [facility]    Order Specific Question:   Is the patient pregnant?    Answer:   No  All questions were answered.  Barabara will f/u with Dr. Alvy Bimler in 6 months.   The patient knows to call the clinic with any problems, questions or concerns. We can certainly see the patient much sooner if necessary.  A total of (30) minutes of face-to-face time was spent with this patient with greater than 50% of that time in counseling and care-coordination.  This note was electronically signed. Scot Dock, NP 10/20/2018

## 2018-10-20 NOTE — Assessment & Plan Note (Addendum)
No clinical or radiographic sign of pancreatic or breast cancer.  We reviewed need of mammogram alternating with breast MRI, and f/u with GI Dr. Ardis Hughs.  She is taking Anastrozole daily for risk reduction of breast cancer.  She is tolerating this moderately well with the exception of hot flashes.  We reviewed common triggers of hot flashes including caffeine, hot beverages, spicy foods, tobacco, and ETOH.  I reviewed that some medications can help alleviate hot flashes (Gabapentin or Effexor), however these are not without side effects also.  I also reviewed that sometimes acupuncture is helpful for hot flashes.  She would like more information on this.  We reviewed her screening schedule.    Mammogram: due 12/2018 Bone Density: 12/2018 (to be done with every other mammogram while on Anastrozole) MRI: 06/2019 Pancreatic cancer risk and screening per Dr. Ardis Hughs.

## 2018-10-20 NOTE — Telephone Encounter (Signed)
Gave patient avs and calendar.   °

## 2018-10-20 NOTE — Patient Instructions (Signed)
Bone Health Bones protect organs, store calcium, and anchor muscles. Good health habits, such as eating nutritious foods and exercising regularly, are important for maintaining healthy bones. They can also help to prevent a condition that causes bones to lose density and become weak and brittle (osteoporosis). Why is bone mass important? Bone mass refers to the amount of bone tissue that you have. The higher your bone mass, the stronger your bones. An important step toward having healthy bones throughout life is to have strong and dense bones during childhood. A young adult who has a high bone mass is more likely to have a high bone mass later in life. Bone mass at its greatest it is called peak bone mass. A large decline in bone mass occurs in older adults. In women, it occurs about the time of menopause. During this time, it is important to practice good health habits, because if more bone is lost than what is replaced, the bones will become less healthy and more likely to break (fracture). If you find that you have a low bone mass, you may be able to prevent osteoporosis or further bone loss by changing your diet and lifestyle. How can I find out if my bone mass is low? Bone mass can be measured with an X-ray test that is called a bone mineral density (BMD) test. This test is recommended for all women who are age 65 or older. It may also be recommended for men who are age 70 or older, or for people who are more likely to develop osteoporosis due to:  Having bones that break easily.  Having a long-term disease that weakens bones, such as kidney disease or rheumatoid arthritis.  Having menopause earlier than normal.  Taking medicine that weakens bones, such as steroids, thyroid hormones, or hormone treatment for breast cancer or prostate cancer.  Smoking.  Drinking three or more alcoholic drinks each day.  What are the nutritional recommendations for healthy bones? To have healthy bones, you  need to get enough of the right minerals and vitamins. Most nutrition experts recommend getting these nutrients from the foods that you eat. Nutritional recommendations vary from person to person. Ask your health care provider what is healthy for you. Here are some general guidelines. Calcium Recommendations Calcium is the most important (essential) mineral for bone health. Most people can get enough calcium from their diet, but supplements may be recommended for people who are at risk for osteoporosis. Good sources of calcium include:  Dairy products, such as low-fat or nonfat milk, cheese, and yogurt.  Dark green leafy vegetables, such as bok choy and broccoli.  Calcium-fortified foods, such as orange juice, cereal, bread, soy beverages, and tofu products.  Nuts, such as almonds.  Follow these recommended amounts for daily calcium intake:  Children, age 1?3: 700 mg.  Children, age 4?8: 1,000 mg.  Children, age 9?13: 1,300 mg.  Teens, age 14?18: 1,300 mg.  Adults, age 19?50: 1,000 mg.  Adults, age 51?70: ? Men: 1,000 mg. ? Women: 1,200 mg.  Adults, age 71 or older: 1,200 mg.  Pregnant and breastfeeding females: ? Teens: 1,300 mg. ? Adults: 1,000 mg.  Vitamin D Recommendations Vitamin D is the most essential vitamin for bone health. It helps the body to absorb calcium. Sunlight stimulates the skin to make vitamin D, so be sure to get enough sunlight. If you live in a cold climate or you do not get outside often, your health care provider may recommend that you take vitamin   D supplements. Good sources of vitamin D in your diet include:  Egg yolks.  Saltwater fish.  Milk and cereal fortified with vitamin D.  Follow these recommended amounts for daily vitamin D intake:  Children and teens, age 1?18: 600 international units.  Adults, age 50 or younger: 400-800 international units.  Adults, age 51 or older: 800-1,000 international units.  Other Nutrients Other nutrients  for bone health include:  Phosphorus. This mineral is found in meat, poultry, dairy foods, nuts, and legumes. The recommended daily intake for adult men and adult women is 700 mg.  Magnesium. This mineral is found in seeds, nuts, dark green vegetables, and legumes. The recommended daily intake for adult men is 400?420 mg. For adult women, it is 310?320 mg.  Vitamin K. This vitamin is found in green leafy vegetables. The recommended daily intake is 120 mg for adult men and 90 mg for adult women.  What type of physical activity is best for building and maintaining healthy bones? Weight-bearing and strength-building activities are important for building and maintaining peak bone mass. Weight-bearing activities cause muscles and bones to work against gravity. Strength-building activities increases muscle strength that supports bones. Weight-bearing and muscle-building activities include:  Walking and hiking.  Jogging and running.  Dancing.  Gym exercises.  Lifting weights.  Tennis and racquetball.  Climbing stairs.  Aerobics.  Adults should get at least 30 minutes of moderate physical activity on most days. Children should get at least 60 minutes of moderate physical activity on most days. Ask your health care provide what type of exercise is best for you. Where can I find more information? For more information, check out the following websites:  National Osteoporosis Foundation: http://nof.org/learn/basics  National Institutes of Health: http://www.niams.nih.gov/Health_Info/Bone/Bone_Health/bone_health_for_life.asp  This information is not intended to replace advice given to you by your health care provider. Make sure you discuss any questions you have with your health care provider. Document Released: 02/15/2004 Document Revised: 06/14/2016 Document Reviewed: 11/30/2014 Elsevier Interactive Patient Education  2018 [employer]  

## 2018-11-20 ENCOUNTER — Encounter: Payer: Self-pay | Admitting: Gynecologic Oncology

## 2018-11-20 ENCOUNTER — Inpatient Hospital Stay (HOSPITAL_BASED_OUTPATIENT_CLINIC_OR_DEPARTMENT_OTHER): Payer: BLUE CROSS/BLUE SHIELD | Admitting: Gynecologic Oncology

## 2018-11-20 ENCOUNTER — Other Ambulatory Visit: Payer: BLUE CROSS/BLUE SHIELD

## 2018-11-20 ENCOUNTER — Inpatient Hospital Stay: Payer: BLUE CROSS/BLUE SHIELD | Attending: Hematology and Oncology

## 2018-11-20 VITALS — BP 127/79 | HR 72 | Temp 98.2°F | Resp 16 | Ht 64.0 in | Wt 195.0 lb

## 2018-11-20 DIAGNOSIS — Z9221 Personal history of antineoplastic chemotherapy: Secondary | ICD-10-CM

## 2018-11-20 DIAGNOSIS — C562 Malignant neoplasm of left ovary: Secondary | ICD-10-CM | POA: Diagnosis present

## 2018-11-20 DIAGNOSIS — Z90710 Acquired absence of both cervix and uterus: Secondary | ICD-10-CM | POA: Diagnosis not present

## 2018-11-20 DIAGNOSIS — Z90722 Acquired absence of ovaries, bilateral: Secondary | ICD-10-CM | POA: Insufficient documentation

## 2018-11-20 NOTE — Progress Notes (Signed)
Follow-up Note: Gyn-Onc  Sierra Gray 57 y.o. female  CC:  Chief Complaint  Patient presents with  . 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 S/p adjuvant chemotherapy with 6 cycles carboplatin and paclitaxel completed 01/29/18.   Complete clinical response.  Recommendation is for continued 3 monthly surveillance visits   HPI: Patient was seen in consultation at the request of Dr. Nicoletta Dress.  Patient is a very pleasant 57 year old gravida 1 para 0 who in late August after having her [place] visit set up from the dental care and felt some abdominal discomfort. The pain increased and she subsequently that evening had a temperature of 102. This was corresponding with increased abdominal pain and then the abdominal pain dissipated. Since then she's had a occasional pain and last Monday. Due to the persistent discomfort she saw her primary care physician. She describes the pain as a occurring in both her right and left lower quadrants. She did not notice any upper abdominal discomfort. There's been no change in her bowel or bladder habits. She does endorse approximately a 20-25 pound weight loss in the last 6-9 months. Much of this has been intentional. An Thanksgiving of last year to the Christmas holiday she lost 10 pounds which was not intentional but since then her weight loss has been intentional and she feels that she's eating well.  She underwent a CT scan on 08/19/2017. It revealed a trace amount of ascites in the peritoneal cavity seen prior dominant weight in the lateral paracolic gutter and along the liver capsule. There was no nodule letter peritoneal enhancement identified to suggest peritoneal carcinomatosis. There really does state that there is a mass that is cystic and solid that replaces the entire uterus. It extends caudally to the cervix and appears to  invade the upper one third of the vagina. The caudal margin of the neoplasm made directly invading the posterior bladder dome to the medial thickness is normal. No fat separate the mass from the adjacent upper third of the rectum or from the mid sigmoid. The mass measures 12 x 13 x 20 cm in size and posteriorly and laterally displaces the right ovary. The left ovary was not competent to be identified. Of note the patient had undergone a laparoscopic hysterectomy at [facility] approximate 14 years ago secondary to fibroids.  She has not yet had tumor markers drawn. She states she's never had a colonoscopy. She's not sure if her last mammogram was in 2017 but she thinks she might be due this year. She does have family history of cancer her father died of pancreatic cancer in his late 1s. His maternal aunt with breast cancer in her 36s. A paternal grandfather died of colon cancer at 48. She does endorse allergies to chlorhexidine which causes a rash. This was an issue when she had knee surgery on the right. She believes Betadine is okay.  On 09/11/17 she underwent ex lap, BSO, omentectomy, pelvic and para-aortic lymphadenectomy, radical tumor debulking. Intraoperative findings were significant for a 20+cm left ovarian mass with nromal right ovary and omentum. No extraovarian disease was seen. Frozen confirmed adenocarcinoma.  There was no gross residual disease at completion of the surgery.  Final pathology confirmed a 20cm grade 1 endometrioid left ovarian adenocarcinoma with surface involvement but negative washings (stage IC2).  All other resected tissue (including omentum, nodes and peritoneal biopsy and washings) were negative.  In  accordance with NCCN guidelines she was recommended adjuvant carboplatin and paclitaxel chemotherapy and her case was discussed at multi-disciplinary tumor conference.  She went on to complete adjuvant chemotherapy with 6 cycles of carboplatin and paclitaxel in February,  2019. She tolerated therapy well and had a complete clinical response.   She presents today for follow-up. She had a ventral abdominal hernia for which she is had surgery at [facility] in June, 2019.  Interval Hx:  She has a transverse colon tumor with planned laparoscopic resection with Dr Hassell Done on 09/11/18.   CA 125 was normal at 10 on 08/31/18.   Review of Systems: Constitutional:  Denies fever.  Skin: no lesions or rash Cardiovascular: No chest pain, shortness of breath, or edema  Pulmonary: No cough or wheeze.  Gastro Intestinal: Reporting intermittent lower abdominal soreness.  No nausea, vomiting, constipation, or diarrhea reported. No bright red blood per rectum or change in bowel movement.  Genitourinary: Denies vaginal bleeding and discharge.  Musculoskeletal: no masses   Psychology: No complaints  Current Meds:  Outpatient Encounter Medications as of 11/20/2018  Medication Sig  . AMBULATORY NON FORMULARY MEDICATION Medication Name: Turmeric with ginger 500 mg tablet-1 tablet by mouth daily  . amLODipine (NORVASC) 5 MG tablet Take 5 mg by mouth every evening.  Marland Kitchen anastrozole (ARIMIDEX) 1 MG tablet Take 1 tablet (1 mg total) by mouth daily. (Patient taking differently: Take 1 mg by mouth daily. )  . Apple Cider Vinegar 600 MG CAPS Take 600 mg by mouth 2 (two) times daily. Takes in morning and in evening.  . B Complex-C (SUPER B COMPLEX PO) Take 1 tablet by mouth daily.  . cetirizine (ZYRTEC) 10 MG tablet Take 10 mg by mouth daily.  . Cholecalciferol (VITAMIN D3) 25 MCG TABS Take 1,000 Units by mouth daily.   Marland Kitchen ibuprofen (ADVIL,MOTRIN) 200 MG tablet Take 400 mg by mouth every 6 (six) hours as needed.  . Inulin (FIBER CHOICE PO) Take 10 g by mouth daily.  . Liniments (SALONPAS PAIN RELIEF PATCH EX) Place 1 patch onto the skin daily as needed (knee pain).  . Magnesium Gluconate 550 MG TABS Take 500 mg by mouth daily.   . Melatonin 5 MG TABS Take 5 mg by mouth daily.   . Misc Natural  Products (GLUCOSAMINE CHOND MSM FORMULA) TABS Take 2 tablets by mouth daily.  . Multiple Vitamins-Minerals (HAIR SKIN & NAILS ADVANCED PO) Take 1 tablet by mouth daily.  . Turmeric 500 MG TABS Take 500 mg by mouth daily.  Marland Kitchen ZINC-VITAMIN C PO Take 1,000 mg by mouth daily.   Facility-Administered Encounter Medications as of 11/20/2018  Medication  . 0.9 %  sodium chloride infusion    Allergy:  Allergies  Allergen Reactions  . Bee Venom Anaphylaxis  . Chlorhexidine Rash    Surgical prep wash  . Paclitaxel Other (See Comments)    Experienced severe back pain and flushing  . Bacitracin Rash  . Iodine Rash  . Other Rash    Ortho prep SURGICAL GLUE--RASH, VERY BOTHERSOME, REQUIRED BENADRYL     Social Hx:   Social History   Socioeconomic History  . Marital status: Single    Spouse name: Not on file  . Number of children: Not on file  . Years of education: Not on file  . Highest education level: Not on file  Occupational History  . Occupation: Freight forwarder  Social Needs  . Financial resource strain: Not on file  . Food insecurity:  Worry: Not on file    Inability: Not on file  . Transportation needs:    Medical: Not on file    Non-medical: Not on file  Tobacco Use  . Smoking status: Never Smoker  . Smokeless tobacco: Never Used  Substance and Sexual Activity  . Alcohol use: Yes    Comment: Socially drinks wine/beer  . Drug use: No  . Sexual activity: Never  Lifestyle  . Physical activity:    Days per week: Not on file    Minutes per session: Not on file  . Stress: Not on file  Relationships  . Social connections:    Talks on phone: Not on file    Gets together: Not on file    Attends religious service: Not on file    Active member of club or organization: Not on file    Attends meetings of clubs or organizations: Not on file    Relationship status: Not on file  . Intimate partner violence:    Fear of current or ex partner: Not on file    Emotionally abused: Not  on file    Physically abused: Not on file    Forced sexual activity: Not on file  Other Topics Concern  . Not on file  Social History Narrative  . Not on file    Past Surgical Hx:  Past Surgical History:  Procedure Laterality Date  . ABDOMINAL HYSTERECTOMY  2009   Total laparoscopic hyst for fibroids  . BILATERAL SALPINGECTOMY Bilateral 09/11/2017   Procedure: BILATERAL SALPINGECTOMY WITH OOPHERECTOMY;  Surgeon: Everitt Amber, MD;  Location: [facility];  Service: Gynecology;  Laterality: Bilateral;  . BREAST BIOPSY  12/16/2014   2 lumps in  right breast removed  . BREAST LUMPECTOMY Left   . EXCISION OF SKIN TAG N/A 09/11/2017   Procedure: EXCISION OF SKIN TAG ON THIGH;  Surgeon: Everitt Amber, MD;  Location: [facility];  Service: Gynecology;  Laterality: N/A;  . IR FLUORO GUIDE PORT INSERTION RIGHT  11/03/2017  . IR REMOVAL TUN ACCESS W/ PORT W/O FL MOD SED  03/17/2018  . IR US GUIDE VASC ACCESS RIGHT  11/03/2017  . KNEE SURGERY Right    x3  . LAPAROSCOPIC PARTIAL RIGHT COLECTOMY Right 09/11/2018   Procedure: LAPAROSCOPIC ASSISTED  PARTIAL RIGHT HEMI-COLECTOMY ERAS PATHWAY;  Surgeon: Johnathan Hausen, MD;  Location: [facility];  Service: General;  Laterality: Right;  . LAPAROTOMY Bilateral 09/11/2017   Procedure: EXPLORATORY LAPAROTOMY;  Surgeon: Everitt Amber, MD;  Location: [facility];  Service: Gynecology;  Laterality: Bilateral;  . LAPAROTOMY WITH STAGING N/A 09/11/2017   Procedure: PELVIC AND PARA AORTIC LYMPH NODE  DISSECTION;  Surgeon: Everitt Amber, MD;  Location: [facility];  Service: Gynecology;  Laterality: N/A;  . OMENTECTOMY N/A 09/11/2017   Procedure: OMENTECTOMY;  Surgeon: Everitt Amber, MD;  Location: [facility];  Service: Gynecology;  Laterality: N/A;  . REDUCTION MAMMAPLASTY    . WISDOM TOOTH EXTRACTION      Past Medical Hx:  Past Medical History:  Diagnosis Date  . Anemia    hx of  . Arthritis    oa knees  . Breast lump in female    benign  . Family history of pancreatic cancer 11/27/2017  .  Family history of prostate cancer 11/27/2017  . Fibroids   . Hyperlipidemia   . Hypertension   . Migraine    hx of   . Ovarian cancer ([place])   . Pelvic mass in female   . Pelvic mass  in female   . PONV (postoperative nausea and vomiting)    nausea only  . Sleep apnea     Oncology Hx:  Oncology History   Genetic testing came back positive for ATM variant     Left ovarian epithelial cancer ([place])   08/19/2017 Imaging    She had outside CT scan which showed large abdominal mass    08/27/2017 Tumor Marker    Patient's tumor was tested for the following markers: CA-125 Results of the tumor marker test revealed 904.5    09/11/2017 Pathology Results    1. 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 Soft tissue, biopsy, right medial thigh MATURE LIPOMA 3. Ovary and fallopian tube, right ENDOMETRIOMA AND SIMPLE SEROUS CYST WITH FALLOPIAN TUBE ADHESION NEGATIVE FOR MALIGNANCY 4. Omentum, resection for tumor BENIGN OMENTUM NEGATIVE FOR CARCINOMA 5. Lymph nodes, regional resection, right pelvic EIGHT BENIGN LYMPH NODES (0/8) 6. Lymph nodes, regional resection, left pelvic SEVEN BENIGN LYMPH NODES (0/7) 7. Lymph node, biopsy, right para-aortic FOUR BENIGN LYMPH NODES (0/4) 8. Lymph node, biopsy, left para-aortic ONE BENIGN LYMPH NODE (0/1) 9. Peritoneum, biopsy, left abdominal BENIGN FIBROMUSCULAR TISSUE 10. Peritoneum, biopsy, right abdominal BENIGN FIBROMUSCULAR TISSUE WITH SEROSITIS 11. Peritoneum, biopsy, pelvic FIBROADIPOSE TISSUE WITH SEROSITIS  Specimen(s): Ovary and fallopian tube Procedure: (including lymph node sampling): salpingo-oophorectomy Primary tumor site (including laterality): Left ovary Ovarian surface involvement: Yes Ovarian capsule intact without fragmentation: intact Maximum  tumor size (cm): 20.0 cm Histologic type: Endometrioid carcinoma Grade: 1 Peritoneal implants: (specify invasive or non-invasive): Negative Pelvic extension (list additional structures on separate lines and if involved): Negative Lymph nodes: number examined 20 ; number positive 0 TNM code: pT1c2, pN0, pMx FIGO Stage (based on pathologic findings, needs clinical correlation): IC2    09/11/2017 Surgery    Preoperative Diagnosis: 1. left adnexal mass.  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   Procedure(s) Performed: 1. Exploratory laparotomy with bilateral salpingo-oophorectomy, pelvic and para-aortic lymph node dissection, omentectomy and radical debulking for ovarian cancer (CPT [id number])  Surgeon: Everitt Amber, M.D. Operative Findings:20 cm left ovarian mass.   No intraperitoneal rupture occurred;  Frozen pathology was consistent with adenocarcinoma; right tube and ovary normal in appearance; 3) normal bilateral pelvic and para-aortic lymph nodes and omentum; small and large bowel to palpation. This represented an optimal cytoreduction with no gross visible disease remaining.     10/09/2017 Tumor Marker    Patient's tumor was tested for the following markers: CA-125 Results of the tumor marker test revealed 68.7    10/15/2017 Adverse Reaction    She developed reaction to Paclitaxel.    10/15/2017 - 01/29/2018 Chemotherapy    She received carboplatin. Due to infusion reaction to Taxol with cycle 1, treatment was switched to carboplatin and Abraxane    11/03/2017 Procedure    Successful placement of a right internal jugular approach power injectable Port-A-Cath. The catheter is ready for immediate use.    11/27/2017 Tumor Marker    Patient's tumor was tested for the following markers: CA-125 Results of the tumor marker test revealed 27.7    12/12/2017 Genetic Testing    The patient had genetic testing due to a personal history of ovarian  cancer and a family history of pancreatic cancer (and possibly breast cancer).  The [facility] Hereditary Cancer Panel + tumor HRD analysis  was ordered from the laboratory [facility]. The [facility] gene panel offered by [employer] includes sequencing and deletion/duplication testing of the following 29 genes: APC, ATM, BARD1, BMPR1A, BRCA1, BRCA2, BRIP1, CHD1, CDK4, CDKN2A, CHEK2, EPCAM (large rearrangement only), HOXB13, MLH1, MSH2, MSH6, MUTYH, NBN, PALB2, PMS2, PTEN, RAD51C, RAD51D, SMAD4, STK11, and TP53. Sequencing was performed for select regions of POLE and POLD1, and large rearrangement analysis was performed for select regions of GREM1.  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 [id number] ([id number]).  The date of this test report is 12/12/2017.    01/08/2018 Tumor Marker    Patient's tumor was tested for the following markers: CA-125 Results of the tumor marker test revealed 17.5    03/02/2018 Tumor Marker    Patient's tumor was tested for the following markers: CA-125 Results of the tumor marker test revealed 11.4    03/14/2018 Imaging    MRCP 1. No worrisome pancreatic lesions are currently present. 2. Cholelithiasis. 3. Small benign-appearing bilateral renal cysts. 4. Midline anterior abdominal wall laxity seen on coronal images, potential small hernia in this vicinity without observed complication    3/0/0511 Procedure    Successful right IJ vein Port-A-Cath explant.     Family Hx:  Family History  Problem Relation Age of Onset  . Hypertension Mother   . Hyperlipidemia Mother   . Pancreatic cancer Father 60       died 54 months after dx  . Hypertension Father        questionable  . Prostate cancer Father 63       had surgery for it  . Breast cancer Maternal Aunt        dx 37's- she had surgery, think it was cancer but could have been just a lump removed  . Heart disease Maternal Aunt   . Lung cancer Maternal Aunt        primary   . Heart attack Maternal Grandmother   . Diabetes Maternal Grandfather   . Dementia Maternal Grandfather   . Osteoporosis Paternal Grandmother   . Colon cancer Paternal Grandfather 94  . Stomach cancer Neg Hx   . Esophageal cancer Neg Hx     Vitals:  Blood pressure 127/79, pulse 72, temperature 98.2 F (36.8 C), resp. rate 16, height '5\' 4"'$  (1.626 m), weight 195 lb (88.5 kg), SpO2 100 %.  Physical Exam: Well-nourished well-developed female in no acute distress.  Neck: Supple, no lymphadenopathy, no thyromegaly.  Lungs: Clear to auscultation bilaterally.  Cardiac: Regular rate and rhythm.  Abdomen: Obese, soft, nontender, nondistended. The incision exhibits erythema which is blanching near to incision. No drainage or collections appreciated.  Groins: No lymphadenopathy.  Extremities no edema.  Pelvic: deferred   Thereasa Solo, MD 11/20/2018, 9:39 AM

## 2018-11-20 NOTE — Patient Instructions (Signed)
Please notify Dr Denman George at phone number [phone] if you notice vaginal bleeding, new pelvic or abdominal pains, bloating, feeling full easy, or a change in bladder or bowel function.   Please return to see Dr Denman George in 3 months time.

## 2018-11-21 LAB — CA 125: Cancer Antigen (CA) 125: 9.7 U/mL (ref 0.0–38.1)

## 2018-11-24 ENCOUNTER — Telehealth: Payer: Self-pay

## 2018-11-24 NOTE — Telephone Encounter (Signed)
LM for for Ms Madole to call regarding the results of lab work from 11-20-18 with visit with Dr.  Denman George. When Ms Marlatt returns call she needs to be notified that her CA-125 was stable and normal at 9.7.

## 2018-11-24 NOTE — Telephone Encounter (Signed)
Told the patient the results of the the CA-125as noted below.

## 2019-01-25 ENCOUNTER — Other Ambulatory Visit: Payer: Self-pay | Admitting: Hematology and Oncology

## 2019-02-17 ENCOUNTER — Inpatient Hospital Stay (HOSPITAL_BASED_OUTPATIENT_CLINIC_OR_DEPARTMENT_OTHER): Payer: BLUE CROSS/BLUE SHIELD | Admitting: Gynecologic Oncology

## 2019-02-17 ENCOUNTER — Encounter: Payer: Self-pay | Admitting: Gynecologic Oncology

## 2019-02-17 ENCOUNTER — Inpatient Hospital Stay: Payer: BLUE CROSS/BLUE SHIELD | Attending: Hematology and Oncology

## 2019-02-17 ENCOUNTER — Other Ambulatory Visit: Payer: Self-pay

## 2019-02-17 VITALS — BP 134/92 | HR 72 | Temp 98.2°F | Resp 18 | Ht 64.0 in | Wt 199.6 lb

## 2019-02-17 DIAGNOSIS — Z9221 Personal history of antineoplastic chemotherapy: Secondary | ICD-10-CM

## 2019-02-17 DIAGNOSIS — C562 Malignant neoplasm of left ovary: Secondary | ICD-10-CM

## 2019-02-17 DIAGNOSIS — I10 Essential (primary) hypertension: Secondary | ICD-10-CM | POA: Insufficient documentation

## 2019-02-17 DIAGNOSIS — Z9079 Acquired absence of other genital organ(s): Secondary | ICD-10-CM | POA: Insufficient documentation

## 2019-02-17 DIAGNOSIS — E785 Hyperlipidemia, unspecified: Secondary | ICD-10-CM | POA: Insufficient documentation

## 2019-02-17 DIAGNOSIS — Z90722 Acquired absence of ovaries, bilateral: Secondary | ICD-10-CM

## 2019-02-17 DIAGNOSIS — R634 Abnormal weight loss: Secondary | ICD-10-CM | POA: Diagnosis not present

## 2019-02-17 NOTE — Progress Notes (Signed)
Follow-up Note: Gyn-Onc  Sierra Gray 58 y.o. female  CC:  Chief Complaint  Patient presents with  . 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 S/p adjuvant chemotherapy with 6 cycles carboplatin and paclitaxel completed 01/29/18.   Complete clinical response.  Recommendation is for continued 3 monthly surveillance visits until February, 2021   HPI: Patient was seen in consultation at the request of Dr. Nicoletta Dress.  Patient is a very pleasant 58 year old gravida 1 para 0 who in late August after having her [place] visit set up from the dental care and felt some abdominal discomfort. The pain increased and she subsequently that evening had a temperature of 102. This was corresponding with increased abdominal pain and then the abdominal pain dissipated. Since then she's had a occasional pain and last Monday. Due to the persistent discomfort she saw her primary care physician. She describes the pain as a occurring in both her right and left lower quadrants. She did not notice any upper abdominal discomfort. There's been no change in her bowel or bladder habits. She does endorse approximately a 20-25 pound weight loss in the last 6-9 months. Much of this has been intentional. An Thanksgiving of last year to the Christmas holiday she lost 10 pounds which was not intentional but since then her weight loss has been intentional and she feels that she's eating well.  She underwent a CT scan on 08/19/2017. It revealed a trace amount of ascites in the peritoneal cavity seen prior dominant weight in the lateral paracolic gutter and along the liver capsule. There was no nodule letter peritoneal enhancement identified to suggest peritoneal carcinomatosis. There really does state that there is a mass that is cystic and solid that replaces the entire uterus. It extends caudally to the  cervix and appears to invade the upper one third of the vagina. The caudal margin of the neoplasm made directly invading the posterior bladder dome to the medial thickness is normal. No fat separate the mass from the adjacent upper third of the rectum or from the mid sigmoid. The mass measures 12 x 13 x 20 cm in size and posteriorly and laterally displaces the right ovary. The left ovary was not competent to be identified. Of note the patient had undergone a laparoscopic hysterectomy at [facility] approximate 14 years ago secondary to fibroids.  She has not yet had tumor markers drawn. She states she's never had a colonoscopy. She's not sure if her last mammogram was in 2017 but she thinks she might be due this year. She does have family history of cancer her father died of pancreatic cancer in his late 8s. His maternal aunt with breast cancer in her 39s. A paternal grandfather died of colon cancer at 40. She does endorse allergies to chlorhexidine which causes a rash. This was an issue when she had knee surgery on the right. She believes Betadine is okay.  On 09/11/17 she underwent ex lap, BSO, omentectomy, pelvic and para-aortic lymphadenectomy, radical tumor debulking. Intraoperative findings were significant for a 20+cm left ovarian mass with nromal right ovary and omentum. No extraovarian disease was seen. Frozen confirmed adenocarcinoma.  There was no gross residual disease at completion of the surgery.  Final pathology confirmed a 20cm grade 1 endometrioid left ovarian adenocarcinoma with surface involvement but negative washings (stage IC2).  All other resected tissue (including omentum, nodes and peritoneal biopsy and washings) were  negative.  In accordance with NCCN guidelines she was recommended adjuvant carboplatin and paclitaxel chemotherapy and her case was discussed at multi-disciplinary tumor conference.  She went on to complete adjuvant chemotherapy with 6 cycles of carboplatin and paclitaxel  in February, 2019. She tolerated therapy well and had a complete clinical response.   She presents today for follow-up. She had a ventral abdominal hernia for which she is had surgery at [facility] in June, 2019.  Interval Hx:  She has a transverse colon tumor with planned laparoscopic resection with Dr Hassell Done on 09/11/18.   CA 125 was normal at 10 on 08/31/18.  CA 125 was normal at 9.7 on 11/21/19.  Review of Systems: Constitutional:  Denies fever.  Skin: no lesions or rash Cardiovascular: No chest pain, shortness of breath, or edema  Pulmonary: No cough or wheeze.  Gastro Intestinal: Reporting intermittent lower abdominal soreness.  No nausea, vomiting, constipation, or diarrhea reported. No bright red blood per rectum or change in bowel movement.  Genitourinary: Denies vaginal bleeding and discharge.  Musculoskeletal: no masses   Psychology: No complaints  Current Meds:  Outpatient Encounter Medications as of 02/17/2019  Medication Sig  . AMBULATORY NON FORMULARY MEDICATION Medication Name: Turmeric with ginger 500 mg tablet-1 tablet by mouth daily  . amLODipine (NORVASC) 5 MG tablet Take 5 mg by mouth every evening.  Marland Kitchen anastrozole (ARIMIDEX) 1 MG tablet TAKE 1 TABLET BY MOUTH EVERY DAY  . Apple Cider Vinegar 600 MG CAPS Take 600 mg by mouth 2 (two) times daily. Takes in morning and in evening.  . B Complex-C (SUPER B COMPLEX PO) Take 1 tablet by mouth daily.  . cetirizine (ZYRTEC) 10 MG tablet Take 10 mg by mouth daily.  . Cholecalciferol (VITAMIN D3) 25 MCG TABS Take 1,000 Units by mouth daily.   Marland Kitchen ibuprofen (ADVIL,MOTRIN) 200 MG tablet Take 400 mg by mouth every 6 (six) hours as needed.  . Inulin (FIBER CHOICE PO) Take 10 g by mouth daily.  . Liniments (SALONPAS PAIN RELIEF PATCH EX) Place 1 patch onto the skin daily as needed (knee pain).  . Magnesium Gluconate 550 MG TABS Take 500 mg by mouth daily.   . Melatonin 5 MG TABS Take 5 mg by mouth daily.   . Misc Natural Products  (GLUCOSAMINE CHOND MSM FORMULA) TABS Take 2 tablets by mouth daily.  . Multiple Vitamins-Minerals (HAIR SKIN & NAILS ADVANCED PO) Take 1 tablet by mouth daily.  . Turmeric 500 MG TABS Take 500 mg by mouth daily.  Marland Kitchen ZINC-VITAMIN C PO Take 1,000 mg by mouth daily.   Facility-Administered Encounter Medications as of 02/17/2019  Medication  . 0.9 %  sodium chloride infusion    Allergy:  Allergies  Allergen Reactions  . Bee Venom Anaphylaxis  . Chlorhexidine Rash    Surgical prep wash  . Paclitaxel Other (See Comments)    Experienced severe back pain and flushing  . Bacitracin Rash  . Iodine Rash  . Other Rash    Ortho prep SURGICAL GLUE--RASH, VERY BOTHERSOME, REQUIRED BENADRYL     Social Hx:   Social History   Socioeconomic History  . Marital status: Single    Spouse name: Not on file  . Number of children: Not on file  . Years of education: Not on file  . Highest education level: Not on file  Occupational History  . Occupation: Freight forwarder  Social Needs  . Financial resource strain: Not on file  . Food insecurity:    Worry:  Not on file    Inability: Not on file  . Transportation needs:    Medical: Not on file    Non-medical: Not on file  Tobacco Use  . Smoking status: Never Smoker  . Smokeless tobacco: Never Used  Substance and Sexual Activity  . Alcohol use: Yes    Comment: Socially drinks wine/beer  . Drug use: No  . Sexual activity: Never  Lifestyle  . Physical activity:    Days per week: Not on file    Minutes per session: Not on file  . Stress: Not on file  Relationships  . Social connections:    Talks on phone: Not on file    Gets together: Not on file    Attends religious service: Not on file    Active member of club or organization: Not on file    Attends meetings of clubs or organizations: Not on file    Relationship status: Not on file  . Intimate partner violence:    Fear of current or ex partner: Not on file    Emotionally abused: Not on file     Physically abused: Not on file    Forced sexual activity: Not on file  Other Topics Concern  . Not on file  Social History Narrative  . Not on file    Past Surgical Hx:  Past Surgical History:  Procedure Laterality Date  . ABDOMINAL HYSTERECTOMY  2009   Total laparoscopic hyst for fibroids  . BILATERAL SALPINGECTOMY Bilateral 09/11/2017   Procedure: BILATERAL SALPINGECTOMY WITH OOPHERECTOMY;  Surgeon: Everitt Amber, MD;  Location: [facility];  Service: Gynecology;  Laterality: Bilateral;  . BREAST BIOPSY  12/16/2014   2 lumps in  right breast removed  . BREAST LUMPECTOMY Left   . EXCISION OF SKIN TAG N/A 09/11/2017   Procedure: EXCISION OF SKIN TAG ON THIGH;  Surgeon: Everitt Amber, MD;  Location: [facility];  Service: Gynecology;  Laterality: N/A;  . IR FLUORO GUIDE PORT INSERTION RIGHT  11/03/2017  . IR REMOVAL TUN ACCESS W/ PORT W/O FL MOD SED  03/17/2018  . IR US GUIDE VASC ACCESS RIGHT  11/03/2017  . KNEE SURGERY Right    x3  . LAPAROSCOPIC PARTIAL RIGHT COLECTOMY Right 09/11/2018   Procedure: LAPAROSCOPIC ASSISTED  PARTIAL RIGHT HEMI-COLECTOMY ERAS PATHWAY;  Surgeon: Johnathan Hausen, MD;  Location: [facility];  Service: General;  Laterality: Right;  . LAPAROTOMY Bilateral 09/11/2017   Procedure: EXPLORATORY LAPAROTOMY;  Surgeon: Everitt Amber, MD;  Location: [facility];  Service: Gynecology;  Laterality: Bilateral;  . LAPAROTOMY WITH STAGING N/A 09/11/2017   Procedure: PELVIC AND PARA AORTIC LYMPH NODE  DISSECTION;  Surgeon: Everitt Amber, MD;  Location: [facility];  Service: Gynecology;  Laterality: N/A;  . OMENTECTOMY N/A 09/11/2017   Procedure: OMENTECTOMY;  Surgeon: Everitt Amber, MD;  Location: [facility];  Service: Gynecology;  Laterality: N/A;  . REDUCTION MAMMAPLASTY    . WISDOM TOOTH EXTRACTION      Past Medical Hx:  Past Medical History:  Diagnosis Date  . Anemia    hx of  . Arthritis    oa knees  . Breast lump in female    benign  . Family history of pancreatic cancer 11/27/2017  . Family  history of prostate cancer 11/27/2017  . Fibroids   . Hyperlipidemia   . Hypertension   . Migraine    hx of   . Ovarian cancer ([place])   . Pelvic mass in female   . Pelvic mass in  female   . PONV (postoperative nausea and vomiting)    nausea only  . Sleep apnea     Oncology Hx:  Oncology History   Genetic testing came back positive for ATM variant     Left ovarian epithelial cancer ([place])   08/19/2017 Imaging    She had outside CT scan which showed large abdominal mass    08/27/2017 Tumor Marker    Patient's tumor was tested for the following markers: CA-125 Results of the tumor marker test revealed 904.5    09/11/2017 Pathology Results    1. 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 Soft tissue, biopsy, right medial thigh MATURE LIPOMA 3. Ovary and fallopian tube, right ENDOMETRIOMA AND SIMPLE SEROUS CYST WITH FALLOPIAN TUBE ADHESION NEGATIVE FOR MALIGNANCY 4. Omentum, resection for tumor BENIGN OMENTUM NEGATIVE FOR CARCINOMA 5. Lymph nodes, regional resection, right pelvic EIGHT BENIGN LYMPH NODES (0/8) 6. Lymph nodes, regional resection, left pelvic SEVEN BENIGN LYMPH NODES (0/7) 7. Lymph node, biopsy, right para-aortic FOUR BENIGN LYMPH NODES (0/4) 8. Lymph node, biopsy, left para-aortic ONE BENIGN LYMPH NODE (0/1) 9. Peritoneum, biopsy, left abdominal BENIGN FIBROMUSCULAR TISSUE 10. Peritoneum, biopsy, right abdominal BENIGN FIBROMUSCULAR TISSUE WITH SEROSITIS 11. Peritoneum, biopsy, pelvic FIBROADIPOSE TISSUE WITH SEROSITIS  Specimen(s): Ovary and fallopian tube Procedure: (including lymph node sampling): salpingo-oophorectomy Primary tumor site (including laterality): Left ovary Ovarian surface involvement: Yes Ovarian capsule intact without fragmentation: intact Maximum tumor  size (cm): 20.0 cm Histologic type: Endometrioid carcinoma Grade: 1 Peritoneal implants: (specify invasive or non-invasive): Negative Pelvic extension (list additional structures on separate lines and if involved): Negative Lymph nodes: number examined 20 ; number positive 0 TNM code: pT1c2, pN0, pMx FIGO Stage (based on pathologic findings, needs clinical correlation): IC2    09/11/2017 Surgery    Preoperative Diagnosis: 1. left adnexal mass.  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   Procedure(s) Performed: 1. Exploratory laparotomy with bilateral salpingo-oophorectomy, pelvic and para-aortic lymph node dissection, omentectomy and radical debulking for ovarian cancer (CPT [phone])  Surgeon: Everitt Amber, M.D. Operative Findings:20 cm left ovarian mass.   No intraperitoneal rupture occurred;  Frozen pathology was consistent with adenocarcinoma; right tube and ovary normal in appearance; 3) normal bilateral pelvic and para-aortic lymph nodes and omentum; small and large bowel to palpation. This represented an optimal cytoreduction with no gross visible disease remaining.     10/09/2017 Tumor Marker    Patient's tumor was tested for the following markers: CA-125 Results of the tumor marker test revealed 68.7    10/15/2017 Adverse Reaction    She developed reaction to Paclitaxel.    10/15/2017 - 01/29/2018 Chemotherapy    She received carboplatin. Due to infusion reaction to Taxol with cycle 1, treatment was switched to carboplatin and Abraxane    11/03/2017 Procedure    Successful placement of a right internal jugular approach power injectable Port-A-Cath. The catheter is ready for immediate use.    11/27/2017 Tumor Marker    Patient's tumor was tested for the following markers: CA-125 Results of the tumor marker test revealed 27.7    12/12/2017 Genetic Testing    The patient had genetic testing due to a personal history of ovarian cancer and  a family history of pancreatic cancer (and possibly breast cancer).  The [facility] Hereditary Cancer Panel + tumor HRD analysis was  ordered from the laboratory [facility]. The [facility] gene panel offered by [employer] includes sequencing and deletion/duplication testing of the following 29 genes: APC, ATM, BARD1, BMPR1A, BRCA1, BRCA2, BRIP1, CHD1, CDK4, CDKN2A, CHEK2, EPCAM (large rearrangement only), HOXB13, MLH1, MSH2, MSH6, MUTYH, NBN, PALB2, PMS2, PTEN, RAD51C, RAD51D, SMAD4, STK11, and TP53. Sequencing was performed for select regions of POLE and POLD1, and large rearrangement analysis was performed for select regions of GREM1.  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 [id number] ([id number]).  The date of this test report is 12/12/2017.    01/08/2018 Tumor Marker    Patient's tumor was tested for the following markers: CA-125 Results of the tumor marker test revealed 17.5    03/02/2018 Tumor Marker    Patient's tumor was tested for the following markers: CA-125 Results of the tumor marker test revealed 11.4    03/14/2018 Imaging    MRCP 1. No worrisome pancreatic lesions are currently present. 2. Cholelithiasis. 3. Small benign-appearing bilateral renal cysts. 4. Midline anterior abdominal wall laxity seen on coronal images, potential small hernia in this vicinity without observed complication    04/11/9671 Procedure    Successful right IJ vein Port-A-Cath explant.     Family Hx:  Family History  Problem Relation Age of Onset  . Hypertension Mother   . Hyperlipidemia Mother   . Pancreatic cancer Father 28       died 63 months after dx  . Hypertension Father        questionable  . Prostate cancer Father 86       had surgery for it  . Breast cancer Maternal Aunt        dx 5's- she had surgery, think it was cancer but could have been just a lump removed  . Heart disease Maternal Aunt   . Lung cancer Maternal Aunt        primary  . Heart  attack Maternal Grandmother   . Diabetes Maternal Grandfather   . Dementia Maternal Grandfather   . Osteoporosis Paternal Grandmother   . Colon cancer Paternal Grandfather 78  . Stomach cancer Neg Hx   . Esophageal cancer Neg Hx     Vitals:  Blood pressure (!) 134/92, pulse 72, temperature 98.2 F (36.8 C), resp. rate 18, height '5\' 4"'$  (1.626 m), weight 199 lb 9 oz (90.5 kg), SpO2 99 %.  Physical Exam: Well-nourished well-developed female in no acute distress.  Neck: Supple, no lymphadenopathy, no thyromegaly.  Lungs: Clear to auscultation bilaterally.  Cardiac: Regular rate and rhythm.  Abdomen: Obese, soft, nontender, nondistended.   Groins: No lymphadenopathy.  Extremities no edema.  Pelvic: no palpable masses in pelvis on vaginal bimanual exam.    Thereasa Solo, MD 02/17/2019, 2:17 PM

## 2019-02-17 NOTE — Patient Instructions (Signed)
Please notify Dr Denman George at phone number [phone] if you notice vaginal bleeding, new pelvic or abdominal pains, bloating, feeling full easy, or a change in bladder or bowel function.   Please return to see Dr Denman George in 3 months for a lab check and exam.

## 2019-02-18 ENCOUNTER — Telehealth: Payer: Self-pay

## 2019-02-18 LAB — CA 125: Cancer Antigen (CA) 125: 9 U/mL (ref 0.0–38.1)

## 2019-02-18 NOTE — Telephone Encounter (Signed)
Pt called back- results of CA 125 given as stable and within normal limits per [name] NP - pt voiced understanding. No other needs per pt at this time.

## 2019-02-18 NOTE — Telephone Encounter (Signed)
Outgoing call to pt regarding CA 125 results as "stable and within normal limits" per [name] NP.  No answer, left VM to call our office.

## 2019-04-13 ENCOUNTER — Telehealth: Payer: Self-pay

## 2019-04-13 NOTE — Telephone Encounter (Signed)
Notified patient to schedule her f/u appointment per dr Ardis Hughs recommendations at last office visit. Patient declined at this time and will contact the office when she feels ready.

## 2019-04-13 NOTE — Telephone Encounter (Signed)
-----   Message from Angie Fava, LPN sent at 07/17/9832  9:59 AM EDT ----- Call patient to sschedule follow up 2020

## 2019-04-22 ENCOUNTER — Ambulatory Visit: Payer: BLUE CROSS/BLUE SHIELD | Admitting: Hematology and Oncology

## 2019-05-19 ENCOUNTER — Telehealth: Payer: Self-pay | Admitting: *Deleted

## 2019-05-19 NOTE — Telephone Encounter (Signed)
Patient called and canceled her appt for Friday 6/12, patient will keep her lab appt. Patient stated that was dicussed at her last appt with Dr. Denman George. Per Melissa AAP, ok to cancel appt.  Patient scheduled appts for September. Patient stated that she has no issues or concerns.

## 2019-05-21 ENCOUNTER — Inpatient Hospital Stay: Payer: BC Managed Care – PPO | Attending: Hematology and Oncology

## 2019-05-21 ENCOUNTER — Other Ambulatory Visit: Payer: Self-pay

## 2019-05-21 ENCOUNTER — Ambulatory Visit: Payer: BLUE CROSS/BLUE SHIELD | Admitting: Gynecologic Oncology

## 2019-05-21 DIAGNOSIS — C562 Malignant neoplasm of left ovary: Secondary | ICD-10-CM | POA: Diagnosis present

## 2019-05-22 LAB — CA 125: Cancer Antigen (CA) 125: 10.5 U/mL (ref 0.0–38.1)

## 2019-05-24 ENCOUNTER — Telehealth: Payer: Self-pay

## 2019-05-24 NOTE — Telephone Encounter (Signed)
Told Sierra Gray that her CA-125 from 05-21-19 was stable at 10.5 and WNL per [name], NP. Keep appointment in September as scheduled.

## 2019-06-30 ENCOUNTER — Encounter: Payer: Self-pay | Admitting: Gastroenterology

## 2019-07-25 ENCOUNTER — Other Ambulatory Visit: Payer: Self-pay | Admitting: Hematology and Oncology

## 2019-08-31 ENCOUNTER — Telehealth: Payer: Self-pay | Admitting: Hematology and Oncology

## 2019-08-31 ENCOUNTER — Telehealth: Payer: Self-pay | Admitting: *Deleted

## 2019-08-31 NOTE — Telephone Encounter (Signed)
Returned patient's phone call regarding cancelling an appointment, 09/25 appointment has been cancelled. Called to reschedule, left a voicemail.

## 2019-08-31 NOTE — Telephone Encounter (Signed)
Patient called and canceled her appts for Friday. Stated that she will call back to reschedule

## 2019-09-03 ENCOUNTER — Inpatient Hospital Stay: Payer: BC Managed Care – PPO

## 2019-09-03 ENCOUNTER — Inpatient Hospital Stay: Payer: BC Managed Care – PPO | Admitting: Gynecologic Oncology

## 2019-10-29 ENCOUNTER — Other Ambulatory Visit: Payer: Self-pay

## 2019-10-29 DIAGNOSIS — Z20822 Contact with and (suspected) exposure to covid-19: Secondary | ICD-10-CM

## 2019-11-01 LAB — NOVEL CORONAVIRUS, NAA: SARS-CoV-2, NAA: NOT DETECTED

## 2019-11-18 ENCOUNTER — Telehealth: Payer: Self-pay | Admitting: Hematology and Oncology

## 2019-11-18 NOTE — Telephone Encounter (Signed)
Confirmed 12/21/19 f/u with patient. Patient called to reschedule cancelled May 2020 visit.

## 2019-11-19 ENCOUNTER — Telehealth: Payer: Self-pay | Admitting: *Deleted

## 2019-11-19 ENCOUNTER — Other Ambulatory Visit: Payer: Self-pay | Admitting: Gynecologic Oncology

## 2019-11-19 DIAGNOSIS — C562 Malignant neoplasm of left ovary: Secondary | ICD-10-CM

## 2019-11-19 NOTE — Telephone Encounter (Signed)
Returned the patient's call and scheduled appts

## 2019-12-21 ENCOUNTER — Telehealth: Payer: Self-pay | Admitting: Hematology and Oncology

## 2019-12-21 ENCOUNTER — Inpatient Hospital Stay: Payer: 59 | Attending: Hematology and Oncology

## 2019-12-21 ENCOUNTER — Other Ambulatory Visit: Payer: Self-pay | Admitting: Hematology and Oncology

## 2019-12-21 ENCOUNTER — Other Ambulatory Visit: Payer: Self-pay

## 2019-12-21 ENCOUNTER — Inpatient Hospital Stay (HOSPITAL_BASED_OUTPATIENT_CLINIC_OR_DEPARTMENT_OTHER): Payer: 59 | Admitting: Hematology and Oncology

## 2019-12-21 DIAGNOSIS — Z1509 Genetic susceptibility to other malignant neoplasm: Secondary | ICD-10-CM

## 2019-12-21 DIAGNOSIS — Z79811 Long term (current) use of aromatase inhibitors: Secondary | ICD-10-CM | POA: Insufficient documentation

## 2019-12-21 DIAGNOSIS — C562 Malignant neoplasm of left ovary: Secondary | ICD-10-CM | POA: Diagnosis present

## 2019-12-21 DIAGNOSIS — Z1501 Genetic susceptibility to malignant neoplasm of breast: Secondary | ICD-10-CM | POA: Diagnosis not present

## 2019-12-21 DIAGNOSIS — R232 Flushing: Secondary | ICD-10-CM | POA: Insufficient documentation

## 2019-12-21 DIAGNOSIS — Z9221 Personal history of antineoplastic chemotherapy: Secondary | ICD-10-CM | POA: Insufficient documentation

## 2019-12-21 DIAGNOSIS — Z1589 Genetic susceptibility to other disease: Secondary | ICD-10-CM

## 2019-12-21 LAB — COMPREHENSIVE METABOLIC PANEL
ALT: 22 U/L (ref 0–44)
AST: 18 U/L (ref 15–41)
Albumin: 4.1 g/dL (ref 3.5–5.0)
Alkaline Phosphatase: 81 U/L (ref 38–126)
Anion gap: 12 (ref 5–15)
BUN: 18 mg/dL (ref 6–20)
CO2: 25 mmol/L (ref 22–32)
Calcium: 9.9 mg/dL (ref 8.9–10.3)
Chloride: 104 mmol/L (ref 98–111)
Creatinine, Ser: 1.01 mg/dL — ABNORMAL HIGH (ref 0.44–1.00)
GFR calc Af Amer: >60 mL/min (ref >60)
GFR calc non Af Amer: >60 mL/min (ref >60)
Glucose, Bld: 101 mg/dL — ABNORMAL HIGH (ref 70–99)
Potassium: 4.4 mmol/L (ref 3.5–5.1)
Sodium: 141 mmol/L (ref 135–145)
Total Bilirubin: 0.4 mg/dL (ref 0.3–1.2)
Total Protein: 7.5 g/dL (ref 6.5–8.1)

## 2019-12-21 LAB — CBC WITH DIFFERENTIAL/PLATELET
Abs Immature Granulocytes: 0.03 10*3/uL (ref 0.00–0.07)
Basophils Absolute: 0.1 10*3/uL (ref 0.0–0.1)
Basophils Relative: 1 %
Eosinophils Absolute: 0.3 10*3/uL (ref 0.0–0.5)
Eosinophils Relative: 5 %
HCT: 41.7 % (ref 36.0–46.0)
Hemoglobin: 13.6 g/dL (ref 12.0–15.0)
Immature Granulocytes: 1 %
Lymphocytes Relative: 26 %
Lymphs Abs: 1.7 10*3/uL (ref 0.7–4.0)
MCH: 28.5 pg (ref 26.0–34.0)
MCHC: 32.6 g/dL (ref 30.0–36.0)
MCV: 87.2 fL (ref 80.0–100.0)
Monocytes Absolute: 0.7 10*3/uL (ref 0.1–1.0)
Monocytes Relative: 10 %
Neutro Abs: 3.8 10*3/uL (ref 1.7–7.7)
Neutrophils Relative %: 57 %
Platelets: 379 10*3/uL (ref 150–400)
RBC: 4.78 MIL/uL (ref 3.87–5.11)
RDW: 14 % (ref 11.5–15.5)
WBC: 6.6 10*3/uL (ref 4.0–10.5)
nRBC: 0 % (ref 0.0–0.2)

## 2019-12-21 NOTE — Telephone Encounter (Signed)
Scheduled per 1/12 sch msg. Called and spoke with pt, confirmed 7/12 appt

## 2019-12-22 ENCOUNTER — Encounter: Payer: Self-pay | Admitting: Hematology and Oncology

## 2019-12-22 ENCOUNTER — Other Ambulatory Visit: Payer: Self-pay | Admitting: Hematology and Oncology

## 2019-12-22 ENCOUNTER — Telehealth: Payer: Self-pay

## 2019-12-22 DIAGNOSIS — R232 Flushing: Secondary | ICD-10-CM | POA: Insufficient documentation

## 2019-12-22 DIAGNOSIS — Z1231 Encounter for screening mammogram for malignant neoplasm of breast: Secondary | ICD-10-CM

## 2019-12-22 LAB — CA 125: Cancer Antigen (CA) 125: 9.5 U/mL (ref 0.0–38.1)

## 2019-12-22 NOTE — Progress Notes (Signed)
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 She have no signs or symptoms to suggest cancer recurrence Tumor marker is within normal limits She will continue alternate appointment between GYN surgeon and myself every few months I will see her in 6 months for further follow-up  Monoallelic mutation of ATM gene She is at risk of pancreatic cancer We discussed screening modality with endoscopic ultrasound with GI and MRCP Recent MRCP in April 2019 showed no evidence of pancreatic lesions She has appointment to see gastroenterologist for further management From the breast cancer standpoint, she is on Arimidex She has missed screening mammogram and MRI of the breast due to loss of insurance I recommend her to get screening mammogram reschedule now   Hot flashes She has menopausal symptoms with hot flashes We discussed management but the patient is comfortable without additional therapy She will continue Arimidex for now I recommend screening bone density scan in the future but I will wait until I see her in her next visit The critical tests today are blood work and screening mammogram   No orders of the defined types were placed in this encounter.   All questions were answered. The patient knows to call the clinic with any problems, questions or concerns. The total time spent in the appointment was 20 minutes encounter with patients including review of chart and various tests results, discussions about plan of care and coordination of care plan   Heath Lark, MD 12/22/2019 9:50 AM  INTERVAL HISTORY: Please see below for problem oriented charting. She returns for further follow-up She has missed is due to loss of insurance She has not have her screening mammogram for almost 2 years She denies any recent  abnormal breast examination, palpable mass, abnormal breast appearance or nipple changes She denies abdominal pain, changes in bowel habits or abdominal bloating She complains of intermittent hot flashes  SUMMARY OF ONCOLOGIC HISTORY: Oncology History Overview Note  Genetic testing came back positive for ATM variant   Left ovarian epithelial cancer ([place])  08/19/2017 Imaging   She had outside CT scan which showed large abdominal mass   08/27/2017 Tumor Marker   Patient's tumor was tested for the following markers: CA-125 Results of the tumor marker test revealed 904.5   09/11/2017 Pathology Results   1. 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 Soft tissue, biopsy, right medial thigh MATURE LIPOMA 3. Ovary and fallopian tube, right ENDOMETRIOMA AND SIMPLE SEROUS CYST WITH FALLOPIAN TUBE ADHESION NEGATIVE FOR MALIGNANCY 4. Omentum, resection for tumor BENIGN OMENTUM NEGATIVE FOR CARCINOMA 5. Lymph nodes, regional resection, right pelvic EIGHT BENIGN LYMPH NODES (0/8) 6. Lymph nodes, regional resection, left pelvic SEVEN BENIGN LYMPH NODES (0/7) 7. Lymph node, biopsy, right para-aortic FOUR BENIGN LYMPH NODES (0/4) 8. Lymph node, biopsy, left para-aortic ONE BENIGN LYMPH NODE (0/1) 9. Peritoneum, biopsy, left abdominal BENIGN FIBROMUSCULAR TISSUE 10. Peritoneum, biopsy, right abdominal BENIGN FIBROMUSCULAR TISSUE WITH SEROSITIS 11. Peritoneum, biopsy, pelvic FIBROADIPOSE TISSUE WITH SEROSITIS  Specimen(s): Ovary and fallopian tube Procedure: (including lymph node sampling): salpingo-oophorectomy Primary tumor site (including laterality): Left ovary Ovarian surface involvement: Yes Ovarian capsule intact without fragmentation: intact Maximum tumor size (cm): 20.0 cm Histologic type: Endometrioid  carcinoma Grade: 1 Peritoneal implants: (specify invasive or non-invasive): Negative Pelvic extension (list additional structures on separate lines and if involved): Negative Lymph nodes: number examined 20 ; number positive 0 TNM code: pT1c2, pN0, pMx FIGO Stage (based on pathologic findings, needs clinical correlation): IC2   09/11/2017 Surgery   Preoperative Diagnosis: 1. left adnexal mass.  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   Procedure(s) Performed: 1. Exploratory laparotomy with bilateral salpingo-oophorectomy, pelvic and para-aortic lymph node dissection, omentectomy and radical debulking for ovarian cancer (CPT [phone])  Surgeon: Everitt Amber, M.D. Operative Findings:20 cm left ovarian mass.   No intraperitoneal rupture occurred;  Frozen pathology was consistent with adenocarcinoma; right tube and ovary normal in appearance; 3) normal bilateral pelvic and para-aortic lymph nodes and omentum; small and large bowel to palpation. This represented an optimal cytoreduction with no gross visible disease remaining.    10/09/2017 Tumor Marker   Patient's tumor was tested for the following markers: CA-125 Results of the tumor marker test revealed 68.7   10/15/2017 Adverse Reaction   She developed reaction to Paclitaxel.   10/15/2017 - 01/29/2018 Chemotherapy   She received carboplatin. Due to infusion reaction to Taxol with cycle 1, treatment was switched to carboplatin and Abraxane   11/03/2017 Procedure   Successful placement of a right internal jugular approach power injectable Port-A-Cath. The catheter is ready for immediate use.   11/27/2017 Tumor Marker   Patient's tumor was tested for the following markers: CA-125 Results of the tumor marker test revealed 27.7   12/12/2017 Genetic Testing   The patient had genetic testing due to a personal history of ovarian cancer and a family history of pancreatic cancer (and possibly breast  cancer).  The [facility] Hereditary Cancer Panel + tumor HRD analysis was ordered from the laboratory [facility]. The [facility]) gene panel offered by [employer] includes sequencing and deletion/duplication testing of the following 29 genes: APC, ATM, BARD1, BMPR1A, BRCA1, BRCA2, BRIP1, CHD1, CDK4, CDKN2A, CHEK2, EPCAM (large rearrangement only), HOXB13, MLH1, MSH2, MSH6, MUTYH, NBN, PALB2, PMS2, PTEN, RAD51C, RAD51D, SMAD4, STK11, and TP53. Sequencing was performed for select regions of POLE and POLD1, and large rearrangement analysis was performed for select regions of GREM1.  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 [id number] ([id number]).  The date of this test report is 12/12/2017.   01/08/2018 Tumor Marker   Patient's tumor was tested for the following markers: CA-125 Results of the tumor marker test revealed 17.5   03/02/2018 Tumor Marker   Patient's tumor was tested for the following markers: CA-125 Results of the tumor marker test revealed 11.4   03/14/2018 Imaging   MRCP 1. No worrisome pancreatic lesions are currently present. 2. Cholelithiasis. 3. Small benign-appearing bilateral renal cysts. 4. Midline anterior abdominal wall laxity seen on coronal images, potential small hernia in this vicinity without observed complication   08/09/6944 Procedure   Successful right IJ vein Port-A-Cath explant.   12/21/2019 Tumor Marker   Patient's tumor was tested for the following markers: CA-125 Results of the tumor marker test revealed 9.5.     REVIEW OF SYSTEMS:   Constitutional: Denies fevers, chills or abnormal weight loss Eyes: Denies blurriness of vision Ears, nose, mouth, throat, and face: Denies mucositis or sore throat Respiratory: Denies cough, dyspnea or wheezes Cardiovascular: Denies palpitation, chest discomfort or lower extremity swelling Gastrointestinal:  Denies nausea, heartburn or change in bowel habits Skin: Denies abnormal skin  rashes Lymphatics: Denies new lymphadenopathy or easy  bruising Neurological:Denies numbness, tingling or new weaknesses Behavioral/Psych: Mood is stable, no new changes  All other systems were reviewed with the patient and are negative.  I have reviewed the past medical history, past surgical history, social history and family history with the patient and they are unchanged from previous note.  ALLERGIES:  is allergic to bee venom; chlorhexidine; paclitaxel; bacitracin; iodine; and other.  MEDICATIONS:  Current Outpatient Medications  Medication Sig Dispense Refill  . loratadine (CLARITIN) 10 MG tablet Take 10 mg by mouth daily.    . AMBULATORY NON FORMULARY MEDICATION Medication Name: Turmeric with ginger 500 mg tablet-1 tablet by mouth daily    . amLODipine (NORVASC) 5 MG tablet Take 5 mg by mouth every evening.    Marland Kitchen anastrozole (ARIMIDEX) 1 MG tablet TAKE 1 TABLET BY MOUTH EVERY DAY 90 tablet 8  . Apple Cider Vinegar 600 MG CAPS Take 600 mg by mouth 2 (two) times daily. Takes in morning and in evening.    . Cholecalciferol (VITAMIN D3) 25 MCG TABS Take 1,000 Units by mouth daily.     Marland Kitchen ibuprofen (ADVIL,MOTRIN) 200 MG tablet Take 400 mg by mouth every 6 (six) hours as needed.    . Inulin (FIBER CHOICE PO) Take 10 g by mouth daily.    . Liniments (SALONPAS PAIN RELIEF PATCH EX) Place 1 patch onto the skin daily as needed (knee pain).    . Magnesium Gluconate 550 MG TABS Take 500 mg by mouth daily.     . Melatonin 5 MG TABS Take 5 mg by mouth daily.     . Misc Natural Products (GLUCOSAMINE CHOND MSM FORMULA) TABS Take 2 tablets by mouth daily.    . Multiple Vitamins-Minerals (HAIR SKIN & NAILS ADVANCED PO) Take 1 tablet by mouth daily.    Marland Kitchen ZINC-VITAMIN C PO Take 1,000 mg by mouth daily.     Current Facility-Administered Medications  Medication Dose Route Frequency Provider Last Rate Last Admin  . 0.9 %  sodium chloride infusion  500 mL Intravenous Once Milus Banister, MD         PHYSICAL EXAMINATION: ECOG PERFORMANCE STATUS: 1 - Symptomatic but completely ambulatory  Vitals:   12/21/19 0936  BP: 125/79  Pulse: 89  Resp: 18  Temp: 97.8 F (36.6 C)  SpO2: 92%   Filed Weights   12/21/19 0936  Weight: 215 lb 9.6 oz (97.8 kg)    GENERAL:alert, no distress and comfortable SKIN: skin color, texture, turgor are normal, no rashes or significant lesions EYES: normal, Conjunctiva are pink and non-injected, sclera clear OROPHARYNX:no exudate, no erythema and lips, buccal mucosa, and tongue normal  NECK: supple, thyroid normal size, non-tender, without nodularity LYMPH:  no palpable lymphadenopathy in the cervical, axillary or inguinal LUNGS: clear to auscultation and percussion with normal breathing effort HEART: regular rate & rhythm and no murmurs and no lower extremity edema ABDOMEN:abdomen soft, non-tender and normal bowel sounds Musculoskeletal:no cyanosis of digits and no clubbing  NEURO: alert & oriented x 3 with fluent speech, no focal motor/sensory deficits  LABORATORY DATA:  I have reviewed the data as listed    Component Value Date/Time   NA 141 12/21/2019 1000   NA 141 11/27/2017 0818   K 4.4 12/21/2019 1000   K 3.8 11/27/2017 0818   CL 104 12/21/2019 1000   CO2 25 12/21/2019 1000   CO2 25 11/27/2017 0818   GLUCOSE 101 (H) 12/21/2019 1000   GLUCOSE 90 11/27/2017 0818   BUN 18  12/21/2019 1000   BUN 12.9 11/27/2017 0818   CREATININE 1.01 (H) 12/21/2019 1000   CREATININE 0.8 11/27/2017 0818   CALCIUM 9.9 12/21/2019 1000   CALCIUM 9.9 11/27/2017 0818   PROT 7.5 12/21/2019 1000   PROT 7.0 11/27/2017 0818   ALBUMIN 4.1 12/21/2019 1000   ALBUMIN 3.8 11/27/2017 0818   AST 18 12/21/2019 1000   AST 11 11/27/2017 0818   ALT 22 12/21/2019 1000   ALT 11 11/27/2017 0818   ALKPHOS 81 12/21/2019 1000   ALKPHOS 68 11/27/2017 0818   BILITOT 0.4 12/21/2019 1000   BILITOT 0.41 11/27/2017 0818   GFRNONAA >60 12/21/2019 1000   GFRAA >60  12/21/2019 1000    No results found for: SPEP, UPEP  Lab Results  Component Value Date   WBC 6.6 12/21/2019   NEUTROABS 3.8 12/21/2019   HGB 13.6 12/21/2019   HCT 41.7 12/21/2019   MCV 87.2 12/21/2019   PLT 379 12/21/2019      Chemistry      Component Value Date/Time   NA 141 12/21/2019 1000   NA 141 11/27/2017 0818   K 4.4 12/21/2019 1000   K 3.8 11/27/2017 0818   CL 104 12/21/2019 1000   CO2 25 12/21/2019 1000   CO2 25 11/27/2017 0818   BUN 18 12/21/2019 1000   BUN 12.9 11/27/2017 0818   CREATININE 1.01 (H) 12/21/2019 1000   CREATININE 0.8 11/27/2017 0818      Component Value Date/Time   CALCIUM 9.9 12/21/2019 1000   CALCIUM 9.9 11/27/2017 0818   ALKPHOS 81 12/21/2019 1000   ALKPHOS 68 11/27/2017 0818   AST 18 12/21/2019 1000   AST 11 11/27/2017 0818   ALT 22 12/21/2019 1000   ALT 11 11/27/2017 0818   BILITOT 0.4 12/21/2019 1000   BILITOT 0.41 11/27/2017 0818

## 2019-12-22 NOTE — Telephone Encounter (Signed)
-----   Message from Heath Lark, MD sent at 12/22/2019  7:22 AM EST ----- Regarding: pls call and let her know all labs and CA-125 are normal

## 2019-12-22 NOTE — Assessment & Plan Note (Signed)
She is at risk of pancreatic cancer We discussed screening modality with endoscopic ultrasound with GI and MRCP Recent MRCP in April 2019 showed no evidence of pancreatic lesions She has appointment to see gastroenterologist for further management From the breast cancer standpoint, she is on Arimidex She has missed screening mammogram and MRI of the breast due to loss of insurance I recommend her to get screening mammogram reschedule now

## 2019-12-22 NOTE — Assessment & Plan Note (Signed)
She has completed adjuvant treatment She have no signs or symptoms to suggest cancer recurrence Tumor marker is within normal limits She will continue alternate appointment between GYN surgeon and myself every few months I will see her in 6 months for further follow-up

## 2019-12-22 NOTE — Telephone Encounter (Signed)
Called and given below message. She verbalized understanding. 

## 2019-12-22 NOTE — Assessment & Plan Note (Signed)
She has menopausal symptoms with hot flashes We discussed management but the patient is comfortable without additional therapy She will continue Arimidex for now I recommend screening bone density scan in the future but I will wait until I see her in her next visit The critical tests today are blood work and screening mammogram

## 2019-12-24 ENCOUNTER — Telehealth: Payer: Self-pay | Admitting: *Deleted

## 2019-12-24 NOTE — Telephone Encounter (Signed)
Patient moved appt to April

## 2020-01-03 ENCOUNTER — Ambulatory Visit: Payer: BC Managed Care – PPO | Admitting: Gynecologic Oncology

## 2020-01-28 ENCOUNTER — Other Ambulatory Visit: Payer: Self-pay

## 2020-01-28 ENCOUNTER — Ambulatory Visit
Admission: RE | Admit: 2020-01-28 | Discharge: 2020-01-28 | Disposition: A | Discharge status: Home / Self Care | Payer: 59 | Source: Ambulatory Visit | Attending: Hematology and Oncology | Admitting: Hematology and Oncology

## 2020-01-28 DIAGNOSIS — Z1231 Encounter for screening mammogram for malignant neoplasm of breast: Secondary | ICD-10-CM

## 2020-02-02 ENCOUNTER — Encounter: Payer: Self-pay | Admitting: Genetic Counselor

## 2020-03-14 ENCOUNTER — Telehealth: Payer: Self-pay | Admitting: *Deleted

## 2020-03-14 NOTE — Telephone Encounter (Signed)
Pt called to reschedule appt. w/ Dr. Denman George. Appt rescheduled for Friday 4/23 along with Lab appt.

## 2020-03-24 ENCOUNTER — Ambulatory Visit: Payer: 59 | Admitting: Gynecologic Oncology

## 2020-03-30 ENCOUNTER — Telehealth: Payer: Self-pay | Admitting: *Deleted

## 2020-03-30 NOTE — Telephone Encounter (Signed)
Called and moved the patient's appts from the afternoon to the morning

## 2020-03-31 ENCOUNTER — Inpatient Hospital Stay: Payer: 59

## 2020-03-31 ENCOUNTER — Inpatient Hospital Stay: Payer: 59 | Attending: Hematology and Oncology | Admitting: Gynecologic Oncology

## 2020-03-31 ENCOUNTER — Other Ambulatory Visit: Payer: Self-pay

## 2020-03-31 ENCOUNTER — Encounter: Payer: Self-pay | Admitting: Gynecologic Oncology

## 2020-03-31 VITALS — BP 135/77 | HR 83 | Temp 98.5°F | Resp 18 | Ht 64.0 in | Wt 216.7 lb

## 2020-03-31 DIAGNOSIS — Z79810 Long term (current) use of selective estrogen receptor modulators (SERMs): Secondary | ICD-10-CM | POA: Diagnosis not present

## 2020-03-31 DIAGNOSIS — Z170 Estrogen receptor positive status [ER+]: Secondary | ICD-10-CM | POA: Insufficient documentation

## 2020-03-31 DIAGNOSIS — Z90722 Acquired absence of ovaries, bilateral: Secondary | ICD-10-CM | POA: Insufficient documentation

## 2020-03-31 DIAGNOSIS — L03311 Cellulitis of abdominal wall: Secondary | ICD-10-CM

## 2020-03-31 DIAGNOSIS — Z79899 Other long term (current) drug therapy: Secondary | ICD-10-CM | POA: Diagnosis not present

## 2020-03-31 DIAGNOSIS — C562 Malignant neoplasm of left ovary: Secondary | ICD-10-CM

## 2020-03-31 DIAGNOSIS — T2102XA Burn of unspecified degree of abdominal wall, initial encounter: Secondary | ICD-10-CM | POA: Diagnosis not present

## 2020-03-31 DIAGNOSIS — Z9221 Personal history of antineoplastic chemotherapy: Secondary | ICD-10-CM | POA: Insufficient documentation

## 2020-03-31 DIAGNOSIS — Z9079 Acquired absence of other genital organ(s): Secondary | ICD-10-CM | POA: Diagnosis not present

## 2020-03-31 DIAGNOSIS — C50919 Malignant neoplasm of unspecified site of unspecified female breast: Secondary | ICD-10-CM | POA: Diagnosis not present

## 2020-03-31 DIAGNOSIS — Z90710 Acquired absence of both cervix and uterus: Secondary | ICD-10-CM | POA: Insufficient documentation

## 2020-03-31 MED ORDER — AMLODIPINE BESYLATE 5 MG PO TABS: 5.0000 mg | ORAL_TABLET | Freq: Every evening | ORAL | 3 refills | Status: DC

## 2020-03-31 MED ORDER — CEPHALEXIN 500 MG PO CAPS: 500.0000 mg | ORAL_CAPSULE | Freq: Four times a day (QID) | ORAL | 0 refills | Status: DC
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
## 2020-03-31 NOTE — Progress Notes (Signed)
Follow-up Note: Gyn-Onc  Sierra Gray 59 y.o. female  CC:  Chief Complaint  Patient presents with  . 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 S/p adjuvant chemotherapy with 6 cycles carboplatin and paclitaxel completed 01/29/18.   Ovarian cancer history:  Complete clinical response. Recommendation is for continued 6 monthly surveillance visits until February, 2023.  ATM mutation:  She sees Dr Alvy Bimler for prophylactic anastrazole for breast cancer risk reduction related to her ATM mutation. Pancreatic MRI for screening purposes was normal in April, 2019.  Abdominal wall burn:  I am concerned that she has cellulitis. She is not a diabetic and has no signs or symptoms of serious or systemic infection. I think a brief course of outpatient antibiotics is reasonable to try. I prescribed Keflex today. I instructed her to notify our office next week if she develops progression in redness. If this occurs, she should be seen by a plastic surgeon or general surgeon as I believe she may require debridement in that event.   HPI: Patient was seen in consultation at the request of Dr. Nicoletta Dress.  Patient is a very pleasant 59 year old gravida 1 para 0 who in late August after having her [place] visit set up from the dental care and felt some abdominal discomfort. The pain increased and she subsequently that evening had a temperature of 102. This was corresponding with increased abdominal pain and then the abdominal pain dissipated. Since then she's had a occasional pain and last Monday. Due to the persistent discomfort she saw her primary care physician. She describes the pain as a occurring in both her right and left lower quadrants. She did not notice any upper abdominal discomfort. There's been no change in her bowel or bladder habits. She does endorse  approximately a 20-25 pound weight loss in the last 6-9 months. Much of this has been intentional. An Thanksgiving of last year to the Christmas holiday she lost 10 pounds which was not intentional but since then her weight loss has been intentional and she feels that she's eating well.  She underwent a CT scan on 08/19/2017. It revealed a trace amount of ascites in the peritoneal cavity seen prior dominant weight in the lateral paracolic gutter and along the liver capsule. There was no nodule letter peritoneal enhancement identified to suggest peritoneal carcinomatosis. There really does state that there is a mass that is cystic and solid that replaces the entire uterus. It extends caudally to the cervix and appears to invade the upper one third of the vagina. The caudal margin of the neoplasm made directly invading the posterior bladder dome to the medial thickness is normal. No fat separate the mass from the adjacent upper third of the rectum or from the mid sigmoid. The mass measures 12 x 13 x 20 cm in size and posteriorly and laterally displaces the right ovary. The left ovary was not competent to be identified. Of note the patient had undergone a laparoscopic hysterectomy at [facility] approximate 14 years ago secondary to fibroids.  She has not yet had tumor markers drawn. She states she's never had a colonoscopy. She's not sure if her last mammogram was in 2017 but she thinks she might be due this year. She does have family history of cancer her father died of pancreatic cancer in his late 64s. His maternal aunt with breast cancer in her 5s.  A paternal grandfather died of colon cancer at 49. She does endorse allergies to chlorhexidine which causes a rash. This was an issue when she had knee surgery on the right. She believes Betadine is okay.  On 09/11/17 she underwent ex lap, BSO, omentectomy, pelvic and para-aortic lymphadenectomy, radical tumor debulking. Intraoperative findings were significant for a  20+cm left ovarian mass with nromal right ovary and omentum. No extraovarian disease was seen. Frozen confirmed adenocarcinoma.  There was no gross residual disease at completion of the surgery.  Final pathology confirmed a 20cm grade 1 endometrioid left ovarian adenocarcinoma with surface involvement but negative washings (stage IC2).  All other resected tissue (including omentum, nodes and peritoneal biopsy and washings) were negative.  In accordance with NCCN guidelines she was recommended adjuvant carboplatin and paclitaxel chemotherapy and her case was discussed at multi-disciplinary tumor conference.  She went on to complete adjuvant chemotherapy with 6 cycles of carboplatin and paclitaxel in February, 2019. She tolerated therapy well and had a complete clinical response.   She presents today for follow-up. She had a ventral abdominal hernia for which she is had surgery at [facility] in June, 2019.  She underwent a laparoscopic assisted right hemicolectomy with Dr Hassell Done on 09/11/18. It showed benign tubular adenomas.  CA 125 was normal at 10 on 08/31/18.  CA 125 was normal at 9.7 on 11/21/19.  Interval Hx:  On 12/21/19 CA 125 was normal at 9.5.  She has no symptoms concerning for recurrent cancer.  She reported that she developed a bad sunburn on her abdomen 1 week ago which caused blistering and she is still requiring dressings to this.   Review of Systems: Constitutional:  Denies fever.  Skin: + skin burn on abdomen Cardiovascular: No chest pain, shortness of breath, or edema  Pulmonary: No cough or wheeze.  Gastro Intestinal: Reporting intermittent lower abdominal soreness.  No nausea, vomiting, constipation, or diarrhea reported. No bright red blood per rectum or change in bowel movement.  Genitourinary: Denies vaginal bleeding and discharge.  Musculoskeletal: no masses   Psychology: No complaints  Current Meds:  Outpatient Encounter Medications as of 03/31/2020  Medication Sig   . AMBULATORY NON FORMULARY MEDICATION Medication Name: Turmeric with ginger 500 mg tablet-1 tablet by mouth daily  . amLODipine (NORVASC) 5 MG tablet Take 1 tablet (5 mg total) by mouth every evening.  Marland Kitchen anastrozole (ARIMIDEX) 1 MG tablet TAKE 1 TABLET BY MOUTH EVERY DAY  . Apple Cider Vinegar 600 MG CAPS Take 600 mg by mouth 2 (two) times daily. Takes in morning and in evening.  . Cholecalciferol (VITAMIN D3) 25 MCG TABS Take 1,000 Units by mouth daily.   Marland Kitchen ibuprofen (ADVIL,MOTRIN) 200 MG tablet Take 400 mg by mouth every 6 (six) hours as needed.  . Inulin (FIBER CHOICE PO) Take 10 g by mouth daily.  . Liniments (SALONPAS PAIN RELIEF PATCH EX) Place 1 patch onto the skin daily as needed (knee pain).  Marland Kitchen loratadine (CLARITIN) 10 MG tablet Take 10 mg by mouth daily.  . Magnesium Gluconate 550 MG TABS Take 500 mg by mouth daily.   . Melatonin 5 MG TABS Take 5 mg by mouth daily.   . Multiple Vitamins-Minerals (HAIR SKIN & NAILS ADVANCED PO) Take 1 tablet by mouth daily.  Marland Kitchen ZINC-VITAMIN C PO Take 1,000 mg by mouth daily.  . [DISCONTINUED] amLODipine (NORVASC) 5 MG tablet Take 5 mg by mouth every evening.  . cephALEXin (KEFLEX) 500 MG capsule Take 1 capsule (500 mg  total) by mouth 4 (four) times daily.  . Misc Natural Products (GLUCOSAMINE CHOND MSM FORMULA) TABS Take 2 tablets by mouth daily.   Facility-Administered Encounter Medications as of 03/31/2020  Medication  . 0.9 %  sodium chloride infusion    Allergy:  Allergies  Allergen Reactions  . Bee Venom Anaphylaxis  . Chlorhexidine Rash    Surgical prep wash  . Paclitaxel Other (See Comments)    Experienced severe back pain and flushing  . Bacitracin Rash  . Iodine Rash  . Other Rash    Ortho prep SURGICAL GLUE--RASH, VERY BOTHERSOME, REQUIRED BENADRYL     Social Hx:   Social History   Socioeconomic History  . Marital status: Single    Spouse name: Not on file  . Number of children: Not on file  . Years of education: Not on  file  . Highest education level: Not on file  Occupational History  . Occupation: Freight forwarder  Tobacco Use  . Smoking status: Never Smoker  . Smokeless tobacco: Never Used  Substance and Sexual Activity  . Alcohol use: Yes    Comment: Socially drinks wine/beer  . Drug use: No  . Sexual activity: Never  Other Topics Concern  . Not on file  Social History Narrative  . Not on file   Social Determinants of Health   Financial Resource Strain:   . Difficulty of Paying Living Expenses:   Food Insecurity:   . Worried About Charity fundraiser in the Last Year:   . Arboriculturist in the Last Year:   Transportation Needs:   . Film/video editor (Medical):   Marland Kitchen Lack of Transportation (Non-Medical):   Physical Activity:   . Days of Exercise per Week:   . Minutes of Exercise per Session:   Stress:   . Feeling of Stress :   Social Connections:   . Frequency of Communication with Friends and Family:   . Frequency of Social Gatherings with Friends and Family:   . Attends Religious Services:   . Active Member of Clubs or Organizations:   . Attends Archivist Meetings:   Marland Kitchen Marital Status:   Intimate Partner Violence:   . Fear of Current or Ex-Partner:   . Emotionally Abused:   Marland Kitchen Physically Abused:   . Sexually Abused:     Past Surgical Hx:  Past Surgical History:  Procedure Laterality Date  . ABDOMINAL HYSTERECTOMY  2009   Total laparoscopic hyst for fibroids  . BILATERAL SALPINGECTOMY Bilateral 09/11/2017   Procedure: BILATERAL SALPINGECTOMY WITH OOPHERECTOMY;  Surgeon: Everitt Amber, MD;  Location: [facility];  Service: Gynecology;  Laterality: Bilateral;  . BREAST BIOPSY  12/16/2014   2 lumps in  right breast removed  . BREAST LUMPECTOMY Left   . EXCISION OF SKIN TAG N/A 09/11/2017   Procedure: EXCISION OF SKIN TAG ON THIGH;  Surgeon: Everitt Amber, MD;  Location: [facility];  Service: Gynecology;  Laterality: N/A;  . IR FLUORO GUIDE PORT INSERTION RIGHT  11/03/2017  . IR  REMOVAL TUN ACCESS W/ PORT W/O FL MOD SED  03/17/2018  . IR US GUIDE VASC ACCESS RIGHT  11/03/2017  . KNEE SURGERY Right    x3  . LAPAROSCOPIC PARTIAL RIGHT COLECTOMY Right 09/11/2018   Procedure: LAPAROSCOPIC ASSISTED  PARTIAL RIGHT HEMI-COLECTOMY ERAS PATHWAY;  Surgeon: Johnathan Hausen, MD;  Location: [facility];  Service: General;  Laterality: Right;  . LAPAROTOMY Bilateral 09/11/2017   Procedure: EXPLORATORY LAPAROTOMY;  Surgeon: Everitt Amber, MD;  Location: [facility];  Service: Gynecology;  Laterality: Bilateral;  . LAPAROTOMY WITH STAGING N/A 09/11/2017   Procedure: PELVIC AND PARA AORTIC LYMPH NODE  DISSECTION;  Surgeon: Everitt Amber, MD;  Location: [facility];  Service: Gynecology;  Laterality: N/A;  . OMENTECTOMY N/A 09/11/2017   Procedure: OMENTECTOMY;  Surgeon: Everitt Amber, MD;  Location: [facility];  Service: Gynecology;  Laterality: N/A;  . REDUCTION MAMMAPLASTY    . WISDOM TOOTH EXTRACTION      Past Medical Hx:  Past Medical History:  Diagnosis Date  . Anemia    hx of  . Arthritis    oa knees  . Breast lump in female    benign  . Family history of pancreatic cancer 11/27/2017  . Family history of prostate cancer 11/27/2017  . Fibroids   . Hyperlipidemia   . Hypertension   . Migraine    hx of   . Ovarian cancer ([place])   . Pelvic mass in female   . Pelvic mass in female   . PONV (postoperative nausea and vomiting)    nausea only  . Sleep apnea     Oncology Hx:  Oncology History Overview Note  Genetic testing came back positive for ATM variant   Left ovarian epithelial cancer ([place])  08/19/2017 Imaging   She had outside CT scan which showed large abdominal mass   08/27/2017 Tumor Marker   Patient's tumor was tested for the following markers: CA-125 Results of the tumor marker test revealed 904.5   09/11/2017 Pathology Results   1. Ovary and fallopian tube, left ENDOMETRIOID CARCINOMA WITH SQUAMOUS MORULES, FIGO GRADE 1 (20.0 CM) TUMOR IS LIMITED TO LEFT OVARY WITH SURFACE  INVOLVEMENT (PT1C2) FALLOPIAN TUBE: FOCAL SURFACE WITH CHRONIC INFLAMMATION 2. Soft tissue, biopsy, right medial thigh MATURE LIPOMA 3. Ovary and fallopian tube, right ENDOMETRIOMA AND SIMPLE SEROUS CYST WITH FALLOPIAN TUBE ADHESION NEGATIVE FOR MALIGNANCY 4. Omentum, resection for tumor BENIGN OMENTUM NEGATIVE FOR CARCINOMA 5. Lymph nodes, regional resection, right pelvic EIGHT BENIGN LYMPH NODES (0/8) 6. Lymph nodes, regional resection, left pelvic SEVEN BENIGN LYMPH NODES (0/7) 7. Lymph node, biopsy, right para-aortic FOUR BENIGN LYMPH NODES (0/4) 8. Lymph node, biopsy, left para-aortic ONE BENIGN LYMPH NODE (0/1) 9. Peritoneum, biopsy, left abdominal BENIGN FIBROMUSCULAR TISSUE 10. Peritoneum, biopsy, right abdominal BENIGN FIBROMUSCULAR TISSUE WITH SEROSITIS 11. Peritoneum, biopsy, pelvic FIBROADIPOSE TISSUE WITH SEROSITIS  Specimen(s): Ovary and fallopian tube Procedure: (including lymph node sampling): salpingo-oophorectomy Primary tumor site (including laterality): Left ovary Ovarian surface involvement: Yes Ovarian capsule intact without fragmentation: intact Maximum tumor size (cm): 20.0 cm Histologic type: Endometrioid carcinoma Grade: 1 Peritoneal implants: (specify invasive or non-invasive): Negative Pelvic extension (list additional structures on separate lines and if involved): Negative Lymph nodes: number examined 20 ; number positive 0 TNM code: pT1c2, pN0, pMx FIGO Stage (based on pathologic findings, needs clinical correlation): IC2   09/11/2017 Surgery   Preoperative Diagnosis: 1. left adnexal mass.  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   Procedure(s) Performed: 1. Exploratory laparotomy with bilateral salpingo-oophorectomy, pelvic and para-aortic lymph node dissection, omentectomy and radical debulking for ovarian cancer (CPT [phone])  Surgeon: Everitt Amber, M.D. Operative Findings:20 cm left ovarian  mass.   No intraperitoneal rupture occurred;  Frozen pathology was consistent with adenocarcinoma; right tube and ovary normal in appearance; 3) normal bilateral pelvic and para-aortic lymph nodes and omentum; small and large bowel to palpation. This represented an optimal cytoreduction with no gross visible disease remaining.  10/09/2017 Tumor Marker   Patient's tumor was tested for the following markers: CA-125 Results of the tumor marker test revealed 68.7   10/15/2017 Adverse Reaction   She developed reaction to Paclitaxel.   10/15/2017 - 01/29/2018 Chemotherapy   She received carboplatin. Due to infusion reaction to Taxol with cycle 1, treatment was switched to carboplatin and Abraxane   11/03/2017 Procedure   Successful placement of a right internal jugular approach power injectable Port-A-Cath. The catheter is ready for immediate use.   11/27/2017 Tumor Marker   Patient's tumor was tested for the following markers: CA-125 Results of the tumor marker test revealed 27.7   12/12/2017 Genetic Testing   The patient had genetic testing due to a personal history of ovarian cancer and a family history of pancreatic cancer (and possibly breast cancer).  The [facility] Hereditary Cancer Panel + tumor HRD analysis was ordered from the laboratory [facility]. The [facility] gene panel offered by [employer] includes sequencing and deletion/duplication testing of the following 29 genes: APC, ATM, BARD1, BMPR1A, BRCA1, BRCA2, BRIP1, CHD1, CDK4, CDKN2A, CHEK2, EPCAM (large rearrangement only), HOXB13, MLH1, MSH2, MSH6, MUTYH, NBN, PALB2, PMS2, PTEN, RAD51C, RAD51D, SMAD4, STK11, and TP53. Sequencing was performed for select regions of POLE and POLD1, and large rearrangement analysis was performed for select regions of GREM1.  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 [id number] (p.[id number]).  The date of this test report is 12/12/2017.   01/08/2018 Tumor Marker    Patient's tumor was tested for the following markers: CA-125 Results of the tumor marker test revealed 17.5   03/02/2018 Tumor Marker   Patient's tumor was tested for the following markers: CA-125 Results of the tumor marker test revealed 11.4   03/14/2018 Imaging   MRCP 1. No worrisome pancreatic lesions are currently present. 2. Cholelithiasis. 3. Small benign-appearing bilateral renal cysts. 4. Midline anterior abdominal wall laxity seen on coronal images, potential small hernia in this vicinity without observed complication   01/15/9484 Procedure   Successful right IJ vein Port-A-Cath explant.   12/21/2019 Tumor Marker   Patient's tumor was tested for the following markers: CA-125 Results of the tumor marker test revealed 9.5.     Family Hx:  Family History  Problem Relation Age of Onset  . Hypertension Mother   . Hyperlipidemia Mother   . Pancreatic cancer Father 30       died 47 months after dx  . Hypertension Father        questionable  . Prostate cancer Father 70       had surgery for it  . Breast cancer Maternal Aunt        dx 67's- she had surgery, think it was cancer but could have been just a lump removed  . Heart disease Maternal Aunt   . Lung cancer Maternal Aunt        primary  . Heart attack Maternal Grandmother   . Diabetes Maternal Grandfather   . Dementia Maternal Grandfather   . Osteoporosis Paternal Grandmother   . Colon cancer Paternal Grandfather 54  . Stomach cancer Neg Hx   . Esophageal cancer Neg Hx     Vitals:  Blood pressure 135/77, pulse 83, temperature 98.5 F (36.9 C), temperature source Temporal, resp. rate 18, height '5\' 4"'$  (1.626 m), weight 216 lb 11.2 oz (98.3 kg), SpO2 100 %.  Physical Exam: Well-nourished well-developed female in no acute distress.  Neck: Supple, no lymphadenopathy, no thyromegaly.  Lungs: Clear to auscultation  bilaterally.  Cardiac: Regular rate and rhythm.  Abdomen: Obese, soft, nontender, nondistended. There  is an irregularly shaped eschar measuring approximately 6x4cm on the lower right abdominal wall. There is surrounding erythema which blanches. There is some fibrinous debris present at the edges of the escar.   Groins: No lymphadenopathy.  Extremities no edema.  Pelvic: no palpable masses in pelvis on vaginal bimanual exam.    Thereasa Solo, MD 03/31/2020, 11:18 AM

## 2020-04-01 LAB — CA 125: Cancer Antigen (CA) 125: 10 U/mL (ref 0.0–38.1)

## 2020-04-03 ENCOUNTER — Telehealth: Payer: Self-pay

## 2020-04-03 NOTE — Telephone Encounter (Signed)
Spoke with pt to inform that CA 125 results are within normal range per APP (10.0).  Pt voiced understanding and had no questions or concerns at this time.

## 2020-05-10 IMAGING — MG DIGITAL SCREENING BILAT W/ TOMO W/ CAD
6 of 10 series · 6 of 30 positions shown · non-contrast
Comparison: Previous exam(s).

CLINICAL DATA: Screening.

EXAM:
DIGITAL SCREENING BILATERAL MAMMOGRAM WITH TOMO AND CAD

[L MLO synth-2D]
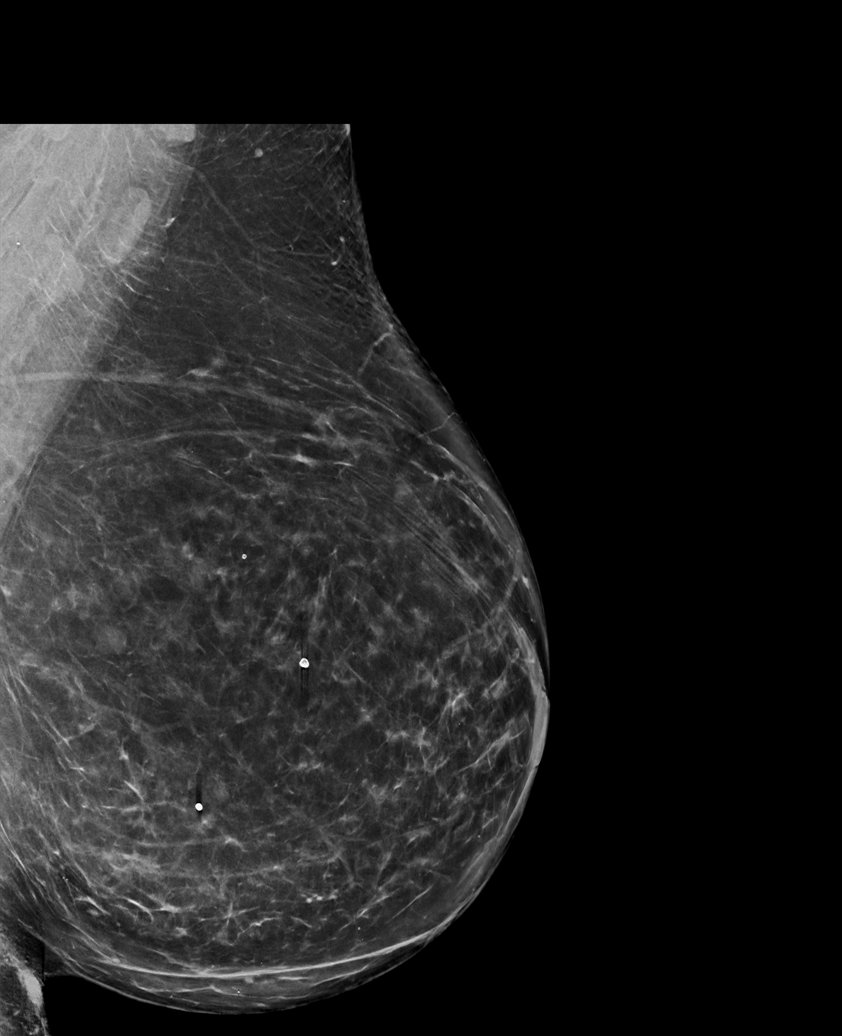

[R CC synth-2D (1 of 2)]
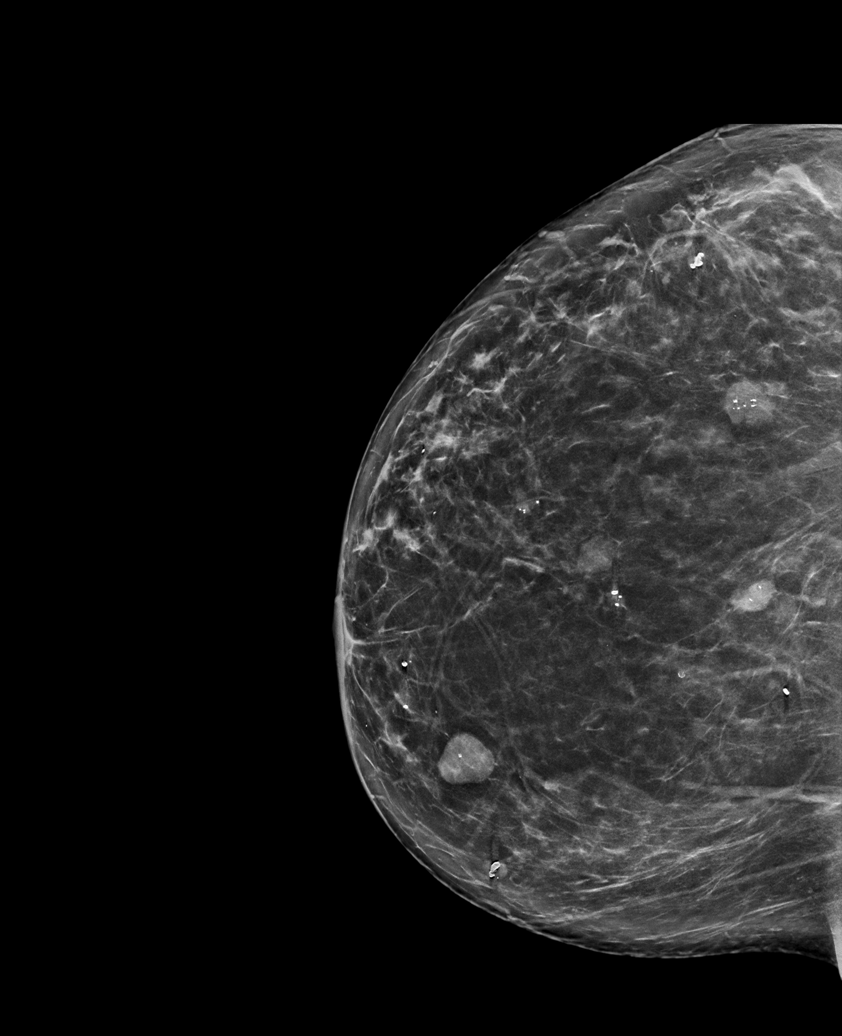

[L CC synth-2D]
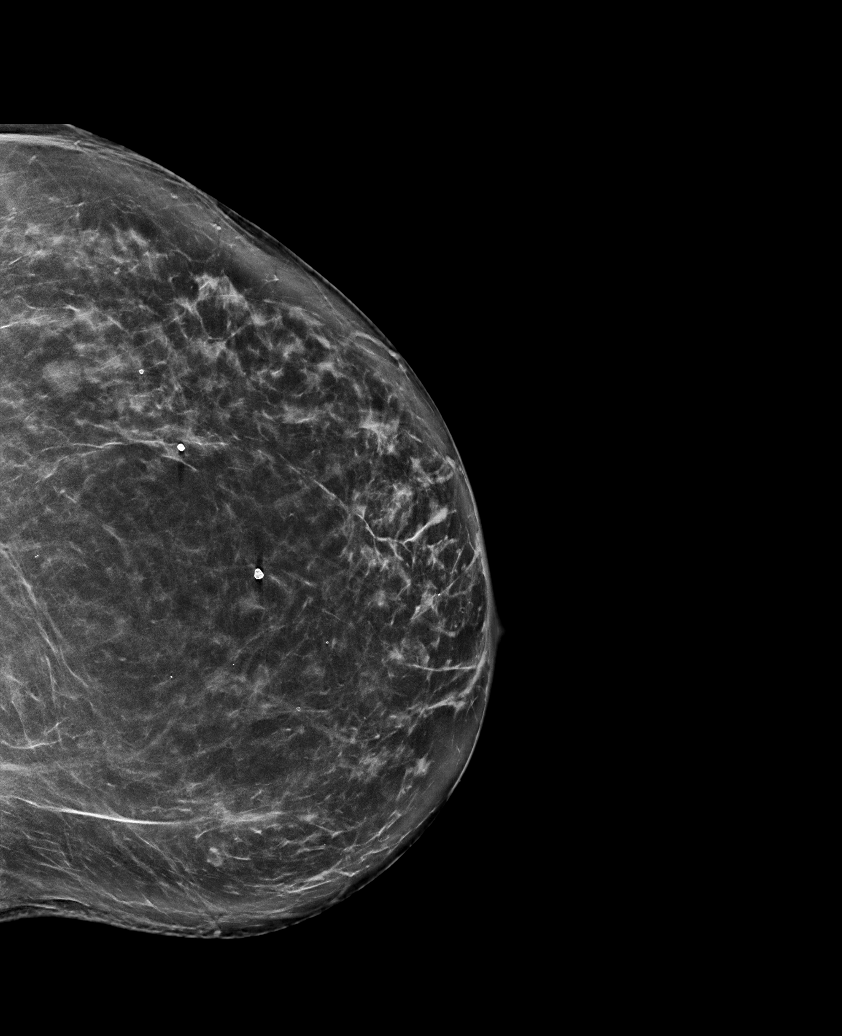

[R CC synth-2D (2 of 2)]
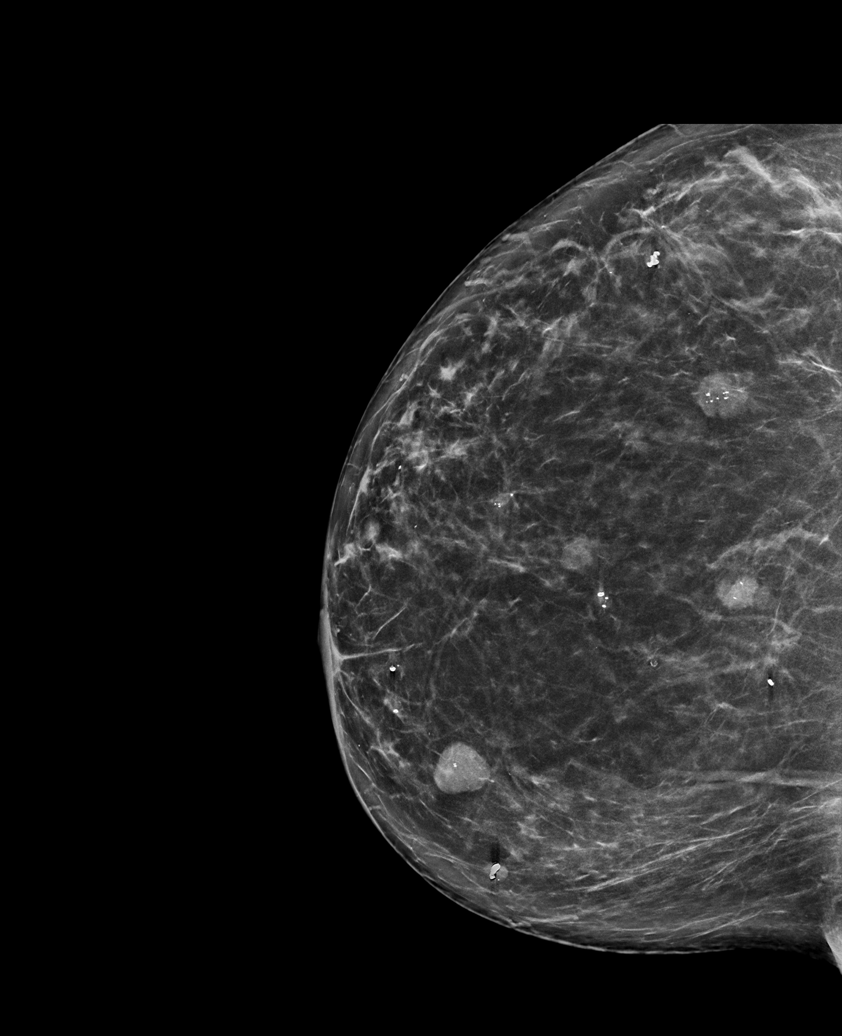

[R MLO synth-2D]
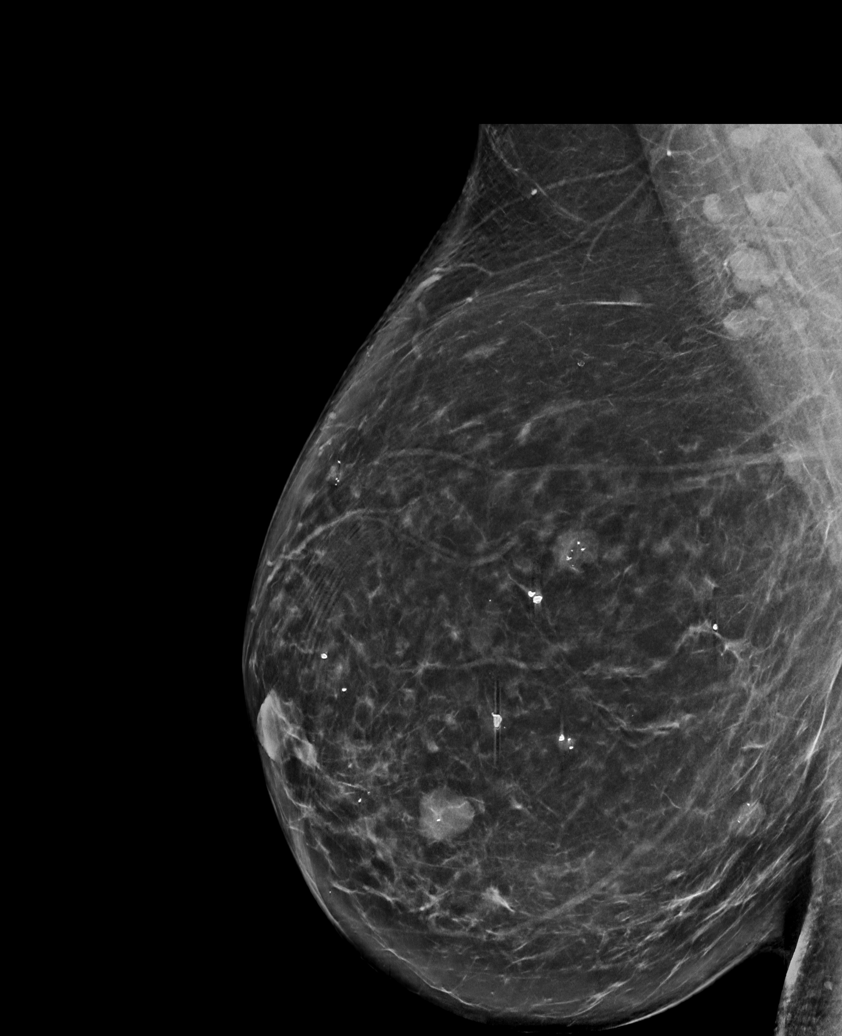

[R CC tomo · tomo slice 43/84.0]
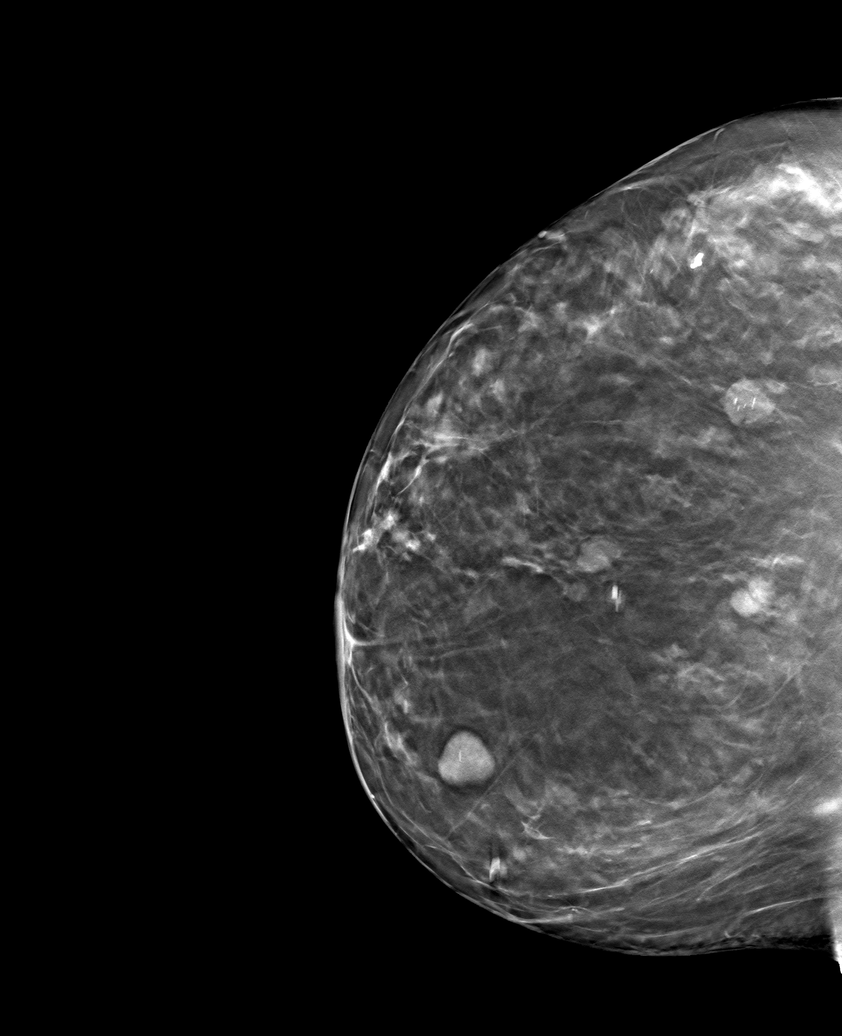

[6 of 30 positions shown; findings below may reference images not displayed]

ACR Breast Density Category b: There are scattered areas of
fibroglandular density.
FINDINGS: There are no findings suspicious for malignancy. Images were
processed with CAD.
IMPRESSION: No mammographic evidence of malignancy. A result letter of this
screening mammogram will be mailed directly to the patient.

RECOMMENDATION:
Screening mammogram in one year. (Code:[id number])

BI-RADS CATEGORY  1: Negative.

## 2020-06-19 ENCOUNTER — Inpatient Hospital Stay: Payer: 59 | Attending: Hematology and Oncology | Admitting: Hematology and Oncology

## 2020-06-19 ENCOUNTER — Other Ambulatory Visit: Payer: Self-pay

## 2020-06-19 ENCOUNTER — Other Ambulatory Visit: Payer: Self-pay | Admitting: Hematology and Oncology

## 2020-06-19 ENCOUNTER — Inpatient Hospital Stay: Payer: 59

## 2020-06-19 VITALS — BP 120/98 | HR 81 | Temp 98.7°F | Resp 18 | Ht 64.0 in | Wt 215.8 lb

## 2020-06-19 DIAGNOSIS — C562 Malignant neoplasm of left ovary: Secondary | ICD-10-CM

## 2020-06-19 DIAGNOSIS — M8580 Other specified disorders of bone density and structure, unspecified site: Secondary | ICD-10-CM

## 2020-06-19 DIAGNOSIS — Z1501 Genetic susceptibility to malignant neoplasm of breast: Secondary | ICD-10-CM

## 2020-06-19 DIAGNOSIS — E669 Obesity, unspecified: Secondary | ICD-10-CM

## 2020-06-19 DIAGNOSIS — Z79899 Other long term (current) drug therapy: Secondary | ICD-10-CM | POA: Diagnosis not present

## 2020-06-19 DIAGNOSIS — Z1589 Genetic susceptibility to other disease: Secondary | ICD-10-CM | POA: Diagnosis not present

## 2020-06-19 DIAGNOSIS — Z800 Family history of malignant neoplasm of digestive organs: Secondary | ICD-10-CM

## 2020-06-19 DIAGNOSIS — Z1509 Genetic susceptibility to other malignant neoplasm: Secondary | ICD-10-CM

## 2020-06-19 LAB — CBC WITH DIFFERENTIAL/PLATELET
Abs Immature Granulocytes: 0.01 10*3/uL (ref 0.00–0.07)
Basophils Absolute: 0.1 10*3/uL (ref 0.0–0.1)
Basophils Relative: 1 %
Eosinophils Absolute: 0.3 10*3/uL (ref 0.0–0.5)
Eosinophils Relative: 4 %
HCT: 41.7 % (ref 36.0–46.0)
Hemoglobin: 13.4 g/dL (ref 12.0–15.0)
Immature Granulocytes: 0 %
Lymphocytes Relative: 24 %
Lymphs Abs: 1.6 10*3/uL (ref 0.7–4.0)
MCH: 28.2 pg (ref 26.0–34.0)
MCHC: 32.1 g/dL (ref 30.0–36.0)
MCV: 87.6 fL (ref 80.0–100.0)
Monocytes Absolute: 0.6 10*3/uL (ref 0.1–1.0)
Monocytes Relative: 10 %
Neutro Abs: 4 10*3/uL (ref 1.7–7.7)
Neutrophils Relative %: 61 %
Platelets: 357 10*3/uL (ref 150–400)
RBC: 4.76 MIL/uL (ref 3.87–5.11)
RDW: 14.7 % (ref 11.5–15.5)
WBC: 6.6 10*3/uL (ref 4.0–10.5)
nRBC: 0 % (ref 0.0–0.2)

## 2020-06-19 LAB — COMPREHENSIVE METABOLIC PANEL
ALT: 25 U/L (ref 0–44)
AST: 22 U/L (ref 15–41)
Albumin: 4.3 g/dL (ref 3.5–5.0)
Alkaline Phosphatase: 76 U/L (ref 38–126)
Anion gap: 12 (ref 5–15)
BUN: 12 mg/dL (ref 6–20)
CO2: 25 mmol/L (ref 22–32)
Calcium: 10.5 mg/dL — ABNORMAL HIGH (ref 8.9–10.3)
Chloride: 105 mmol/L (ref 98–111)
Creatinine, Ser: 1.01 mg/dL — ABNORMAL HIGH (ref 0.44–1.00)
GFR calc Af Amer: >60 mL/min (ref >60)
GFR calc non Af Amer: >60 mL/min (ref >60)
Glucose, Bld: 111 mg/dL — ABNORMAL HIGH (ref 70–99)
Potassium: 4.5 mmol/L (ref 3.5–5.1)
Sodium: 142 mmol/L (ref 135–145)
Total Bilirubin: 0.8 mg/dL (ref 0.3–1.2)
Total Protein: 7.7 g/dL (ref 6.5–8.1)

## 2020-06-20 ENCOUNTER — Telehealth: Payer: Self-pay

## 2020-06-20 ENCOUNTER — Encounter: Payer: Self-pay | Admitting: Hematology and Oncology

## 2020-06-20 ENCOUNTER — Telehealth: Payer: Self-pay | Admitting: Oncology

## 2020-06-20 ENCOUNTER — Encounter (INDEPENDENT_AMBULATORY_CARE_PROVIDER_SITE_OTHER): Payer: Self-pay

## 2020-06-20 DIAGNOSIS — E669 Obesity, unspecified: Secondary | ICD-10-CM | POA: Insufficient documentation

## 2020-06-20 LAB — CA 125: Cancer Antigen (CA) 125: 12.5 U/mL (ref 0.0–38.1)

## 2020-06-20 NOTE — Telephone Encounter (Signed)
Pt. Scheduled for Bone density  at [facility] on 09/01/20 at 7am left message on voicemail about appointment as well as letting her know to stop any calcium supplements and multi vitamins two days before appointment.

## 2020-06-20 NOTE — Assessment & Plan Note (Signed)
She has strong family history of pancreatic cancer and genetic predisposition to pancreatic cancer She has not seen GI service for some time Recommend she call them for further follow-up

## 2020-06-20 NOTE — Assessment & Plan Note (Signed)
She is at risk of osteopenia due to chemotherapy exposure, ongoing treatment with Arimidex and postmenopausal status I recommend referral to get bone density scan

## 2020-06-20 NOTE — Telephone Encounter (Signed)
-----   Message from Heath Lark, MD sent at 06/20/2020  9:28 AM EDT ----- Pls let her know CA-125 is ok Also pls help schedule her bone density scan

## 2020-06-20 NOTE — Assessment & Plan Note (Signed)
She has completed adjuvant treatment She have no signs or symptoms to suggest cancer recurrence Tumor marker is pending, will call her with results She will continue alternate appointment between GYN surgeon and myself every few months I will see her in 6 months for further follow-up

## 2020-06-20 NOTE — Telephone Encounter (Signed)
Left a message with appointment to follow up with Dr. Denman George on 09/22/20 at 1:45.

## 2020-06-20 NOTE — Assessment & Plan Note (Signed)
She is due for MRI of the breast for high risk breast cancer screening

## 2020-06-20 NOTE — Progress Notes (Signed)
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 She have no signs or symptoms to suggest cancer recurrence Tumor marker is pending, will call her with results She will continue alternate appointment between GYN surgeon and myself every few months I will see her in 6 months for further follow-up  Class II obesity We discussed the importance of weight loss and the risk of obesity dealing with breast cancer and ovarian cancer She is interested to be referred to medical weight management center  Family history of pancreatic cancer She has strong family history of pancreatic cancer and genetic predisposition to pancreatic cancer She has not seen GI service for some time Recommend she call them for further follow-up  Osteopenia She is at risk of osteopenia due to chemotherapy exposure, ongoing treatment with Arimidex and postmenopausal status I recommend referral to get bone density scan  Monoallelic mutation of ATM gene She is due for MRI of the breast for high risk breast cancer screening   Orders Placed This Encounter  Procedures  . MR Breast Bilateral W Contrast    Standing Status:   Future    Standing Expiration Date:   06/19/2021    Order Specific Question:   If indicated for the ordered procedure, I authorize the administration of contrast media per Radiology protocol    Answer:   Yes    Order Specific Question:   What is the patient's sedation requirement?    Answer:   No Sedation    Order Specific Question:   Does the patient have a pacemaker or implanted devices?    Answer:   No    Order Specific Question:   Radiology Contrast Protocol - do NOT remove file path    Answer:   [email or web address]    Order Specific Question:   Preferred imaging location?    Answer:   [facility] (table limit - 550 lbs)  . DG Bone Density    Standing Status:   Future    Standing Expiration Date:   06/19/2021    Order Specific Question:   Reason for Exam (SYMPTOM  OR DIAGNOSIS REQUIRED)    Answer:   osteopenia    Order Specific Question:   Is the patient pregnant?    Answer:   No    Order Specific Question:   Preferred imaging location?    Answer:   [facility]  . Amb Ref to Medical Weight Management    Referral Priority:   Routine    Referral Type:   Consultation    Number of Visits Requested:   1    All questions were answered. The patient knows to call the clinic with any problems, questions or concerns. The total time spent in the appointment was 30 minutes encounter with patients including review of chart and various tests results, discussions about plan of care and coordination of care plan   Artis Delay, MD 06/20/2020 6:45 AM  INTERVAL HISTORY: Please see below for problem oriented charting. She returns for further follow-up of history of ovarian cancer, in the setting of positive genetic testing with high risk for pancreatic cancer and breast cancer She is taking Arimidex as prevention She tolerated Arimidex well No recent mood swings or depression Denies significant hot flashes She has not gotten baseline bone density scan She is taking calcium with vitamin D  She has not seen GI service since 2019 She denies any recent abnormal breast examination, palpable mass, abnormal breast appearance or nipple changes She denies abdominal pain, nausea or changes in bowel habits SUMMARY OF ONCOLOGIC HISTORY: Oncology History Overview Note  Genetic testing came back positive for ATM variant   Left ovarian epithelial cancer ([place])  08/19/2017 Imaging   She had outside CT scan which showed large abdominal mass   08/27/2017 Tumor Marker   Patient's tumor was tested for the following markers: CA-125 Results of the tumor marker test revealed 904.5   09/11/2017  Pathology Results   1. 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 Soft tissue, biopsy, right medial thigh MATURE LIPOMA 3. Ovary and fallopian tube, right ENDOMETRIOMA AND SIMPLE SEROUS CYST WITH FALLOPIAN TUBE ADHESION NEGATIVE FOR MALIGNANCY 4. Omentum, resection for tumor BENIGN OMENTUM NEGATIVE FOR CARCINOMA 5. Lymph nodes, regional resection, right pelvic EIGHT BENIGN LYMPH NODES (0/8) 6. Lymph nodes, regional resection, left pelvic SEVEN BENIGN LYMPH NODES (0/7) 7. Lymph node, biopsy, right para-aortic FOUR BENIGN LYMPH NODES (0/4) 8. Lymph node, biopsy, left para-aortic ONE BENIGN LYMPH NODE (0/1) 9. Peritoneum, biopsy, left abdominal BENIGN FIBROMUSCULAR TISSUE 10. Peritoneum, biopsy, right abdominal BENIGN FIBROMUSCULAR TISSUE WITH SEROSITIS 11. Peritoneum, biopsy, pelvic FIBROADIPOSE TISSUE WITH SEROSITIS  Specimen(s): Ovary and fallopian tube Procedure: (including lymph node sampling): salpingo-oophorectomy Primary tumor site (including laterality): Left ovary Ovarian surface involvement: Yes Ovarian capsule intact without fragmentation: intact Maximum tumor size (cm): 20.0 cm Histologic type: Endometrioid carcinoma Grade: 1 Peritoneal implants: (specify invasive or non-invasive): Negative Pelvic extension (list additional structures on separate lines and if involved): Negative Lymph nodes: number examined 20 ; number positive 0 TNM code: pT1c2, pN0, pMx FIGO Stage (based on pathologic findings, needs clinical correlation): IC2   09/11/2017 Surgery   Preoperative Diagnosis: 1. left adnexal mass.  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   Procedure(s) Performed: 1. Exploratory laparotomy with bilateral salpingo-oophorectomy, pelvic and  para-aortic lymph node dissection, omentectomy and radical debulking for ovarian cancer (CPT [phone])  Surgeon: Everitt Amber, M.D. Operative Findings:20 cm left ovarian mass.   No intraperitoneal rupture occurred;  Frozen pathology was consistent with adenocarcinoma; right tube and ovary normal in appearance; 3) normal bilateral pelvic and para-aortic lymph nodes and omentum; small and large bowel to palpation. This represented an optimal cytoreduction with no gross visible disease remaining.    10/09/2017 Tumor Marker   Patient's tumor was tested for the following markers: CA-125 Results of the tumor marker test revealed 68.7   10/15/2017 Adverse Reaction   She developed reaction to Paclitaxel.   10/15/2017 - 01/29/2018 Chemotherapy   She received carboplatin. Due to infusion reaction to Taxol with cycle 1, treatment was switched to carboplatin and Abraxane   11/03/2017 Procedure   Successful placement of a right internal jugular approach power injectable Port-A-Cath. The catheter is ready for immediate use.   11/27/2017 Tumor Marker   Patient's tumor was tested for the following markers: CA-125 Results of the tumor marker test revealed 27.7   12/12/2017 Genetic Testing   The patient had genetic testing due to a personal history of ovarian cancer and a family history of pancreatic cancer (and possibly breast cancer).  The [facility] Hereditary Cancer Panel + tumor HRD analysis was ordered from the laboratory [facility]. The [facility] gene panel  offered by [employer] includes sequencing and deletion/duplication testing of the following 29 genes: APC, ATM, BARD1, BMPR1A, BRCA1, BRCA2, BRIP1, CHD1, CDK4, CDKN2A, CHEK2, EPCAM (large rearrangement only), HOXB13, MLH1, MSH2, MSH6, MUTYH, NBN, PALB2, PMS2, PTEN, RAD51C, RAD51D, SMAD4, STK11, and TP53. Sequencing was performed for select regions of POLE and POLD1, and large rearrangement analysis was performed for select regions of GREM1.  Results:  POSITIVE for a heterozygous pathogenic variant in ATM c. [id number] (p.[id number]).  The date of this test report is 12/12/2017.   01/08/2018 Tumor Marker   Patient's tumor was tested for the following markers: CA-125 Results of the tumor marker test revealed 17.5   03/02/2018 Tumor Marker   Patient's tumor was tested for the following markers: CA-125 Results of the tumor marker test revealed 11.4   03/14/2018 Imaging   MRCP 1. No worrisome pancreatic lesions are currently present. 2. Cholelithiasis. 3. Small benign-appearing bilateral renal cysts. 4. Midline anterior abdominal wall laxity seen on coronal images, potential small hernia in this vicinity without observed complication   05/16/3418 Procedure   Successful right IJ vein Port-A-Cath explant.   12/21/2019 Tumor Marker   Patient's tumor was tested for the following markers: CA-125 Results of the tumor marker test revealed 9.5.     REVIEW OF SYSTEMS:   Constitutional: Denies fevers, chills or abnormal weight loss Eyes: Denies blurriness of vision Ears, nose, mouth, throat, and face: Denies mucositis or sore throat Respiratory: Denies cough, dyspnea or wheezes Cardiovascular: Denies palpitation, chest discomfort or lower extremity swelling Gastrointestinal:  Denies nausea, heartburn or change in bowel habits Skin: Denies abnormal skin rashes Lymphatics: Denies new lymphadenopathy or easy bruising Neurological:Denies numbness, tingling or new weaknesses Behavioral/Psych: Mood is stable, no new changes  All other systems were reviewed with the patient and are negative.  I have reviewed the past medical history, past surgical history, social history and family history with the patient and they are unchanged from previous note.  ALLERGIES:  is allergic to bee venom, chlorhexidine, paclitaxel, bacitracin, iodine, and other.  MEDICATIONS:  Current Outpatient Medications  Medication Sig Dispense Refill  . Cinnamon 500 MG  TABS Take 2,000 mg by mouth 2 (two) times daily.    . Turmeric 500 MG TABS Take 500 mg by mouth daily.    . AMBULATORY NON FORMULARY MEDICATION Medication Name: Turmeric with ginger 500 mg tablet-1 tablet by mouth daily    . amLODipine (NORVASC) 5 MG tablet Take 1 tablet (5 mg total) by mouth every evening. 90 tablet 3  . anastrozole (ARIMIDEX) 1 MG tablet TAKE 1 TABLET BY MOUTH EVERY DAY 90 tablet 8  . Apple Cider Vinegar 600 MG CAPS Take 600 mg by mouth 2 (two) times daily. Takes in morning and in evening.    . Cholecalciferol (VITAMIN D3) 25 MCG TABS Take 1,000 Units by mouth daily.     Marland Kitchen ibuprofen (ADVIL,MOTRIN) 200 MG tablet Take 400 mg by mouth every 6 (six) hours as needed.    . Inulin (FIBER CHOICE PO) Take 10 g by mouth daily.    . Liniments (SALONPAS PAIN RELIEF PATCH EX) Place 1 patch onto the skin daily as needed (knee pain).    Marland Kitchen loratadine (CLARITIN) 10 MG tablet Take 10 mg by mouth daily.    . Magnesium Gluconate 550 MG TABS Take 500 mg by mouth daily.     . Melatonin 5 MG TABS Take 5 mg by mouth daily.     . Misc Natural Products (GLUCOSAMINE CHOND  MSM FORMULA) TABS Take 2 tablets by mouth daily.    . Multiple Vitamins-Minerals (HAIR SKIN & NAILS ADVANCED PO) Take 1 tablet by mouth daily.    Marland Kitchen ZINC-VITAMIN C PO Take 1,000 mg by mouth daily.     Current Facility-Administered Medications  Medication Dose Route Frequency Provider Last Rate Last Admin  . 0.9 %  sodium chloride infusion  500 mL Intravenous Once Milus Banister, MD        PHYSICAL EXAMINATION: ECOG PERFORMANCE STATUS: 1 - Symptomatic but completely ambulatory  Vitals:   06/19/20 0949  BP: (!) 120/98  Pulse: 81  Resp: 18  Temp: 98.7 F (37.1 C)  SpO2: 97%   Filed Weights   06/19/20 0949  Weight: 215 lb 12.8 oz (97.9 kg)    GENERAL:alert, no distress and comfortable SKIN: skin color, texture, turgor are normal, no rashes or significant lesions EYES: normal, Conjunctiva are pink and non-injected,  sclera clear OROPHARYNX:no exudate, no erythema and lips, buccal mucosa, and tongue normal  NECK: supple, thyroid normal size, non-tender, without nodularity LYMPH:  no palpable lymphadenopathy in the cervical, axillary or inguinal LUNGS: clear to auscultation and percussion with normal breathing effort HEART: regular rate & rhythm and no murmurs and no lower extremity edema ABDOMEN:abdomen soft, non-tender and normal bowel sounds Musculoskeletal:no cyanosis of digits and no clubbing  NEURO: alert & oriented x 3 with fluent speech, no focal motor/sensory deficits  LABORATORY DATA:  I have reviewed the data as listed    Component Value Date/Time   NA 142 06/19/2020 0933   NA 141 11/27/2017 0818   K 4.5 06/19/2020 0933   K 3.8 11/27/2017 0818   CL 105 06/19/2020 0933   CO2 25 06/19/2020 0933   CO2 25 11/27/2017 0818   GLUCOSE 111 (H) 06/19/2020 0933   GLUCOSE 90 11/27/2017 0818   BUN 12 06/19/2020 0933   BUN 12.9 11/27/2017 0818   CREATININE 1.01 (H) 06/19/2020 0933   CREATININE 0.8 11/27/2017 0818   CALCIUM 10.5 (H) 06/19/2020 0933   CALCIUM 9.9 11/27/2017 0818   PROT 7.7 06/19/2020 0933   PROT 7.0 11/27/2017 0818   ALBUMIN 4.3 06/19/2020 0933   ALBUMIN 3.8 11/27/2017 0818   AST 22 06/19/2020 0933   AST 11 11/27/2017 0818   ALT 25 06/19/2020 0933   ALT 11 11/27/2017 0818   ALKPHOS 76 06/19/2020 0933   ALKPHOS 68 11/27/2017 0818   BILITOT 0.8 06/19/2020 0933   BILITOT 0.41 11/27/2017 0818   GFRNONAA >60 06/19/2020 0933   GFRAA >60 06/19/2020 0933    No results found for: SPEP, UPEP  Lab Results  Component Value Date   WBC 6.6 06/19/2020   NEUTROABS 4.0 06/19/2020   HGB 13.4 06/19/2020   HCT 41.7 06/19/2020   MCV 87.6 06/19/2020   PLT 357 06/19/2020      Chemistry      Component Value Date/Time   NA 142 06/19/2020 0933   NA 141 11/27/2017 0818   K 4.5 06/19/2020 0933   K 3.8 11/27/2017 0818   CL 105 06/19/2020 0933   CO2 25 06/19/2020 0933   CO2 25  11/27/2017 0818   BUN 12 06/19/2020 0933   BUN 12.9 11/27/2017 0818   CREATININE 1.01 (H) 06/19/2020 0933   CREATININE 0.8 11/27/2017 0818      Component Value Date/Time   CALCIUM 10.5 (H) 06/19/2020 0933   CALCIUM 9.9 11/27/2017 0818   ALKPHOS 76 06/19/2020 0933   ALKPHOS 68 11/27/2017 0818  AST 22 06/19/2020 0933   AST 11 11/27/2017 0818   ALT 25 06/19/2020 0933   ALT 11 11/27/2017 0818   BILITOT 0.8 06/19/2020 0933   BILITOT 0.41 11/27/2017 0818

## 2020-06-20 NOTE — Telephone Encounter (Signed)
VM message left informing Pt. Per Dr. Alvy Bimler CA-125 is Ok. Informed Pt. Per message if she has any problems or concerns to return call to Dr Alvy Bimler office.

## 2020-06-20 NOTE — Telephone Encounter (Signed)
-----   Message from Heath Lark, MD sent at 06/19/2020 10:11 AM EDT ----- Regarding: pls help schedule bone density scan for osteopenia, on Arimidex

## 2020-06-20 NOTE — Assessment & Plan Note (Signed)
We discussed the importance of weight loss and the risk of obesity dealing with breast cancer and ovarian cancer She is interested to be referred to medical weight management center

## 2020-07-11 ENCOUNTER — Ambulatory Visit (INDEPENDENT_AMBULATORY_CARE_PROVIDER_SITE_OTHER): Payer: 59 | Admitting: Family Medicine

## 2020-07-11 ENCOUNTER — Encounter (INDEPENDENT_AMBULATORY_CARE_PROVIDER_SITE_OTHER): Payer: Self-pay | Admitting: Family Medicine

## 2020-07-11 ENCOUNTER — Other Ambulatory Visit: Payer: Self-pay

## 2020-07-11 VITALS — BP 121/74 | HR 78 | Temp 98.4°F | Ht 64.0 in | Wt 212.0 lb

## 2020-07-11 DIAGNOSIS — R0602 Shortness of breath: Secondary | ICD-10-CM | POA: Diagnosis not present

## 2020-07-11 DIAGNOSIS — R5383 Other fatigue: Secondary | ICD-10-CM

## 2020-07-11 DIAGNOSIS — F3289 Other specified depressive episodes: Secondary | ICD-10-CM

## 2020-07-11 DIAGNOSIS — E559 Vitamin D deficiency, unspecified: Secondary | ICD-10-CM | POA: Diagnosis not present

## 2020-07-11 DIAGNOSIS — Z0289 Encounter for other administrative examinations: Secondary | ICD-10-CM

## 2020-07-11 DIAGNOSIS — D61818 Other pancytopenia: Secondary | ICD-10-CM

## 2020-07-11 DIAGNOSIS — Z8543 Personal history of malignant neoplasm of ovary: Secondary | ICD-10-CM

## 2020-07-11 DIAGNOSIS — Z9189 Other specified personal risk factors, not elsewhere classified: Secondary | ICD-10-CM

## 2020-07-11 DIAGNOSIS — G4733 Obstructive sleep apnea (adult) (pediatric): Secondary | ICD-10-CM

## 2020-07-11 DIAGNOSIS — I10 Essential (primary) hypertension: Secondary | ICD-10-CM

## 2020-07-11 DIAGNOSIS — Z6836 Body mass index (BMI) 36.0-36.9, adult: Secondary | ICD-10-CM

## 2020-07-11 NOTE — Progress Notes (Signed)
Dear Dr. Alvy Bimler,   Thank you for referring Sierra Gray to our clinic. The following note includes my evaluation and treatment recommendations.  Chief Complaint:   OBESITY Sierra Gray (MR# [id number]) is a 59 y.o. female who presents for evaluation and treatment of obesity and related comorbidities. Current BMI is Body mass index is 36.39 kg/m. Sierra Gray has been struggling with her weight for many years and has been unsuccessful in either losing weight, maintaining weight loss, or reaching her healthy weight goal.  Sierra Gray is currently in the action stage of change and ready to dedicate time achieving and maintaining a healthier weight. Sierra Gray is interested in becoming our patient and working on intensive lifestyle modifications including (but not limited to) diet and exercise for weight loss.  Sierra Gray is single and lives alone.  She is currently out of work and looking for employment.  She used Weight Watchers and the [employer] Protein diet in the past.  She says the higher protein worked the best.  She craves sweet and salty foods.  She says she likes to snack on candy, chips, and ice cream.  She skips breakfast most days of the week.  She drinks smoothies and other caloric beverages.  She will be going to [facility] for 1 weekend next week. She drinks 2 bottles of water per day.  Sierra Gray's habits were reviewed today and are as follows: her desired weight loss is 80 pounds, she has been heavy most of her life, she started gaining weight after college, her heaviest weight ever was 218 pounds, she craves sweet and salty foods, she snacks frequently in the evenings, she skips breakfast frequently, she is frequently drinking liquids with calories, she frequently eats larger portions than normal and she struggles with emotional eating.  Depression Screen [name]'s Food and Mood (modified PHQ-9) score was 11.  Depression screen PHQ 2/9 07/11/2020  Decreased Interest 1  Down, Depressed,  Hopeless 2  PHQ - 2 Score 3  Altered sleeping 1  Tired, decreased energy 2  Change in appetite 2  Feeling bad or failure about yourself  3  Trouble concentrating 0  Moving slowly or fidgety/restless 0  Suicidal thoughts 0  PHQ-9 Score 11  Difficult doing work/chores Not difficult at all   Subjective:   1. Other fatigue Sierra Gray denies daytime somnolence and admits to waking up still tired. Patent has a history of symptoms of morning fatigue and snoring. Sierra Gray generally gets 9 hours of sleep per night, and states that she has poor quality sleep. Snoring is present. Apneic episodes are present. Epworth Sleepiness Score is 3.  2. SOB (shortness of breath) on exertion Sierra Gray notes increasing shortness of breath with exercising and seems to be worsening over time with weight gain. She notes getting out of breath sooner with activity than she used to. This has gotten worse recently. Sierra Gray denies shortness of breath at rest or orthopnea.  3. Essential hypertension Review: taking medications as instructed, no medication side effects noted, no chest pain on exertion, no dyspnea on exertion, no swelling of ankles.  Sierra Gray has been taking Norvasc for many years (8-10).  Blood pressure is at goal today.  BP Readings from Last 3 Encounters:  07/11/20 121/74  06/19/20 (!) 120/98  03/31/20 135/77   4. History of ovarian cancer She was diagnosed in October 2018.  Stage I-II.  She underwent 6 rounds of chemotherapy.  She is now seen by Dr. Alvy Bimler in Oncology every 3 months,  or her surgeon, Dr. Everitt Amber.  Positive monoallelic mutation of ATN gene.  5. Vitamin D deficiency She is currently taking OTC vitamin D 1000 IU daily each day. She endorses fatigue and being achy.  6. OSA (obstructive sleep apnea) [name] has a diagnosis of sleep apnea. She reports that she is using a CPAP nightly.  She has seen Dr. Norma Fredrickson in [place], [place] for this.  Last seen 1-2 years ago.  Symptoms are stable.  7.  Pancytopenia, acquired [facility]) This is secondary to chemotherapy.  8. Other depression with emotional eating  Also positive for anxiety.  She has no history of medications or psychologist.  Sierra Gray is struggling with emotional eating and using food for comfort to the extent that it is negatively impacting her health. She has been working on behavior modification techniques to help reduce her emotional eating and has been unsuccessful. She shows no sign of suicidal or homicidal ideations.  9. At risk for dehydration Sierra Gray is at risk for dehydration due to inadequate water intake.  Assessment/Plan:   1. Other fatigue Sierra Gray does feel that her weight is causing her energy to be lower than it should be. Fatigue may be related to obesity, depression or many other causes. Labs will be ordered, and in the meanwhile, Sierra Gray will focus on self care including making healthy food choices, increasing physical activity and focusing on stress reduction. - EKG 12-Lead - Hemoglobin A1c - Insulin, random - Lipid Panel With LDL/HDL Ratio - Vitamin B12 - Folate - T3 - T4, free - TSH  2. SOB (shortness of breath) on exertion Sierra Gray does feel that she gets out of breath more easily that she used to when she exercises. Sierra Gray's shortness of breath appears to be obesity related and exercise induced. She has agreed to work on weight loss and gradually increase exercise to treat her exercise induced shortness of breath. Will continue to monitor closely.  3. Essential hypertension Sierra Gray is working on healthy weight loss and exercise to improve blood pressure control. We will watch for signs of hypotension as she continues her lifestyle modifications.  4. History of ovarian cancer Followed by Oncology for this problem. Those encounter notes were reviewed.  5. Vitamin D deficiency Low Vitamin D level contributes to fatigue and are associated with obesity, breast, and colon cancer.  - VITAMIN D 25 Hydroxy (Vit-D  Deficiency, Fractures)  6. OSA (obstructive sleep apnea) Intensive lifestyle modifications are the first line treatment for this issue. We discussed several lifestyle modifications today and she will continue to work on diet, exercise and weight loss efforts. We will continue to monitor. Orders and follow up as documented in patient record.   Counseling  Sleep apnea is a condition in which breathing pauses or becomes shallow during sleep. This happens over and over during the night. This disrupts your sleep and keeps your body from getting the rest that it needs, which can cause tiredness and lack of energy (fatigue) during the day.  Sleep apnea treatment: If you were given a device to open your airway while you sleep, USE IT!  Sleep hygiene:   Limit or avoid alcohol, caffeinated beverages, and cigarettes, especially close to bedtime.   Do not eat a large meal or eat spicy foods right before bedtime. This can lead to digestive discomfort that can make it hard for you to sleep.  Keep a sleep diary to help you and your health care provider figure out what could be causing your insomnia.  Make your bedroom a dark, comfortable place where it is easy to fall asleep. ? Put up shades or blackout curtains to block light from outside. ? Use a white noise machine to block noise. ? Keep the temperature cool.  Limit screen use before bedtime. This includes: ? Watching TV. ? Using your smartphone, tablet, or computer.  Stick to a routine that includes going to bed and waking up at the same times every day and night. This can help you fall asleep faster. Consider making a quiet activity, such as reading, part of your nighttime routine.  Try to avoid taking naps during the day so that you sleep better at night.  Get out of bed if you are still awake after 15 minutes of trying to sleep. Keep the lights down, but try reading or doing a quiet activity. When you feel sleepy, go back to bed.  7.  Pancytopenia, acquired ([place]) Will check labs today.  8. Other depression with emotional eating  Patient was referred to Dr. Mallie Mussel, our Bariatric Psychologist, for evaluation due to her elevated PHQ-9 score and significant struggles with emotional eating.  9. At risk for dehydration Jai was given approximately 15 minutes dehydration prevention counseling today. Laurena is at risk for dehydration due to weight loss and current medication(s). She was encouraged to hydrate and monitor fluid status to avoid dehydration as well as weight loss plateaus.   10. Class 2 severe obesity with serious comorbidity and body mass index (BMI) of 36.0 to 36.9 in adult, unspecified obesity type [facility]) Marnell is currently in the action stage of change and her goal is to continue with weight loss efforts. I recommend Alexia begin the structured treatment plan as follows:  She has agreed to the Category 2 Plan.  Exercise goals: As is.   Behavioral modification strategies: increasing lean protein intake, decreasing simple carbohydrates, increasing water intake, decreasing liquid calories, meal planning and cooking strategies, keeping healthy foods in the home and planning for success.  She was informed of the importance of frequent follow-up visits to maximize her success with intensive lifestyle modifications for her multiple health conditions. She was informed we would discuss her lab results at her next visit unless there is a critical issue that needs to be addressed sooner. Rosanna agreed to keep her next visit at the agreed upon time to discuss these results.  Objective:   Blood pressure 121/74, pulse 78, temperature 98.4 F (36.9 C), height 5\' 4"  (1.626 m), weight 212 lb (96.2 kg), SpO2 98 %. Body mass index is 36.39 kg/m.  EKG: Normal sinus rhythm, rate 75 bpm.  Indirect Calorimeter completed today shows a VO2 of 311 and a REE of 2165.  Her calculated basal metabolic rate is 0960 thus her basal metabolic  rate is better than expected.  General: Cooperative, alert, well developed, in no acute distress. HEENT: Conjunctivae and lids unremarkable. Cardiovascular: Regular rhythm.  Lungs: Normal work of breathing. Neurologic: No focal deficits.   Lab Results  Component Value Date   CREATININE 1.01 (H) 06/19/2020   BUN 12 06/19/2020   NA 142 06/19/2020   K 4.5 06/19/2020   CL 105 06/19/2020   CO2 25 06/19/2020   Lab Results  Component Value Date   ALT 25 06/19/2020   AST 22 06/19/2020   ALKPHOS 76 06/19/2020   BILITOT 0.8 06/19/2020   Lab Results  Component Value Date   HGBA1C 5.6 07/11/2020   HGBA1C 5.5 09/04/2018   Lab Results  Component Value  Date   WBC 6.6 06/19/2020   HGB 13.4 06/19/2020   HCT 41.7 06/19/2020   MCV 87.6 06/19/2020   PLT 357 06/19/2020   Attestation Statements:   Reviewed by clinician on day of visit: allergies, medications, problem list, medical history, surgical history, family history, social history, and previous encounter notes.  I, Water quality scientist, CMA, am acting as Location manager for [employer], DO.  I have reviewed the above documentation for accuracy and completeness, and I agree with the above. Mellody Dance, DO

## 2020-07-12 LAB — LIPID PANEL WITH LDL/HDL RATIO
Cholesterol, Total: 263 mg/dL — ABNORMAL HIGH (ref 100–199)
HDL: 45 mg/dL (ref >39)
LDL Chol Calc (NIH): 170 mg/dL — ABNORMAL HIGH (ref 0–99)
LDL/HDL Ratio: 3.8 ratio — ABNORMAL HIGH (ref 0.0–3.2)
Triglycerides: 255 mg/dL — ABNORMAL HIGH (ref 0–149)
VLDL Cholesterol Cal: 48 mg/dL — ABNORMAL HIGH (ref 5–40)

## 2020-07-12 LAB — FOLATE: Folate: >20 ng/mL (ref >3.0)

## 2020-07-12 LAB — HEMOGLOBIN A1C
Est. average glucose Bld gHb Est-mCnc: 114 mg/dL
Hgb A1c MFr Bld: 5.6 % (ref 4.8–5.6)

## 2020-07-12 LAB — T3: T3, Total: 131 ng/dL (ref 71–180)

## 2020-07-12 LAB — T4, FREE: Free T4: 1.26 ng/dL (ref 0.82–1.77)

## 2020-07-12 LAB — VITAMIN B12: Vitamin B-12: 375 pg/mL (ref 232–1245)

## 2020-07-12 LAB — INSULIN, RANDOM: INSULIN: 25.7 u[IU]/mL — ABNORMAL HIGH (ref 2.6–24.9)

## 2020-07-12 LAB — VITAMIN D 25 HYDROXY (VIT D DEFICIENCY, FRACTURES): Vit D, 25-Hydroxy: 40.8 ng/mL (ref 30.0–100.0)

## 2020-07-12 LAB — TSH: TSH: 0.285 u[IU]/mL — ABNORMAL LOW (ref 0.450–4.500)

## 2020-07-17 ENCOUNTER — Other Ambulatory Visit: Payer: Self-pay | Admitting: Hematology and Oncology

## 2020-07-17 ENCOUNTER — Encounter (INDEPENDENT_AMBULATORY_CARE_PROVIDER_SITE_OTHER): Payer: Self-pay | Admitting: Family Medicine

## 2020-07-17 DIAGNOSIS — E785 Hyperlipidemia, unspecified: Secondary | ICD-10-CM | POA: Insufficient documentation

## 2020-07-17 DIAGNOSIS — Z1501 Genetic susceptibility to malignant neoplasm of breast: Secondary | ICD-10-CM

## 2020-07-17 DIAGNOSIS — Z1589 Genetic susceptibility to other disease: Secondary | ICD-10-CM
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
## 2020-07-24 ENCOUNTER — Ambulatory Visit (HOSPITAL_COMMUNITY): Payer: 59

## 2020-07-24 ENCOUNTER — Ambulatory Visit (HOSPITAL_COMMUNITY)
Admission: RE | Admit: 2020-07-24 | Discharge: 2020-07-24 | Disposition: A | Discharge status: Home / Self Care | Payer: 59 | Source: Ambulatory Visit | Attending: Hematology and Oncology | Admitting: Hematology and Oncology

## 2020-07-24 ENCOUNTER — Other Ambulatory Visit: Payer: Self-pay

## 2020-07-24 DIAGNOSIS — Z1509 Genetic susceptibility to other malignant neoplasm: Secondary | ICD-10-CM | POA: Diagnosis present

## 2020-07-24 DIAGNOSIS — Z1501 Genetic susceptibility to malignant neoplasm of breast: Secondary | ICD-10-CM | POA: Diagnosis present

## 2020-07-24 DIAGNOSIS — Z1589 Genetic susceptibility to other disease: Secondary | ICD-10-CM | POA: Insufficient documentation

## 2020-07-24 MED ORDER — GADOBUTROL 1 MMOL/ML IV SOLN
10.0000 mL | Freq: Once | INTRAVENOUS | Status: AC | PRN
  Administered 2020-07-24: 10 mL via INTRAVENOUS

## 2020-07-25 ENCOUNTER — Encounter (INDEPENDENT_AMBULATORY_CARE_PROVIDER_SITE_OTHER): Payer: Self-pay | Admitting: Family Medicine

## 2020-07-25 ENCOUNTER — Ambulatory Visit (INDEPENDENT_AMBULATORY_CARE_PROVIDER_SITE_OTHER): Payer: 59 | Admitting: Family Medicine

## 2020-07-25 ENCOUNTER — Telehealth: Payer: Self-pay

## 2020-07-25 VITALS — BP 130/85 | HR 80 | Temp 98.0°F | Ht 64.0 in | Wt 207.0 lb

## 2020-07-25 DIAGNOSIS — Z6835 Body mass index (BMI) 35.0-35.9, adult: Secondary | ICD-10-CM

## 2020-07-25 DIAGNOSIS — I10 Essential (primary) hypertension: Secondary | ICD-10-CM

## 2020-07-25 DIAGNOSIS — D61818 Other pancytopenia: Secondary | ICD-10-CM | POA: Diagnosis not present

## 2020-07-25 DIAGNOSIS — E559 Vitamin D deficiency, unspecified: Secondary | ICD-10-CM | POA: Diagnosis not present

## 2020-07-25 DIAGNOSIS — E7849 Other hyperlipidemia: Secondary | ICD-10-CM | POA: Diagnosis not present

## 2020-07-25 DIAGNOSIS — Z9189 Other specified personal risk factors, not elsewhere classified: Secondary | ICD-10-CM

## 2020-07-25 DIAGNOSIS — E8881 Metabolic syndrome: Secondary | ICD-10-CM

## 2020-07-25 NOTE — Telephone Encounter (Signed)
-----   Message from Flo Shanks, RN sent at 07/25/2020 10:24 AM EDT ----- Regarding: FW: call her: breast MRI is normal. recommend screening mammogram in Feb 2022  ----- Message ----- From: Heath Lark, MD Sent: 07/25/2020  10:19 AM EDT To: Flo Shanks, RN Subject: call her: breast MRI is normal. recommend [place]

## 2020-07-25 NOTE — Progress Notes (Signed)
Chief Complaint:   OBESITY Sierra Gray is here to discuss her progress with her obesity treatment plan along with follow-up of her obesity related diagnoses. Sierra Gray is on the Category 2 Plan and states she is following her eating plan approximately 50% of the time. Sierra Gray states she is walking for 60 minutes 7 times per week.  Today's visit was #: 2 Starting weight: 212 lbs Starting date: 07/11/2020 Today's weight: 207 lbs Today's date: 07/25/2020 Total lbs lost to date: 5 lbs Total lbs lost since last in-office visit: 5 lbs  Interim History: Sierra Gray was on vacation for 1 week.  After 2 weeks, she says, "It is too much food.  I skip lunch much of the time.  I can't eat that many pieces of bread."  She has not been weighing her foods.  She ate fried foods, pizza, etc., at the beach.  She says she is drinking more water.  Subjective:   1. Essential hypertension Review: taking medications as instructed, no medication side effects noted, no chest pain on exertion, no dyspnea on exertion, no swelling of ankles.    BP Readings from Last 3 Encounters:  07/25/20 130/85  07/11/20 121/74  06/19/20 (!) 120/98   2. Vitamin D deficiency [name]'s Vitamin D level was 40.8 on 07/11/2020. She is currently taking OTC vitamin D 2,500 IU each day. She denies nausea, vomiting or muscle weakness.  3. Pancytopenia, acquired ([place]) Sierra Gray is followed by Oncology for this.  4. Other hyperlipidemia Sierra Gray has hyperlipidemia and has been trying to improve her cholesterol levels with intensive lifestyle modification including a low saturated fat diet, exercise and weight loss. She denies any chest pain, claudication or myalgias.  She is not on any medication for this.  Her ASCVD risk is 5.5%.  She has never been on medication or told she needs it.  Lab Results  Component Value Date   ALT 25 06/19/2020   AST 22 06/19/2020   ALKPHOS 76 06/19/2020   BILITOT 0.8 06/19/2020   Lab Results  Component Value Date   CHOL  263 (H) 07/11/2020   HDL 45 07/11/2020   LDLCALC 170 (H) 07/11/2020   TRIG 255 (H) 07/11/2020   5. Insulin resistance Sierra Gray has a diagnosis of insulin resistance based on her elevated fasting insulin level >5. She continues to work on diet and exercise to decrease her risk of diabetes.  Lab Results  Component Value Date   INSULIN 25.7 (H) 07/11/2020   Lab Results  Component Value Date   HGBA1C 5.6 07/11/2020   6. At risk for osteoporosis Sierra Gray is at higher risk of osteopenia and osteoporosis due to Vitamin D deficiency.   Assessment/Plan:   1. Essential hypertension Sierra Gray is working on healthy weight loss and exercise to improve blood pressure control. We will watch for signs of hypotension as she continues her lifestyle modifications.  Blood pressure is stable and at goal.  2. Vitamin D deficiency Double OTC dose of vitamin D to 5,000 IU daily.  Recheck level in 3 months.  3. Pancytopenia, acquired ([place]) Labs are stable from 1 month ago.  4. Other hyperlipidemia Cardiovascular risk and specific lipid/LDL goals reviewed.  We discussed several lifestyle modifications today and Sierra Gray will continue to work on diet, exercise and weight loss efforts. Orders and follow up as documented in patient record.  Continue prudent nutritional plan, weight loss.  Consider speaking with PCP regarding medication management.  Counseling Intensive lifestyle modifications are the first line treatment for  this issue. . Dietary changes: Increase soluble fiber. Decrease simple carbohydrates. . Exercise changes: Moderate to vigorous-intensity aerobic activity 150 minutes per week if tolerated. . Lipid-lowering medications: see documented in medical record.  5. Insulin resistance Sierra Gray will continue to work on weight loss, exercise, and decreasing simple carbohydrates to help decrease the risk of diabetes. Sierra Gray agreed to follow-up with [name] as directed to closely monitor her progress.  6. At risk for  osteoporosis Sierra Gray was given approximately 15 minutes of osteoporosis prevention counseling today. Sierra Gray is at risk for osteopenia and osteoporosis due to her Vitamin D deficiency. She was encouraged to take her Vitamin D and follow her higher calcium diet and increase strengthening exercise to help strengthen her bones and decrease her risk of osteopenia and osteoporosis.  Repetitive spaced learning was employed today to elicit superior memory formation and behavioral change.  7. Class 2 severe obesity with serious comorbidity and body mass index (BMI) of 35.0 to 35.9 in adult, unspecified obesity type [facility]) Sierra Gray is currently in the action stage of change. As such, her goal is to continue with weight loss efforts. She has agreed to the Category 2 Plan.   Exercise goals: As is.  Behavioral modification strategies: increasing lean protein intake, decreasing simple carbohydrates, increasing water intake, decreasing liquid calories, no skipping meals, meal planning and cooking strategies and planning for success.  Sierra Gray has agreed to follow-up with our clinic in 2 weeks. She was informed of the importance of frequent follow-up visits to maximize her success with intensive lifestyle modifications for her multiple health conditions.   Objective:   Blood pressure 130/85, pulse 80, temperature 98 F (36.7 C), height 5\' 4"  (1.626 m), weight 207 lb (93.9 kg), SpO2 96 %. Body mass index is 35.53 kg/m.  General: Cooperative, alert, well developed, in no acute distress. HEENT: Conjunctivae and lids unremarkable. Cardiovascular: Regular rhythm.  Lungs: Normal work of breathing. Neurologic: No focal deficits.   Lab Results  Component Value Date   CREATININE 1.01 (H) 06/19/2020   BUN 12 06/19/2020   NA 142 06/19/2020   K 4.5 06/19/2020   CL 105 06/19/2020   CO2 25 06/19/2020   Lab Results  Component Value Date   ALT 25 06/19/2020   AST 22 06/19/2020   ALKPHOS 76 06/19/2020   BILITOT 0.8  06/19/2020   Lab Results  Component Value Date   HGBA1C 5.6 07/11/2020   HGBA1C 5.5 09/04/2018   Lab Results  Component Value Date   INSULIN 25.7 (H) 07/11/2020   Lab Results  Component Value Date   TSH 0.285 (L) 07/11/2020   Lab Results  Component Value Date   CHOL 263 (H) 07/11/2020   HDL 45 07/11/2020   LDLCALC 170 (H) 07/11/2020   TRIG 255 (H) 07/11/2020   Lab Results  Component Value Date   WBC 6.6 06/19/2020   HGB 13.4 06/19/2020   HCT 41.7 06/19/2020   MCV 87.6 06/19/2020   PLT 357 06/19/2020   Attestation Statements:   Reviewed by clinician on day of visit: allergies, medications, problem list, medical history, surgical history, family history, social history, and previous encounter notes.  I, Water quality scientist, CMA, am acting as Location manager for [employer], DO.  I have reviewed the above documentation for accuracy and completeness, and I agree with the above. Mellody Dance, DO

## 2020-07-25 NOTE — Patient Instructions (Signed)
The 10-year ASCVD risk score [name] DC Brooke Bonito., et al., 2013) is: 5.5%   Values used to calculate the score:     Age: 59 years     Sex: Female     Is Non-Hispanic African American: No     Diabetic: No     Tobacco smoker: No     Systolic Blood Pressure: 856 mmHg     Is BP treated: Yes     HDL Cholesterol: 45 mg/dL     Total Cholesterol: 263 mg/dL

## 2020-07-25 NOTE — Telephone Encounter (Signed)
Attempted to call pt with below information. LVM to return call.

## 2020-07-25 NOTE — Telephone Encounter (Signed)
Pt returned call. Information given. Pt verbalized thanks and understanding.

## 2020-07-28 ENCOUNTER — Other Ambulatory Visit: Payer: Self-pay | Admitting: Hematology and Oncology

## 2020-08-01 ENCOUNTER — Ambulatory Visit (INDEPENDENT_AMBULATORY_CARE_PROVIDER_SITE_OTHER): Payer: 59 | Admitting: Psychology

## 2020-08-01 ENCOUNTER — Other Ambulatory Visit: Payer: Self-pay

## 2020-08-01 DIAGNOSIS — F5089 Other specified eating disorder: Secondary | ICD-10-CM

## 2020-08-03 ENCOUNTER — Encounter (INDEPENDENT_AMBULATORY_CARE_PROVIDER_SITE_OTHER): Payer: Self-pay | Admitting: Family Medicine

## 2020-08-08 NOTE — Progress Notes (Unsigned)
Office: [phone]  /  Fax: [phone]    Date: August 22, 2020   Appointment Start Time: *** Duration: *** minutes Provider: Glennie Isle, Psy.D. Type of Session: Individual Therapy  Location of Patient: {gbptloc:23249} Location of Provider: Provider's Home Type of Contact: Telepsychological Visit via MyChart Video Visit  Session Content: Sierra Gray is a 59 y.o. female presenting for a follow-up appointment to address the previously established treatment goal of increasing coping skills. Today's appointment was a telepsychological visit due to COVID-19. Murphy provided verbal consent for today's telepsychological appointment and she is aware she is responsible for securing confidentiality on her end of the session. Prior to proceeding with today's appointment, Jazzmon's physical location at the time of this appointment was obtained as well a phone number she could be reached at in the event of technical difficulties. Isabella and this provider participated in today's telepsychological service.   This provider conducted a brief check-in and verbally administered the PHQ-9 and GAD-7. *** Serafina was receptive to today's appointment as evidenced by openness to sharing, responsiveness to feedback, and {gbreceptiveness:23401}.  Mental Status Examination:  Appearance: {Appearance:22431} Behavior: {Behavior:22445} Mood: {gbmood:21757} Affect: {Affect:22436} Speech: {Speech:22432} Eye Contact: {Eye Contact:22433} Psychomotor Activity: {Motor Activity:22434} Gait: {gbgait:23404} Thought Process: {thought process:22448}  Thought Content/Perception: {disturbances:22451} Orientation: {Orientation:22437} Memory/Concentration: {gbcognition:22449} Insight/Judgment: {Insight:22446}  Structured Assessments Results: The Patient Health Questionnaire-9 (PHQ-9) is a self-report measure that assesses symptoms and severity of depression over the course of the last two weeks. Valeen obtained a score of ***  suggesting {GBPHQ9SEVERITY:21752}. Jonika finds the endorsed symptoms to be {gbphq9difficulty:21754}. [0= Not at all; 1= Several days; 2= More than half the days; 3= Nearly every day] Little interest or pleasure in doing things ***  Feeling down, depressed, or hopeless ***  Trouble falling or staying asleep, or sleeping too much ***  Feeling tired or having little energy ***  Poor appetite or overeating ***  Feeling bad about yourself --- or that you are a failure or have let yourself or your family down ***  Trouble concentrating on things, such as reading the newspaper or watching television ***  Moving or speaking so slowly that other people could have noticed? Or the opposite --- being so fidgety or restless that you have been moving around a lot more than usual ***  Thoughts that you would be better off dead or hurting yourself in some way ***  PHQ-9 Score ***    The Generalized Anxiety Disorder-7 (GAD-7) is a brief self-report measure that assesses symptoms of anxiety over the course of the last two weeks. Sarahi obtained a score of *** suggesting {gbgad7severity:21753}. Loreena finds the endorsed symptoms to be {gbphq9difficulty:21754}. [0= Not at all; 1= Several days; 2= Over half the days; 3= Nearly every day] Feeling nervous, anxious, on edge ***  Not being able to stop or control worrying ***  Worrying too much about different things ***  Trouble relaxing ***  Being so restless that it's hard to sit still ***  Becoming easily annoyed or irritable ***  Feeling afraid as if something awful might happen ***  GAD-7 Score ***   Interventions:  {Interventions for Progress Notes:23405}  DSM-5 Diagnosis(es): 307.59 (F50.8) Other Specified Feeding or Eating Disorder, Emotional Eating Behaviors  Treatment Goal & Progress: During the initial appointment with this provider, the following treatment goal was established: increase coping skills. Quamesha has demonstrated progress in her goal as  evidenced by {gbtxprogress:22839}. Lacye also {gbtxprogress2:22951}.  Plan: The next appointment will be scheduled in {gbweeks:21758}, which  will be {gbtxmodality:23402}. The next session will focus on {Plan for Next Appointment:23400}.

## 2020-08-09 ENCOUNTER — Other Ambulatory Visit: Payer: Self-pay

## 2020-08-09 ENCOUNTER — Ambulatory Visit (INDEPENDENT_AMBULATORY_CARE_PROVIDER_SITE_OTHER): Payer: 59 | Admitting: Family Medicine

## 2020-08-09 ENCOUNTER — Encounter (INDEPENDENT_AMBULATORY_CARE_PROVIDER_SITE_OTHER): Payer: Self-pay | Admitting: Family Medicine

## 2020-08-09 VITALS — BP 120/76 | HR 79 | Temp 98.9°F | Ht 64.0 in | Wt 204.0 lb

## 2020-08-09 DIAGNOSIS — E559 Vitamin D deficiency, unspecified: Secondary | ICD-10-CM

## 2020-08-09 DIAGNOSIS — F3289 Other specified depressive episodes: Secondary | ICD-10-CM

## 2020-08-09 DIAGNOSIS — Z6835 Body mass index (BMI) 35.0-35.9, adult: Secondary | ICD-10-CM

## 2020-08-10 NOTE — Progress Notes (Signed)
Chief Complaint:   OBESITY Sierra Gray Gray is here to discuss her progress with her obesity treatment plan along with follow-up of her obesity related diagnoses. Sierra Gray Gray is on the Category 2 Plan and states she is following her eating plan approximately 50% of the time. Sierra Gray Gray states she is exercising for 0 minutes 0 times per week.  Today's visit was #: 3 Starting weight: 212 lbs Starting date: 07/11/2020 Today's weight: 204 lbs Today's date: 08/09/2020 Total lbs lost to date: 8 lbs Total lbs lost since last in-office visit: 3 lbs  Interim History: Sierra Gray Gray met with Dr. Mallie Mussel and says it went well.  She has a second appointment in the future.  She says she is skipping lunch less and eating "something" at lunch now.  Usually a little cottage cheese and fruit (pear).  She still does not feel hungry.  No cravings.  Subjective:   1. Vitamin D deficiency Sierra Gray Gray's Vitamin D level was 40.8 on 07/11/2020. She is currently taking OTC vitamin D 5,000 IU each day. She denies nausea, vomiting or muscle weakness.  She doubled the dose of her vitamin D and says she is tolerating it well.  2. Other depression with emotional eating  Emotionally, she feels she is doing very well.  Denies SI and declines medications.  Assessment/Plan:   1. Vitamin D deficiency Low Vitamin D level contributes to fatigue and are associated with obesity, breast, and colon cancer. She agrees to continue to take OTC Vitamin D _0 ,000 IU daily.  Recheck level in 3 months.  2. Other depression with emotional eating  She feels more in control and less depressed/anxious with eating better and weight loss.  Continue prudent nutritional plan, meal/food prep.  3. Class 2 severe obesity with serious comorbidity and body mass index (BMI) of 35.0 to 35.9 in adult, unspecified obesity type Sierra Gray Gray) Sierra Gray Gray is currently in the action stage of change. As such, her goal is to continue with weight loss efforts. She has agreed to the Category 2 Plan.    Exercise goals: As is.  Behavioral modification strategies: increasing lean protein intake, no skipping meals, meal planning and cooking strategies, keeping healthy foods in the home and planning for success.  Sierra Gray Gray has agreed to follow-up with our clinic in 2 weeks. She was informed of the importance of frequent follow-up visits to maximize her success with intensive lifestyle modifications for her multiple health conditions.   Objective:   Blood pressure 120/76, pulse 79, temperature 98.9 F (37.2 C), height _1  (1.626 m), weight 204 lb (92.5 kg), SpO2 99 %. Body mass index is 35.02 kg/m.  General: Cooperative, alert, well developed, in no acute distress. HEENT: Conjunctivae and lids unremarkable. Cardiovascular: Regular rhythm.  Lungs: Normal work of breathing. Neurologic: No focal deficits.   Lab Results  Component Value Date   CREATININE 1.01 (H) 06/19/2020   BUN 12 06/19/2020   NA 142 06/19/2020   K 4.5 06/19/2020   CL 105 06/19/2020   CO2 25 06/19/2020   Lab Results  Component Value Date   ALT 25 06/19/2020   AST 22 06/19/2020   ALKPHOS 76 06/19/2020   BILITOT 0.8 06/19/2020   Lab Results  Component Value Date   HGBA1C 5.6 07/11/2020   HGBA1C 5.5 09/04/2018   Lab Results  Component Value Date   INSULIN 25.7 (H) 07/11/2020   Lab Results  Component Value Date   TSH 0.285 (L) 07/11/2020   Lab Results  Component Value Date   CHOL  263 (H) 07/11/2020   HDL 45 07/11/2020   LDLCALC 170 (H) 07/11/2020   TRIG 255 (H) 07/11/2020   Lab Results  Component Value Date   WBC 6.6 06/19/2020   HGB 13.4 06/19/2020   HCT 41.7 06/19/2020   MCV 87.6 06/19/2020   PLT 357 06/19/2020   Attestation Statements:   Reviewed by clinician on day of visit: allergies, medications, problem list, medical history, surgical history, family history, social history, and previous encounter notes.  Time spent on visit including pre-visit chart review and post-visit care and  charting was 22 minutes.   I, Water quality scientist, CMA, am acting as Location manager for [employer], DO.  I have reviewed the above documentation for accuracy and completeness, and I agree with the above. Mellody Dance, DO

## 2020-08-15 NOTE — Progress Notes (Signed)
Office: [phone]  /  Fax: [phone]    Date: August 29, 2020   Appointment Start Time: 10:00am Duration: 27 minutes Provider: Glennie Isle, Psy.D. Type of Session: Individual Therapy  Location of Patient: Home Location of Provider: Provider's Home Type of Contact: Telepsychological Visit via MyChart Video Visit   Informed Consent: Prior to initiating telepsychological services, Sierra Gray completed an informed consent document, which included the development of a safety plan (e.g., an emergency contact and nearest emergency room) in the event of an emergency/crisis. Sierra Gray expressed understanding of the rationale of the safety plan. Sierra Gray verbally acknowledged understanding she is ultimately responsible for understanding her insurance benefits for telepsychological and in-person services. This provider also reviewed confidentiality, as it relates to telepsychological services, as well as the rationale for telepsychological services (i.e., to reduce exposure risk to COVID-19). Sierra Gray  acknowledged understanding that appointments cannot be recorded without both party consent and she is aware she is responsible for securing confidentiality on her end of the session. Sierra Gray verbally consented to proceed.  Session Content: Sierra Gray is a 59 y.o. female presenting for a follow-up appointment to address the previously established treatment goal of increasing coping skills. Today's appointment was a telepsychological visit due to COVID-19. Sierra Gray provided verbal consent for today's telepsychological appointment and she is aware she is responsible for securing confidentiality on her end of the session. Prior to proceeding with today's appointment, Sierra Gray's physical location at the time of this appointment was obtained as well a phone number she could be reached at in the event of technical difficulties. Sierra Gray and this provider participated in today's telepsychological service.   This provider conducted a  brief check-in. Sierra Gray shared about her recent vacation and other recent events (e.g., helping a friend move). She acknowledged deviations from her meal plan. Recent eating habits were explored. She described making better choices and engaging in portion control. It was reflected she is likely not consuming enough protein. Additionally, psychoeducation regarding triggers for emotional eating was provided. Sierra Gray was provided a handout, and encouraged to utilize the handout between now and the next appointment to increase awareness of triggers and frequency. Sierra Gray agreed. This provider also discussed behavioral strategies for specific triggers, such as placing the utensil down when conversing to avoid mindless eating. Sierra Gray provided verbal consent during today's appointment for this provider to send a handout about triggers via e-mail. Sierra Gray was receptive to today's appointment as evidenced by openness to sharing, responsiveness to feedback, and willingness to explore triggers for emotional eating.  Mental Status Examination:  Appearance: well groomed and appropriate hygiene  Behavior: appropriate to circumstances Mood: euthymic Affect: mood congruent Speech: normal in rate, volume, and tone Eye Contact: appropriate Psychomotor Activity: appropriate Gait: unable to assess Thought Process: linear, logical, and goal directed  Thought Content/Perception: no hallucinations, delusions, bizarre thinking or behavior reported or observed and no evidence of suicidal and homicidal ideation, plan, and intent Orientation: time, person, place, and purpose of appointment Memory/Concentration: memory, attention, language, and fund of knowledge intact  Insight/Judgment: fair  Interventions:  Conducted a brief chart review Provided empathic reflections and validation Employed supportive psychotherapy interventions to facilitate reduced distress and to improve coping skills with identified stressors Psychoeducation  provided regarding triggers for emotional eating  DSM-5 Diagnosis(es): 307.59 (F50.8) Other Specified Feeding or Eating Disorder, Emotional Eating Behaviors  Treatment Goal & Progress: During the initial appointment with this provider, the following treatment goal was established: increase coping skills. Sierra Gray has demonstrated progress in her goal as evidenced by  increased awareness of hunger patterns.   Plan: The next appointment will be scheduled in two weeks, which will be via MyChart Video Visit. The next session will focus on working towards the established treatment goal.

## 2020-08-22 ENCOUNTER — Telehealth (INDEPENDENT_AMBULATORY_CARE_PROVIDER_SITE_OTHER): Payer: 59 | Admitting: Psychology

## 2020-08-29 ENCOUNTER — Telehealth (INDEPENDENT_AMBULATORY_CARE_PROVIDER_SITE_OTHER): Payer: 59 | Admitting: Psychology

## 2020-08-29 ENCOUNTER — Other Ambulatory Visit: Payer: Self-pay

## 2020-08-29 DIAGNOSIS — F5089 Other specified eating disorder: Secondary | ICD-10-CM | POA: Diagnosis not present

## 2020-08-29 NOTE — Progress Notes (Signed)
Office: [phone]  /  Fax: [phone]    Date: September 12, 2020   Appointment Start Time: 9:31am Duration: 34 minutes Provider: Glennie Isle, Psy.D. Type of Session: Individual Therapy  Location of Patient: Home Location of Provider: Provider's Home Type of Contact: Telepsychological Visit via MyChart Video Visit  Session Content: Zyara is a 59 y.o. female presenting for a follow-up appointment to address the previously established treatment goal of increasing coping skills. Today's appointment was a telepsychological visit due to COVID-19. Reshunda provided verbal consent for today's telepsychological appointment and she is aware she is responsible for securing confidentiality on her end of the session. Prior to proceeding with today's appointment, Namine's physical location at the time of this appointment was obtained as well a phone number she could be reached at in the event of technical difficulties. Zarianna and this provider participated in today's telepsychological service.   This provider conducted a brief check-in and verbally administered the PHQ-9 and GAD-7. Brilee reported she is still helping a friend move as well as focusing on seeking employment. She acknowledged she hurt her knee recently; it was recommended she reach out to her doctor. She agreed. Regarding eating, Thais acknowledged challenges, noting recent celebrations and limited protein intake. Reviewed triggers for emotional eating. Psychoeducation regarding SMART goals was provided and Deshonna was engaged in goal setting to help her eat more congruent to her meal plan. The following goal was established: Manika will eat lunch congruent to her meal plan at least 3 out of 7 days a week between now and the next appointment with this provider. Notably, this provider discussed her upcoming maternity leave toward the end of November. Keamber acknowledged understanding given the uncertain nature of the circumstances, this provider may be  out of the office sooner. This provider and [name] discussed referral options and verbal consent was provided for this provider to send a list of referral options via e-mail. All questions/concerns were addressed. Kentley denied any concerns. Overall, Shawana was receptive to today's appointment as evidenced by openness to sharing, responsiveness to feedback, and willingness to work toward the established SMART goal.  Mental Status Examination:  Appearance: well groomed and appropriate hygiene  Behavior: appropriate to circumstances Mood: euthymic Affect: mood congruent Speech: normal in rate, volume, and tone Eye Contact: appropriate Psychomotor Activity: appropriate Gait: unable to assess Thought Process: linear, logical, and goal directed  Thought Content/Perception: no hallucinations, delusions, bizarre thinking or behavior reported or observed and no evidence of suicidal and homicidal ideation, plan, and intent Orientation: time, person, place, and purpose of appointment Memory/Concentration: memory, attention, language, and fund of knowledge intact  Insight/Judgment: fair  Structured Assessments Results: The Patient Health Questionnaire-9 (PHQ-9) is a self-report measure that assesses symptoms and severity of depression over the course of the last two weeks. Briteny obtained a score of 4 suggesting minimal depression. Coretha finds the endorsed symptoms to be not difficult at all. [0= Not at all; 1= Several days; 2= More than half the days; 3= Nearly every day] Little interest or pleasure in doing things 0  Feeling down, depressed, or hopeless 0  Trouble falling or staying asleep, or sleeping too much 2  Feeling tired or having little energy 0  Poor appetite or overeating 1  Feeling bad about yourself --- or that you are a failure or have let yourself or your family down 1  Trouble concentrating on things, such as reading the newspaper or watching television 0  Moving or speaking so slowly  that other  people could have noticed? Or the opposite --- being so fidgety or restless that you have been moving around a lot more than usual 0  Thoughts that you would be better off dead or hurting yourself in some way 0  PHQ-9 Score 4    The Generalized Anxiety Disorder-7 (GAD-7) is a brief self-report measure that assesses symptoms of anxiety over the course of the last two weeks. Nazirah obtained a score of 1 suggesting minimal anxiety. Kennie finds the endorsed symptoms to be not difficult at all. [0= Not at all; 1= Several days; 2= Over half the days; 3= Nearly every day] Feeling nervous, anxious, on edge 1  Not being able to stop or control worrying 0  Worrying too much about different things 0  Trouble relaxing 0  Being so restless that it's hard to sit still 0  Becoming easily annoyed or irritable 0  Feeling afraid as if something awful might happen 0  GAD-7 Score 1   Interventions:  Conducted a brief chart review Verbally administered PHQ-9 and GAD-7 for symptom monitoring Provided empathic reflections and validation Reviewed content from the previous session Employed supportive psychotherapy interventions to facilitate reduced distress and to improve coping skills with identified stressors Engaged patient in goal setting Psychoeducation provided regarding SMART goals  DSM-5 Diagnosis(es): 307.59 (F50.8) Other Specified Feeding or Eating Disorder, Emotional Eating Behaviors  Treatment Goal & Progress: During the initial appointment with this provider, the following treatment goal was established: increase coping skills. Ranyah has demonstrated progress in her goal as evidenced by increased awareness of hunger patterns and increased awareness of triggers for emotional eating. Francesca also demonstrates willingness to work toward the established SMART goal to increase eating congruent to her meal plan.   Plan: Due to Shea's upcoming beach vacation in a couple weeks, the next appointment  will be scheduled in one week, which will be via [place] Visit. The next session will focus on working towards the established treatment goal.

## 2020-08-30 ENCOUNTER — Encounter (INDEPENDENT_AMBULATORY_CARE_PROVIDER_SITE_OTHER): Payer: Self-pay | Admitting: Family Medicine

## 2020-08-30 ENCOUNTER — Other Ambulatory Visit: Payer: Self-pay

## 2020-08-30 ENCOUNTER — Ambulatory Visit (INDEPENDENT_AMBULATORY_CARE_PROVIDER_SITE_OTHER): Payer: 59 | Admitting: Family Medicine

## 2020-08-30 VITALS — BP 110/74 | HR 83 | Temp 98.6°F | Ht 64.0 in | Wt 203.0 lb

## 2020-08-30 DIAGNOSIS — E669 Obesity, unspecified: Secondary | ICD-10-CM | POA: Diagnosis not present

## 2020-08-30 DIAGNOSIS — Z9189 Other specified personal risk factors, not elsewhere classified: Secondary | ICD-10-CM

## 2020-08-30 DIAGNOSIS — I10 Essential (primary) hypertension: Secondary | ICD-10-CM

## 2020-08-30 DIAGNOSIS — Z6834 Body mass index (BMI) 34.0-34.9, adult: Secondary | ICD-10-CM

## 2020-08-30 DIAGNOSIS — E559 Vitamin D deficiency, unspecified: Secondary | ICD-10-CM | POA: Diagnosis not present

## 2020-08-30 DIAGNOSIS — F5089 Other specified eating disorder: Secondary | ICD-10-CM

## 2020-09-01 ENCOUNTER — Other Ambulatory Visit: Payer: Self-pay

## 2020-09-01 ENCOUNTER — Ambulatory Visit
Admission: RE | Admit: 2020-09-01 | Discharge: 2020-09-01 | Disposition: A | Discharge status: Home / Self Care | Payer: 59 | Source: Ambulatory Visit | Attending: Hematology and Oncology | Admitting: Hematology and Oncology

## 2020-09-01 DIAGNOSIS — M8580 Other specified disorders of bone density and structure, unspecified site: Secondary | ICD-10-CM

## 2020-09-03 NOTE — Progress Notes (Signed)
Chief Complaint:   OBESITY Sierra Gray is here to discuss her progress with her obesity treatment plan along with follow-up of her obesity related diagnoses. Sierra Gray is on the Category 2 Plan and states she is following her eating plan approximately 10% of the time. Sierra Gray states she is walking 13,000 steps per day.  Today's visit was #: 4 Starting weight: 212 lbs Starting date: 07/11/2020 Today's weight: 203 lbs Today's date: 08/30/2020 Total lbs lost to date: 9 lbs Total lbs lost since last in-office visit: 1 lb  Interim History: Sierra Gray says that her Sierra Gray is controlled.  She endorses occasional cravings for chocolate or salty foods.  She has been skipping meals, breakfast usually, because of her busy schedule.  She has been eating off plan a lot lately, getting quick and easy meals.  She denies upcoming travel in the near future.  For snacks, she has Oreo's, [employer]ir[employer] chocolate, or a 140 calorie ice cream sandwich.   She says that her mother wants to go out to eat all the time and she has a hard time telling her 'no'.      Subjective:    1. Essential hypertension Review: taking medications as instructed, no medication side effects noted, no chest pain on exertion, no dyspnea on exertion, no swelling of ankles.  She is on Norvasc.  Tolerating week, without side effects.  She has been on it for years.  She is not checking her blood pressure at home, but has a monitor.  BP Readings from Last 3 Encounters:  08/30/20 110/74  08/09/20 120/76  07/25/20 130/85    2. Vitamin D deficiency Sierra Gray's Vitamin D level was 40.8 on 07/11/2020. She is currently taking OTC vitamin D 4,000 IU each day. She denies nausea, vomiting or muscle weakness.  We doubled her OTC vitamin D.  She is tolerating it well, but she is unsure of the dose (she thinks 2,000 IU and she takes 2 tablets).   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  She says it is going well and she is trying to put into place Dr. Louisa Gray recommendations.   4. At risk for osteoporosis Sierra Gray was given approximately 16 minutes of osteoporosis prevention counseling today.   Sierra Gray is at risk for osteopenia and osteoporosis due to Vitamin D deficiency, as well as other risk factors.  We discussed the importance of prudent screenings through her PCP's office for prevention.     Sierra Gray was encouraged to take her Vitamin D and follow her calcium rich diet.  We will continue to monitor vitamin D levels to ensure treatment is appropriate.   It is recommended that she eventually engage in weight bearing exercises and muscle strengthening exercises to help improve bone density and decrease her risk of osteopenia and osteoporosis.   Assessment/Plan:   1. Essential hypertension Sierra Gray is working on healthy weight loss and exercise to improve blood pressure control. We will watch for signs of hypotension as she continues her lifestyle modifications.  Check blood pressure every other day (or, at minimum, 2 days per week).  Continue Norvasc at current dose for now and closely monitor in 2 weeks.   2. Vitamin D deficiency Low Vitamin D level contributes to fatigue and are associated with obesity, breast, and colon cancer. She agrees to continue to take OTC Vitamin D @4 ,000 IU daily and will follow-up for routine testing of Vitamin D, at  least 2-3 times per year to avoid over-replacement.  Will check vitamin D level around 09/24/2020 or so.   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   4. At risk for osteoporosis Sierra Gray was given approximately 16 minutes of osteoporosis prevention counseling today.   Sierra Gray is at risk for osteopenia and osteoporosis due to Vitamin D deficiency, as well as other risk factors.  We discussed the importance of prudent screenings through her PCP's office for  prevention.     Sierra Gray was encouraged to take her Vitamin D and follow her calcium rich diet.  We will continue to monitor vitamin D levels to ensure treatment is appropriate.   It is recommended that she eventually engage in weight bearing exercises and muscle strengthening exercises to help improve bone density and decrease her risk of osteopenia and osteoporosis.   5. Class 1 obesity with serious comorbidity and body mass index (BMI) of 34.0 to 34.9 in adult, unspecified obesity type  Sierra Gray is currently in the action stage of change. As such, her goal is to continue with weight loss efforts. She has agreed to the Category 2 Plan.   Exercise goals: Try to get 13,000 steps everyday.  Behavioral modification strategies: increasing lean protein intake, no skipping meals, meal planning and cooking strategies, better snacking choices ([employer] chips), dealing with family (her mother) sabotage and planning for success.  [name] has agreed to follow-up with our clinic in 2 weeks. She was informed of the importance of frequent follow-up visits to maximize her success with intensive lifestyle modifications for her multiple health conditions.    Objective:   Blood pressure 110/74, pulse 83, temperature 98.6 F (37 C), height 5\' 4"  (1.626 m), weight 203 lb (92.1 kg), SpO2 98 %. Body mass index is 34.84 kg/m.  General: Cooperative, alert, well developed, in no acute distress. HEENT: Conjunctivae and lids unremarkable. Cardiovascular: Regular rhythm.  Lungs: Normal work of breathing. Neurologic: No focal deficits.   Lab Results  Component Value Date   CREATININE 1.01 (H) 06/19/2020   BUN 12 06/19/2020   NA 142 06/19/2020   K 4.5 06/19/2020   CL 105 06/19/2020   CO2 25 06/19/2020   Lab Results  Component Value Date   ALT 25 06/19/2020   AST 22 06/19/2020   ALKPHOS 76 06/19/2020   BILITOT 0.8 06/19/2020   Lab Results  Component Value Date   HGBA1C 5.6 07/11/2020   HGBA1C 5.5 09/04/2018    Lab Results  Component Value Date   INSULIN 25.7 (H) 07/11/2020   Lab Results  Component Value Date   TSH 0.285 (L) 07/11/2020   Lab Results  Component Value Date   CHOL 263 (H) 07/11/2020   HDL 45 07/11/2020   LDLCALC 170 (H) 07/11/2020   TRIG 255 (H) 07/11/2020   Lab Results  Component Value Date   WBC 6.6 06/19/2020   HGB 13.4 06/19/2020   HCT 41.7 06/19/2020   MCV 87.6 06/19/2020   PLT 357 06/19/2020   Attestation Statements:   Reviewed by clinician on day of visit: allergies, medications, problem list, medical history, surgical history, family history, social history, and previous encounter notes.  I, Water quality scientist, CMA, am acting as Location manager for [employer], DO.  I have reviewed the above documentation for accuracy and completeness, and I agree with the above. Mellody Dance, DO

## 2020-09-12 ENCOUNTER — Telehealth (INDEPENDENT_AMBULATORY_CARE_PROVIDER_SITE_OTHER): Payer: 59 | Admitting: Psychology

## 2020-09-12 DIAGNOSIS — F5089 Other specified eating disorder: Secondary | ICD-10-CM | POA: Diagnosis not present

## 2020-09-12 NOTE — Progress Notes (Signed)
Office: [phone]  /  Fax: [phone]    Date: September 20, 2020   Appointment Start Time: 9:36am Duration: 22 minutes Provider: Gaytri Barker, Psy.D. Type of Session: Individual Therapy  Location of Patient: Home Location of Provider: Provider's Home Type of Contact: Telepsychological Visit via MyChart Video Visit  Session Content: This provider called Sierra Gray at 9:35am as Sierra Gray joined today's appointment, but she was unable to hear or see this provider. She left and rejoined today's appointment to resolve the issues. As such, today's appointment was initiated 6 minutes late. Sierra Gray is a 59 y.o. female presenting for a follow-up appointment to address the previously established treatment goal of increasing coping skills. Today's appointment was a telepsychological visit due to COVID-19. Sierra Gray provided verbal consent for today's telepsychological appointment and she is aware she is responsible for securing confidentiality on her end of the session. Prior to proceeding with today's appointment, Sierra Gray's physical location at the time of this appointment was obtained as well a phone number she could be reached at in the event of technical difficulties. Sierra Gray and this provider participated in today's telepsychological service.   This provider conducted a brief check-in. Sierra Gray shared she helped her friend move into a new apartment, which resulted in her feeling busy and being tired. She acknowledged a limited focus on self-care (including eating) due to the aforementioned. Reviewed SMART goal established for lunch. She indicated she met her goal; however, did not have breakfast on those days as she is often not hungry. As such, it was recommended she break down the breakfast meal into smaller meals. She agreed. Psychoeducation regarding the importance of self-care utilizing the oxygen mask metaphor was provided. Psychoeducation regarding pleasurable activities, including its impact on eating and overall  well-being was also provided. Sierra Gray was provided with a handout with various options of pleasurable activities, and was encouraged to engage in one activity a day and additional activities as needed when triggered to emotionally eat. Sierra Gray agreed. Sierra Gray provided verbal consent during today's appointment for this provider to send a handout with pleasurable activities via e-mail. Sierra Gray was receptive to today's appointment as evidenced by openness to sharing, responsiveness to feedback, and willingness to engage in pleasurable activities to assist with coping.  Mental Status Examination:  Appearance: well groomed and appropriate hygiene  Behavior: appropriate to circumstances Mood: euthymic Affect: mood congruent Speech: normal in rate, volume, and tone Eye Contact: appropriate Psychomotor Activity: appropriate Gait: unable to assess Thought Process: linear, logical, and goal directed  Thought Content/Perception: no hallucinations, delusions, bizarre thinking or behavior reported or observed and no evidence of suicidal and homicidal ideation, plan, and intent Orientation: time, person, place, and purpose of appointment Memory/Concentration: memory, attention, language, and fund of knowledge intact  Insight/Judgment: fair  Interventions:  Conducted a brief chart review Provided empathic reflections and validation Reviewed content from the previous session Employed supportive psychotherapy interventions to facilitate reduced distress and to improve coping skills with identified stressors Psychoeducation provided regarding pleasurable activities Psychoeducation provided regarding self-care  DSM-5 Diagnosis(es): 307.59 (F50.8) Other Specified Feeding or Eating Disorder, Emotional Eating Behaviors  Treatment Goal & Progress: During the initial appointment with this provider, the following treatment goal was established: increase coping skills. Sierra Gray has demonstrated progress in her goal as  evidenced by increased awareness of hunger patterns and increased awareness of triggers for emotional eating. Sierra Gray also continues to demonstrate willingness to engage in learned skill(s).  Plan: The next appointment will be scheduled in two weeks, which will be via   MyChart Video Visit. The next session will focus on working towards the established treatment goal.

## 2020-09-13 ENCOUNTER — Other Ambulatory Visit: Payer: Self-pay

## 2020-09-13 ENCOUNTER — Ambulatory Visit (INDEPENDENT_AMBULATORY_CARE_PROVIDER_SITE_OTHER): Payer: 59 | Admitting: Family Medicine

## 2020-09-13 ENCOUNTER — Encounter (INDEPENDENT_AMBULATORY_CARE_PROVIDER_SITE_OTHER): Payer: Self-pay | Admitting: Family Medicine

## 2020-09-13 VITALS — BP 128/78 | HR 78 | Temp 98.4°F | Ht 64.0 in | Wt 200.0 lb

## 2020-09-13 DIAGNOSIS — M8580 Other specified disorders of bone density and structure, unspecified site: Secondary | ICD-10-CM | POA: Diagnosis not present

## 2020-09-13 DIAGNOSIS — Z9189 Other specified personal risk factors, not elsewhere classified: Secondary | ICD-10-CM | POA: Diagnosis not present

## 2020-09-13 DIAGNOSIS — E669 Obesity, unspecified: Secondary | ICD-10-CM

## 2020-09-13 DIAGNOSIS — I10 Essential (primary) hypertension: Secondary | ICD-10-CM

## 2020-09-13 DIAGNOSIS — E559 Vitamin D deficiency, unspecified: Secondary | ICD-10-CM

## 2020-09-13 DIAGNOSIS — Z6834 Body mass index (BMI) 34.0-34.9, adult: Secondary | ICD-10-CM

## 2020-09-13 MED ORDER — AMLODIPINE BESYLATE 5 MG PO TABS: 5.0000 mg | ORAL_TABLET | Freq: Every day | ORAL | 0 refills | Status: DC

## 2020-09-14 NOTE — Progress Notes (Signed)
Chief Complaint:   OBESITY Sierra Gray is here to discuss her progress with her obesity treatment plan along with follow-up of her obesity related diagnoses. Sierra Gray is on the Category 2 Plan and states she is following her eating plan approximately 50% of the time. Sierra Gray states she is walking for 20-30 minutes 3 times per week.  Today's visit was #: 5 Starting weight: 212 lbs Starting date: 07/11/2020 Today's weight: 200 lbs Today's date: 09/13/2020 Total lbs lost to date: 12 lbs Total lbs lost since last in-office visit: 3 lbs   Interim History: Sierra Gray says that things are going well for her.  She does not know what she did differently except for weighing her foods now.   -  Her goal from the last office visit was to increase to 13,000 steps daily.  She did not hit it and says, "frankly, I forgot about that", when asked about it.  Denies hunger or cravings.   No concerns about plan    Assessment/Plan:   1. Essential hypertension Medications: amlodipine 5 mg daily.  No sx or S-E.  Taking them regularly and tol well.     Plan:  - Blood pressure currently is at goal  - Patient will continue current treatment regimen and medication change is not required today  - Counseled patient on pathophysiology of disease and discussed various treatment options, which always includes dietary and lifestyle modification as first line.   - Lifestyle changes such as following our low salt, heart healthy meal plan and engaging in a regular exercise program discussed extensively with patient.   - Ambulatory blood pressure monitoring encouraged at least 2-3 times weekly.  Keep log and bring in every office visit.  Reminded patient that if they ever feel poorly in any way, to check their blood pressure and pulse as well.  - We will continue to monitor closely alongside PCP/ specialists.  Pt reminded to also f/up with those individuals as instructed by them.    - We will monitor for hypotension with  continued weight loss.       Refill Norvasc 5 mg daily #90, 0 refills.  BP Readings from Last 3 Encounters:  09/13/20 128/78  08/30/20 110/74  08/09/20 120/76   Lab Results  Component Value Date   CREATININE 1.01 (H) 06/19/2020    Meds ordered this encounter  Medications  . amLODipine (NORVASC) 5 MG tablet    Sig: Take 1 tablet (5 mg total) by mouth daily.    Dispense:  90 tablet    Refill:  0      2. Vitamin D deficiency Current vitamin D is 40.8, tested on 07/11/2020. Optimal goal > 50 ng/dL. There is also evidence to support a goal of >70 ng/dL in patients with cancer and heart disease.   Plan:  She was previously taking 2,000 IU daily, and we increased to 3,000 IU dailiy after labs were obtained with [name] in mid August.    - Will recheck vitamin D level in mid November, 3 months after she has been on a higher dose with goal of 50+.  Will follow.    3. Osteopenia, unspecified location Bone density recently done.  T-score -1.2.  She had questions as to what the test results were and what the report meant.    Plan:  Weight bearing exercise for 150 minutes per week and strength training for 2 days per week, adequate calcium and vitamin D.  Recheck bone density in 2 years.  4. At risk for complication associated with hypotension Sierra Gray was given approximately 12+ minutes of education and counseling today regarding the condition of hypotension, the pathophysiology of the condition and concerns regarding prevention and treatment of this condition.   We discussed risks of medications used to treat hypertension, which in the setting of weight loss, can inadvertently cause hypotension.    Signs of hypotension such as feeling lightheaded or unsteady, esp when getting up first thing in the AM or off the toilet, were reviewed in detail and all questions were answered.    The use of motivational interviewing was employed as a technique as well today to aid in the treatment of  patient's multiple conditions.    Pt understands importance of proper hydration and also of more prudent home BP monitoring while actively losing weight.  she will call us, or their PCP,  or other specialists who treat their BP with medications, with any questions or concerns that may develop.     5. Class 1 obesity with serious comorbidity and body mass index (BMI) of 34.0 to 34.9 in adult, unspecified obesity type  Sierra Gray is currently in the action stage of change. As such, her goal is to continue with weight loss efforts. She has agreed to the Category 2 Plan.   Exercise goals: As is.  Behavioral modification strategies: decreasing simple carbohydrates, decreasing eating out, meal planning and cooking strategies, keeping healthy foods in the home and planning for success.  Sierra Gray has agreed to follow-up with our clinic in 2 weeks. She was informed of the importance of frequent follow-up visits to maximize her success with intensive lifestyle modifications for her multiple health conditions.     Objective:   Blood pressure 128/78, pulse 78, temperature 98.4 F (36.9 C), height 5\' 4"  (1.626 m), weight 200 lb (90.7 kg), SpO2 97 %. Body mass index is 34.33 kg/m.  General: Cooperative, alert, well developed, in no acute distress. HEENT: Conjunctivae and lids unremarkable. Cardiovascular: Regular rhythm.  Lungs: Normal work of breathing. Neurologic: No focal deficits.   Lab Results  Component Value Date   CREATININE 1.01 (H) 06/19/2020   BUN 12 06/19/2020   NA 142 06/19/2020   K 4.5 06/19/2020   CL 105 06/19/2020   CO2 25 06/19/2020   Lab Results  Component Value Date   ALT 25 06/19/2020   AST 22 06/19/2020   ALKPHOS 76 06/19/2020   BILITOT 0.8 06/19/2020   Lab Results  Component Value Date   HGBA1C 5.6 07/11/2020   HGBA1C 5.5 09/04/2018   Lab Results  Component Value Date   INSULIN 25.7 (H) 07/11/2020   Lab Results  Component Value Date   TSH 0.285 (L) 07/11/2020    Lab Results  Component Value Date   CHOL 263 (H) 07/11/2020   HDL 45 07/11/2020   LDLCALC 170 (H) 07/11/2020   TRIG 255 (H) 07/11/2020   Lab Results  Component Value Date   WBC 6.6 06/19/2020   HGB 13.4 06/19/2020   HCT 41.7 06/19/2020   MCV 87.6 06/19/2020   PLT 357 06/19/2020   Attestation Statements:   Reviewed by clinician on day of visit: allergies, medications, problem list, medical history, surgical history, family history, social history, and previous encounter notes.  I, Water quality scientist, CMA, am acting as Location manager for [employer], DO.  I have reviewed the above documentation for accuracy and completeness, and I agree with the above. Mellody Dance, DO

## 2020-09-18 ENCOUNTER — Telehealth: Payer: Self-pay | Admitting: *Deleted

## 2020-09-18 ENCOUNTER — Other Ambulatory Visit: Payer: Self-pay

## 2020-09-18 DIAGNOSIS — C562 Malignant neoplasm of left ovary: Secondary | ICD-10-CM

## 2020-09-18 NOTE — Telephone Encounter (Signed)
Patient called and rescheduled her appt from this Friday to 10/25. Patient requested labs with her appt

## 2020-09-20 ENCOUNTER — Telehealth (INDEPENDENT_AMBULATORY_CARE_PROVIDER_SITE_OTHER): Payer: 59 | Admitting: Psychology

## 2020-09-20 DIAGNOSIS — F5089 Other specified eating disorder: Secondary | ICD-10-CM | POA: Diagnosis not present

## 2020-09-20 NOTE — Progress Notes (Signed)
Office: [phone]  /  Fax: [phone]    Date: October 04, 2020   Appointment Start Time: 9:30am Duration: 27 minutes Provider: Glennie Isle, Psy.D. Type of Session: Individual Therapy  Location of Patient: Home Location of Provider: Provider's Home Type of Contact: Telepsychological Visit via MyChart Video Visit  Session Content: Sierra Gray is a 59 y.o. female presenting for a follow-up appointment to address the previously established treatment goal of increasing coping skills. Today's appointment was a telepsychological visit due to COVID-19. Sierra Gray provided verbal consent for today's telepsychological appointment and she is aware she is responsible for securing confidentiality on her end of the session. Prior to proceeding with today's appointment, Sierra Gray's physical location at the time of this appointment was obtained as well a phone number she could be reached at in the event of technical difficulties. Sierra Gray and this provider participated in today's telepsychological service.   This provider conducted a brief check-in. Sierra Gray shared about her visit with the [facility] clinic yesterday as well as recent events. More specifically, she reported she continues to lose weight and was provided alternate food options during her visit yesterday. Reviewed pleasurable activities. She discussed reviewing the shared handout, noting she is focusing on crafting.  Psychoeducation regarding mindfulness was provided to further assist with coping. A handout was provided to [place] with further information regarding mindfulness, including exercises. This provider also explained the benefit of mindfulness as it relates to emotional eating. Sierra Gray was encouraged to engage in the provided exercises between now and the next appointment with this provider. Sierra Gray agreed. During today's appointment, Rainah was led through a mindfulness exercise involving her senses. Sierra Gray provided verbal consent during today's appointment for this  provider to send a handout about mindfulness via e-mail. Furthermore, termination planning was discussed. Sierra Gray was receptive to a follow-up appointment in approximately 2 weeks and an additional follow-up/termination appointment in approximately 2 weeks after that. Sierra Gray was receptive to today's appointment as evidenced by openness to sharing, responsiveness to feedback, and willingness to engage in mindfulness exercises to assist with coping.  Mental Status Examination:  Appearance: well groomed and appropriate hygiene  Behavior: appropriate to circumstances Mood: euthymic Affect: mood congruent Speech: normal in rate, volume, and tone Eye Contact: appropriate Psychomotor Activity: appropriate Gait: unable to assess Thought Process: linear, logical, and goal directed  Thought Content/Perception: no hallucinations, delusions, bizarre thinking or behavior reported or observed and no evidence of suicidal and homicidal ideation, plan, and intent Orientation: time, person, place, and purpose of appointment Memory/Concentration: memory, attention, language, and fund of knowledge intact  Insight/Judgment: fair  Interventions:  Conducted a brief chart review Provided empathic reflections and validation Reviewed content from the previous session Employed supportive psychotherapy interventions to facilitate reduced distress and to improve coping skills with identified stressors Psychoeducation provided regarding mindfulness Engaged patient in mindfulness exercise(s) Employed acceptance and commitment interventions to emphasize mindfulness and acceptance without struggle  Discussed termination planning  DSM-5 Diagnosis(es): 307.59 (F50.8) Other Specified Feeding or Eating Disorder, Emotional Eating Behaviors  Treatment Goal & Progress: During the initial appointment with this provider, the following treatment goal was established: increase coping skills. Sierra Gray has demonstrated progress in her  goal as evidenced by increased awareness of hunger patterns and increased awareness of triggers for emotional eating. Sierra Gray also continues to demonstrate willingness to engage in learned skill(s).  Plan: The next appointment will be scheduled in 1-2 weeks, which will be via MyChart Video Visit. The next session will focus on working towards the established treatment goal.

## 2020-09-22 ENCOUNTER — Ambulatory Visit: Payer: 59 | Admitting: Gynecologic Oncology

## 2020-10-02 ENCOUNTER — Encounter: Payer: Self-pay | Admitting: Gynecologic Oncology

## 2020-10-02 ENCOUNTER — Inpatient Hospital Stay (HOSPITAL_BASED_OUTPATIENT_CLINIC_OR_DEPARTMENT_OTHER): Payer: 59 | Admitting: Gynecologic Oncology

## 2020-10-02 ENCOUNTER — Other Ambulatory Visit: Payer: Self-pay

## 2020-10-02 ENCOUNTER — Inpatient Hospital Stay: Payer: 59 | Attending: Hematology and Oncology

## 2020-10-02 VITALS — BP 137/72 | HR 78 | Temp 99.2°F | Resp 16 | Ht 64.0 in | Wt 202.0 lb

## 2020-10-02 DIAGNOSIS — Z9221 Personal history of antineoplastic chemotherapy: Secondary | ICD-10-CM | POA: Diagnosis not present

## 2020-10-02 DIAGNOSIS — M8580 Other specified disorders of bone density and structure, unspecified site: Secondary | ICD-10-CM | POA: Diagnosis not present

## 2020-10-02 DIAGNOSIS — C562 Malignant neoplasm of left ovary: Secondary | ICD-10-CM | POA: Insufficient documentation

## 2020-10-02 DIAGNOSIS — Z90722 Acquired absence of ovaries, bilateral: Secondary | ICD-10-CM | POA: Insufficient documentation

## 2020-10-02 DIAGNOSIS — Z79899 Other long term (current) drug therapy: Secondary | ICD-10-CM | POA: Insufficient documentation

## 2020-10-02 LAB — COMPREHENSIVE METABOLIC PANEL
ALT: 22 U/L (ref 0–44)
AST: 16 U/L (ref 15–41)
Albumin: 4.2 g/dL (ref 3.5–5.0)
Alkaline Phosphatase: 75 U/L (ref 38–126)
Anion gap: 7 (ref 5–15)
BUN: 15 mg/dL (ref 6–20)
CO2: 28 mmol/L (ref 22–32)
Calcium: 10.4 mg/dL — ABNORMAL HIGH (ref 8.9–10.3)
Chloride: 106 mmol/L (ref 98–111)
Creatinine, Ser: 0.92 mg/dL (ref 0.44–1.00)
GFR, Estimated: >60 mL/min (ref >60)
Glucose, Bld: 93 mg/dL (ref 70–99)
Potassium: 3.9 mmol/L (ref 3.5–5.1)
Sodium: 141 mmol/L (ref 135–145)
Total Bilirubin: 0.8 mg/dL (ref 0.3–1.2)
Total Protein: 7.5 g/dL (ref 6.5–8.1)

## 2020-10-02 LAB — CBC WITH DIFFERENTIAL/PLATELET
Abs Immature Granulocytes: 0.03 10*3/uL (ref 0.00–0.07)
Basophils Absolute: 0.1 10*3/uL (ref 0.0–0.1)
Basophils Relative: 1 %
Eosinophils Absolute: 0.2 10*3/uL (ref 0.0–0.5)
Eosinophils Relative: 2 %
HCT: 41.6 % (ref 36.0–46.0)
Hemoglobin: 13.4 g/dL (ref 12.0–15.0)
Immature Granulocytes: 0 %
Lymphocytes Relative: 21 %
Lymphs Abs: 2.1 10*3/uL (ref 0.7–4.0)
MCH: 28.2 pg (ref 26.0–34.0)
MCHC: 32.2 g/dL (ref 30.0–36.0)
MCV: 87.6 fL (ref 80.0–100.0)
Monocytes Absolute: 0.8 10*3/uL (ref 0.1–1.0)
Monocytes Relative: 8 %
Neutro Abs: 6.5 10*3/uL (ref 1.7–7.7)
Neutrophils Relative %: 68 %
Platelets: 393 10*3/uL (ref 150–400)
RBC: 4.75 MIL/uL (ref 3.87–5.11)
RDW: 14.4 % (ref 11.5–15.5)
WBC: 9.6 10*3/uL (ref 4.0–10.5)
nRBC: 0 % (ref 0.0–0.2)

## 2020-10-02 NOTE — Patient Instructions (Signed)
Please notify Dr Denman George at phone number [phone] if you notice vaginal bleeding, new pelvic or abdominal pains, bloating, feeling full easy, or a change in bladder or bowel function.   Please contact Dr Serita Grit office (at [phone]) in January to request an appointment with her for April, 2022. Please ask for a lab appointment for CA 125 check that same day.

## 2020-10-02 NOTE — Progress Notes (Signed)
Follow-up Note: Gyn-Onc  Sierra Gray 59 y.o. female  CC:  Chief Complaint  Patient presents with  . 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 S/p adjuvant chemotherapy with 6 cycles carboplatin and paclitaxel completed 01/29/18.   Ovarian cancer history:  Complete clinical response. Recommendation is for continued 6 monthly surveillance visits until February, 2023.  ATM mutation:  She sees Dr Alvy Bimler for prophylactic anastrazole for breast cancer risk reduction related to her ATM mutation. Pancreatic MRI for screening purposes was normal in April, 2019.  HPI: Patient was seen in consultation at the request of Dr. Nicoletta Dress.  Patient is a very pleasant 59 year old gravida 1 para 0 who in late August after having her [place] visit set up from the dental care and felt some abdominal discomfort. The pain increased and she subsequently that evening had a temperature of 102. This was corresponding with increased abdominal pain and then the abdominal pain dissipated. Since then she's had a occasional pain and last Monday. Due to the persistent discomfort she saw her primary care physician. She describes the pain as a occurring in both her right and left lower quadrants. She did not notice any upper abdominal discomfort. There's been no change in her bowel or bladder habits. She does endorse approximately a 20-25 pound weight loss in the last 6-9 months. Much of this has been intentional. An Thanksgiving of last year to the Christmas holiday she lost 10 pounds which was not intentional but since then her weight loss has been intentional and she feels that she's eating well.  She underwent a CT scan on 08/19/2017. It revealed a trace amount of ascites in the peritoneal cavity seen prior dominant weight in the lateral paracolic gutter and along the liver capsule. There was no  nodule letter peritoneal enhancement identified to suggest peritoneal carcinomatosis. There really does state that there is a mass that is cystic and solid that replaces the entire uterus. It extends caudally to the cervix and appears to invade the upper one third of the vagina. The caudal margin of the neoplasm made directly invading the posterior bladder dome to the medial thickness is normal. No fat separate the mass from the adjacent upper third of the rectum or from the mid sigmoid. The mass measures 12 x 13 x 20 cm in size and posteriorly and laterally displaces the right ovary. The left ovary was not competent to be identified. Of note the patient had undergone a laparoscopic hysterectomy at [facility] approximate 14 years ago secondary to fibroids.  She has not yet had tumor markers drawn. She states she's never had a colonoscopy. She's not sure if her last mammogram was in 2017 but she thinks she might be due this year. She does have family history of cancer her father died of pancreatic cancer in his late 100s. His maternal aunt with breast cancer in her 26s. A paternal grandfather died of colon cancer at 78. She does endorse allergies to chlorhexidine which causes a rash. This was an issue when she had knee surgery on the right. She believes Betadine is okay.  On 09/11/17 she underwent ex lap, BSO, omentectomy, pelvic and para-aortic lymphadenectomy, radical tumor debulking. Intraoperative findings were significant for a 20+cm left ovarian mass with nromal right ovary and omentum. No extraovarian disease was seen. Frozen confirmed adenocarcinoma.  There was no gross residual disease at completion of the surgery.  Final  pathology confirmed a 20cm grade 1 endometrioid left ovarian adenocarcinoma with surface involvement but negative washings (stage IC2).  All other resected tissue (including omentum, nodes and peritoneal biopsy and washings) were negative.  In accordance with NCCN guidelines she was  recommended adjuvant carboplatin and paclitaxel chemotherapy and her case was discussed at multi-disciplinary tumor conference.  She went on to complete adjuvant chemotherapy with 6 cycles of carboplatin and paclitaxel in February, 2019. She tolerated therapy well and had a complete clinical response.   She presents today for follow-up. She had a ventral abdominal hernia for which she is had surgery at [facility] in June, 2019.  She underwent a laparoscopic assisted right hemicolectomy with Dr Hassell Done on 09/11/18. It showed benign tubular adenomas.  CA 125 was normal at 10 on 08/31/18.  CA 125 was normal at 9.7 on 11/21/19.  Interval Hx:  On 12/21/19 CA 125 was normal at 9.5. On 06/19/20 CA 125 was normal at 12.5.   She has no symptoms concerning for recurrent cancer.  Review of Systems: Constitutional:  Denies fever.  Skin: no new lesions Cardiovascular: No chest pain, shortness of breath, or edema  Pulmonary: No cough or wheeze.  Gastro Intestinal: Reporting intermittent lower abdominal soreness.  No nausea, vomiting, constipation, or diarrhea reported. No bright red blood per rectum or change in bowel movement.  Genitourinary: Denies vaginal bleeding and discharge.  Musculoskeletal: no masses   Psychology: No complaints  Current Meds:  Outpatient Encounter Medications as of 10/02/2020  Medication Sig  . amLODipine (NORVASC) 5 MG tablet Take 1 tablet (5 mg total) by mouth daily.  Marland Kitchen anastrozole (ARIMIDEX) 1 MG tablet TAKE 1 TABLET BY MOUTH EVERY DAY  . Apple Cider Vinegar 600 MG CAPS Take 1,200 mg by mouth 2 (two) times daily. Takes in morning and in evening.  . cetirizine (ZYRTEC) 10 MG tablet Take 10 mg by mouth daily.  . Cholecalciferol (VITAMIN D3) 125 MCG (5000 UT) CAPS Take 5,000 Units by mouth daily.   . Cinnamon 500 MG TABS Take 2,000 mg by mouth 2 (two) times daily.  Marland Kitchen ibuprofen (ADVIL,MOTRIN) 200 MG tablet Take 400 mg by mouth every 6 (six) hours as needed.  . Inulin (FIBER  CHOICE PO) Take 10 g by mouth daily.  . Liniments (SALONPAS PAIN RELIEF PATCH EX) Place 1 patch onto the skin daily as needed (knee pain).  . Magnesium Gluconate 550 MG TABS Take 500 mg by mouth daily.   . Melatonin 5 MG TABS Take 5 mg by mouth daily.   . Multiple Vitamins-Minerals (HAIR SKIN & NAILS ADVANCED PO) Take 1 tablet by mouth daily.  . Turmeric 500 MG TABS Take 500 mg by mouth daily.  . [DISCONTINUED] AMBULATORY NON FORMULARY MEDICATION Medication Name: Turmeric with ginger 500 mg tablet-1 tablet by mouth daily  . [DISCONTINUED] ZINC-VITAMIN C PO Take 1,000 mg by mouth daily.   No facility-administered encounter medications on file as of 10/02/2020.    Allergy:  Allergies  Allergen Reactions  . Bee Venom Anaphylaxis  . Chlorhexidine Rash    Surgical prep wash  . Paclitaxel Other (See Comments)    Experienced severe back pain and flushing  . Bacitracin Rash  . Iodine Rash  . Other Rash    Ortho prep SURGICAL GLUE--RASH, VERY BOTHERSOME, REQUIRED BENADRYL     Social Hx:   Social History   Socioeconomic History  . Marital status: Single    Spouse name: Not on file  . Number of children: Not on  file  . Years of education: Not on file  . Highest education level: Not on file  Occupational History  . Occupation: Freight forwarder  Tobacco Use  . Smoking status: Never Smoker  . Smokeless tobacco: Never Used  Vaping Use  . Vaping Use: Never used  Substance and Sexual Activity  . Alcohol use: Yes    Comment: Socially drinks wine/beer  . Drug use: No  . Sexual activity: Never  Other Topics Concern  . Not on file  Social History Narrative  . Not on file   Social Determinants of Health   Financial Resource Strain:   . Difficulty of Paying Living Expenses: Not on file  Food Insecurity:   . Worried About Charity fundraiser in the Last Year: Not on file  . Ran Out of Food in the Last Year: Not on file  Transportation Needs:   . Lack of Transportation (Medical): Not on  file  . Lack of Transportation (Non-Medical): Not on file  Physical Activity:   . Days of Exercise per Week: Not on file  . Minutes of Exercise per Session: Not on file  Stress:   . Feeling of Stress : Not on file  Social Connections:   . Frequency of Communication with Friends and Family: Not on file  . Frequency of Social Gatherings with Friends and Family: Not on file  . Attends Religious Services: Not on file  . Active Member of Clubs or Organizations: Not on file  . Attends Archivist Meetings: Not on file  . Marital Status: Not on file  Intimate Partner Violence:   . Fear of Current or Ex-Partner: Not on file  . Emotionally Abused: Not on file  . Physically Abused: Not on file  . Sexually Abused: Not on file    Past Surgical Hx:  Past Surgical History:  Procedure Laterality Date  . ABDOMINAL HYSTERECTOMY  2009   Total laparoscopic hyst for fibroids  . BILATERAL SALPINGECTOMY Bilateral 09/11/2017   Procedure: BILATERAL SALPINGECTOMY WITH OOPHERECTOMY;  Surgeon: Everitt Amber, MD;  Location: [facility];  Service: Gynecology;  Laterality: Bilateral;  . BREAST BIOPSY  12/16/2014   2 lumps in  right breast removed  . BREAST LUMPECTOMY Left   . BREAST REDUCTION SURGERY    . EXCISION OF SKIN TAG N/A 09/11/2017   Procedure: EXCISION OF SKIN TAG ON THIGH;  Surgeon: Everitt Amber, MD;  Location: [facility];  Service: Gynecology;  Laterality: N/A;  . IR FLUORO GUIDE PORT INSERTION RIGHT  11/03/2017  . IR REMOVAL TUN ACCESS W/ PORT W/O FL MOD SED  03/17/2018  . IR US GUIDE VASC ACCESS RIGHT  11/03/2017  . KNEE SURGERY Right    x3  . LAPAROSCOPIC PARTIAL RIGHT COLECTOMY Right 09/11/2018   Procedure: LAPAROSCOPIC ASSISTED  PARTIAL RIGHT HEMI-COLECTOMY ERAS PATHWAY;  Surgeon: Johnathan Hausen, MD;  Location: [facility];  Service: General;  Laterality: Right;  . LAPAROTOMY Bilateral 09/11/2017   Procedure: EXPLORATORY LAPAROTOMY;  Surgeon: Everitt Amber, MD;  Location: [facility];  Service: Gynecology;   Laterality: Bilateral;  . LAPAROTOMY WITH STAGING N/A 09/11/2017   Procedure: PELVIC AND PARA AORTIC LYMPH NODE  DISSECTION;  Surgeon: Everitt Amber, MD;  Location: [facility];  Service: Gynecology;  Laterality: N/A;  . OMENTECTOMY N/A 09/11/2017   Procedure: OMENTECTOMY;  Surgeon: Everitt Amber, MD;  Location: [facility];  Service: Gynecology;  Laterality: N/A;  . REDUCTION MAMMAPLASTY    . Harvey EXTRACTION      Past Medical  Hx:  Past Medical History:  Diagnosis Date  . Anemia    hx of  . Anemia   . Anxiety   . Arthritis    oa knees  . Back pain   . Breast lump in female    benign  . Constipation   . Depression   . Family history of pancreatic cancer 11/27/2017  . Family history of prostate cancer 11/27/2017  . Fibroids   . Hyperlipidemia   . Hypertension   . Joint pain   . Migraine    hx of   . Osteoarthritis   . Ovarian cancer (HCC)   . Pelvic mass in female   . Pelvic mass in female   . PONV (postoperative nausea and vomiting)    nausea only  . Sleep apnea   . SOB (shortness of breath)   . Swelling of both lower extremities   . Vitamin D deficiency     Oncology Hx:  Oncology History Overview Note  Genetic testing came back positive for ATM variant   Left ovarian epithelial cancer (HCC)  08/19/2017 Imaging   She had outside CT scan which showed large abdominal mass   08/27/2017 Tumor Marker   Patient's tumor was tested for the following markers: CA-125 Results of the tumor marker test revealed 904.5   09/11/2017 Pathology Results   1. 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 Soft tissue, biopsy, right medial thigh MATURE LIPOMA 3. Ovary and fallopian tube, right ENDOMETRIOMA AND SIMPLE SEROUS CYST WITH FALLOPIAN TUBE ADHESION NEGATIVE FOR MALIGNANCY 4. Omentum, resection for  tumor BENIGN OMENTUM NEGATIVE FOR CARCINOMA 5. Lymph nodes, regional resection, right pelvic EIGHT BENIGN LYMPH NODES (0/8) 6. Lymph nodes, regional resection, left pelvic SEVEN BENIGN LYMPH NODES (0/7) 7. Lymph node, biopsy, right para-aortic FOUR BENIGN LYMPH NODES (0/4) 8. Lymph node, biopsy, left para-aortic ONE BENIGN LYMPH NODE (0/1) 9. Peritoneum, biopsy, left abdominal BENIGN FIBROMUSCULAR TISSUE 10. Peritoneum, biopsy, right abdominal BENIGN FIBROMUSCULAR TISSUE WITH SEROSITIS 11. Peritoneum, biopsy, pelvic FIBROADIPOSE TISSUE WITH SEROSITIS  Specimen(s): Ovary and fallopian tube Procedure: (including lymph node sampling): salpingo-oophorectomy Primary tumor site (including laterality): Left ovary Ovarian surface involvement: Yes Ovarian capsule intact without fragmentation: intact Maximum tumor size (cm): 20.0 cm Histologic type: Endometrioid carcinoma Grade: 1 Peritoneal implants: (specify invasive or non-invasive): Negative Pelvic extension (list additional structures on separate lines and if involved): Negative Lymph nodes: number examined 20 ; number positive 0 TNM code: pT1c2, pN0, pMx FIGO Stage (based on pathologic findings, needs clinical correlation): IC2   09/11/2017 Surgery   Preoperative Diagnosis: 1. left adnexal mass.  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   Procedure(s) Performed: 1. Exploratory laparotomy with bilateral salpingo-oophorectomy, pelvic and para-aortic lymph node dissection, omentectomy and radical debulking for ovarian cancer (CPT [id number])  Surgeon: Adolphus Birchwood, M.D. Operative Findings:20 cm left ovarian mass.   No intraperitoneal rupture occurred;  Frozen pathology was consistent with adenocarcinoma; right tube and ovary normal in appearance; 3) normal bilateral pelvic and para-aortic lymph nodes and omentum; small and large bowel to palpation. This represented an optimal cytoreduction  with no gross visible disease remaining.    10/09/2017 Tumor Marker   Patient's tumor was tested for the following markers: CA-125 Results of the tumor marker test revealed 68.7   10/15/2017 Adverse Reaction   She developed reaction  to Paclitaxel.   10/15/2017 - 01/29/2018 Chemotherapy   She received carboplatin. Due to infusion reaction to Taxol with cycle 1, treatment was switched to carboplatin and Abraxane   11/03/2017 Procedure   Successful placement of a right internal jugular approach power injectable Port-A-Cath. The catheter is ready for immediate use.   11/27/2017 Tumor Marker   Patient's tumor was tested for the following markers: CA-125 Results of the tumor marker test revealed 27.7   12/12/2017 Genetic Testing   The patient had genetic testing due to a personal history of ovarian cancer and a family history of pancreatic cancer (and possibly breast cancer).  The [facility] Hereditary Cancer Panel + tumor HRD analysis was ordered from the laboratory [facility]. The [facility] gene panel offered by [employer] includes sequencing and deletion/duplication testing of the following 29 genes: APC, ATM, BARD1, BMPR1A, BRCA1, BRCA2, BRIP1, CHD1, CDK4, CDKN2A, CHEK2, EPCAM (large rearrangement only), HOXB13, MLH1, MSH2, MSH6, MUTYH, NBN, PALB2, PMS2, PTEN, RAD51C, RAD51D, SMAD4, STK11, and TP53. Sequencing was performed for select regions of POLE and POLD1, and large rearrangement analysis was performed for select regions of GREM1.  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 [id number] (p.[id number]).  The date of this test report is 12/12/2017.   01/08/2018 Tumor Marker   Patient's tumor was tested for the following markers: CA-125 Results of the tumor marker test revealed 17.5   03/02/2018 Tumor Marker   Patient's tumor was tested for the following markers: CA-125 Results of the tumor marker test revealed 11.4   03/14/2018 Imaging   MRCP 1. No worrisome  pancreatic lesions are currently present. 2. Cholelithiasis. 3. Small benign-appearing bilateral renal cysts. 4. Midline anterior abdominal wall laxity seen on coronal images, potential small hernia in this vicinity without observed complication   08/13/1739 Procedure   Successful right IJ vein Port-A-Cath explant.   12/21/2019 Tumor Marker   Patient's tumor was tested for the following markers: CA-125 Results of the tumor marker test revealed 9.5.   06/19/2020 Tumor Marker   Patient's tumor was tested for the following markers: CA-125 Results of the tumor marker test revealed 12.5   09/01/2020 Imaging   The BMD measured at Femur Neck Left is 0.866 g/cm2 with a T-score of -1.2. This patient is considered osteopenic according to [facility]) criteria     Family Hx:  Family History  Problem Relation Age of Onset  . Hypertension Mother   . Hyperlipidemia Mother   . Thyroid disease Mother   . Pancreatic cancer Father 38       died 2 months after dx  . Hypertension Father        questionable  . Prostate cancer Father 56       had surgery for it  . Cancer Father   . Depression Father   . Sleep apnea Father   . Obesity Father   . Breast cancer Maternal Aunt        dx 47's- she had surgery, think it was cancer but could have been just a lump removed  . Heart disease Maternal Aunt   . Lung cancer Maternal Aunt        primary  . Heart attack Maternal Grandmother   . Diabetes Maternal Grandfather   . Dementia Maternal Grandfather   . Osteoporosis Paternal Grandmother   . Colon cancer Paternal Grandfather 44  . Stomach cancer Neg Hx   . Esophageal cancer Neg Hx     Vitals:  Blood  pressure 137/72, pulse 78, temperature 99.2 F (37.3 C), temperature source Tympanic, resp. rate 16, height $RemoveBe'5\' 4"'vGLxAaYCw$  (1.626 m), weight 202 lb (91.6 kg), SpO2 100 %.  Physical Exam: Well-nourished well-developed female in no acute distress.  Neck: Supple, no lymphadenopathy, no  thyromegaly.  Lungs: Clear to auscultation bilaterally.  Cardiac: Regular rate and rhythm.  Abdomen: Obese, soft, nontender, nondistended.  Groins: No lymphadenopathy.  Extremities no edema.  Pelvic: no palpable masses in pelvis on vaginal bimanual exam.    Thereasa Solo, MD 10/02/2020, 2:21 PM

## 2020-10-03 ENCOUNTER — Telehealth: Payer: Self-pay

## 2020-10-03 ENCOUNTER — Ambulatory Visit (INDEPENDENT_AMBULATORY_CARE_PROVIDER_SITE_OTHER): Payer: 59 | Admitting: Adult Health

## 2020-10-03 ENCOUNTER — Encounter (INDEPENDENT_AMBULATORY_CARE_PROVIDER_SITE_OTHER): Payer: Self-pay | Admitting: Adult Health

## 2020-10-03 VITALS — BP 119/76 | HR 91 | Temp 98.0°F | Ht 64.0 in | Wt 198.0 lb

## 2020-10-03 DIAGNOSIS — E559 Vitamin D deficiency, unspecified: Secondary | ICD-10-CM | POA: Diagnosis not present

## 2020-10-03 DIAGNOSIS — E8881 Metabolic syndrome: Secondary | ICD-10-CM | POA: Diagnosis not present

## 2020-10-03 DIAGNOSIS — I10 Essential (primary) hypertension: Secondary | ICD-10-CM | POA: Diagnosis not present

## 2020-10-03 DIAGNOSIS — E669 Obesity, unspecified: Secondary | ICD-10-CM | POA: Diagnosis not present

## 2020-10-03 DIAGNOSIS — Z6834 Body mass index (BMI) 34.0-34.9, adult: Secondary | ICD-10-CM

## 2020-10-03 LAB — CA 125: Cancer Antigen (CA) 125: 10.6 U/mL (ref 0.0–38.1)

## 2020-10-03 NOTE — Telephone Encounter (Signed)
Told Sierra Gray that her CA-125 was stable and within normal range at 10.6 per Melissa Cross,NP.

## 2020-10-04 ENCOUNTER — Telehealth (INDEPENDENT_AMBULATORY_CARE_PROVIDER_SITE_OTHER): Payer: 59 | Admitting: Psychology

## 2020-10-04 DIAGNOSIS — E8881 Metabolic syndrome: Secondary | ICD-10-CM | POA: Insufficient documentation

## 2020-10-04 DIAGNOSIS — F5089 Other specified eating disorder: Secondary | ICD-10-CM

## 2020-10-04 DIAGNOSIS — E559 Vitamin D deficiency, unspecified: Secondary | ICD-10-CM | POA: Insufficient documentation

## 2020-10-04 DIAGNOSIS — I10 Essential (primary) hypertension: Secondary | ICD-10-CM | POA: Insufficient documentation

## 2020-10-04 NOTE — Progress Notes (Signed)
Office: [phone]  /  Fax: [phone]    Date: October 16, 2020   Appointment Start Time: 9:29am Duration: 32 minutes Provider: Glennie Isle, Psy.D. Type of Session: Individual Therapy  Location of Patient: Home Location of Provider: Provider's Home Type of Contact: Telepsychological Visit via MyChart Video Visit  Session Content: Sierra Gray is a 59 y.o. female presenting for a follow-up appointment to address the previously established treatment goal of increasing coping skills. Today's appointment was a telepsychological visit due to COVID-19. Sierra Gray provided verbal consent for today's telepsychological appointment and she is aware she is responsible for securing confidentiality on her end of the session. Prior to proceeding with today's appointment, Sierra Gray's physical location at the time of this appointment was obtained as well a phone number she could be reached at in the event of technical difficulties. Sierra Gray and this provider participated in today's telepsychological service.   This provider conducted a brief check-in. 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 Regarding eating and weight loss, Sierra Gray feels she is "stagnant." She acknowledged continuing to have to challenges with sweets and eating lunch congruent to the meal plan. This was further explored. This provider and Sierra Gray discussed eating smaller meals frequently throughout the day to ensure she is eating all foods on the plan. In addition, Sierra Gray discussed a plan to engage in other activities in the evening to help with eating sweets out of habit. Session focused further on mindfulness to assist with coping. She reported engaging in mindfulness exercises to help "stay in the moment" when engaging in activities. Sierra Gray was led through a mindfulness exercise (urge surfing) and her experience was processed. Sierra Gray provided verbal consent during today's appointment for this provider to send the handout for  today's exercise via e-mail. Sierra Gray was receptive to today's appointment as evidenced by openness to sharing, responsiveness to feedback, and willingness to continue engaging in mindfulness exercises.  Mental Status Examination:  Appearance: well groomed and appropriate hygiene  Behavior: appropriate to circumstances Mood: euthymic Affect: mood congruent Speech: normal in rate, volume, and tone Eye Contact: appropriate Psychomotor Activity: appropriate Gait: unable to assess Thought Process: linear, logical, and goal directed  Thought Content/Perception: no hallucinations, delusions, bizarre thinking or behavior reported or observed and no evidence of suicidal and homicidal ideation, plan, and intent Orientation: time, person, place, and purpose of appointment Memory/Concentration: memory, attention, language, and fund of knowledge intact  Insight/Judgment: fair  Interventions:  Conducted a brief chart review Provided empathic reflections and validation Reviewed content from the previous session Employed supportive psychotherapy interventions to facilitate reduced distress and to improve coping skills with identified stressors Engaged patient in mindfulness exercise(s) Employed acceptance and commitment interventions to emphasize mindfulness and acceptance without struggle  DSM-5 Diagnosis(es): 307.59 (F50.8) Other Specified Feeding or Eating Disorder, Emotional Eating Behaviors  Treatment Goal & Progress: During the initial appointment with this provider, the following treatment goal was established: increase coping skills. Sierra Gray demonstrated progress in her goal as evidenced by increased awareness of hunger patterns and increased awareness of triggers for emotional eating. Sierra Gray also continues to demonstrate willingness to engage in learned skill(s).  Plan: Sierra Gray declined future appointments with this provider at this time, noting, "I think I'm okay." She acknowledged understanding that  she may request a follow-up appointment with this provider in the future (following this provider's maternity leave as previously discussed) as long as she is still established with the clinic. No further follow-up planned by this provider.

## 2020-10-04 NOTE — Progress Notes (Signed)
Chief Complaint:   OBESITY Sierra Gray is here to discuss her progress with her obesity treatment plan along with follow-up of her obesity related diagnoses. Sierra Gray is on the Category 2 Plan and states she is following her eating plan approximately 25% of the time. Sierra Gray states she is walking 60 minutes 2 times per week.  Today's visit was #: 6 Starting weight: 212 lbs Starting date: 07/11/2020 Today's weight: 198 lbs Today's date: 10/03/2020 Total lbs lost to date: 14 Total lbs lost since last in-office visit: 2  Interim History: Sierra Gray went on a girls' weekend for 3 days and enjoyed cocktails and followed [facility]. She then went to [facility] with her mother where she walked frequently and focused on lean protein intake.  Subjective:   Essential hypertension. Blood pressure and heart rate were stable on today's office visit. Sierra Gray is on amlodipine 5 mg daily and denies lower extremity edema.  BP Readings from Last 3 Encounters:  10/03/20 119/76  10/02/20 137/72  09/13/20 128/78   Lab Results  Component Value Date   CREATININE 0.92 10/02/2020   CREATININE 1.01 (H) 06/19/2020   CREATININE 1.01 (H) 12/21/2019   Vitamin D deficiency. Sierra Gray is on OTC Vitamin D3 3,000 IU daily and an OTC multivitamin.   Ref. Range 07/11/2020 13:50  Vitamin D, 25-Hydroxy Latest Ref Range: 30.0 - 100.0 ng/mL 40.8   Insulin resistance. Sierra Gray has a diagnosis of insulin resistance based on her elevated fasting insulin level >5. She continues to work on diet and exercise to decrease her risk of diabetes. Insulin level was elevated to 25.7 on 07/11/2020. There are previously noted elevated blood glucose level readings in [facility].  Lab Results  Component Value Date   INSULIN 25.7 (H) 07/11/2020   Lab Results  Component Value Date   HGBA1C 5.6 07/11/2020   Assessment/Plan:   Essential hypertension. Sierra Gray is working on healthy weight loss and exercise to improve blood pressure control. We will  watch for signs of hypotension as she continues her lifestyle modifications. She will continue her calcium channel blocker as directed and will monitor for signs/symptoms of hypotension.  Vitamin D deficiency. Low Vitamin D level contributes to fatigue and are associated with obesity, breast, and colon cancer. She will follow-up for routine testing of Vitamin D every 3 months.   Insulin resistance. Sierra Gray will continue to work on weight loss, continue regular walking, increasing protein intake, and decreasing simple carbohydrates to help decrease the risk of diabetes. Sierra Gray agreed to follow-up with [name] as directed to closely monitor her progress.   Class 1 obesity with serious comorbidity and body mass index (BMI) of 34.0 to 34.9 in adult, unspecified obesity type.  Sierra Gray is currently in the action stage of change. As such, her goal is to continue with weight loss efforts. She has agreed to the Category 2 Plan.    Handouts were provided on Additional  Breakfast Options and Protein Equivalent Snack List.  Exercise goals: Sierra Gray will continue walking 60 minutes 2 times per week.  Behavioral modification strategies: increasing lean protein intake, decreasing simple carbohydrates, meal planning and cooking strategies, better snacking choices and planning for success.  Sierra Gray has agreed to follow-up with our clinic in 2 weeks. She was informed of the importance of frequent follow-up visits to maximize her success with intensive lifestyle modifications for her multiple health conditions.   Objective:   Blood pressure 119/76, pulse 91, temperature 98 F (36.7 C), height 5\' 4"  (1.626 m), weight  198 lb (89.8 kg), SpO2 96 %. Body mass index is 33.99 kg/m.  General: Cooperative, alert, well developed, in no acute distress. HEENT: Conjunctivae and lids unremarkable. Cardiovascular: Regular rhythm.  Lungs: Normal work of breathing. Neurologic: No focal deficits.   Lab Results  Component Value Date    CREATININE 0.92 10/02/2020   BUN 15 10/02/2020   NA 141 10/02/2020   K 3.9 10/02/2020   CL 106 10/02/2020   CO2 28 10/02/2020   Lab Results  Component Value Date   ALT 22 10/02/2020   AST 16 10/02/2020   ALKPHOS 75 10/02/2020   BILITOT 0.8 10/02/2020   Lab Results  Component Value Date   HGBA1C 5.6 07/11/2020   HGBA1C 5.5 09/04/2018   Lab Results  Component Value Date   INSULIN 25.7 (H) 07/11/2020   Lab Results  Component Value Date   TSH 0.285 (L) 07/11/2020   Lab Results  Component Value Date   CHOL 263 (H) 07/11/2020   HDL 45 07/11/2020   LDLCALC 170 (H) 07/11/2020   TRIG 255 (H) 07/11/2020   Lab Results  Component Value Date   WBC 9.6 10/02/2020   HGB 13.4 10/02/2020   HCT 41.6 10/02/2020   MCV 87.6 10/02/2020   PLT 393 10/02/2020   No results found for: IRON, TIBC, FERRITIN  Attestation Statements:   Reviewed by clinician on day of visit: allergies, medications, problem list, medical history, surgical history, family history, social history, and previous encounter notes.  Time spent on visit including pre-visit chart review and post-visit charting and care was 27 minutes.   I, Sierra Gray, am acting as Location manager for [employer], NP-C   I have reviewed the above documentation for accuracy and completeness, and I agree with the above. -  Sierra Carre d.[place], NP-C

## 2020-10-16 ENCOUNTER — Telehealth (INDEPENDENT_AMBULATORY_CARE_PROVIDER_SITE_OTHER): Payer: 59 | Admitting: Psychology

## 2020-10-16 DIAGNOSIS — F5089 Other specified eating disorder: Secondary | ICD-10-CM

## 2020-10-17 ENCOUNTER — Encounter (INDEPENDENT_AMBULATORY_CARE_PROVIDER_SITE_OTHER): Payer: Self-pay | Admitting: Adult Health

## 2020-10-17 ENCOUNTER — Other Ambulatory Visit: Payer: Self-pay

## 2020-10-17 ENCOUNTER — Ambulatory Visit (INDEPENDENT_AMBULATORY_CARE_PROVIDER_SITE_OTHER): Payer: 59 | Admitting: Adult Health

## 2020-10-17 VITALS — BP 124/84 | HR 87 | Temp 98.2°F | Ht 64.0 in | Wt 200.0 lb

## 2020-10-17 DIAGNOSIS — E782 Mixed hyperlipidemia: Secondary | ICD-10-CM | POA: Diagnosis not present

## 2020-10-17 DIAGNOSIS — I10 Essential (primary) hypertension: Secondary | ICD-10-CM | POA: Diagnosis not present

## 2020-10-17 DIAGNOSIS — Z6834 Body mass index (BMI) 34.0-34.9, adult: Secondary | ICD-10-CM

## 2020-10-17 DIAGNOSIS — E669 Obesity, unspecified: Secondary | ICD-10-CM | POA: Diagnosis not present

## 2020-10-17 NOTE — Progress Notes (Signed)
Chief Complaint:   OBESITY Sierra Gray is here to discuss her progress with her obesity treatment plan along with follow-up of her obesity related diagnoses. Sierra Gray is on the Category 2 Plan and states she is following her eating plan approximately 50% of the time. Sierra Gray states she is exercising 0 minutes 0 times per week.  Today's visit was #: 7 Starting weight: 212 lbs Starting date: 07/11/2020 Today's weight: 200 lbs Today's date: 10/17/2020 Total lbs lost to date: 12 Total lbs lost since last in-office visit: 0  Interim History: Sierra Gray reports increased sweet cravings, i.e., chocolate. She has been challenged to consume lunch due to lack of appetite. She is boring with foods on the Category 2 meal plan and would like to discuss other eating plans. She remains active with home renovation/restoration.  Subjective:   Essential hypertension. Blood pressure and heart rate are excellent on today's office visit.  Sierra Gray is on amlodipine 5 mg daily and denies cardiac symptoms.  BP Readings from Last 3 Encounters:  10/17/20 124/84  10/03/20 119/76  10/02/20 137/72   Lab Results  Component Value Date   CREATININE 0.92 10/02/2020   CREATININE 1.01 (H) 06/19/2020   CREATININE 1.01 (H) 12/21/2019   Mixed hyperlipidemia. Lipid panel on 07/11/2020 showed total cholesterol, triglycerides, and LDL all above goal.   Lab Results  Component Value Date   CHOL 263 (H) 07/11/2020   HDL 45 07/11/2020   LDLCALC 170 (H) 07/11/2020   TRIG 255 (H) 07/11/2020   Lab Results  Component Value Date   ALT 22 10/02/2020   AST 16 10/02/2020   ALKPHOS 75 10/02/2020   BILITOT 0.8 10/02/2020   The 10-year ASCVD risk score Sierra Gray DC Jr., et al., 2013) is: 5%   Values used to calculate the score:     Age: 59 years     Sex: Female     Is Non-Hispanic African American: No     Diabetic: No     Tobacco smoker: No     Systolic Blood Pressure: 025 mmHg     Is BP treated: Yes     HDL Cholesterol:  45 mg/dL     Total Cholesterol: 263 mg/dL  Assessment/Plan:   Essential hypertension. Sierra Gray is working on healthy weight loss and exercise to improve blood pressure control. We will watch for signs of hypotension as she continues her lifestyle modifications. She will continue her calcium channel blocker as directed. Labs will be checked at the time of her next office visit.  Mixed hyperlipidemia. Cardiovascular risk and specific lipid/LDL goals reviewed.  We discussed several lifestyle modifications today and Sierra Gray will continue to work on diet, exercise and weight loss efforts. Orders and follow up as documented in patient record. Labs will be checked at her next office visit.  Counseling Intensive lifestyle modifications are the first line treatment for this issue. . Dietary changes: Increase soluble fiber. Decrease simple carbohydrates. . Exercise changes: Moderate to vigorous-intensity aerobic activity 150 minutes per week if tolerated. . Lipid-lowering medications: see documented in medical record.  Class 1 obesity with serious comorbidity and body mass index (BMI) of 34.0 to 34.9 in adult, unspecified obesity type.  Sierra Gray is currently in the action stage of change. As such, her goal is to continue with weight loss efforts. She has agreed to keeping a food journal and adhering to recommended goals of 1300-1400 calories and 95 grams of protein daily.  Handouts were provided on [employer] and Multiple Recipes.  Exercise goals: Home renovation/restoration.  Behavioral modification strategies: increasing lean protein intake, increasing water intake, meal planning and cooking strategies, better snacking choices, planning for success and keeping a strict food journal.  Sierra Gray has agreed to follow-up with our clinic fasting in 3-4 weeks. She was informed of the importance of frequent follow-up visits to maximize her success with intensive lifestyle modifications for her multiple health  conditions.   Objective:   Blood pressure 124/84, pulse 87, temperature 98.2 F (36.8 C), height 5\' 4"  (1.626 m), weight 200 lb (90.7 kg), SpO2 99 %. Body mass index is 34.33 kg/m.  General: Cooperative, alert, well developed, in no acute distress. HEENT: Conjunctivae and lids unremarkable. Cardiovascular: Regular rhythm.  Lungs: Normal work of breathing. Neurologic: No focal deficits.   Lab Results  Component Value Date   CREATININE 0.92 10/02/2020   BUN 15 10/02/2020   NA 141 10/02/2020   K 3.9 10/02/2020   CL 106 10/02/2020   CO2 28 10/02/2020   Lab Results  Component Value Date   ALT 22 10/02/2020   AST 16 10/02/2020   ALKPHOS 75 10/02/2020   BILITOT 0.8 10/02/2020   Lab Results  Component Value Date   HGBA1C 5.6 07/11/2020   HGBA1C 5.5 09/04/2018   Lab Results  Component Value Date   INSULIN 25.7 (H) 07/11/2020   Lab Results  Component Value Date   TSH 0.285 (L) 07/11/2020   Lab Results  Component Value Date   CHOL 263 (H) 07/11/2020   HDL 45 07/11/2020   LDLCALC 170 (H) 07/11/2020   TRIG 255 (H) 07/11/2020   Lab Results  Component Value Date   WBC 9.6 10/02/2020   HGB 13.4 10/02/2020   HCT 41.6 10/02/2020   MCV 87.6 10/02/2020   PLT 393 10/02/2020   No results found for: IRON, TIBC, FERRITIN  Attestation Statements:   Reviewed by clinician on day of visit: allergies, medications, problem list, medical history, surgical history, family history, social history, and previous encounter notes.  Time spent on visit including pre-visit chart review and post-visit charting and care was 27 minutes.   I, Michaelene Song, am acting as Location manager for [employer], NP-C   I have reviewed the above documentation for accuracy and completeness, and I agree with the above. -  Leondra Cullin d. Sevyn Markham, NP-C

## 2020-11-09 ENCOUNTER — Encounter (INDEPENDENT_AMBULATORY_CARE_PROVIDER_SITE_OTHER): Payer: Self-pay | Admitting: Adult Health

## 2020-11-09 ENCOUNTER — Ambulatory Visit (INDEPENDENT_AMBULATORY_CARE_PROVIDER_SITE_OTHER): Payer: 59 | Admitting: Adult Health

## 2020-11-09 ENCOUNTER — Other Ambulatory Visit: Payer: Self-pay

## 2020-11-09 VITALS — BP 131/82 | HR 98 | Temp 98.2°F | Ht 64.0 in | Wt 195.0 lb

## 2020-11-09 DIAGNOSIS — I10 Essential (primary) hypertension: Secondary | ICD-10-CM | POA: Diagnosis not present

## 2020-11-09 DIAGNOSIS — E8881 Metabolic syndrome: Secondary | ICD-10-CM

## 2020-11-09 DIAGNOSIS — Z9189 Other specified personal risk factors, not elsewhere classified: Secondary | ICD-10-CM | POA: Diagnosis not present

## 2020-11-09 DIAGNOSIS — E782 Mixed hyperlipidemia: Secondary | ICD-10-CM | POA: Diagnosis not present

## 2020-11-09 DIAGNOSIS — E559 Vitamin D deficiency, unspecified: Secondary | ICD-10-CM

## 2020-11-09 DIAGNOSIS — E669 Obesity, unspecified: Secondary | ICD-10-CM

## 2020-11-09 DIAGNOSIS — Z6833 Body mass index (BMI) 33.0-33.9, adult: Secondary | ICD-10-CM

## 2020-11-09 NOTE — Progress Notes (Signed)
Chief Complaint:   Sierra Gray is here to discuss her progress with her Sierra treatment plan along with follow-up of her Sierra related diagnoses. Sierra Gray is practicing portion control and making smarter food choices, such as increasing vegetables and decreasing simple carbohydrates and states she is following her eating plan approximately 80% of the time. Sierra Gray states she is exercising 0 minutes 0 times per week.  Today's visit was #: 8 Starting weight: 212 lbs Starting date: 07/11/2020 Today's weight: 195 lbs Today's date: 11/09/2020 Total lbs lost to date: 17 Total lbs lost since last in-office visit: 5  Interim History: Sierra Gray traveled to [place] to celebrate the Thanksgiving holiday. She focused on small portion sizes for her Thanksgiving meal. She continues to reduce portion sizes of all meals - fantastic!  Subjective:   Mixed hyperlipidemia. Lipid panel on 07/11/2020 showed all levels above goal. Sierra Gray is not on statin therapy.   Lab Results  Component Value Date   CHOL 263 (H) 07/11/2020   HDL 45 07/11/2020   LDLCALC 170 (H) 07/11/2020   TRIG 255 (H) 07/11/2020   Lab Results  Component Value Date   ALT 22 10/02/2020   AST 16 10/02/2020   ALKPHOS 75 10/02/2020   BILITOT 0.8 10/02/2020   The 10-year ASCVD risk score Sierra Gray., et al., 2013) is: 5.6%   Values used to calculate the score:     Age: 59 years     Sex: Female     Is Non-Hispanic African American: No     Diabetic: No     Tobacco smoker: No     Systolic Blood Pressure: 195 mmHg     Is BP treated: Yes     HDL Cholesterol: 45 mg/dL     Total Cholesterol: 263 mg/dL  Insulin resistance. Sierra Gray has a diagnosis of insulin resistance based on her elevated fasting insulin level >5. She continues to work on diet and exercise to decrease her risk of diabetes. Insulin level on 07/11/2020 was elevated with normal blood glucose and A1c.  Lab Results  Component Value Date   INSULIN 25.7 (H)  07/11/2020   Lab Results  Component Value Date   HGBA1C 5.6 07/11/2020   Essential hypertension. Blood pressure and heart rate are at goal on today's office visit. Sierra Gray is on amlodipine 5 mg daily. She denies lower extremity edema.  BP Readings from Last 3 Encounters:  11/09/20 131/82  10/17/20 124/84  10/03/20 119/76   Lab Results  Component Value Date   CREATININE 0.92 10/02/2020   CREATININE 1.01 (H) 06/19/2020   CREATININE 1.01 (H) 12/21/2019   Vitamin D deficiency. Last Vitamin D level was 40.8 on 07/11/2020, below goal of 50. Sierra Gray is on OTC Vitamin D3 5,000 IU daily.   Ref. Range 07/11/2020 13:50  Vitamin D, 25-Hydroxy Latest Ref Range: 30.0 - 100.0 ng/mL 40.8   At risk for heart disease. Sierra Gray is at a higher than average risk for cardiovascular disease due to hyperlipidemia and Sierra.   Assessment/Plan:   Mixed hyperlipidemia. Cardiovascular risk and specific lipid/LDL goals reviewed.  We discussed several lifestyle modifications today and [name] will continue to work on diet, exercise and weight loss efforts. Orders and follow up as documented in patient record. Labs will be checked today.   Counseling Intensive lifestyle modifications are the first line treatment for this issue. . Dietary changes: Increase soluble fiber. Decrease simple carbohydrates. . Exercise changes: Moderate to vigorous-intensity aerobic activity 150 minutes per week  if tolerated.  . Lipid-lowering medications: see documented in medical record.   Insulin resistance. Sierra Gray will continue to work on weight loss, exercise, and decreasing simple carbohydrates to help decrease the risk of diabetes. Sierra Gray agreed to follow-up with [name] as directed to closely monitor her progress. Labs will be checked today.   Essential hypertension. Sierra Gray is working on healthy weight loss and exercise to improve blood pressure control. We will watch for signs of hypotension as she continues her lifestyle modifications. Labs  will be checked today.   Vitamin D deficiency. Low Vitamin D level contributes to fatigue and are associated with Sierra, breast, and colon cancer. VITAMIN D 25 Hydroxy (Vit-D Deficiency, Fractures) level will be checked today.   At risk for heart disease. Sierra Gray was given approximately 15 minutes of coronary artery disease prevention counseling today. She is 59 y.o. female and has risk factors for heart disease including Sierra. We discussed intensive lifestyle modifications today with an emphasis on specific weight loss instructions and strategies.   Repetitive spaced learning was employed today to elicit superior memory formation and behavioral change.  Class 1 Sierra with serious comorbidity and body mass index (BMI) of 33.0 to 33.9 in adult, unspecified Sierra type.  Sierra Gray is currently in the action stage of change. As such, her goal is to continue with weight loss efforts. She has agreed to practicing portion control and making smarter food choices, such as increasing vegetables and decreasing simple carbohydrates.   Exercise goals: No exercise has been prescribed at this time.  Behavioral modification strategies: increasing lean protein intake, increasing water intake, meal planning and cooking strategies and planning for success.  Sierra Gray has agreed to follow-up with our clinic in 2 weeks. She was informed of the importance of frequent follow-up visits to maximize her success with intensive lifestyle modifications for her multiple health conditions.   Sierra Gray was informed we would discuss her lab results at her next visit unless there is a critical issue that needs to be addressed sooner. Sierra Gray agreed to keep her next visit at the agreed upon time to discuss these results.  Objective:   Blood pressure 131/82, pulse 98, temperature 98.2 F (36.8 C), height 5\' 4"  (1.626 m), weight 195 lb (88.5 kg), SpO2 98 %. Body mass index is 33.47 kg/m.  General: Cooperative, alert, well developed,  in no acute distress. HEENT: Conjunctivae and lids unremarkable. Cardiovascular: Regular rhythm.  Lungs: Normal work of breathing. Neurologic: No focal deficits.   Lab Results  Component Value Date   CREATININE 0.92 10/02/2020   BUN 15 10/02/2020   NA 141 10/02/2020   K 3.9 10/02/2020   CL 106 10/02/2020   CO2 28 10/02/2020   Lab Results  Component Value Date   ALT 22 10/02/2020   AST 16 10/02/2020   ALKPHOS 75 10/02/2020   BILITOT 0.8 10/02/2020   Lab Results  Component Value Date   HGBA1C 5.6 07/11/2020   HGBA1C 5.5 09/04/2018   Lab Results  Component Value Date   INSULIN 25.7 (H) 07/11/2020   Lab Results  Component Value Date   TSH 0.285 (L) 07/11/2020   Lab Results  Component Value Date   CHOL 263 (H) 07/11/2020   HDL 45 07/11/2020   LDLCALC 170 (H) 07/11/2020   TRIG 255 (H) 07/11/2020   Lab Results  Component Value Date   WBC 9.6 10/02/2020   HGB 13.4 10/02/2020   HCT 41.6 10/02/2020   MCV 87.6 10/02/2020   PLT 393 10/02/2020  No results found for: IRON, TIBC, FERRITIN  Attestation Statements:   Reviewed by clinician on day of visit: allergies, medications, problem list, medical history, surgical history, family history, social history, and previous encounter notes.  I, Michaelene Song, am acting as Location manager for [employer], NP-C   I have reviewed the above documentation for accuracy and completeness, and I agree with the above. -  Jaeceon Michelin d. Shanley Furlough, NP-C

## 2020-11-10 LAB — COMPREHENSIVE METABOLIC PANEL
ALT: 22 IU/L (ref 0–32)
AST: 17 IU/L (ref 0–40)
Albumin/Globulin Ratio: 2.1 (ref 1.2–2.2)
Albumin: 4.8 g/dL (ref 3.8–4.9)
Alkaline Phosphatase: 83 IU/L (ref 44–121)
BUN/Creatinine Ratio: 16 (ref 9–23)
BUN: 15 mg/dL (ref 6–24)
Bilirubin Total: 0.5 mg/dL (ref 0.0–1.2)
CO2: 24 mmol/L (ref 20–29)
Calcium: 10.4 mg/dL — ABNORMAL HIGH (ref 8.7–10.2)
Chloride: 104 mmol/L (ref 96–106)
Creatinine, Ser: 0.94 mg/dL (ref 0.57–1.00)
GFR calc Af Amer: 77 mL/min/{1.73_m2} (ref >59)
GFR calc non Af Amer: 67 mL/min/{1.73_m2} (ref >59)
Globulin, Total: 2.3 g/dL (ref 1.5–4.5)
Glucose: 96 mg/dL (ref 65–99)
Potassium: 4.8 mmol/L (ref 3.5–5.2)
Sodium: 144 mmol/L (ref 134–144)
Total Protein: 7.1 g/dL (ref 6.0–8.5)

## 2020-11-10 LAB — VITAMIN D 25 HYDROXY (VIT D DEFICIENCY, FRACTURES): Vit D, 25-Hydroxy: 46 ng/mL (ref 30.0–100.0)

## 2020-11-10 LAB — LIPID PANEL
Chol/HDL Ratio: 5.8 ratio — ABNORMAL HIGH (ref 0.0–4.4)
Cholesterol, Total: 238 mg/dL — ABNORMAL HIGH (ref 100–199)
HDL: 41 mg/dL (ref >39)
LDL Chol Calc (NIH): 167 mg/dL — ABNORMAL HIGH (ref 0–99)
Triglycerides: 161 mg/dL — ABNORMAL HIGH (ref 0–149)
VLDL Cholesterol Cal: 30 mg/dL (ref 5–40)

## 2020-11-10 LAB — HEMOGLOBIN A1C
Est. average glucose Bld gHb Est-mCnc: 114 mg/dL
Hgb A1c MFr Bld: 5.6 % (ref 4.8–5.6)

## 2020-11-10 LAB — INSULIN, RANDOM: INSULIN: 23.9 u[IU]/mL (ref 2.6–24.9)

## 2020-11-22 ENCOUNTER — Other Ambulatory Visit: Payer: Self-pay

## 2020-11-22 ENCOUNTER — Ambulatory Visit (INDEPENDENT_AMBULATORY_CARE_PROVIDER_SITE_OTHER): Payer: 59 | Admitting: Adult Health

## 2020-11-22 ENCOUNTER — Encounter (INDEPENDENT_AMBULATORY_CARE_PROVIDER_SITE_OTHER): Payer: Self-pay | Admitting: Adult Health

## 2020-11-22 VITALS — BP 118/77 | HR 74 | Temp 98.0°F | Ht 64.0 in | Wt 197.0 lb

## 2020-11-22 DIAGNOSIS — I10 Essential (primary) hypertension: Secondary | ICD-10-CM

## 2020-11-22 DIAGNOSIS — E669 Obesity, unspecified: Secondary | ICD-10-CM

## 2020-11-22 DIAGNOSIS — E8881 Metabolic syndrome: Secondary | ICD-10-CM

## 2020-11-22 DIAGNOSIS — E559 Vitamin D deficiency, unspecified: Secondary | ICD-10-CM

## 2020-11-22 DIAGNOSIS — E782 Mixed hyperlipidemia: Secondary | ICD-10-CM

## 2020-11-22 DIAGNOSIS — Z6834 Body mass index (BMI) 34.0-34.9, adult: Secondary | ICD-10-CM

## 2020-11-27 NOTE — Progress Notes (Signed)
Chief Complaint:   OBESITY Sierra Gray is here to discuss her progress with her obesity treatment plan along with follow-up of her obesity related diagnoses. Sierra Gray is practicing portion control and making smarter food choices, such as increasing vegetables and decreasing simple carbohydrates and states she is following her eating plan approximately 50% of the time. Sierra Gray states she is working around the house for exercise.   Today's visit was #: 9 Starting weight: 212 lbs Starting date: 07/11/2020 Today's weight: 197 lbs Today's date: 11/22/2020 Total lbs lost to date: 15 Total lbs lost since last in-office visit: 0  Interim History: Sierra Gray continues to remain active with house restoration and daily walking. She will go to [place] for 1-2 weeks for the Christmas holiday.  Subjective:   Essential hypertension. Blood pressure and heart rate are stable on today's office visit. Sierra Gray is on amlodipine 5 mg daily. CMP was stable on 11/09/2020.  Mixed hyperlipidemia. Lipid panel on 11/09/2020 showed improved total cholesterol, triglycerides, and LDL. ASCVD risk was 4.5%. [name] denies family history of MI/CVA.   Lab Results  Component Value Date   CHOL 238 (H) 11/09/2020   HDL 41 11/09/2020   LDLCALC 167 (H) 11/09/2020   TRIG 161 (H) 11/09/2020   CHOLHDL 5.8 (H) 11/09/2020   Lab Results  Component Value Date   ALT 22 11/09/2020   AST 17 11/09/2020   ALKPHOS 83 11/09/2020   BILITOT 0.5 11/09/2020   The 10-year ASCVD risk score [name]., et al., 2013) is: 4.5%   Values used to calculate the score:     Age: 89 years     Sex: Female     Is Non-Hispanic African American: No     Diabetic: No     Tobacco smoker: No     Systolic Blood Pressure: 259 mmHg     Is BP treated: Yes     HDL Cholesterol: 41 mg/dL     Total Cholesterol: 238 mg/dL  Vitamin D deficiency. Vitamin D level on 11/09/2020 was 46.0, just below goal of 50. Sierra Gray is taking OTC Vitamin D3 3,000 IU daily  and OTC multivitamin (1,000 IU Vitamin D) 2 daily.   Ref. Range 11/09/2020 10:05  Vitamin D, 25-Hydroxy Latest Ref Range: 30.0 - 100.0 ng/mL 46.0   Insulin resistance. Sierra Gray has a diagnosis of insulin resistance based on her elevated fasting insulin level >5. She continues to work on diet and exercise to decrease her risk of diabetes. Insulin level was elevated at 23.9 on 11/09/2020 with normal blood glucose and A1c levels.  Lab Results  Component Value Date   INSULIN 23.9 11/09/2020   INSULIN 25.7 (H) 07/11/2020   Lab Results  Component Value Date   HGBA1C 5.6 11/09/2020   Assessment/Plan:   Essential hypertension. [name] is working on healthy weight loss and exercise to improve blood pressure control. We will watch for signs of hypotension as she continues her lifestyle modifications. She will continue her calcium channel blocker as directed and daily walking.  Mixed hyperlipidemia. Cardiovascular risk and specific lipid/LDL goals reviewed.  We discussed several lifestyle modifications today and [name] will continue to work on diet, exercise and weight loss efforts. Orders and follow up as documented in patient record. She will continue to decrease saturated fats and increase daily walking. Labs will be checked every 3 months.  Counseling Intensive lifestyle modifications are the first line treatment for this issue. . Dietary changes: Increase soluble fiber. Decrease simple carbohydrates. . Exercise  changes: Moderate to vigorous-intensity aerobic activity 150 minutes per week if tolerated. . Lipid-lowering medications: see documented in medical record.  Vitamin D deficiency. Low Vitamin D level contributes to fatigue and are associated with obesity, breast, and colon cancer. She agrees to increase OTC Vitamin D3 2 x 2,000 IU daily for a total of 4,000 IU daily and will follow-up for routine testing of Vitamin D, at least 2-3 times per year to avoid over-replacement.  Insulin resistance.  Sierra Gray will continue to work on weight loss, exercise, increasing protein, and decreasing simple carbohydrates to help decrease the risk of diabetes. Sierra Gray agreed to follow-up with [name] as directed to closely monitor her progress.  Class 1 obesity with serious comorbidity and body mass index (BMI) of 34.0 to 34.9 in adult, unspecified obesity type.  Sierra Gray is currently in the action stage of change. As such, her goal is to continue with weight loss efforts. She has agreed to practicing portion control and making smarter food choices, such as increasing vegetables and decreasing simple carbohydrates.   Exercise goals: Sierra Gray will continue working around the house for exercise.  Behavioral modification strategies: increasing lean protein intake, decreasing simple carbohydrates, meal planning and cooking strategies, holiday eating strategies  and planning for success.  Sierra Gray has agreed to follow-up with our clinic in 3 weeks. She was informed of the importance of frequent follow-up visits to maximize her success with intensive lifestyle modifications for her multiple health conditions.   Objective:   Blood pressure 118/77, pulse 74, temperature 98 F (36.7 C), height 5\' 4"  (1.626 m), weight 197 lb (89.4 kg), SpO2 97 %. Body mass index is 33.81 kg/m.  General: Cooperative, alert, well developed, in no acute distress. HEENT: Conjunctivae and lids unremarkable. Cardiovascular: Regular rhythm.  Lungs: Normal work of breathing. Neurologic: No focal deficits.   Lab Results  Component Value Date   CREATININE 0.94 11/09/2020   BUN 15 11/09/2020   NA 144 11/09/2020   K 4.8 11/09/2020   CL 104 11/09/2020   CO2 24 11/09/2020   Lab Results  Component Value Date   ALT 22 11/09/2020   AST 17 11/09/2020   ALKPHOS 83 11/09/2020   BILITOT 0.5 11/09/2020   Lab Results  Component Value Date   HGBA1C 5.6 11/09/2020   HGBA1C 5.6 07/11/2020   HGBA1C 5.5 09/04/2018   Lab Results  Component Value  Date   INSULIN 23.9 11/09/2020   INSULIN 25.7 (H) 07/11/2020   Lab Results  Component Value Date   TSH 0.285 (L) 07/11/2020   Lab Results  Component Value Date   CHOL 238 (H) 11/09/2020   HDL 41 11/09/2020   LDLCALC 167 (H) 11/09/2020   TRIG 161 (H) 11/09/2020   CHOLHDL 5.8 (H) 11/09/2020   Lab Results  Component Value Date   WBC 9.6 10/02/2020   HGB 13.4 10/02/2020   HCT 41.6 10/02/2020   MCV 87.6 10/02/2020   PLT 393 10/02/2020   No results found for: IRON, TIBC, FERRITIN  Attestation Statements:   Reviewed by clinician on day of visit: allergies, medications, problem list, medical history, surgical history, family history, social history, and previous encounter notes.  Time spent on visit including pre-visit chart review and post-visit charting and care was 35 minutes.   I, Michaelene Song, am acting as Location manager for [employer], NP-C   I have reviewed the above documentation for accuracy and completeness, and I agree with the above. -  Shalia Bartko d. Luxe Cuadros, NP-C

## 2020-12-19 ENCOUNTER — Other Ambulatory Visit: Payer: Self-pay

## 2020-12-19 ENCOUNTER — Inpatient Hospital Stay: Payer: 59

## 2020-12-19 ENCOUNTER — Encounter: Payer: Self-pay | Admitting: Hematology and Oncology

## 2020-12-19 ENCOUNTER — Inpatient Hospital Stay: Payer: 59 | Attending: Hematology and Oncology | Admitting: Hematology and Oncology

## 2020-12-19 DIAGNOSIS — Z6834 Body mass index (BMI) 34.0-34.9, adult: Secondary | ICD-10-CM | POA: Insufficient documentation

## 2020-12-19 DIAGNOSIS — Z79899 Other long term (current) drug therapy: Secondary | ICD-10-CM | POA: Diagnosis not present

## 2020-12-19 DIAGNOSIS — E669 Obesity, unspecified: Secondary | ICD-10-CM | POA: Insufficient documentation

## 2020-12-19 DIAGNOSIS — C562 Malignant neoplasm of left ovary: Secondary | ICD-10-CM | POA: Diagnosis present

## 2020-12-19 DIAGNOSIS — Z1509 Genetic susceptibility to other malignant neoplasm: Secondary | ICD-10-CM

## 2020-12-19 DIAGNOSIS — Z1501 Genetic susceptibility to malignant neoplasm of breast: Secondary | ICD-10-CM

## 2020-12-19 DIAGNOSIS — Z1589 Genetic susceptibility to other disease: Secondary | ICD-10-CM

## 2020-12-19 LAB — CBC WITH DIFFERENTIAL/PLATELET
Abs Immature Granulocytes: 0.03 10*3/uL (ref 0.00–0.07)
Basophils Absolute: 0.1 10*3/uL (ref 0.0–0.1)
Basophils Relative: 1 %
Eosinophils Absolute: 0.3 10*3/uL (ref 0.0–0.5)
Eosinophils Relative: 4 %
HCT: 42.1 % (ref 36.0–46.0)
Hemoglobin: 13.7 g/dL (ref 12.0–15.0)
Immature Granulocytes: 0 %
Lymphocytes Relative: 22 %
Lymphs Abs: 1.7 10*3/uL (ref 0.7–4.0)
MCH: 28.1 pg (ref 26.0–34.0)
MCHC: 32.5 g/dL (ref 30.0–36.0)
MCV: 86.3 fL (ref 80.0–100.0)
Monocytes Absolute: 0.6 10*3/uL (ref 0.1–1.0)
Monocytes Relative: 8 %
Neutro Abs: 4.9 10*3/uL (ref 1.7–7.7)
Neutrophils Relative %: 65 %
Platelets: 358 10*3/uL (ref 150–400)
RBC: 4.88 MIL/uL (ref 3.87–5.11)
RDW: 14.3 % (ref 11.5–15.5)
WBC: 7.6 10*3/uL (ref 4.0–10.5)
nRBC: 0 % (ref 0.0–0.2)

## 2020-12-19 LAB — COMPREHENSIVE METABOLIC PANEL
ALT: 17 U/L (ref 0–44)
AST: 15 U/L (ref 15–41)
Albumin: 4 g/dL (ref 3.5–5.0)
Alkaline Phosphatase: 72 U/L (ref 38–126)
Anion gap: 10 (ref 5–15)
BUN: 19 mg/dL (ref 6–20)
CO2: 24 mmol/L (ref 22–32)
Calcium: 10.3 mg/dL (ref 8.9–10.3)
Chloride: 107 mmol/L (ref 98–111)
Creatinine, Ser: 0.89 mg/dL (ref 0.44–1.00)
GFR, Estimated: >60 mL/min (ref >60)
Glucose, Bld: 109 mg/dL — ABNORMAL HIGH (ref 70–99)
Potassium: 4 mmol/L (ref 3.5–5.1)
Sodium: 141 mmol/L (ref 135–145)
Total Bilirubin: 0.6 mg/dL (ref 0.3–1.2)
Total Protein: 7.4 g/dL (ref 6.5–8.1)

## 2020-12-19 NOTE — Assessment & Plan Note (Signed)
She has completed adjuvant treatment She have no signs or symptoms to suggest cancer recurrence Tumor marker is pending, will call her with results She will continue alternate appointment between GYN surgeon and myself every few months I will see her in 6 months for further follow-up 

## 2020-12-19 NOTE — Assessment & Plan Note (Signed)
I have reviewed her recent MRI breast She is due for screening mammogram next month in February After I see her in July, I will order MRI of the breast We will continue alternate screening modality Unfortunately, there is no good screening to to look for pancreatic cancer The patient is educated to watch out for signs and symptoms of abdominal pain, changes in bowel habits, or nausea

## 2020-12-19 NOTE — Progress Notes (Signed)
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 recent MRI breast She is due for screening mammogram next month in February After I see her in July, I will order MRI of the breast We will continue alternate screening modality Unfortunately, there is no good screening to to look for pancreatic cancer The patient is educated to watch out for signs and symptoms of abdominal pain, changes in bowel habits, or nausea  Class 1 obesity with serious comorbidity and body mass index (BMI) of 34.0 to 34.9 in adult She has successfully lost some weight since last time I saw her She will continue aggressive lifestyle changes   No orders of the defined types were placed in this encounter.   All questions were answered. The patient knows to call the clinic with any problems, questions or concerns. The total time spent in the appointment was 20 minutes encounter with patients including review of chart and various tests results, discussions about plan of care and coordination of care plan   Artis Delay, MD 12/19/2020 10:56 AM  INTERVAL HISTORY: Please see below for problem oriented charting. She returns for further follow-up She is doing well She has lost some weight Denies abdominal pain, bloating, or changes in bowel habits She denies any recent abnormal breast examination, palpable mass, abnormal breast appearance or nipple changes   SUMMARY OF ONCOLOGIC HISTORY: Oncology History Overview Note  Genetic testing came back  positive for ATM variant   Left ovarian epithelial cancer (HCC)  08/19/2017 Imaging   She had outside CT scan which showed large abdominal mass   08/27/2017 Tumor Marker   Patient's tumor was tested for the following markers: CA-125 Results of the tumor marker test revealed 904.5   09/11/2017 Pathology Results   1. 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 Soft tissue, biopsy, right medial thigh MATURE LIPOMA 3. Ovary and fallopian tube, right ENDOMETRIOMA AND SIMPLE SEROUS CYST WITH FALLOPIAN TUBE ADHESION NEGATIVE FOR MALIGNANCY 4. Omentum, resection for tumor BENIGN OMENTUM NEGATIVE FOR CARCINOMA 5. Lymph nodes, regional resection, right pelvic EIGHT BENIGN LYMPH NODES (0/8) 6. Lymph nodes, regional resection, left pelvic SEVEN BENIGN LYMPH NODES (0/7) 7. Lymph node, biopsy, right para-aortic FOUR BENIGN LYMPH NODES (0/4) 8. Lymph node, biopsy, left para-aortic ONE BENIGN LYMPH NODE (0/1) 9. Peritoneum, biopsy, left abdominal BENIGN FIBROMUSCULAR TISSUE 10. Peritoneum, biopsy, right abdominal BENIGN FIBROMUSCULAR TISSUE WITH SEROSITIS 11. Peritoneum, biopsy, pelvic FIBROADIPOSE TISSUE WITH SEROSITIS  Specimen(s): Ovary and fallopian tube Procedure: (including lymph node sampling): salpingo-oophorectomy Primary tumor site (including laterality): Left ovary Ovarian surface involvement: Yes Ovarian capsule intact without fragmentation: intact Maximum tumor size (cm): 20.0 cm Histologic type: Endometrioid carcinoma Grade: 1 Peritoneal implants: (specify invasive or non-invasive): Negative Pelvic extension (list additional structures on separate lines and if involved): Negative Lymph nodes: number examined 20 ; number positive 0 TNM code: pT1c2, pN0, pMx FIGO Stage (based on  pathologic  findings, needs clinical correlation): IC2   09/11/2017 Surgery   Preoperative Diagnosis: 1. left adnexal mass.  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   Procedure(s) Performed: 1. Exploratory laparotomy with bilateral salpingo-oophorectomy, pelvic and para-aortic lymph node dissection, omentectomy and radical debulking for ovarian cancer (CPT [phone])  Surgeon: Everitt Amber, M.D. Operative Findings:20 cm left ovarian mass.   No intraperitoneal rupture occurred;  Frozen pathology was consistent with adenocarcinoma; right tube and ovary normal in appearance; 3) normal bilateral pelvic and para-aortic lymph nodes and omentum; small and large bowel to palpation. This represented an optimal cytoreduction with no gross visible disease remaining.    10/09/2017 Tumor Marker   Patient's tumor was tested for the following markers: CA-125 Results of the tumor marker test revealed 68.7   10/15/2017 Adverse Reaction   She developed reaction to Paclitaxel.   10/15/2017 - 01/29/2018 Chemotherapy   She received carboplatin. Due to infusion reaction to Taxol with cycle 1, treatment was switched to carboplatin and Abraxane   11/03/2017 Procedure   Successful placement of a right internal jugular approach power injectable Port-A-Cath. The catheter is ready for immediate use.   11/27/2017 Tumor Marker   Patient's tumor was tested for the following markers: CA-125 Results of the tumor marker test revealed 27.7   12/12/2017 Genetic Testing   The patient had genetic testing due to a personal history of ovarian cancer and a family history of pancreatic cancer (and possibly breast cancer).  The [facility] Hereditary Cancer Panel + tumor HRD analysis was ordered from the laboratory [facility]. The [facility] gene panel offered by [employer] includes sequencing and deletion/duplication testing of the following 29 genes: APC, ATM, BARD1, BMPR1A, BRCA1, BRCA2, BRIP1,  CHD1, CDK4, CDKN2A, CHEK2, EPCAM (large rearrangement only), HOXB13, MLH1, MSH2, MSH6, MUTYH, NBN, PALB2, PMS2, PTEN, RAD51C, RAD51D, SMAD4, STK11, and TP53. Sequencing was performed for select regions of POLE and POLD1, and large rearrangement analysis was performed for select regions of GREM1.  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 [id number] (p.[id number]).  The date of this test report is 12/12/2017.   01/08/2018 Tumor Marker   Patient's tumor was tested for the following markers: CA-125 Results of the tumor marker test revealed 17.5   03/02/2018 Tumor Marker   Patient's tumor was tested for the following markers: CA-125 Results of the tumor marker test revealed 11.4   03/14/2018 Imaging   MRCP 1. No worrisome pancreatic lesions are currently present. 2. Cholelithiasis. 3. Small benign-appearing bilateral renal cysts. 4. Midline anterior abdominal wall laxity seen on coronal images, potential small hernia in this vicinity without observed complication   06/13/1948 Procedure   Successful right IJ vein Port-A-Cath explant.   12/21/2019 Tumor Marker   Patient's tumor was tested for the following markers: CA-125 Results of the tumor marker test revealed 9.5.   06/19/2020 Tumor Marker   Patient's tumor was tested for the following markers: CA-125 Results of the tumor marker test revealed 12.5   09/01/2020 Imaging   The BMD measured at Femur Neck Left is 0.866 g/cm2 with a T-score of -1.2. This patient is considered osteopenic according to [facility]) criteria     REVIEW OF SYSTEMS:   Constitutional: Denies fevers, chills or abnormal weight loss Eyes: Denies blurriness of vision Ears, nose, mouth, throat, and face: Denies mucositis or sore throat Respiratory: Denies cough, dyspnea or wheezes Cardiovascular: Denies palpitation, chest discomfort or lower extremity swelling Gastrointestinal:  Denies nausea,  heartburn or change in bowel  habits Skin: Denies abnormal skin rashes Lymphatics: Denies new lymphadenopathy or easy bruising Neurological:Denies numbness, tingling or new weaknesses Behavioral/Psych: Mood is stable, no new changes  All other systems were reviewed with the patient and are negative.  I have reviewed the past medical history, past surgical history, social history and family history with the patient and they are unchanged from previous note.  ALLERGIES:  is allergic to bee venom, chlorhexidine, paclitaxel, bacitracin, iodine, and other.  MEDICATIONS:  Current Outpatient Medications  Medication Sig Dispense Refill  . amLODipine (NORVASC) 5 MG tablet Take 1 tablet (5 mg total) by mouth daily. 90 tablet 0  . anastrozole (ARIMIDEX) 1 MG tablet TAKE 1 TABLET BY MOUTH EVERY DAY 90 tablet 8  . Apple Cider Vinegar 600 MG CAPS Take 1,200 mg by mouth 2 (two) times daily. Takes in morning and in evening.    . cetirizine (ZYRTEC) 10 MG tablet Take 10 mg by mouth daily.    . Cholecalciferol (VITAMIN D3) 125 MCG (5000 UT) CAPS Take 3,000 Units by mouth daily.     . Cinnamon 500 MG TABS Take 2,000 mg by mouth 2 (two) times daily.    Marland Kitchen ibuprofen (ADVIL,MOTRIN) 200 MG tablet Take 400 mg by mouth every 6 (six) hours as needed.    . Inulin (FIBER CHOICE PO) Take 10 g by mouth daily.    . Liniments (SALONPAS PAIN RELIEF PATCH EX) Place 1 patch onto the skin daily as needed (knee pain).    . Magnesium Gluconate 550 MG TABS Take 500 mg by mouth daily.     . Melatonin 5 MG TABS Take 5 mg by mouth daily.     . Multiple Vitamins-Minerals (HAIR SKIN & NAILS ADVANCED PO) Take 1 tablet by mouth daily.    . Turmeric 500 MG TABS Take 500 mg by mouth daily.     No current facility-administered medications for this visit.    PHYSICAL EXAMINATION: ECOG PERFORMANCE STATUS: 0 - Asymptomatic  Vitals:   12/19/20 1003  BP: 140/78  Pulse: 85  Resp: 18  Temp: 97.6 F (36.4 C)  SpO2: 96%   Filed Weights   12/19/20 1003   Weight: 201 lb 3.2 oz (91.3 kg)    GENERAL:alert, no distress and comfortable SKIN: skin color, texture, turgor are normal, no rashes or significant lesions EYES: normal, Conjunctiva are pink and non-injected, sclera clear OROPHARYNX:no exudate, no erythema and lips, buccal mucosa, and tongue normal  NECK: supple, thyroid normal size, non-tender, without nodularity LYMPH:  no palpable lymphadenopathy in the cervical, axillary or inguinal LUNGS: clear to auscultation and percussion with normal breathing effort HEART: regular rate & rhythm and no murmurs and no lower extremity edema ABDOMEN:abdomen soft, non-tender and normal bowel sounds. Noted abdominal wall hernia Musculoskeletal:no cyanosis of digits and no clubbing  NEURO: alert & oriented x 3 with fluent speech, no focal motor/sensory deficits  LABORATORY DATA:  I have reviewed the data as listed    Component Value Date/Time   NA 141 12/19/2020 0946   NA 144 11/09/2020 1005   NA 141 11/27/2017 0818   K 4.0 12/19/2020 0946   K 3.8 11/27/2017 0818   CL 107 12/19/2020 0946   CO2 24 12/19/2020 0946   CO2 25 11/27/2017 0818   GLUCOSE 109 (H) 12/19/2020 0946   GLUCOSE 90 11/27/2017 0818   BUN 19 12/19/2020 0946   BUN 15 11/09/2020 1005   BUN 12.9 11/27/2017 0818   CREATININE  0.89 12/19/2020 0946   CREATININE 0.8 11/27/2017 0818   CALCIUM 10.3 12/19/2020 0946   CALCIUM 9.9 11/27/2017 0818   PROT 7.4 12/19/2020 0946   PROT 7.1 11/09/2020 1005   PROT 7.0 11/27/2017 0818   ALBUMIN 4.0 12/19/2020 0946   ALBUMIN 4.8 11/09/2020 1005   ALBUMIN 3.8 11/27/2017 0818   AST 15 12/19/2020 0946   AST 11 11/27/2017 0818   ALT 17 12/19/2020 0946   ALT 11 11/27/2017 0818   ALKPHOS 72 12/19/2020 0946   ALKPHOS 68 11/27/2017 0818   BILITOT 0.6 12/19/2020 0946   BILITOT 0.5 11/09/2020 1005   BILITOT 0.41 11/27/2017 0818   GFRNONAA >60 12/19/2020 0946   GFRAA 77 11/09/2020 1005    No results found for: SPEP, UPEP  Lab Results   Component Value Date   WBC 7.6 12/19/2020   NEUTROABS 4.9 12/19/2020   HGB 13.7 12/19/2020   HCT 42.1 12/19/2020   MCV 86.3 12/19/2020   PLT 358 12/19/2020      Chemistry      Component Value Date/Time   NA 141 12/19/2020 0946   NA 144 11/09/2020 1005   NA 141 11/27/2017 0818   K 4.0 12/19/2020 0946   K 3.8 11/27/2017 0818   CL 107 12/19/2020 0946   CO2 24 12/19/2020 0946   CO2 25 11/27/2017 0818   BUN 19 12/19/2020 0946   BUN 15 11/09/2020 1005   BUN 12.9 11/27/2017 0818   CREATININE 0.89 12/19/2020 0946   CREATININE 0.8 11/27/2017 0818      Component Value Date/Time   CALCIUM 10.3 12/19/2020 0946   CALCIUM 9.9 11/27/2017 0818   ALKPHOS 72 12/19/2020 0946   ALKPHOS 68 11/27/2017 0818   AST 15 12/19/2020 0946   AST 11 11/27/2017 0818   ALT 17 12/19/2020 0946   ALT 11 11/27/2017 0818   BILITOT 0.6 12/19/2020 0946   BILITOT 0.5 11/09/2020 1005   BILITOT 0.41 11/27/2017 0818

## 2020-12-19 NOTE — Assessment & Plan Note (Signed)
She has successfully lost some weight since last time I saw her She will continue aggressive lifestyle changes

## 2020-12-20 ENCOUNTER — Other Ambulatory Visit: Payer: Self-pay

## 2020-12-20 ENCOUNTER — Ambulatory Visit (INDEPENDENT_AMBULATORY_CARE_PROVIDER_SITE_OTHER): Payer: 59 | Admitting: Physician Assistant

## 2020-12-20 ENCOUNTER — Telehealth: Payer: Self-pay

## 2020-12-20 ENCOUNTER — Encounter (INDEPENDENT_AMBULATORY_CARE_PROVIDER_SITE_OTHER): Payer: Self-pay | Admitting: Physician Assistant

## 2020-12-20 VITALS — BP 111/75 | HR 88 | Temp 98.6°F | Ht 64.0 in | Wt 199.0 lb

## 2020-12-20 DIAGNOSIS — E559 Vitamin D deficiency, unspecified: Secondary | ICD-10-CM

## 2020-12-20 DIAGNOSIS — Z6834 Body mass index (BMI) 34.0-34.9, adult: Secondary | ICD-10-CM | POA: Diagnosis not present

## 2020-12-20 DIAGNOSIS — I10 Essential (primary) hypertension: Secondary | ICD-10-CM

## 2020-12-20 DIAGNOSIS — E669 Obesity, unspecified: Secondary | ICD-10-CM | POA: Diagnosis not present

## 2020-12-20 DIAGNOSIS — E66811 Obesity, class 1: Secondary | ICD-10-CM

## 2020-12-20 LAB — CA 125: Cancer Antigen (CA) 125: 10.6 U/mL (ref 0.0–38.1)

## 2020-12-20 NOTE — Telephone Encounter (Signed)
RN notified patient of normal CA 125 results.  No further needs.

## 2020-12-20 NOTE — Telephone Encounter (Signed)
-----   Message from Heath Lark, MD sent at 12/20/2020 10:45 AM EST ----- Regarding: pls let her know CA-125 is normal

## 2020-12-21 NOTE — Progress Notes (Signed)
Chief Complaint:   OBESITY Sierra Gray is here to discuss her progress with her obesity treatment plan along with follow-up of her obesity related diagnoses. Sierra Gray is on practicing portion control and making smarter food choices, such as increasing vegetables and decreasing simple carbohydrates and states she is following her eating plan approximately 75% of the time. Sierra Gray states she is doing 0 minutes 0 times per week.  Today's visit was #: 10 Starting weight: 212 lbs Starting date: 07/11/2020 Today's weight: 199 lbs Today's date: 12/20/2020 Total lbs lost to date: 13 Total lbs lost since last in-office visit: 0  Interim History: Sierra Gray was at her mom's for 3 weeks and she just returned last week. She reports that she is a "2 meal a day person". She is not exercising. She is working on her house and is looking for a job. She has not yet started journaling consistently.   Subjective:   1. Essential hypertension Sierra Gray is on Norvasc, and her blood pressure is slightly elevated today. She denies chest pain or headache.  2. Vitamin D deficiency Sierra Gray is on Vit D 5,000 units daily, and she denies nausea, vomiting, or muscle weakness.  Assessment/Plan:   1. Essential hypertension Sierra Gray is working on healthy weight loss and exercise to improve blood pressure control. We will watch for signs of hypotension as she continues her lifestyle modifications. Primary care physician recommendations were given today. We will discuss exercise in the future after she sees Ortho for her knee. Sierra Gray will continue her medications and will monitor her blood pressure.  2. Vitamin D deficiency Low Vitamin D level contributes to fatigue and are associated with obesity, breast, and colon cancer. [name] agreed to continue taking Vitamin D and will follow-up for routine testing of Vitamin D, at least 2-3 times per year to avoid over-replacement.  3. Class 1 obesity with serious comorbidity and body mass index (BMI)  of 34.0 to 34.9 in adult, unspecified obesity type Sierra Gray is currently in the action stage of change. As such, her goal is to continue with weight loss efforts. She has agreed to practicing portion control and making smarter food choices, such as increasing vegetables and decreasing simple carbohydrates.   Exercise goals: No exercise has been prescribed at this time.  Behavioral modification strategies: meal planning and cooking strategies and keeping healthy foods in the home.  Sierra Gray has agreed to follow-up with our clinic in 2 weeks. She was informed of the importance of frequent follow-up visits to maximize her success with intensive lifestyle modifications for her multiple health conditions.   Objective:   Blood pressure 111/75, pulse 88, temperature 98.6 F (37 C), height 5\' 4"  (1.626 m), weight 199 lb (90.3 kg), SpO2 98 %. Body mass index is 34.16 kg/m.  General: Cooperative, alert, well developed, in no acute distress. HEENT: Conjunctivae and lids unremarkable. Cardiovascular: Regular rhythm.  Lungs: Normal work of breathing. Neurologic: No focal deficits.   Lab Results  Component Value Date   CREATININE 0.89 12/19/2020   BUN 19 12/19/2020   NA 141 12/19/2020   K 4.0 12/19/2020   CL 107 12/19/2020   CO2 24 12/19/2020   Lab Results  Component Value Date   ALT 17 12/19/2020   AST 15 12/19/2020   ALKPHOS 72 12/19/2020   BILITOT 0.6 12/19/2020   Lab Results  Component Value Date   HGBA1C 5.6 11/09/2020   HGBA1C 5.6 07/11/2020   HGBA1C 5.5 09/04/2018   Lab Results  Component Value Date  INSULIN 23.9 11/09/2020   INSULIN 25.7 (H) 07/11/2020   Lab Results  Component Value Date   TSH 0.285 (L) 07/11/2020   Lab Results  Component Value Date   CHOL 238 (H) 11/09/2020   HDL 41 11/09/2020   LDLCALC 167 (H) 11/09/2020   TRIG 161 (H) 11/09/2020   CHOLHDL 5.8 (H) 11/09/2020   Lab Results  Component Value Date   WBC 7.6 12/19/2020   HGB 13.7 12/19/2020   HCT  42.1 12/19/2020   MCV 86.3 12/19/2020   PLT 358 12/19/2020   No results found for: IRON, TIBC, FERRITIN  Attestation Statements:   Reviewed by clinician on day of visit: allergies, medications, problem list, medical history, surgical history, family history, social history, and previous encounter notes.  Time spent on visit including pre-visit chart review and post-visit care and charting was 30 minutes.    Wilhemena Durie, am acting as transcriptionist for [employer], PA-C.  I have reviewed the above documentation for accuracy and completeness, and I agree with the above. Abby Potash, PA-C

## 2021-01-03 ENCOUNTER — Other Ambulatory Visit: Payer: Self-pay

## 2021-01-03 ENCOUNTER — Encounter (INDEPENDENT_AMBULATORY_CARE_PROVIDER_SITE_OTHER): Payer: Self-pay | Admitting: Adult Health

## 2021-01-03 ENCOUNTER — Ambulatory Visit (INDEPENDENT_AMBULATORY_CARE_PROVIDER_SITE_OTHER): Payer: 59 | Admitting: Adult Health

## 2021-01-03 VITALS — BP 134/78 | HR 71 | Temp 98.4°F | Ht 64.0 in | Wt 199.0 lb

## 2021-01-03 DIAGNOSIS — C562 Malignant neoplasm of left ovary: Secondary | ICD-10-CM | POA: Diagnosis not present

## 2021-01-03 DIAGNOSIS — Z6834 Body mass index (BMI) 34.0-34.9, adult: Secondary | ICD-10-CM | POA: Diagnosis not present

## 2021-01-03 DIAGNOSIS — E669 Obesity, unspecified: Secondary | ICD-10-CM

## 2021-01-03 DIAGNOSIS — Z9189 Other specified personal risk factors, not elsewhere classified: Secondary | ICD-10-CM | POA: Diagnosis not present

## 2021-01-03 DIAGNOSIS — I10 Essential (primary) hypertension: Secondary | ICD-10-CM | POA: Diagnosis not present

## 2021-01-03 MED ORDER — AMLODIPINE BESYLATE 5 MG PO TABS: 5.0000 mg | ORAL_TABLET | Freq: Every day | ORAL | 0 refills | Status: DC

## 2021-01-04 NOTE — Progress Notes (Signed)
Chief Complaint:   OBESITY Sierra Gray is here to discuss her progress with her obesity treatment plan along with follow-up of her obesity related diagnoses. Sierra Gray is on practicing portion control and making smarter food choices, such as increasing vegetables and decreasing simple carbohydrates and states she is following her eating plan approximately 50% of the time. Sierra Gray states she is exercising 0 minutes 0 times per week.  Today's visit was #: 11 Starting weight: 212 lbs Starting date: 07/11/2020 Today's weight: 199 lbs Today's date: 01/03/2021 Total lbs lost to date: 13 lbs Total lbs lost since last in-office visit: 0  Interim History: Pt is in search of a new PCP. Recommendations of local providers given to pt. She needs bridge Rx of anti-hypertensive, amlodipine, during the interim. She reports increased carb cravings and snacking on potato chips and cheese n' crackers.  Of note, her father passed away of pancreatic cancer.  Subjective:   1. Essential hypertension BP and heart rate are at goal. Pt needs bridge Rx of amlodipine 5 mg daily. She is in search of a new PCP. She denies lower extremity edema. CMP 12/19/2020 is stable.  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 She is followed by Oncologist Dr. Alvy Bimler. She is on Arimidex 1 mg daily. She is experiencing hot flashes every morning. Oncology ordered mammogram and MRI.  3. At risk for heart disease [place] is at a higher than average risk for cardiovascular disease due to hypertension and obesity.    Assessment/Plan:   1. Essential hypertension Sierra Gray is working on healthy weight loss and exercise to improve blood pressure control. We will watch for signs of hypotension as she continues her lifestyle modifications. One time refill of amlodipine 5 mg for 90 day supply sent to pharmacy. Pt is aware  she will need to obtain further refills from new PCP.  - amLODipine (NORVASC) 5 MG tablet; Take 1 tablet (5 mg total) by mouth daily.  Dispense: 90 tablet; Refill: 0  2. Left ovarian epithelial cancer ([facility]) Continue daily Arimidex, imaging, and f/u with oncology as directed.  3. At risk for heart disease Sierra Gray was given approximately 15 minutes of coronary artery disease prevention counseling today. She is 60 y.o. female and has risk factors for heart disease including obesity. We discussed intensive lifestyle modifications today with an emphasis on specific weight loss instructions and strategies.   Repetitive spaced learning was employed today to elicit superior memory formation and behavioral change.  4. Class 1 obesity with serious comorbidity and body mass index (BMI) of 34.0 to 34.9 in adult, unspecified obesity type [name] is currently in the action stage of change. As such, her goal is to continue with weight loss efforts. She has agreed to practicing portion control and making smarter food choices, such as increasing vegetables and decreasing simple carbohydrates.   Handout: Simple [employer] therapist  Exercise goals: As is  Behavioral modification strategies: increasing lean protein intake, increasing water intake, meal planning and cooking strategies, better snacking choices and planning for success.  Sierra Gray has agreed to follow-up with our clinic in 2 weeks. She was informed of the importance of frequent follow-up visits to maximize her success with intensive lifestyle modifications for her multiple health conditions.   Objective:   Blood pressure 134/78, pulse 71, temperature 98.4 F (36.9 C), height 5'  4" (1.626 m), weight 199 lb (90.3 kg), SpO2 98 %. Body mass index is 34.16 kg/m.  General: Cooperative, alert, well developed, in no acute distress. HEENT: Conjunctivae and lids unremarkable. Cardiovascular: Regular rhythm.  Lungs: Normal work of  breathing. Neurologic: No focal deficits.   Lab Results  Component Value Date   CREATININE 0.89 12/19/2020   BUN 19 12/19/2020   NA 141 12/19/2020   K 4.0 12/19/2020   CL 107 12/19/2020   CO2 24 12/19/2020   Lab Results  Component Value Date   ALT 17 12/19/2020   AST 15 12/19/2020   ALKPHOS 72 12/19/2020   BILITOT 0.6 12/19/2020   Lab Results  Component Value Date   HGBA1C 5.6 11/09/2020   HGBA1C 5.6 07/11/2020   HGBA1C 5.5 09/04/2018   Lab Results  Component Value Date   INSULIN 23.9 11/09/2020   INSULIN 25.7 (H) 07/11/2020   Lab Results  Component Value Date   TSH 0.285 (L) 07/11/2020   Lab Results  Component Value Date   CHOL 238 (H) 11/09/2020   HDL 41 11/09/2020   LDLCALC 167 (H) 11/09/2020   TRIG 161 (H) 11/09/2020   CHOLHDL 5.8 (H) 11/09/2020   Lab Results  Component Value Date   WBC 7.6 12/19/2020   HGB 13.7 12/19/2020   HCT 42.1 12/19/2020   MCV 86.3 12/19/2020   PLT 358 12/19/2020   No results found for: IRON, TIBC, FERRITIN  Attestation Statements:   Reviewed by clinician on day of visit: allergies, medications, problem list, medical history, surgical history, family history, social history, and previous encounter notes.  Coral Ceo, am acting as Location manager for Mina Marble, NP.  I have reviewed the above documentation for accuracy and completeness, and I agree with the above. -  Leean Amezcua d. Colletta Spillers, NP-C

## 2021-01-17 ENCOUNTER — Encounter (INDEPENDENT_AMBULATORY_CARE_PROVIDER_SITE_OTHER): Payer: Self-pay | Admitting: Adult Health

## 2021-01-17 ENCOUNTER — Other Ambulatory Visit: Payer: Self-pay

## 2021-01-17 ENCOUNTER — Ambulatory Visit (INDEPENDENT_AMBULATORY_CARE_PROVIDER_SITE_OTHER): Payer: 59 | Admitting: Adult Health

## 2021-01-17 VITALS — BP 122/77 | HR 68 | Temp 98.1°F | Ht 64.0 in | Wt 199.0 lb

## 2021-01-17 DIAGNOSIS — E8881 Metabolic syndrome: Secondary | ICD-10-CM

## 2021-01-17 DIAGNOSIS — I10 Essential (primary) hypertension: Secondary | ICD-10-CM

## 2021-01-17 DIAGNOSIS — Z6834 Body mass index (BMI) 34.0-34.9, adult: Secondary | ICD-10-CM | POA: Diagnosis not present

## 2021-01-17 DIAGNOSIS — E669 Obesity, unspecified: Secondary | ICD-10-CM

## 2021-01-18 NOTE — Progress Notes (Signed)
Chief Complaint:   OBESITY Sierra Gray is here to discuss her progress with her obesity treatment plan along with follow-up of her obesity related diagnoses. Sierra Gray is on practicing portion control and making smarter food choices, such as increasing vegetables and decreasing simple carbohydrates and states she is following her eating plan approximately 50% of the time. Sierra Gray states she is walking 30 minutes 2-3 times per week.  Today's visit was #: 12 Starting weight: 212 lbs Starting date: 07/11/2020 Today's weight: 199 lbs Today's date: 01/17/2021 Total lbs lost to date: 13 lbs Total lbs lost since last in-office visit: 0  Interim History: Pt has tracked intake for a few days and when tracking will hit around 1200 calories per day. She is attending her book club tomorrow night- and will use [employer] top enjoy the foods and company during the event.  Interval goal: Increase water intake, lose down to around 185 lbs by her birthday 02/20/2020.  Subjective:   1. Essential hypertension BP and heart rate are at goal at OV. She is on amlodipine 5 mg daily. She denies lower extremity edema. She has been taking 1/2 tab the last 2 weeks. She is not checking ambulatory BP. She denies cardiac symptoms.  BP Readings from Last 3 Encounters:  01/17/21 122/77  01/03/21 134/78  12/20/20 111/75    2. Insulin resistance 11/09/2020 Insulin level 23.9 with a normal blood glucose of 96.  Lab Results  Component Value Date   INSULIN 23.9 11/09/2020   INSULIN 25.7 (H) 07/11/2020   Lab Results  Component Value Date   HGBA1C 5.6 11/09/2020    Assessment/Plan:   1. Essential hypertension Sierra Gray is working on healthy weight loss and exercise to improve blood pressure control. We will watch for signs of hypotension as she continues her lifestyle modifications. Continue amlodipine 5 mg 1/2 tab daily. Check ambulatory BP daily and bring log to next OV.  2. Insulin resistance Sierra Gray will continue to work on  weight loss, exercise, and decreasing simple carbohydrates to help decrease the risk of diabetes. Sierra Gray agreed to follow-up with Sierra Gray as directed to closely monitor her progress.  3. Class 1 obesity with serious comorbidity and body mass index (BMI) of 34.0 to 34.9 in adult, unspecified obesity type Sierra Gray is currently in the action stage of change. As such, her goal is to continue with weight loss efforts. She has agreed to keeping a food journal and adhering to recommended goals of 1400-1500 calories and 90-100 g protein.   Exercise goals: As is  Behavioral modification strategies: increasing lean protein intake, increasing water intake, meal planning and cooking strategies, celebration eating strategies, planning for success and keeping a strict food journal.  Sierra Gray has agreed to follow-up with our clinic in 2 weeks. She was informed of the importance of frequent follow-up visits to maximize her success with intensive lifestyle modifications for her multiple health conditions.   Objective:   Blood pressure 122/77, pulse 68, temperature 98.1 F (36.7 C), height 5\' 4"  (1.626 m), weight 199 lb (90.3 kg), SpO2 98 %. Body mass index is 34.16 kg/m.  General: Cooperative, alert, well developed, in no acute distress. HEENT: Conjunctivae and lids unremarkable. Cardiovascular: Regular rhythm.  Lungs: Normal work of breathing. Neurologic: No focal deficits.   Lab Results  Component Value Date   CREATININE 0.89 12/19/2020   BUN 19 12/19/2020   NA 141 12/19/2020   K 4.0 12/19/2020   CL 107 12/19/2020   CO2 24 12/19/2020  Lab Results  Component Value Date   ALT 17 12/19/2020   AST 15 12/19/2020   ALKPHOS 72 12/19/2020   BILITOT 0.6 12/19/2020   Lab Results  Component Value Date   HGBA1C 5.6 11/09/2020   HGBA1C 5.6 07/11/2020   HGBA1C 5.5 09/04/2018   Lab Results  Component Value Date   INSULIN 23.9 11/09/2020   INSULIN 25.7 (H) 07/11/2020   Lab Results  Component Value Date    TSH 0.285 (L) 07/11/2020   Lab Results  Component Value Date   CHOL 238 (H) 11/09/2020   HDL 41 11/09/2020   LDLCALC 167 (H) 11/09/2020   TRIG 161 (H) 11/09/2020   CHOLHDL 5.8 (H) 11/09/2020   Lab Results  Component Value Date   WBC 7.6 12/19/2020   HGB 13.7 12/19/2020   HCT 42.1 12/19/2020   MCV 86.3 12/19/2020   PLT 358 12/19/2020   No results found for: IRON, TIBC, FERRITIN  Attestation Statements:   Reviewed by clinician on day of visit: allergies, medications, problem list, medical history, surgical history, family history, social history, and previous encounter notes.  Time spent on visit including pre-visit chart review and post-visit care and charting was 35 minutes.   Coral Ceo, am acting as Location manager for Mina Marble, NP.  I have reviewed the above documentation for accuracy and completeness, and I agree with the above. -  Evanie Buckle d. [place], NP-C

## 2021-02-06 ENCOUNTER — Other Ambulatory Visit: Payer: Self-pay

## 2021-02-06 ENCOUNTER — Encounter (INDEPENDENT_AMBULATORY_CARE_PROVIDER_SITE_OTHER): Payer: Self-pay | Admitting: Adult Health

## 2021-02-06 ENCOUNTER — Ambulatory Visit (INDEPENDENT_AMBULATORY_CARE_PROVIDER_SITE_OTHER): Payer: 59 | Admitting: Adult Health

## 2021-02-06 VITALS — BP 128/82 | HR 87 | Temp 97.8°F | Ht 64.0 in | Wt 199.0 lb

## 2021-02-06 DIAGNOSIS — Z6834 Body mass index (BMI) 34.0-34.9, adult: Secondary | ICD-10-CM | POA: Diagnosis not present

## 2021-02-06 DIAGNOSIS — E8881 Metabolic syndrome: Secondary | ICD-10-CM | POA: Diagnosis not present

## 2021-02-06 DIAGNOSIS — E669 Obesity, unspecified: Secondary | ICD-10-CM

## 2021-02-06 DIAGNOSIS — I10 Essential (primary) hypertension: Secondary | ICD-10-CM | POA: Diagnosis not present

## 2021-02-06 DIAGNOSIS — Z9189 Other specified personal risk factors, not elsewhere classified: Secondary | ICD-10-CM

## 2021-02-06 DIAGNOSIS — E88819 Insulin resistance, unspecified: Secondary | ICD-10-CM

## 2021-02-06 MED ORDER — METFORMIN HCL 500 MG PO TABS: ORAL_TABLET | ORAL | 0 refills | Status: DC

## 2021-02-07 NOTE — Progress Notes (Signed)
Chief Complaint:   OBESITY Sierra Gray is here to discuss her progress with her obesity treatment plan along with follow-up of her obesity related diagnoses. Sierra Gray is on keeping a food journal and adhering to recommended goals of 1400-1500 calories and 90-100 g protein and states she is following her eating plan approximately 50% of the time. Sierra Gray states she is doing 0 minutes 0 times per week.  Today's visit was #: 43 Starting weight: 212 lbs Starting date: 07/11/2020 Today's weight: 199 lbs Today's date: 02/06/2021 Total lbs lost to date: 13 lbs Total lbs lost since last in-office visit: 0  Interim History: When she eats on the plan, Sierra Gray denies actual hunger but she will crave sugary/sweet treats. She has been on category 2, [employer], and journaling program. She prefers the flexibility of the journaling program the best.  Subjective:   1. Essential hypertension Sierra Gray is on amlodipine 5 mg daily and denies lower extremity edema. Her BP and heat rate are excellent at OV today. She denies acute cardiac symptoms.   BP Readings from Last 3 Encounters:  02/06/21 128/82  01/17/21 122/77  01/03/21 134/78    2. Insulin resistance Sierra Gray is experiencing cravings for carbs/sugar. Her 11/09/2020 insulin level 23.9 with normal blood glucose and A1c. 11/09/2020 CMP resulted a GFR of 67.  Lab Results  Component Value Date   INSULIN 23.9 11/09/2020   INSULIN 25.7 (H) 07/11/2020   Lab Results  Component Value Date   HGBA1C 5.6 11/09/2020    Ref. Range 11/09/2020 10:05  GFR, Est Non African American Latest Ref Range: >59 mL/min/1.73 67    3. At risk for diarrhea Sierra Gray is at higher risk of diarrhea due to starting Metformin for insulin resistance.  Assessment/Plan:   1. Essential hypertension Sierra Gray is working on healthy weight loss and exercise to improve blood pressure control. We will watch for signs of hypotension as she continues her lifestyle modifications. Continue amlodipine and  decrease sodium. Check labs in 4 weeks.  2. Insulin resistance Sierra Gray will continue to work on weight loss, exercise, and decreasing simple carbohydrates to help decrease the risk of diabetes. Sierra Gray agreed to follow-up with [name] as directed to closely monitor her progress. Start Metformin 500 mg-1/2 tab QD, as per below. Check labs in 4 weeks.  - metFORMIN (GLUCOPHAGE) 500 MG tablet; 1/2 tab with breakfast once daily  Dispense: 15 tablet; Refill: 0  3. At risk for diarrhea Sierra Gray was given approximately 15 minutes of diarrhea prevention counseling today. She is 60 y.o. female and has risk factors for diarrhea including medications and changes in diet. We discussed intensive lifestyle modifications today with an emphasis on specific weight loss instructions including dietary strategies.   Repetitive spaced learning was employed today to elicit superior memory formation and behavioral change.  4. Class 1 obesity with serious comorbidity and body mass index (BMI) of 34.0 to 34.9 in adult, unspecified obesity type Sierra Gray is currently in the action stage of change. As such, her goal is to continue with weight loss efforts. She has agreed to keeping a food journal and adhering to recommended goals of 1400-1500 calories and 90-100 protein.   Exercise goals: As is- increase daily walking  Behavioral modification strategies: increasing lean protein intake, meal planning and cooking strategies, planning for success and keeping a strict food journal.  Sierra Gray has agreed to follow-up with our clinic in 2 weeks. She was informed of the importance of frequent follow-up visits to maximize her success with intensive  lifestyle modifications for her multiple health conditions.   Objective:   Blood pressure 128/82, pulse 87, temperature 97.8 F (36.6 C), height 5\' 4"  (1.626 m), weight 199 lb (90.3 kg), SpO2 96 %. Body mass index is 34.16 kg/m.  General: Cooperative, alert, well developed, in no acute  distress. HEENT: Conjunctivae and lids unremarkable. Cardiovascular: Regular rhythm.  Lungs: Normal work of breathing. Neurologic: No focal deficits.   Lab Results  Component Value Date   CREATININE 0.89 12/19/2020   BUN 19 12/19/2020   NA 141 12/19/2020   K 4.0 12/19/2020   CL 107 12/19/2020   CO2 24 12/19/2020   Lab Results  Component Value Date   ALT 17 12/19/2020   AST 15 12/19/2020   ALKPHOS 72 12/19/2020   BILITOT 0.6 12/19/2020   Lab Results  Component Value Date   HGBA1C 5.6 11/09/2020   HGBA1C 5.6 07/11/2020   HGBA1C 5.5 09/04/2018   Lab Results  Component Value Date   INSULIN 23.9 11/09/2020   INSULIN 25.7 (H) 07/11/2020   Lab Results  Component Value Date   TSH 0.285 (L) 07/11/2020   Lab Results  Component Value Date   CHOL 238 (H) 11/09/2020   HDL 41 11/09/2020   LDLCALC 167 (H) 11/09/2020   TRIG 161 (H) 11/09/2020   CHOLHDL 5.8 (H) 11/09/2020   Lab Results  Component Value Date   WBC 7.6 12/19/2020   HGB 13.7 12/19/2020   HCT 42.1 12/19/2020   MCV 86.3 12/19/2020   PLT 358 12/19/2020   No results found for: IRON, TIBC, FERRITIN   Attestation Statements:   Reviewed by clinician on day of visit: allergies, medications, problem list, medical history, surgical history, family history, social history, and previous encounter notes.  Coral Ceo, am acting as Location manager for Mina Marble, NP.  I have reviewed the above documentation for accuracy and completeness, and I agree with the above. -  Mairen Wallenstein d. Emarie Paul, NP-C

## 2021-02-28 ENCOUNTER — Other Ambulatory Visit (INDEPENDENT_AMBULATORY_CARE_PROVIDER_SITE_OTHER): Payer: Self-pay | Admitting: Adult Health

## 2021-02-28 DIAGNOSIS — E8881 Metabolic syndrome: Secondary | ICD-10-CM

## 2021-03-06 ENCOUNTER — Ambulatory Visit (INDEPENDENT_AMBULATORY_CARE_PROVIDER_SITE_OTHER): Payer: 59 | Admitting: Adult Health

## 2021-03-06 ENCOUNTER — Encounter (INDEPENDENT_AMBULATORY_CARE_PROVIDER_SITE_OTHER): Payer: Self-pay | Admitting: Adult Health

## 2021-03-06 ENCOUNTER — Other Ambulatory Visit: Payer: Self-pay

## 2021-03-06 VITALS — BP 124/79 | HR 99 | Temp 97.7°F | Ht 64.0 in | Wt 200.0 lb

## 2021-03-06 DIAGNOSIS — E8881 Metabolic syndrome: Secondary | ICD-10-CM

## 2021-03-06 DIAGNOSIS — I10 Essential (primary) hypertension: Secondary | ICD-10-CM | POA: Diagnosis not present

## 2021-03-06 DIAGNOSIS — Z9189 Other specified personal risk factors, not elsewhere classified: Secondary | ICD-10-CM | POA: Diagnosis not present

## 2021-03-06 DIAGNOSIS — Z6836 Body mass index (BMI) 36.0-36.9, adult: Secondary | ICD-10-CM

## 2021-03-06 DIAGNOSIS — E88819 Insulin resistance, unspecified: Secondary | ICD-10-CM

## 2021-03-06 MED ORDER — METFORMIN HCL 500 MG PO TABS: ORAL_TABLET | ORAL | 0 refills | Status: DC

## 2021-03-06 NOTE — Telephone Encounter (Signed)
Refill request

## 2021-03-07 NOTE — Progress Notes (Signed)
Chief Complaint:   OBESITY Sierra Gray is here to discuss her progress with her obesity treatment plan along with follow-up of her obesity related diagnoses. Sierra Gray is on keeping a food journal and adhering to recommended goals of 1400-1500 calories and 90-100 protein and states she is following her eating plan approximately 25% of the time. Sierra Gray states she is doing 0 minutes 0 times per week.  Today's visit was #: 14 Starting weight: 212 lbs Starting date: 07/11/2020 Today's weight: 200 lbs Today's date: 03/06/2021 Total lbs lost to date: 12 lbs Total lbs lost since last in-office visit: 0  Interim History: Sierra Gray has been inconsistent with Metformin 500 mg 1/2 tab QD- only 4 doses and experienced loose stools with each dose. She did not take Metformin during recent travel due to concerns over GI upset.  Subjective:   1. Insulin resistance Sierra Gray was started on Metformin 500 mg 1/2 tab with breakfast on 02/06/2021. She has experienced diarrhea after each dose- she has taken 4 doses. Her 11/09/2020 insulin level was 23.9.  Lab Results  Component Value Date   INSULIN 23.9 11/09/2020   INSULIN 25.7 (H) 07/11/2020   Lab Results  Component Value Date   HGBA1C 5.6 11/09/2020    2. Essential hypertension Sierra Gray's BP/HR are excellent at OV. She is on amlodipine 5 mg QD.   BP Readings from Last 3 Encounters:  03/06/21 124/79  02/06/21 128/82  01/17/21 122/77    3. At risk for diarrhea Sierra Gray is at risk for diarrhea due to Metformin and obesity.  Assessment/Plan:   1. Insulin resistance Sierra Gray will continue to work on weight loss, exercise, and decreasing simple carbohydrates to help decrease the risk of diabetes. Sierra Gray agreed to follow-up with [name] as directed to closely monitor her progress. Check labs at next OV. Recommend consistent use of Metformin with a full meal to work through GI upset.  - metFORMIN (GLUCOPHAGE) 500 MG tablet; 1/2 tab with breakfast once daily  Dispense: 15  tablet; Refill: 0  2. Essential hypertension Sierra Gray is working on healthy weight loss and exercise to improve blood pressure control. We will watch for signs of hypotension as she continues her lifestyle modifications. Continue CCB and limit sodium. Check labs at next OV.  3. At risk for diarrhea Sierra Gray was given approximately 15 minutes of diarrhea prevention counseling today. She is 60 y.o. female and has risk factors for diarrhea including medications and changes in diet. We discussed intensive lifestyle modifications today with an emphasis on specific weight loss instructions including dietary strategies.   Repetitive spaced learning was employed today to elicit superior memory formation and behavioral change.  4. Current BMI 34.3 Sierra Gray is currently in the action stage of change. As such, her goal is to continue with weight loss efforts. She has agreed to keeping a food journal and adhering to recommended goals of 1400-1500 calories and 90-100 protein.   Fasting labs at next OV. Take Metformin with full meal  Exercise goals: As is  Behavioral modification strategies: increasing lean protein intake, decreasing simple carbohydrates, increasing water intake, meal planning and cooking strategies, planning for success and keeping a strict food journal.  Sierra Gray has agreed to follow-up with our clinic fasting in 4 weeks. She was informed of the importance of frequent follow-up visits to maximize her success with intensive lifestyle modifications for her multiple health conditions.   Objective:   Blood pressure 124/79, pulse 99, temperature 97.7 F (36.5 C), height 5\' 4"  (1.626 m), weight  200 lb (90.7 kg), SpO2 99 %. Body mass index is 34.33 kg/m.  General: Cooperative, alert, well developed, in no acute distress. HEENT: Conjunctivae and lids unremarkable. Cardiovascular: Regular rhythm.  Lungs: Normal work of breathing. Neurologic: No focal deficits.   Lab Results  Component Value Date    CREATININE 0.89 12/19/2020   BUN 19 12/19/2020   NA 141 12/19/2020   K 4.0 12/19/2020   CL 107 12/19/2020   CO2 24 12/19/2020   Lab Results  Component Value Date   ALT 17 12/19/2020   AST 15 12/19/2020   ALKPHOS 72 12/19/2020   BILITOT 0.6 12/19/2020   Lab Results  Component Value Date   HGBA1C 5.6 11/09/2020   HGBA1C 5.6 07/11/2020   HGBA1C 5.5 09/04/2018   Lab Results  Component Value Date   INSULIN 23.9 11/09/2020   INSULIN 25.7 (H) 07/11/2020   Lab Results  Component Value Date   TSH 0.285 (L) 07/11/2020   Lab Results  Component Value Date   CHOL 238 (H) 11/09/2020   HDL 41 11/09/2020   LDLCALC 167 (H) 11/09/2020   TRIG 161 (H) 11/09/2020   CHOLHDL 5.8 (H) 11/09/2020   Lab Results  Component Value Date   WBC 7.6 12/19/2020   HGB 13.7 12/19/2020   HCT 42.1 12/19/2020   MCV 86.3 12/19/2020   PLT 358 12/19/2020   No results found for: IRON, TIBC, FERRITIN   Attestation Statements:   Reviewed by clinician on day of visit: allergies, medications, problem list, medical history, surgical history, family history, social history, and previous encounter notes.  Coral Ceo, am acting as Location manager for Mina Marble, NP.  I have reviewed the above documentation for accuracy and completeness, and I agree with the above. -  Albertus Chiarelli d. Daoud Lobue, NP-C

## 2021-03-15 ENCOUNTER — Other Ambulatory Visit: Payer: Self-pay | Admitting: Hematology and Oncology

## 2021-03-15 DIAGNOSIS — Z1231 Encounter for screening mammogram for malignant neoplasm of breast: Secondary | ICD-10-CM

## 2021-04-02 ENCOUNTER — Ambulatory Visit (INDEPENDENT_AMBULATORY_CARE_PROVIDER_SITE_OTHER): Payer: 59 | Admitting: Adult Health

## 2021-04-04 ENCOUNTER — Encounter (INDEPENDENT_AMBULATORY_CARE_PROVIDER_SITE_OTHER): Payer: Self-pay

## 2021-04-04 ENCOUNTER — Other Ambulatory Visit (INDEPENDENT_AMBULATORY_CARE_PROVIDER_SITE_OTHER): Payer: Self-pay | Admitting: Adult Health

## 2021-04-04 DIAGNOSIS — E8881 Metabolic syndrome: Secondary | ICD-10-CM

## 2021-04-04 NOTE — Telephone Encounter (Signed)
Message sent to patient

## 2021-04-11 ENCOUNTER — Ambulatory Visit (INDEPENDENT_AMBULATORY_CARE_PROVIDER_SITE_OTHER): Payer: 59 | Admitting: Adult Health

## 2021-04-11 ENCOUNTER — Other Ambulatory Visit: Payer: Self-pay

## 2021-04-11 ENCOUNTER — Encounter (INDEPENDENT_AMBULATORY_CARE_PROVIDER_SITE_OTHER): Payer: Self-pay | Admitting: Adult Health

## 2021-04-11 VITALS — BP 121/78 | HR 83 | Temp 98.2°F | Ht 64.0 in | Wt 200.0 lb

## 2021-04-11 DIAGNOSIS — Z9189 Other specified personal risk factors, not elsewhere classified: Secondary | ICD-10-CM

## 2021-04-11 DIAGNOSIS — E8881 Metabolic syndrome: Secondary | ICD-10-CM

## 2021-04-11 DIAGNOSIS — E559 Vitamin D deficiency, unspecified: Secondary | ICD-10-CM | POA: Diagnosis not present

## 2021-04-11 DIAGNOSIS — I10 Essential (primary) hypertension: Secondary | ICD-10-CM

## 2021-04-11 DIAGNOSIS — E669 Obesity, unspecified: Secondary | ICD-10-CM

## 2021-04-11 DIAGNOSIS — E782 Mixed hyperlipidemia: Secondary | ICD-10-CM

## 2021-04-11 DIAGNOSIS — Z6834 Body mass index (BMI) 34.0-34.9, adult: Secondary | ICD-10-CM

## 2021-04-12 ENCOUNTER — Encounter (INDEPENDENT_AMBULATORY_CARE_PROVIDER_SITE_OTHER): Payer: Self-pay | Admitting: Adult Health

## 2021-04-12 ENCOUNTER — Other Ambulatory Visit (INDEPENDENT_AMBULATORY_CARE_PROVIDER_SITE_OTHER): Payer: Self-pay | Admitting: Adult Health

## 2021-04-12 DIAGNOSIS — E8881 Metabolic syndrome: Secondary | ICD-10-CM

## 2021-04-12 LAB — COMPREHENSIVE METABOLIC PANEL
ALT: 18 IU/L (ref 0–32)
AST: 16 IU/L (ref 0–40)
Albumin/Globulin Ratio: 2 (ref 1.2–2.2)
Albumin: 5 g/dL — ABNORMAL HIGH (ref 3.8–4.9)
Alkaline Phosphatase: 83 IU/L (ref 44–121)
BUN/Creatinine Ratio: 18 (ref 9–23)
BUN: 18 mg/dL (ref 6–24)
Bilirubin Total: 0.6 mg/dL (ref 0.0–1.2)
CO2: 23 mmol/L (ref 20–29)
Calcium: 10.2 mg/dL (ref 8.7–10.2)
Chloride: 103 mmol/L (ref 96–106)
Creatinine, Ser: 1 mg/dL (ref 0.57–1.00)
Globulin, Total: 2.5 g/dL (ref 1.5–4.5)
Glucose: 95 mg/dL (ref 65–99)
Potassium: 4.7 mmol/L (ref 3.5–5.2)
Sodium: 143 mmol/L (ref 134–144)
Total Protein: 7.5 g/dL (ref 6.0–8.5)
eGFR: 65 mL/min/{1.73_m2} (ref >59)

## 2021-04-12 LAB — LIPID PANEL
Chol/HDL Ratio: 5.2 ratio — ABNORMAL HIGH (ref 0.0–4.4)
Cholesterol, Total: 267 mg/dL — ABNORMAL HIGH (ref 100–199)
HDL: 51 mg/dL (ref >39)
LDL Chol Calc (NIH): 188 mg/dL — ABNORMAL HIGH (ref 0–99)
Triglycerides: 154 mg/dL — ABNORMAL HIGH (ref 0–149)
VLDL Cholesterol Cal: 28 mg/dL (ref 5–40)

## 2021-04-12 LAB — VITAMIN D 25 HYDROXY (VIT D DEFICIENCY, FRACTURES): Vit D, 25-Hydroxy: 50.1 ng/mL (ref 30.0–100.0)

## 2021-04-12 LAB — HEMOGLOBIN A1C
Est. average glucose Bld gHb Est-mCnc: 120 mg/dL
Hgb A1c MFr Bld: 5.8 % — ABNORMAL HIGH (ref 4.8–5.6)

## 2021-04-12 LAB — INSULIN, RANDOM: INSULIN: 16.7 u[IU]/mL (ref 2.6–24.9)

## 2021-04-12 MED ORDER — METFORMIN HCL 500 MG PO TABS: ORAL_TABLET | ORAL | 0 refills | Status: DC

## 2021-04-12 NOTE — Progress Notes (Signed)
Chief Complaint:   OBESITY Sierra Gray is here to discuss her progress with her obesity treatment plan along with follow-up of her obesity related diagnoses. Sierra Gray is on keeping a food journal and adhering to recommended goals of 1400-1500 calories and 90-100 g protein and states she is following her eating plan approximately 50% of the time. Sierra Gray states she is walking 45 minutes 4 times per week.  Today's visit was #: 15 Starting weight: 212 lbs Starting date: 07/11/2020 Today's weight: 200 lbs Today's date: 04/11/2021 Total lbs lost to date: 12 Total lbs lost since last in-office visit: 0  Interim History: Sierra Gray has been inconsistently using Metformin 500 mg 1/2 tab. She estimates to use 3-4 times a week.  She denies GI upset with Biguanide. She tracks intake 50% of the time and consumes on averag 1450 calorie with 80-90 grams protein per day.  Subjective:   1. Mixed hyperlipidemia Last lipid panel- total (238), triglycerides (161), and LDL (167) all above goal.   2. Vitamin D deficiency Sierra Gray's Vitamin D level was 46.0 on 11/09/2020. She is currently taking OTC vitamin D 5,000 IU each day (via combo Vit D and multivitamin). She denies nausea, vomiting or muscle weakness.  3. Essential hypertension BP/HR excellent at OV. She is on amlodipine 5 mg 1/2 tab QD. She denies lower extremity edema. She reports decreased dizziness with decreased calcium beta blocker dose.  BP Readings from Last 3 Encounters:  04/11/21 121/78  03/06/21 124/79  02/06/21 128/82   4. Insulin resistance 11/09/2020 BG 96 and A1c 5.6, both WNL. Insulin level 23.9. Pt sporadically was taking Metformin 500 mg 1/2 tab QD 3-4 times a week.   5. At risk for diabetes mellitus Ashe is at higher than average risk for developing diabetes due to obesity.   Assessment/Plan:   1. Mixed hyperlipidemia Cardiovascular risk and specific lipid/LDL goals reviewed.  We discussed several lifestyle modifications today and  Juanetta will continue to work on diet, exercise and weight loss efforts. Orders and follow up as documented in patient record. Check labs today.  Counseling Intensive lifestyle modifications are the first line treatment for this issue. . Dietary changes: Increase soluble fiber. Decrease simple carbohydrates. . Exercise changes: Moderate to vigorous-intensity aerobic activity 150 minutes per week if tolerated. . Lipid-lowering medications: see documented in medical record.  - Lipid panel  2. Vitamin D deficiency Low Vitamin D level contributes to fatigue and are associated with obesity, breast, and colon cancer. She agrees to continue to take prescription Vitamin D @50 ,000 IU every week and will follow-up for routine testing of Vitamin D, at least 2-3 times per year to avoid over-replacement. Check labs today.  - VITAMIN D 25 Hydroxy (Vit-D Deficiency, Fractures)  3. Essential hypertension Sierra Gray is working on healthy weight loss and exercise to improve blood pressure control. We will watch for signs of hypotension as she continues her lifestyle modifications. Check labs today.  - Comprehensive metabolic panel  4. Insulin resistance Sierra Gray will continue to work on weight loss, exercise, and decreasing simple carbohydrates to help decrease the risk of diabetes. Sierra Gray agreed to follow-up with [name] as directed to closely monitor her progress. Check labs today.  - Hemoglobin A1c - Insulin, random  5. At risk for diabetes mellitus Sierra Gray was given approximately 15 minutes of diabetes education and counseling today. We discussed intensive lifestyle modifications today with an emphasis on weight loss as well as increasing exercise and decreasing simple carbohydrates in her diet. We also  reviewed medication options with an emphasis on risk versus benefit of those discussed.   Repetitive spaced learning was employed today to elicit superior memory formation and behavioral change.  6. Class 1 obesity  with serious comorbidity and body mass index (BMI) of 34.0 to 34.9 in adult, unspecified obesity type  [place] is currently in the action stage of change. As such, her goal is to continue with weight loss efforts. She has agreed to keeping a food journal and adhering to recommended goals of 1400-1500 calories and 90-100 gram protein.   Exercise goals: As is  Behavioral modification strategies: increasing lean protein intake, decreasing simple carbohydrates, meal planning and cooking strategies, planning for success and keeping a strict food journal.  Sierra Gray has agreed to follow-up with our clinic in 5 weeks. She was informed of the importance of frequent follow-up visits to maximize her success with intensive lifestyle modifications for her multiple health conditions.   Sierra Gray was informed we would discuss her lab results at her next visit unless there is a critical issue that needs to be addressed sooner. Sierra Gray agreed to keep her next visit at the agreed upon time to discuss these results.  Objective:   Blood pressure 121/78, pulse 83, temperature 98.2 F (36.8 C), height 5\' 4"  (1.626 m), weight 200 lb (90.7 kg), SpO2 96 %. Body mass index is 34.33 kg/m.  General: Cooperative, alert, well developed, in no acute distress. HEENT: Conjunctivae and lids unremarkable. Cardiovascular: Regular rhythm.  Lungs: Normal work of breathing. Neurologic: No focal deficits.   Lab Results  Component Value Date   CREATININE 1.00 04/11/2021   BUN 18 04/11/2021   NA 143 04/11/2021   K 4.7 04/11/2021   CL 103 04/11/2021   CO2 23 04/11/2021   Lab Results  Component Value Date   ALT 18 04/11/2021   AST 16 04/11/2021   ALKPHOS 83 04/11/2021   BILITOT 0.6 04/11/2021   Lab Results  Component Value Date   HGBA1C 5.8 (H) 04/11/2021   HGBA1C 5.6 11/09/2020   HGBA1C 5.6 07/11/2020   HGBA1C 5.5 09/04/2018   Lab Results  Component Value Date   INSULIN 16.7 04/11/2021   INSULIN 23.9 11/09/2020    INSULIN 25.7 (H) 07/11/2020   Lab Results  Component Value Date   TSH 0.285 (L) 07/11/2020   Lab Results  Component Value Date   CHOL 267 (H) 04/11/2021   HDL 51 04/11/2021   LDLCALC 188 (H) 04/11/2021   TRIG 154 (H) 04/11/2021   CHOLHDL 5.2 (H) 04/11/2021   Lab Results  Component Value Date   WBC 7.6 12/19/2020   HGB 13.7 12/19/2020   HCT 42.1 12/19/2020   MCV 86.3 12/19/2020   PLT 358 12/19/2020    Attestation Statements:   Reviewed by clinician on day of visit: allergies, medications, problem list, medical history, surgical history, family history, social history, and previous encounter notes.  Coral Ceo, CMA, am acting as transcriptionist for Mina Marble, NP.  I have reviewed the above documentation for accuracy and completeness, and I agree with the above. -  Abbegale Stehle d. Prachi Oftedahl, NP-C

## 2021-04-16 NOTE — Telephone Encounter (Signed)
Pt last seen by Katy Danford, FNP.  

## 2021-04-17 ENCOUNTER — Other Ambulatory Visit: Payer: Self-pay

## 2021-04-17 ENCOUNTER — Ambulatory Visit
Admission: RE | Admit: 2021-04-17 | Discharge: 2021-04-17 | Disposition: A | Discharge status: Home / Self Care | Payer: 59 | Source: Ambulatory Visit | Attending: Hematology and Oncology | Admitting: Hematology and Oncology

## 2021-04-17 DIAGNOSIS — Z1231 Encounter for screening mammogram for malignant neoplasm of breast: Secondary | ICD-10-CM

## 2021-04-26 ENCOUNTER — Other Ambulatory Visit (INDEPENDENT_AMBULATORY_CARE_PROVIDER_SITE_OTHER): Payer: Self-pay | Admitting: Adult Health

## 2021-04-26 DIAGNOSIS — E8881 Metabolic syndrome: Secondary | ICD-10-CM

## 2021-04-26 NOTE — Telephone Encounter (Signed)
Last seen Katy 

## 2021-05-16 ENCOUNTER — Encounter (INDEPENDENT_AMBULATORY_CARE_PROVIDER_SITE_OTHER): Payer: Self-pay | Admitting: Adult Health

## 2021-05-16 ENCOUNTER — Other Ambulatory Visit: Payer: Self-pay

## 2021-05-16 ENCOUNTER — Ambulatory Visit (INDEPENDENT_AMBULATORY_CARE_PROVIDER_SITE_OTHER): Payer: 59 | Admitting: Adult Health

## 2021-05-16 VITALS — BP 107/73 | Ht 64.0 in | Wt 200.0 lb

## 2021-05-16 DIAGNOSIS — R7303 Prediabetes: Secondary | ICD-10-CM | POA: Diagnosis not present

## 2021-05-16 DIAGNOSIS — E559 Vitamin D deficiency, unspecified: Secondary | ICD-10-CM

## 2021-05-16 DIAGNOSIS — E782 Mixed hyperlipidemia: Secondary | ICD-10-CM | POA: Diagnosis not present

## 2021-05-16 DIAGNOSIS — I10 Essential (primary) hypertension: Secondary | ICD-10-CM | POA: Diagnosis not present

## 2021-05-16 DIAGNOSIS — Z6836 Body mass index (BMI) 36.0-36.9, adult: Secondary | ICD-10-CM

## 2021-05-22 DIAGNOSIS — R7303 Prediabetes: Secondary | ICD-10-CM | POA: Insufficient documentation

## 2021-05-22 NOTE — Progress Notes (Signed)
Chief Complaint:   OBESITY Sierra Gray is here to discuss her progress with her obesity treatment plan along with follow-up of her obesity related diagnoses. Sierra Gray is on keeping a food journal and adhering to recommended goals of 1500 calories and 100 grams protein and states she is following her eating plan approximately 75% of the time. Sierra Gray states she is walking 2 miles 3 times per week.  Today's visit was #: 50 Starting weight: 212 lbs Starting date: 07/11/2020 Today's weight: 200 lbs Today's date: 05/16/2021 Total lbs lost to date: 12 Total lbs lost since last in-office visit: 0  Interim History: Sierra Gray will track intake approximately 75% of the time. She will often not hit "complete diary", unsure of cal/protein intake. She continues travel often- to [place] and costal [place].  Subjective:   1. Mixed hyperlipidemia Discussed labs with patient today. 04/11/2021 lipid panel- total 267, HDL 51, triglycerides 154, LDL 188. Total/LDL both worsening. Her mother has HLD and is on statin therapy. She denies cardiac symptoms.  She denies tobacco/vape use.  SCVD risk stratification 3.8%.  Lab Results  Component Value Date   CHOL 267 (H) 04/11/2021   HDL 51 04/11/2021   LDLCALC 188 (H) 04/11/2021   TRIG 154 (H) 04/11/2021   CHOLHDL 5.2 (H) 04/11/2021   Lab Results  Component Value Date   ALT 18 04/11/2021   AST 16 04/11/2021   ALKPHOS 83 04/11/2021   BILITOT 0.6 04/11/2021   The 10-year ASCVD risk score [name]., et al., 2013) is: 3.8%   Values used to calculate the score:     Age: 32 years     Sex: Female     Is Non-Hispanic African American: No     Diabetic: No     Tobacco smoker: No     Systolic Blood Pressure: 423 mmHg     Is BP treated: Yes     HDL Cholesterol: 51 mg/dL     Total Cholesterol: 267 mg/dL  2. Vitamin D deficiency Discussed labs with patient today. Sierra Gray's Vitamin D level was 50.1 on 04/11/2021- at goal. She is currently taking OTC vitamin D 5,000 IU each  day from combo of Vit D and OTC multivitamin). She denies nausea, vomiting or muscle weakness.  3. Pre-diabetes Discussed labs with patient today. 04/11/2021 BG 95, A1c 5.8, and insulin level 16.7. Sierra Gray only took a few doses of Metformin 500 mg 1/2 tab. She is unable to remember to take Rx with meal and diarrhea often occurs.  Lab Results  Component Value Date   HGBA1C 5.8 (H) 04/11/2021   Lab Results  Component Value Date   INSULIN 16.7 04/11/2021   INSULIN 23.9 11/09/2020   INSULIN 25.7 (H) 07/11/2020   4. Essential hypertension Discussed labs with patient today. 04/11/2021 CMP- stable. Sierra Gray denies lower extremity edema. She is on amlodipine 5 mg 1/2 tab QD.   BP Readings from Last 3 Encounters:  05/16/21 107/73  04/11/21 121/78  03/06/21 124/79    Assessment/Plan:   1. Mixed hyperlipidemia Cardiovascular risk and specific lipid/LDL goals reviewed.  We discussed several lifestyle modifications today and Lilia will continue to work on diet, exercise and weight loss efforts. Orders and follow up as documented in patient record.  Continue to reduce saturated fat intake and increase daily activity/exercise.   Counseling Intensive lifestyle modifications are the first line treatment for this issue. Dietary changes: Increase soluble fiber. Decrease simple carbohydrates. Exercise changes: Moderate to vigorous-intensity aerobic activity 150 minutes per  week if tolerated. Lipid-lowering medications: see documented in medical record.   2. Vitamin D deficiency Low Vitamin D level contributes to fatigue and are associated with obesity, breast, and colon cancer. She agrees to continue to take OTC Vitamin D supplement and will follow-up for routine testing of Vitamin D, at least 2-3 times per year to avoid over-replacement.  3. Pre-diabetes Sierra Gray will continue to work on weight loss, exercise, and decreasing simple carbohydrates to help decrease the risk of diabetes.  D/c Metformin due  to GU upset and difficulty with Rx compliance.  4. Essential hypertension [name] is working on healthy weight loss and exercise to improve blood pressure control. We will watch for signs of hypotension as she continues her lifestyle modifications. -Continue amlodipine 5 mg 1/2 tab QD. Monitor for signs of hypotension.  5. Obesity with current BMI 34.4  Sierra Gray is currently in the action stage of change. As such, her goal is to continue with weight loss efforts. She has agreed to following a lower carbohydrate, vegetable and lean protein rich diet plan.   Reset- start low carb eating plan  Exercise goals:  As is  Behavioral modification strategies: increasing lean protein intake, decreasing simple carbohydrates, meal planning and cooking strategies, keeping healthy foods in the home, and planning for success.  Sierra Gray has agreed to follow-up with our clinic in 3-4 weeks. She was informed of the importance of frequent follow-up visits to maximize her success with intensive lifestyle modifications for her multiple health conditions.   Objective:   Blood pressure 107/73, height 5\' 4"  (1.626 m), weight 200 lb (90.7 kg). Body mass index is 34.33 kg/m.  General: Cooperative, alert, well developed, in no acute distress. HEENT: Conjunctivae and lids unremarkable. Cardiovascular: Regular rhythm.  Lungs: Normal work of breathing. Neurologic: No focal deficits.   Lab Results  Component Value Date   CREATININE 1.00 04/11/2021   BUN 18 04/11/2021   NA 143 04/11/2021   K 4.7 04/11/2021   CL 103 04/11/2021   CO2 23 04/11/2021   Lab Results  Component Value Date   ALT 18 04/11/2021   AST 16 04/11/2021   ALKPHOS 83 04/11/2021   BILITOT 0.6 04/11/2021   Lab Results  Component Value Date   HGBA1C 5.8 (H) 04/11/2021   HGBA1C 5.6 11/09/2020   HGBA1C 5.6 07/11/2020   HGBA1C 5.5 09/04/2018   Lab Results  Component Value Date   INSULIN 16.7 04/11/2021   INSULIN 23.9 11/09/2020   INSULIN  25.7 (H) 07/11/2020   Lab Results  Component Value Date   TSH 0.285 (L) 07/11/2020   Lab Results  Component Value Date   CHOL 267 (H) 04/11/2021   HDL 51 04/11/2021   LDLCALC 188 (H) 04/11/2021   TRIG 154 (H) 04/11/2021   CHOLHDL 5.2 (H) 04/11/2021   Lab Results  Component Value Date   WBC 7.6 12/19/2020   HGB 13.7 12/19/2020   HCT 42.1 12/19/2020   MCV 86.3 12/19/2020   PLT 358 12/19/2020   No results found for: IRON, TIBC, FERRITIN   Attestation Statements:   Reviewed by clinician on day of visit: allergies, medications, problem list, medical history, surgical history, family history, social history, and previous encounter notes.  Time spent on visit including pre-visit chart review and post-visit care and charting was 32 minutes.   Coral Ceo, CMA, am acting as transcriptionist for Mina Marble, NP.  I have reviewed the above documentation for accuracy and completeness, and I agree with the above. -  Bryten Maher d. Dixie Jafri, NP-C

## 2021-06-05 ENCOUNTER — Ambulatory Visit (INDEPENDENT_AMBULATORY_CARE_PROVIDER_SITE_OTHER): Payer: 59 | Admitting: Adult Health

## 2021-06-05 ENCOUNTER — Encounter (INDEPENDENT_AMBULATORY_CARE_PROVIDER_SITE_OTHER): Payer: Self-pay | Admitting: Adult Health

## 2021-06-05 ENCOUNTER — Other Ambulatory Visit: Payer: Self-pay

## 2021-06-05 VITALS — BP 115/75 | HR 78 | Temp 98.3°F | Wt 201.0 lb

## 2021-06-05 DIAGNOSIS — I10 Essential (primary) hypertension: Secondary | ICD-10-CM

## 2021-06-05 DIAGNOSIS — Z9189 Other specified personal risk factors, not elsewhere classified: Secondary | ICD-10-CM | POA: Diagnosis not present

## 2021-06-05 DIAGNOSIS — Z6836 Body mass index (BMI) 36.0-36.9, adult: Secondary | ICD-10-CM

## 2021-06-05 DIAGNOSIS — E782 Mixed hyperlipidemia: Secondary | ICD-10-CM

## 2021-06-05 MED ORDER — AMLODIPINE BESYLATE 2.5 MG PO TABS
2.5000 mg | ORAL_TABLET | Freq: Every day | ORAL | 0 refills | Status: DC
Start: 2021-06-05 — End: 2021-09-11

## 2021-06-07 NOTE — Progress Notes (Signed)
Chief Complaint:   OBESITY Sierra Gray is here to discuss her progress with her obesity treatment plan along with follow-up of her obesity related diagnoses. Sierra Gray is on following a lower carbohydrate, vegetable and lean protein rich diet plan and states she is following her eating plan approximately 25% of the time. Sierra Gray states she is walking 35-50 minutes 3 times per week.  Today's visit was #: 10 Starting weight: 212 lbs Starting date: 07/11/2020 Today's weight: 201 lbs Today's date: 06/05/2021 Total lbs lost to date: 11 Total lbs lost since last in-office visit: 0  Interim History: Sierra Gray feels low carb plan was difficult to follow due to travel to [place] for a week- utilized [employer]- up 1 lb.  In the next few weeks, she will attend family friend's wedding- 1. Sierra Gray, [place] 2. Then travel to [facility] for 10 days.  Subjective:   1. Essential hypertension BP/HR excellent at OV.  Sierra Gray is on amlodipine 5 mg QD, 1/2 tab QD. 04/11/2021 CMP stable.  BP Readings from Last 3 Encounters:  06/05/21 115/75  05/16/21 107/73  04/11/21 121/78   2. Mixed hyperlipidemia LDL is steadily rising.  She denies cardiac symptoms and tobacco/vape use.  Lab Results  Component Value Date   ALT 18 04/11/2021   AST 16 04/11/2021   ALKPHOS 83 04/11/2021   BILITOT 0.6 04/11/2021   Lab Results  Component Value Date   CHOL 267 (H) 04/11/2021   HDL 51 04/11/2021   LDLCALC 188 (H) 04/11/2021   TRIG 154 (H) 04/11/2021   CHOLHDL 5.2 (H) 04/11/2021   3. At risk for heart disease Sierra Gray is at a higher than average risk for cardiovascular disease due to obesity, HLD, HTN, and pre-diabetes.  Assessment/Plan:   1. Essential hypertension Sierra Gray is working on healthy weight loss and exercise to improve blood pressure control. We will watch for signs of hypotension as she continues her lifestyle modifications.  Refill- amLODipine (NORVASC) 2.5 MG tablet; Take 1 tablet (2.5 mg total) by  mouth daily.  Dispense: 90 tablet; Refill: 0  2. Mixed hyperlipidemia Cardiovascular risk and specific lipid/LDL goals reviewed.  We discussed several lifestyle modifications today and Sierra Gray will continue to work on diet, exercise and weight loss efforts. Orders and follow up as documented in patient record.  Decrease saturated fat intake. Increase daily walking. Check labs in August 2022.  Counseling Intensive lifestyle modifications are the first line treatment for this issue. Dietary changes: Increase soluble fiber. Decrease simple carbohydrates. Exercise changes: Moderate to vigorous-intensity aerobic activity 150 minutes per week if tolerated. Lipid-lowering medications: see documented in medical record.  3. At risk for heart disease Sierra Gray was given approximately 15 minutes of coronary artery disease prevention counseling today. She is 60 y.o. female and has risk factors for heart disease including obesity. We discussed intensive lifestyle modifications today with an emphasis on specific weight loss instructions and strategies.   Repetitive spaced learning was employed today to elicit superior memory formation and behavioral change.   4. Obesity with current BMI 34.6  Sierra Gray is currently in the action stage of change. As such, her goal is to continue with weight loss efforts. She has agreed to keeping a food journal and adhering to recommended goals of 1500 calories and 100 g protein.   Drink 80 oz plain water per day Consume 100g protein/day   Exercise goals:  As is  Behavioral modification strategies: increasing lean protein intake, decreasing simple carbohydrates, meal planning and cooking strategies, keeping  healthy foods in the home, travel eating strategies, holiday eating strategies , and planning for success.  Sierra Gray has agreed to follow-up with our clinic in 4 weeks. She was informed of the importance of frequent follow-up visits to maximize her success with intensive  lifestyle modifications for her multiple health conditions.   Objective:   Blood pressure 115/75, pulse 78, temperature 98.3 F (36.8 C), weight 201 lb (91.2 kg), SpO2 98 %. Body mass index is 34.5 kg/m.  General: Cooperative, alert, well developed, in no acute distress. HEENT: Conjunctivae and lids unremarkable. Cardiovascular: Regular rhythm.  Lungs: Normal work of breathing. Neurologic: No focal deficits.   Lab Results  Component Value Date   CREATININE 1.00 04/11/2021   BUN 18 04/11/2021   NA 143 04/11/2021   K 4.7 04/11/2021   CL 103 04/11/2021   CO2 23 04/11/2021   Lab Results  Component Value Date   ALT 18 04/11/2021   AST 16 04/11/2021   ALKPHOS 83 04/11/2021   BILITOT 0.6 04/11/2021   Lab Results  Component Value Date   HGBA1C 5.8 (H) 04/11/2021   HGBA1C 5.6 11/09/2020   HGBA1C 5.6 07/11/2020   HGBA1C 5.5 09/04/2018   Lab Results  Component Value Date   INSULIN 16.7 04/11/2021   INSULIN 23.9 11/09/2020   INSULIN 25.7 (H) 07/11/2020   Lab Results  Component Value Date   TSH 0.285 (L) 07/11/2020   Lab Results  Component Value Date   CHOL 267 (H) 04/11/2021   HDL 51 04/11/2021   LDLCALC 188 (H) 04/11/2021   TRIG 154 (H) 04/11/2021   CHOLHDL 5.2 (H) 04/11/2021   Lab Results  Component Value Date   VD25OH 50.1 04/11/2021   VD25OH 46.0 11/09/2020   VD25OH 40.8 07/11/2020   Lab Results  Component Value Date   WBC 7.6 12/19/2020   HGB 13.7 12/19/2020   HCT 42.1 12/19/2020   MCV 86.3 12/19/2020   PLT 358 12/19/2020   No results found for: IRON, TIBC, FERRITIN  Attestation Statements:   Reviewed by clinician on day of visit: allergies, medications, problem list, medical history, surgical history, family history, social history, and previous encounter notes.  Coral Ceo, CMA, am acting as transcriptionist for Mina Marble, NP.  I have reviewed the above documentation for accuracy and completeness, and I agree with the above. -  Lajarvis Italiano  d. Shauntia Levengood, NP-C

## 2021-06-19 ENCOUNTER — Inpatient Hospital Stay: Payer: 59 | Attending: Hematology and Oncology | Admitting: Hematology and Oncology

## 2021-06-19 ENCOUNTER — Other Ambulatory Visit: Payer: Self-pay

## 2021-06-19 ENCOUNTER — Inpatient Hospital Stay: Payer: 59

## 2021-06-19 ENCOUNTER — Encounter: Payer: Self-pay | Admitting: Hematology and Oncology

## 2021-06-19 VITALS — BP 121/82 | HR 81 | Temp 98.0°F | Resp 18 | Ht 64.0 in | Wt 203.2 lb

## 2021-06-19 DIAGNOSIS — C562 Malignant neoplasm of left ovary: Secondary | ICD-10-CM

## 2021-06-19 DIAGNOSIS — Z800 Family history of malignant neoplasm of digestive organs: Secondary | ICD-10-CM | POA: Diagnosis not present

## 2021-06-19 DIAGNOSIS — Z1509 Genetic susceptibility to other malignant neoplasm: Secondary | ICD-10-CM

## 2021-06-19 DIAGNOSIS — Z9221 Personal history of antineoplastic chemotherapy: Secondary | ICD-10-CM | POA: Insufficient documentation

## 2021-06-19 DIAGNOSIS — Z1501 Genetic susceptibility to malignant neoplasm of breast: Secondary | ICD-10-CM | POA: Insufficient documentation

## 2021-06-19 DIAGNOSIS — Z1589 Genetic susceptibility to other disease: Secondary | ICD-10-CM

## 2021-06-19 DIAGNOSIS — Z1502 Genetic susceptibility to malignant neoplasm of ovary: Secondary | ICD-10-CM | POA: Diagnosis not present

## 2021-06-19 DIAGNOSIS — Z79899 Other long term (current) drug therapy: Secondary | ICD-10-CM | POA: Insufficient documentation

## 2021-06-19 LAB — COMPREHENSIVE METABOLIC PANEL
ALT: 18 U/L (ref 0–44)
AST: 16 U/L (ref 15–41)
Albumin: 4.1 g/dL (ref 3.5–5.0)
Alkaline Phosphatase: 74 U/L (ref 38–126)
Anion gap: 11 (ref 5–15)
BUN: 13 mg/dL (ref 6–20)
CO2: 26 mmol/L (ref 22–32)
Calcium: 10.2 mg/dL (ref 8.9–10.3)
Chloride: 107 mmol/L (ref 98–111)
Creatinine, Ser: 0.93 mg/dL (ref 0.44–1.00)
GFR, Estimated: >60 mL/min (ref >60)
Glucose, Bld: 105 mg/dL — ABNORMAL HIGH (ref 70–99)
Potassium: 4.1 mmol/L (ref 3.5–5.1)
Sodium: 144 mmol/L (ref 135–145)
Total Bilirubin: 0.9 mg/dL (ref 0.3–1.2)
Total Protein: 7.4 g/dL (ref 6.5–8.1)

## 2021-06-19 LAB — CBC WITH DIFFERENTIAL/PLATELET
Abs Immature Granulocytes: 0.01 10*3/uL (ref 0.00–0.07)
Basophils Absolute: 0.1 10*3/uL (ref 0.0–0.1)
Basophils Relative: 1 %
Eosinophils Absolute: 0.3 10*3/uL (ref 0.0–0.5)
Eosinophils Relative: 5 %
HCT: 41.5 % (ref 36.0–46.0)
Hemoglobin: 13.9 g/dL (ref 12.0–15.0)
Immature Granulocytes: 0 %
Lymphocytes Relative: 26 %
Lymphs Abs: 1.8 10*3/uL (ref 0.7–4.0)
MCH: 29 pg (ref 26.0–34.0)
MCHC: 33.5 g/dL (ref 30.0–36.0)
MCV: 86.5 fL (ref 80.0–100.0)
Monocytes Absolute: 0.6 10*3/uL (ref 0.1–1.0)
Monocytes Relative: 9 %
Neutro Abs: 4 10*3/uL (ref 1.7–7.7)
Neutrophils Relative %: 59 %
Platelets: 353 10*3/uL (ref 150–400)
RBC: 4.8 MIL/uL (ref 3.87–5.11)
RDW: 14.1 % (ref 11.5–15.5)
WBC: 6.7 10*3/uL (ref 4.0–10.5)
nRBC: 0 % (ref 0.0–0.2)

## 2021-06-19 NOTE — Assessment & Plan Note (Signed)
She is at risk for pancreatic cancer and breast cancer She is taking Arimidex without major side effects She will be due for MRI of breast in January 2023

## 2021-06-19 NOTE — Progress Notes (Signed)
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 Tumor marker is pending, will call her with results She has palpable lump on her abdomen, unable to rule out reoccurrence Recommend CT imaging for evaluation If it is normal, I will see her again in 6 months for further follow-up  Monoallelic mutation of ATM gene She is at risk for pancreatic cancer and breast cancer She is taking Arimidex without major side effects She will be due for MRI of breast in January 2023  Family history of pancreatic cancer She has genetic mutation and strong family history that predispose her to risk of pancreatic cancer recurrence I will order CT imaging for evaluation  Orders Placed This Encounter  Procedures   CT ABDOMEN PELVIS W CONTRAST    Standing Status:   Future    Standing Expiration Date:   06/19/2022    Order Specific Question:   If indicated for the ordered procedure, I authorize the administration of contrast media per Radiology protocol    Answer:   Yes    Order Specific Question:   Preferred imaging location?    Answer:   [facility]    Order Specific Question:   Radiology Contrast Protocol - do NOT remove file path    Answer:   [email or web address]    Order Specific Question:   Is patient pregnant?    Answer:   No    All questions were answered. The patient knows to call the clinic with any problems, questions or concerns. The total time spent in the appointment was 20 minutes encounter with patients including review of chart and various tests results, discussions about plan of care and coordination of care plan   Heath Lark, MD 06/19/2021 12:48 PM  INTERVAL HISTORY: Please see below for problem oriented charting. 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 Soft tissue, biopsy, right medial thigh MATURE LIPOMA 3. Ovary and fallopian tube, right ENDOMETRIOMA AND SIMPLE SEROUS CYST WITH FALLOPIAN TUBE ADHESION NEGATIVE FOR MALIGNANCY 4. Omentum, resection for tumor BENIGN OMENTUM NEGATIVE FOR CARCINOMA 5. Lymph nodes, regional resection, right pelvic EIGHT BENIGN LYMPH NODES (0/8) 6. Lymph nodes, regional resection, left pelvic SEVEN BENIGN LYMPH NODES (0/7) 7. Lymph node, biopsy, right para-aortic FOUR BENIGN LYMPH NODES (0/4) 8. Lymph node, biopsy, left para-aortic ONE BENIGN LYMPH NODE (0/1) 9. Peritoneum, biopsy, left abdominal BENIGN FIBROMUSCULAR TISSUE 10. Peritoneum, biopsy, right abdominal BENIGN FIBROMUSCULAR TISSUE WITH SEROSITIS 11. Peritoneum, biopsy, pelvic FIBROADIPOSE TISSUE WITH SEROSITIS  Specimen(s): Ovary and fallopian tube Procedure: (including lymph node  sampling): salpingo-oophorectomy Primary tumor site (including laterality): Left ovary Ovarian surface involvement: Yes Ovarian capsule intact without fragmentation: intact Maximum tumor size (cm): 20.0 cm Histologic  type: Endometrioid carcinoma Grade: 1 Peritoneal implants: (specify invasive or non-invasive): Negative Pelvic extension (list additional structures on separate lines and if involved): Negative Lymph nodes: number examined 20 ; number positive 0 TNM code: pT1c2, pN0, pMx FIGO Stage (based on pathologic findings, needs clinical correlation): IC2    09/11/2017 Surgery   Preoperative Diagnosis: 1. left adnexal mass.   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    Procedure(s) Performed: 1. Exploratory laparotomy with bilateral salpingo-oophorectomy, pelvic and para-aortic lymph node dissection, omentectomy and radical debulking for ovarian cancer (CPT [phone])   Surgeon: Everitt Amber, M.D.  Operative Findings:20 cm left ovarian mass.   No intraperitoneal rupture occurred;  Frozen pathology was consistent with adenocarcinoma; right tube and ovary normal in appearance; 3) normal bilateral pelvic and para-aortic lymph nodes and omentum; small and large bowel to palpation. This represented an optimal cytoreduction with no gross visible disease remaining.     10/09/2017 Tumor Marker   Patient's tumor was tested for the following markers: CA-125 Results of the tumor marker test revealed 68.7    10/15/2017 Adverse Reaction   She developed reaction to Paclitaxel.    10/15/2017 - 01/29/2018 Chemotherapy   She received carboplatin. Due to infusion reaction to Taxol with cycle 1, treatment was switched to carboplatin and Abraxane    11/03/2017 Procedure   Successful placement of a right internal jugular approach power injectable Port-A-Cath. The catheter is ready for immediate use.    11/27/2017 Tumor Marker   Patient's tumor was tested for the following markers: CA-125 Results of the tumor marker test revealed 27.7    12/12/2017 Genetic Testing   The patient had genetic testing due to a personal history of ovarian cancer and a family history of pancreatic  cancer (and possibly breast cancer).  The [facility] Hereditary Cancer Panel + tumor HRD analysis was ordered from the laboratory [facility]. The [facility] gene panel offered by [employer] includes sequencing and deletion/duplication testing of the following 29 genes: APC, ATM, BARD1, BMPR1A, BRCA1, BRCA2, BRIP1, CHD1, CDK4, CDKN2A, CHEK2, EPCAM (large rearrangement only), HOXB13, MLH1, MSH2, MSH6, MUTYH, NBN, PALB2, PMS2, PTEN, RAD51C, RAD51D, SMAD4, STK11, and TP53. Sequencing was performed for select regions of POLE and POLD1, and large rearrangement analysis was performed for select regions of GREM1.  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 [id number] (p.[id number]).  The date of this test report is 12/12/2017.    01/08/2018 Tumor Marker   Patient's tumor was tested for the following markers: CA-125 Results of the tumor marker test revealed 17.5    03/02/2018 Tumor Marker   Patient's tumor was tested for the following markers: CA-125 Results of the tumor marker test revealed 11.4    03/14/2018 Imaging   MRCP 1. No worrisome pancreatic lesions are currently present. 2. Cholelithiasis. 3. Small benign-appearing bilateral renal cysts. 4. Midline anterior abdominal wall laxity seen on coronal images, potential small hernia in this vicinity without observed complication    03/18/8263 Procedure   Successful right IJ vein Port-A-Cath explant.    12/21/2019 Tumor Marker   Patient's tumor was tested for the following markers: CA-125 Results of the tumor marker test revealed 9.5.   06/19/2020 Tumor Marker   Patient's tumor was tested for the following markers: CA-125 Results of the tumor marker test revealed 12.5  09/01/2020 Imaging   The BMD measured at Femur Neck Left is 0.866 g/cm2 with a T-score of -1.2. This patient is considered osteopenic according to [facility]) criteria   12/19/2020 Tumor Marker   Patient's tumor was tested for  the following markers: CA-125. Results of the tumor marker test revealed 10.6     REVIEW OF SYSTEMS:   Constitutional: Denies fevers, chills or abnormal weight loss Eyes: Denies blurriness of vision Ears, nose, mouth, throat, and face: Denies mucositis or sore throat Respiratory: Denies cough, dyspnea or wheezes Cardiovascular: Denies palpitation, chest discomfort or lower extremity swelling Gastrointestinal:  Denies nausea, heartburn or change in bowel habits Skin: Denies abnormal skin rashes Lymphatics: Denies new lymphadenopathy or easy bruising Neurological:Denies numbness, tingling or new weaknesses Behavioral/Psych: Mood is stable, no new changes  All other systems were reviewed with the patient and are negative.  I have reviewed the past medical history, past surgical history, social history and family history with the patient and they are unchanged from previous note.  ALLERGIES:  is allergic to bee venom, chlorhexidine, paclitaxel, bacitracin, iodine, and other.  MEDICATIONS:  Current Outpatient Medications  Medication Sig Dispense Refill   amLODipine (NORVASC) 2.5 MG tablet Take 1 tablet (2.5 mg total) by mouth daily. 90 tablet 0   anastrozole (ARIMIDEX) 1 MG tablet TAKE 1 TABLET BY MOUTH EVERY DAY 90 tablet 8   Apple Cider Vinegar 600 MG CAPS Take 1,200 mg by mouth 2 (two) times daily. Takes in morning and in evening.     cetirizine (ZYRTEC) 10 MG tablet Take 10 mg by mouth daily.     Cholecalciferol (VITAMIN D3) 125 MCG (5000 UT) CAPS Take 3,000 Units by mouth daily.      Cinnamon 500 MG TABS Take 2,000 mg by mouth 2 (two) times daily.     ibuprofen (ADVIL,MOTRIN) 200 MG tablet Take 400 mg by mouth every 6 (six) hours as needed.     Inulin (FIBER CHOICE PO) Take 10 g by mouth daily.     Liniments (SALONPAS PAIN RELIEF PATCH EX) Place 1 patch onto the skin daily as needed (knee pain).     Melatonin 5 MG TABS Take 5 mg by mouth daily.      Multiple Vitamins-Minerals (HAIR  SKIN & NAILS ADVANCED PO) Take 1 tablet by mouth daily.     Turmeric 500 MG TABS Take 500 mg by mouth daily.     No current facility-administered medications for this visit.    PHYSICAL EXAMINATION: ECOG PERFORMANCE STATUS: 0 - Asymptomatic  Vitals:   06/19/21 0926  BP: 121/82  Pulse: 81  Resp: 18  Temp: 98 F (36.7 C)  SpO2: 99%   Filed Weights   06/19/21 0926  Weight: 203 lb 3.2 oz (92.2 kg)    GENERAL:alert, no distress and comfortable SKIN: skin color, texture, turgor are normal, no rashes or significant lesions EYES: normal, Conjunctiva are pink and non-injected, sclera clear OROPHARYNX:no exudate, no erythema and lips, buccal mucosa, and tongue normal  NECK: supple, thyroid normal size, non-tender, without nodularity LYMPH:  no palpable lymphadenopathy in the cervical, axillary or inguinal LUNGS: clear to auscultation and percussion with normal breathing effort HEART: regular rate & rhythm and no murmurs and no lower extremity edema ABDOMEN:abdomen soft, noted hernia.  Slight tissue thickening under her midline scar Musculoskeletal:no cyanosis of digits and no clubbing  NEURO: alert & oriented x 3 with fluent speech, no focal motor/sensory deficits  LABORATORY DATA:  I have reviewed  the data as listed    Component Value Date/Time   NA 144 06/19/2021 0906   NA 143 04/11/2021 1157   NA 141 11/27/2017 0818   K 4.1 06/19/2021 0906   K 3.8 11/27/2017 0818   CL 107 06/19/2021 0906   CO2 26 06/19/2021 0906   CO2 25 11/27/2017 0818   GLUCOSE 105 (H) 06/19/2021 0906   GLUCOSE 90 11/27/2017 0818   BUN 13 06/19/2021 0906   BUN 18 04/11/2021 1157   BUN 12.9 11/27/2017 0818   CREATININE 0.93 06/19/2021 0906   CREATININE 0.8 11/27/2017 0818   CALCIUM 10.2 06/19/2021 0906   CALCIUM 9.9 11/27/2017 0818   PROT 7.4 06/19/2021 0906   PROT 7.5 04/11/2021 1157   PROT 7.0 11/27/2017 0818   ALBUMIN 4.1 06/19/2021 0906   ALBUMIN 5.0 (H) 04/11/2021 1157   ALBUMIN 3.8  11/27/2017 0818   AST 16 06/19/2021 0906   AST 11 11/27/2017 0818   ALT 18 06/19/2021 0906   ALT 11 11/27/2017 0818   ALKPHOS 74 06/19/2021 0906   ALKPHOS 68 11/27/2017 0818   BILITOT 0.9 06/19/2021 0906   BILITOT 0.6 04/11/2021 1157   BILITOT 0.41 11/27/2017 0818   GFRNONAA >60 06/19/2021 0906   GFRAA 77 11/09/2020 1005    No results found for: SPEP, UPEP  Lab Results  Component Value Date   WBC 6.7 06/19/2021   NEUTROABS 4.0 06/19/2021   HGB 13.9 06/19/2021   HCT 41.5 06/19/2021   MCV 86.5 06/19/2021   PLT 353 06/19/2021      Chemistry      Component Value Date/Time   NA 144 06/19/2021 0906   NA 143 04/11/2021 1157   NA 141 11/27/2017 0818   K 4.1 06/19/2021 0906   K 3.8 11/27/2017 0818   CL 107 06/19/2021 0906   CO2 26 06/19/2021 0906   CO2 25 11/27/2017 0818   BUN 13 06/19/2021 0906   BUN 18 04/11/2021 1157   BUN 12.9 11/27/2017 0818   CREATININE 0.93 06/19/2021 0906   CREATININE 0.8 11/27/2017 0818      Component Value Date/Time   CALCIUM 10.2 06/19/2021 0906   CALCIUM 9.9 11/27/2017 0818   ALKPHOS 74 06/19/2021 0906   ALKPHOS 68 11/27/2017 0818   AST 16 06/19/2021 0906   AST 11 11/27/2017 0818   ALT 18 06/19/2021 0906   ALT 11 11/27/2017 0818   BILITOT 0.9 06/19/2021 0906   BILITOT 0.6 04/11/2021 1157   BILITOT 0.41 11/27/2017 0818

## 2021-06-19 NOTE — Assessment & Plan Note (Signed)
She has genetic mutation and strong family history that predispose her to risk of pancreatic cancer recurrence I will order CT imaging for evaluation

## 2021-06-19 NOTE — Assessment & Plan Note (Signed)
She has completed adjuvant treatment Tumor marker is pending, will call her with results She has palpable lump on her abdomen, unable to rule out reoccurrence Recommend CT imaging for evaluation If it is normal, I will see her again in 6 months for further follow-up

## 2021-06-20 ENCOUNTER — Telehealth: Payer: Self-pay

## 2021-06-20 LAB — CA 125: Cancer Antigen (CA) 125: 10.4 U/mL (ref 0.0–38.1)

## 2021-06-20 NOTE — Telephone Encounter (Signed)
RN left voicemail.  Contact information left for call back.

## 2021-06-20 NOTE — Telephone Encounter (Signed)
-----   Message from Heath Lark, MD sent at 06/20/2021  9:50 AM EDT ----- Pls call her CA-125 is ok She is supposed to call for CT scan, pls remind her

## 2021-06-22 ENCOUNTER — Telehealth: Payer: Self-pay

## 2021-06-22 NOTE — Telephone Encounter (Signed)
RN left voicemail to call back.

## 2021-06-25 ENCOUNTER — Telehealth: Payer: Self-pay

## 2021-06-25 NOTE — Telephone Encounter (Signed)
Called to see if she wanted to do a virtual visit or in person with Dr. Alvy Bimler on 7/28 at 2 pm. She prefers in person. Appt scheduled and she is aware of appt.

## 2021-07-03 ENCOUNTER — Ambulatory Visit (HOSPITAL_BASED_OUTPATIENT_CLINIC_OR_DEPARTMENT_OTHER)
Admission: RE | Admit: 2021-07-03 | Discharge: 2021-07-03 | Disposition: A | Discharge status: Home / Self Care | Payer: 59 | Source: Ambulatory Visit | Attending: Hematology and Oncology | Admitting: Hematology and Oncology

## 2021-07-03 ENCOUNTER — Encounter (HOSPITAL_BASED_OUTPATIENT_CLINIC_OR_DEPARTMENT_OTHER): Payer: Self-pay

## 2021-07-03 ENCOUNTER — Other Ambulatory Visit: Payer: Self-pay

## 2021-07-03 DIAGNOSIS — Z800 Family history of malignant neoplasm of digestive organs: Secondary | ICD-10-CM | POA: Insufficient documentation

## 2021-07-03 DIAGNOSIS — Z1501 Genetic susceptibility to malignant neoplasm of breast: Secondary | ICD-10-CM | POA: Insufficient documentation

## 2021-07-03 DIAGNOSIS — Z1509 Genetic susceptibility to other malignant neoplasm: Secondary | ICD-10-CM | POA: Diagnosis present

## 2021-07-03 DIAGNOSIS — C562 Malignant neoplasm of left ovary: Secondary | ICD-10-CM | POA: Insufficient documentation

## 2021-07-03 DIAGNOSIS — Z1589 Genetic susceptibility to other disease: Secondary | ICD-10-CM | POA: Insufficient documentation

## 2021-07-03 MED ORDER — IOHEXOL 350 MG/ML SOLN
75.0000 mL | Freq: Once | INTRAVENOUS | Status: AC | PRN
  Administered 2021-07-03: 75 mL via INTRAVENOUS

## 2021-07-04 ENCOUNTER — Encounter (INDEPENDENT_AMBULATORY_CARE_PROVIDER_SITE_OTHER): Payer: Self-pay | Admitting: Adult Health

## 2021-07-04 ENCOUNTER — Other Ambulatory Visit: Payer: Self-pay

## 2021-07-04 ENCOUNTER — Ambulatory Visit (INDEPENDENT_AMBULATORY_CARE_PROVIDER_SITE_OTHER): Payer: 59 | Admitting: Adult Health

## 2021-07-04 VITALS — BP 107/70 | HR 73 | Temp 98.2°F | Ht 64.0 in | Wt 201.0 lb

## 2021-07-04 DIAGNOSIS — I10 Essential (primary) hypertension: Secondary | ICD-10-CM

## 2021-07-04 DIAGNOSIS — R7303 Prediabetes: Secondary | ICD-10-CM | POA: Diagnosis not present

## 2021-07-04 DIAGNOSIS — E669 Obesity, unspecified: Secondary | ICD-10-CM | POA: Diagnosis not present

## 2021-07-04 DIAGNOSIS — Z6834 Body mass index (BMI) 34.0-34.9, adult: Secondary | ICD-10-CM | POA: Diagnosis not present

## 2021-07-05 ENCOUNTER — Encounter: Payer: Self-pay | Admitting: Hematology and Oncology

## 2021-07-05 ENCOUNTER — Telehealth: Payer: Self-pay | Admitting: Oncology

## 2021-07-05 ENCOUNTER — Inpatient Hospital Stay (HOSPITAL_BASED_OUTPATIENT_CLINIC_OR_DEPARTMENT_OTHER): Payer: 59 | Admitting: Hematology and Oncology

## 2021-07-05 VITALS — BP 132/68 | HR 77 | Temp 97.6°F | Resp 18 | Ht 64.0 in | Wt 204.6 lb

## 2021-07-05 DIAGNOSIS — Z1501 Genetic susceptibility to malignant neoplasm of breast: Secondary | ICD-10-CM | POA: Diagnosis not present

## 2021-07-05 DIAGNOSIS — C562 Malignant neoplasm of left ovary: Secondary | ICD-10-CM | POA: Diagnosis not present

## 2021-07-05 DIAGNOSIS — Z1509 Genetic susceptibility to other malignant neoplasm: Secondary | ICD-10-CM | POA: Diagnosis not present

## 2021-07-05 DIAGNOSIS — Z1589 Genetic susceptibility to other disease: Secondary | ICD-10-CM | POA: Diagnosis not present

## 2021-07-05 NOTE — Assessment & Plan Note (Signed)
The patient remained high risk for pancreatic cancer CT imaging show normal appearing pancreas Observe closely I plan CT imaging once a year, due next in July 2023

## 2021-07-05 NOTE — Telephone Encounter (Signed)
Sierra Gray and scheduled follow up appointment on 08/08/21 with Dr. Denman George.

## 2021-07-05 NOTE — Progress Notes (Signed)
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 Denman George, it is way past overdue Her last exam was in October of last year  Monoallelic mutation of ATM gene The patient remained high risk for pancreatic cancer CT imaging show normal appearing pancreas Observe closely I plan CT imaging once a year, due next in July 2023  Positive test for genetic marker of susceptibility to malignant neoplasm of breast She is at high risk of breast cancer Based on current guidelines, she will need screening modality every 6 months, mammogram alternate with MRI She will be due for MRI November of this year I will get it scheduled closer to the time  Orders Placed This Encounter  Procedures   MR BREAST BILATERAL W CONTRAST    Standing Status:   Future    Standing Expiration Date:   07/05/2022    Order Specific Question:   If indicated for the ordered procedure, I authorize the administration of contrast media per Radiology protocol    Answer:   Yes    Order Specific Question:   What is the patient's sedation requirement?    Answer:   No Sedation    Order Specific Question:   Does the patient have a pacemaker or implanted devices?    Answer:   No    Order Specific Question:   Preferred imaging location?    Answer:   [facility] (table limit - 550 lbs)    All questions were answered. The patient knows to call the clinic with any problems, questions or concerns. The total time spent in the appointment was 20 minutes encounter with patients including review of chart and various tests results, discussions about plan of care and coordination of care plan   Heath Lark, MD 07/05/2021 2:08 PM  INTERVAL HISTORY: Please see below for problem  oriented charting. She returns for further follow-up She has no new symptoms except hot flashes She has concerned about weight gain No recent abdominal pain, nausea or changes in bowel habits  SUMMARY OF ONCOLOGIC HISTORY: Oncology History Overview Note  Genetic testing came back positive for ATM variant    Left ovarian epithelial cancer ([place])  08/19/2017 Imaging   She had outside CT scan which showed large abdominal mass    08/27/2017 Tumor Marker   Patient's tumor was tested for the following markers: CA-125 Results of the tumor marker test revealed 904.5    09/11/2017 Pathology Results   1. 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 Soft tissue, biopsy, right medial thigh MATURE LIPOMA 3. Ovary and fallopian tube, right ENDOMETRIOMA AND SIMPLE SEROUS CYST WITH FALLOPIAN TUBE ADHESION NEGATIVE FOR MALIGNANCY 4. Omentum, resection for tumor BENIGN OMENTUM NEGATIVE FOR CARCINOMA 5. Lymph nodes, regional resection, right pelvic EIGHT BENIGN LYMPH NODES (0/8) 6. Lymph nodes, regional resection, left pelvic SEVEN BENIGN LYMPH NODES (0/7) 7. Lymph node, biopsy, right para-aortic FOUR BENIGN LYMPH NODES (0/4) 8. Lymph node, biopsy, left para-aortic ONE BENIGN LYMPH NODE (0/1) 9. Peritoneum, biopsy, left abdominal BENIGN FIBROMUSCULAR TISSUE 10. Peritoneum, biopsy, right abdominal BENIGN FIBROMUSCULAR TISSUE WITH SEROSITIS 11. Peritoneum,  biopsy, pelvic FIBROADIPOSE TISSUE WITH SEROSITIS  Specimen(s): Ovary and fallopian tube Procedure: (including lymph node sampling): salpingo-oophorectomy Primary tumor site (including laterality): Left ovary Ovarian surface involvement: Yes Ovarian capsule intact without fragmentation: intact Maximum tumor size (cm): 20.0 cm Histologic type: Endometrioid  carcinoma Grade: 1 Peritoneal implants: (specify invasive or non-invasive): Negative Pelvic extension (list additional structures on separate lines and if involved): Negative Lymph nodes: number examined 20 ; number positive 0 TNM code: pT1c2, pN0, pMx FIGO Stage (based on pathologic findings, needs clinical correlation): IC2    09/11/2017 Surgery   Preoperative Diagnosis: 1. left adnexal mass.   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    Procedure(s) Performed: 1. Exploratory laparotomy with bilateral salpingo-oophorectomy, pelvic and para-aortic lymph node dissection, omentectomy and radical debulking for ovarian cancer (CPT [phone])   Surgeon: Everitt Amber, M.D.  Operative Findings:20 cm left ovarian mass.   No intraperitoneal rupture occurred;  Frozen pathology was consistent with adenocarcinoma; right tube and ovary normal in appearance; 3) normal bilateral pelvic and para-aortic lymph nodes and omentum; small and large bowel to palpation. This represented an optimal cytoreduction with no gross visible disease remaining.     10/09/2017 Tumor Marker   Patient's tumor was tested for the following markers: CA-125 Results of the tumor marker test revealed 68.7    10/15/2017 Adverse Reaction   She developed reaction to Paclitaxel.    10/15/2017 - 01/29/2018 Chemotherapy   She received carboplatin. Due to infusion reaction to Taxol with cycle 1, treatment was switched to carboplatin and Abraxane    11/03/2017 Procedure   Successful placement of a right internal jugular approach power injectable Port-A-Cath. The catheter is ready for immediate use.    11/27/2017 Tumor Marker   Patient's tumor was tested for the following markers: CA-125 Results of the tumor marker test revealed 27.7    12/12/2017 Genetic Testing   The patient had genetic testing due to a personal history of ovarian cancer and a family history of pancreatic cancer (and  possibly breast cancer).  [facility] Hereditary Cancer Panel + tumor HRD analysis was ordered from the laboratory [facility]. The [facility] gene panel offered by [employer] includes sequencing and deletion/duplication testing of the following 29 genes: APC, ATM, BARD1, BMPR1A, BRCA1, BRCA2, BRIP1, CHD1, CDK4, CDKN2A, CHEK2, EPCAM (large rearrangement only), HOXB13, MLH1, MSH2, MSH6, MUTYH, NBN, PALB2, PMS2, PTEN, RAD51C, RAD51D, SMAD4, STK11, and TP53. Sequencing was performed for select regions of POLE and POLD1, and large rearrangement analysis was performed for select regions of GREM1.  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 [id number] (p.[id number]).  The date of this test report is 12/12/2017.    01/08/2018 Tumor Marker   Patient's tumor was tested for the following markers: CA-125 Results of the tumor marker test revealed 17.5    03/02/2018 Tumor Marker   Patient's tumor was tested for the following markers: CA-125 Results of the tumor marker test revealed 11.4    03/14/2018 Imaging   MRCP 1. No worrisome pancreatic lesions are currently present. 2. Cholelithiasis. 3. Small benign-appearing bilateral renal cysts. 4. Midline anterior abdominal wall laxity seen on coronal images, potential small hernia in this vicinity without observed complication    12/13/7827 Procedure   Successful right IJ vein Port-A-Cath explant.    12/21/2019 Tumor Marker   Patient's tumor was tested for the following markers: CA-125 Results of the tumor marker test revealed 9.5.   06/19/2020 Tumor Marker   Patient's tumor  was tested for the following markers: CA-125 Results of the tumor marker test revealed 12.5   09/01/2020 Imaging   The BMD measured at Femur Neck Left is 0.866 g/cm2 with a T-score of -1.2. This patient is considered osteopenic according to [facility]) criteria   12/19/2020 Tumor Marker   Patient's tumor was tested for the  following markers: CA-125. Results of the tumor marker test revealed 10.6   07/04/2021 Imaging   1. No evidence of metastatic disease in the abdomen or pelvis. 2. Signs of hysterectomy and pelvic and abdominal lymphadenectomy. 3. Stable 9 mm low-attenuation focus along the margin of the RIGHT hepatic lobe, unchanged since September of 2018. Likely benign process such as atypical hemangioma. 4. Cholelithiasis 5. Colonic diverticulosis and diverticular disease mainly in the descending and proximal sigmoid colon. 6. Aortic atherosclerosis.   Aortic Atherosclerosis (ICD10-I70.0).       REVIEW OF SYSTEMS:   Constitutional: Denies fevers, chills or abnormal weight loss Eyes: Denies blurriness of vision Ears, nose, mouth, throat, and face: Denies mucositis or sore throat Respiratory: Denies cough, dyspnea or wheezes Cardiovascular: Denies palpitation, chest discomfort or lower extremity swelling Gastrointestinal:  Denies nausea, heartburn or change in bowel habits Skin: Denies abnormal skin rashes Lymphatics: Denies new lymphadenopathy or easy bruising Neurological:Denies numbness, tingling or new weaknesses Behavioral/Psych: Mood is stable, no new changes  All other systems were reviewed with the patient and are negative.  I have reviewed the past medical history, past surgical history, social history and family history with the patient and they are unchanged from previous note.  ALLERGIES:  is allergic to bee venom, chlorhexidine, paclitaxel, bacitracin, iodine, and other.  MEDICATIONS:  Current Outpatient Medications  Medication Sig Dispense Refill   amLODipine (NORVASC) 2.5 MG tablet Take 1 tablet (2.5 mg total) by mouth daily. 90 tablet 0   anastrozole (ARIMIDEX) 1 MG tablet TAKE 1 TABLET BY MOUTH EVERY DAY 90 tablet 8   Apple Cider Vinegar 600 MG CAPS Take 1,200 mg by mouth 2 (two) times daily. Takes in morning and in evening.     cetirizine (ZYRTEC) 10 MG tablet Take 10 mg by  mouth daily.     Cholecalciferol (VITAMIN D3) 125 MCG (5000 UT) CAPS Take 3,000 Units by mouth daily.      Cinnamon 500 MG TABS Take 2,000 mg by mouth 2 (two) times daily.     ibuprofen (ADVIL,MOTRIN) 200 MG tablet Take 400 mg by mouth every 6 (six) hours as needed.     Inulin (FIBER CHOICE PO) Take 10 g by mouth daily.     Liniments (SALONPAS PAIN RELIEF PATCH EX) Place 1 patch onto the skin daily as needed (knee pain).     Melatonin 5 MG TABS Take 5 mg by mouth daily.      Multiple Vitamins-Minerals (HAIR SKIN & NAILS ADVANCED PO) Take 1 tablet by mouth daily.     Turmeric 500 MG TABS Take 500 mg by mouth daily.     No current facility-administered medications for this visit.    PHYSICAL EXAMINATION: ECOG PERFORMANCE STATUS: 0 - Asymptomatic  Vitals:   07/05/21 1352  BP: 132/68  Pulse: 77  Resp: 18  Temp: 97.6 F (36.4 C)  SpO2: 100%   Filed Weights   07/05/21 1352  Weight: 204 lb 9.6 oz (92.8 kg)    GENERAL:alert, no distress and comfortable NEURO: alert & oriented x 3 with fluent speech, no focal motor/sensory deficits  LABORATORY DATA:  I have reviewed  the data as listed    Component Value Date/Time   NA 144 06/19/2021 0906   NA 143 04/11/2021 1157   NA 141 11/27/2017 0818   K 4.1 06/19/2021 0906   K 3.8 11/27/2017 0818   CL 107 06/19/2021 0906   CO2 26 06/19/2021 0906   CO2 25 11/27/2017 0818   GLUCOSE 105 (H) 06/19/2021 0906   GLUCOSE 90 11/27/2017 0818   BUN 13 06/19/2021 0906   BUN 18 04/11/2021 1157   BUN 12.9 11/27/2017 0818   CREATININE 0.93 06/19/2021 0906   CREATININE 0.8 11/27/2017 0818   CALCIUM 10.2 06/19/2021 0906   CALCIUM 9.9 11/27/2017 0818   PROT 7.4 06/19/2021 0906   PROT 7.5 04/11/2021 1157   PROT 7.0 11/27/2017 0818   ALBUMIN 4.1 06/19/2021 0906   ALBUMIN 5.0 (H) 04/11/2021 1157   ALBUMIN 3.8 11/27/2017 0818   AST 16 06/19/2021 0906   AST 11 11/27/2017 0818   ALT 18 06/19/2021 0906   ALT 11 11/27/2017 0818   ALKPHOS 74  06/19/2021 0906   ALKPHOS 68 11/27/2017 0818   BILITOT 0.9 06/19/2021 0906   BILITOT 0.6 04/11/2021 1157   BILITOT 0.41 11/27/2017 0818   GFRNONAA >60 06/19/2021 0906   GFRAA 77 11/09/2020 1005    No results found for: SPEP, UPEP  Lab Results  Component Value Date   WBC 6.7 06/19/2021   NEUTROABS 4.0 06/19/2021   HGB 13.9 06/19/2021   HCT 41.5 06/19/2021   MCV 86.5 06/19/2021   PLT 353 06/19/2021      Chemistry      Component Value Date/Time   NA 144 06/19/2021 0906   NA 143 04/11/2021 1157   NA 141 11/27/2017 0818   K 4.1 06/19/2021 0906   K 3.8 11/27/2017 0818   CL 107 06/19/2021 0906   CO2 26 06/19/2021 0906   CO2 25 11/27/2017 0818   BUN 13 06/19/2021 0906   BUN 18 04/11/2021 1157   BUN 12.9 11/27/2017 0818   CREATININE 0.93 06/19/2021 0906   CREATININE 0.8 11/27/2017 0818      Component Value Date/Time   CALCIUM 10.2 06/19/2021 0906   CALCIUM 9.9 11/27/2017 0818   ALKPHOS 74 06/19/2021 0906   ALKPHOS 68 11/27/2017 0818   AST 16 06/19/2021 0906   AST 11 11/27/2017 0818   ALT 18 06/19/2021 0906   ALT 11 11/27/2017 0818   BILITOT 0.9 06/19/2021 0906   BILITOT 0.6 04/11/2021 1157   BILITOT 0.41 11/27/2017 0818       RADIOGRAPHIC STUDIES: I have reviewed imaging study with the patient I have personally reviewed the radiological images as listed and agreed with the findings in the report. CT ABDOMEN PELVIS W CONTRAST  Result Date: 07/04/2021 CLINICAL DATA:  Ovarian cancer staging in a 60 year old female. EXAM: CT ABDOMEN AND PELVIS WITH CONTRAST TECHNIQUE: Multidetector CT imaging of the abdomen and pelvis was performed using the standard protocol following bolus administration of intravenous contrast. CONTRAST:  46mL OMNIPAQUE IOHEXOL 350 MG/ML SOLN COMPARISON:  Outside imaging from 2018 showing a pelvic mass. FINDINGS: Lower chest: Incidental imaging of the lung bases without effusion or sign of consolidative change. Hepatobiliary: Subtle low-attenuation  focus along the margin of the RIGHT hepatic lobe measuring approximately 9 mm is unchanged since September of 2018. Less conspicuous on delayed phase. No pericholecystic stranding. Suspected cholelithiasis. No gallbladder distension. No signs of biliary duct dilation. Portal vein is patent. Hepatic veins are patent. Pancreas: Signs of pancreatic atrophy without focal  lesion, ductal dilation or sign of inflammation. Spleen: Normal size and contour without focal lesion. Adrenals/Urinary Tract: Adrenal glands are normal. Symmetric renal enhancement. No hydronephrosis. Renal cysts. Smooth contour of the urinary bladder. Stomach/Bowel: No acute gastrointestinal process. Post appendectomy. Colonic diverticulosis and diverticular disease mainly in the descending and proximal sigmoid colon. Vascular/Lymphatic: Normal caliber of the abdominal aorta. Scattered aortic atherosclerosis. Smooth contour of the IVC. Post retroperitoneal and pelvic lymphadenectomy. No signs of pelvic or abdominal adenopathy. Reproductive: Post hysterectomy.  No adnexal masses. Other: No ascites. Musculoskeletal: No acute musculoskeletal process. Spinal degenerative changes. Degenerative changes in the hips. IMPRESSION: 1. No evidence of metastatic disease in the abdomen or pelvis. 2. Signs of hysterectomy and pelvic and abdominal lymphadenectomy. 3. Stable 9 mm low-attenuation focus along the margin of the RIGHT hepatic lobe, unchanged since September of 2018. Likely benign process such as atypical hemangioma. 4. Cholelithiasis 5. Colonic diverticulosis and diverticular disease mainly in the descending and proximal sigmoid colon. 6. Aortic atherosclerosis. Aortic Atherosclerosis (ICD10-I70.0). Electronically Signed   By: Zetta Bills M.D.   On: 07/04/2021 12:47

## 2021-07-05 NOTE — Assessment & Plan Note (Signed)
She is at high risk of breast cancer Based on current guidelines, she will need screening modality every 6 months, mammogram alternate with MRI She will be due for MRI November of this year I will get it scheduled closer to the time 

## 2021-07-05 NOTE — Assessment & Plan Note (Signed)
I have reviewed CT imaging with the patient She has no clinical signs of disease I recommend pelvic exam with Dr. Denman George, it is way past overdue Her last exam was in October of last year

## 2021-07-09 NOTE — Progress Notes (Signed)
Chief Complaint:   OBESITY Sierra Gray is here to discuss her progress with her obesity treatment plan along with follow-up of her obesity related diagnoses. Sierra Gray is on keeping a food journal and adhering to recommended goals of 1500 calories and 100 grams of protein and states she is following her eating plan approximately 0% of the time. Sierra Gray states she is Walking 60 minutes 5 times per week.  Today's visit was #: 18 Starting weight: 212 lbs Starting date: 07/11/2020 Today's weight: 201 lbs Today's date: 07/04/2021 Total lbs lost to date: 11 lbs Total lbs lost since last in-office visit: 0  Interim History: Sierra Gray was recently seen by Oncology on 06/19/2021.  She had a CT of the abdomen/pelvis performed yesterday.  She has history of left ovarian epithelial cancer.   She has follow-up with Oncology tomorrow.   Over the last several weeks, she has traveled to [place], [place], [place], and the [facility]).  Subjective:   1. Essential hypertension Blood pressure and heart rate are excellent at office visit.  She denies dizziness with position change or increased fatigue (which she had with higher doses of amlodipine). She is currently on Amlodipine 2.'5mg'$  QD.  BP Readings from Last 3 Encounters:  07/05/21 132/68  07/04/21 107/70  06/19/21 121/82   2. Pre-diabetes Sierra Gray has a diagnosis of prediabetes based on her elevated HgA1c and was informed this puts her at greater risk of developing diabetes. She continues to work on diet and exercise to decrease her risk of diabetes. She denies nausea or hypoglycemia.  On 04/11/2021, A1c was 5.8, and on 06/19/2021, blood glucose was 105.  Prefers to remain on metformin.  Lab Results  Component Value Date   HGBA1C 5.8 (H) 04/11/2021   Lab Results  Component Value Date   INSULIN 16.7 04/11/2021   INSULIN 23.9 11/09/2020   INSULIN 25.7 (H) 07/11/2020   Assessment/Plan:   1. Essential hypertension Sierra Gray is working on healthy  weight loss and exercise to improve blood pressure control. We will watch for signs of hypotension as she continues her lifestyle modifications.  Remain on amlodipine 2.5 mg daily.  No need for refill.  2. Pre-diabetes Sierra Gray will continue to work on weight loss, exercise, and decreasing simple carbohydrates to help decrease the risk of diabetes.  Increase protein and regular walking.  3. Class 1 obesity with serious comorbidity and body mass index (BMI) of 34.0 to 34.9 in adult, unspecified obesity type  Sierra Gray is currently in the action stage of change. As such, her goal is to continue with weight loss efforts. She has agreed to keeping a food journal and adhering to recommended goals of 1500 calories and 100 grams of protein.   Resume journaling - Handout:  High Protein/Low Carbohydrates, Recipes II Guide, Fruit Nutrition List.  Exercise goals:  As is.  Behavioral modification strategies: increasing lean protein intake, decreasing simple carbohydrates, meal planning and cooking strategies, keeping healthy foods in the home, planning for success, and keeping a strict food journal.  Sierra Gray has agreed to follow-up with our clinic in 3 weeks. She was informed of the importance of frequent follow-up visits to maximize her success with intensive lifestyle modifications for her multiple health conditions.   Objective:   Blood pressure 107/70, pulse 73, temperature 98.2 F (36.8 C), height '5\' 4"'$  (1.626 m), weight 201 lb (91.2 kg), SpO2 96 %. Body mass index is 34.5 kg/m.  General: Cooperative, alert, well developed, in no acute distress. HEENT: Conjunctivae and  lids unremarkable. Cardiovascular: Regular rhythm.  Lungs: Normal work of breathing. Neurologic: No focal deficits.   Lab Results  Component Value Date   CREATININE 0.93 06/19/2021   BUN 13 06/19/2021   NA 144 06/19/2021   K 4.1 06/19/2021   CL 107 06/19/2021   CO2 26 06/19/2021   Lab Results  Component Value Date   ALT 18  06/19/2021   AST 16 06/19/2021   ALKPHOS 74 06/19/2021   BILITOT 0.9 06/19/2021   Lab Results  Component Value Date   HGBA1C 5.8 (H) 04/11/2021   HGBA1C 5.6 11/09/2020   HGBA1C 5.6 07/11/2020   HGBA1C 5.5 09/04/2018   Lab Results  Component Value Date   INSULIN 16.7 04/11/2021   INSULIN 23.9 11/09/2020   INSULIN 25.7 (H) 07/11/2020   Lab Results  Component Value Date   TSH 0.285 (L) 07/11/2020   Lab Results  Component Value Date   CHOL 267 (H) 04/11/2021   HDL 51 04/11/2021   LDLCALC 188 (H) 04/11/2021   TRIG 154 (H) 04/11/2021   CHOLHDL 5.2 (H) 04/11/2021   Lab Results  Component Value Date   VD25OH 50.1 04/11/2021   VD25OH 46.0 11/09/2020   VD25OH 40.8 07/11/2020   Lab Results  Component Value Date   WBC 6.7 06/19/2021   HGB 13.9 06/19/2021   HCT 41.5 06/19/2021   MCV 86.5 06/19/2021   PLT 353 06/19/2021   Attestation Statements:   Reviewed by clinician on day of visit: allergies, medications, problem list, medical history, surgical history, family history, social history, and previous encounter notes.  Time spent on visit including pre-visit chart review and post-visit care and charting was 32 minutes.   I, Water quality scientist, CMA, am acting as Location manager for Mina Marble, NP.  I have reviewed the above documentation for accuracy and completeness, and I agree with the above. -  Lakyn Mantione d. Amirrah Quigley, NP-C

## 2021-07-24 ENCOUNTER — Ambulatory Visit (INDEPENDENT_AMBULATORY_CARE_PROVIDER_SITE_OTHER): Payer: 59 | Admitting: Adult Health

## 2021-07-30 ENCOUNTER — Telehealth: Payer: Self-pay

## 2021-07-30 NOTE — Telephone Encounter (Signed)
Called and left a message asking her to call the office back. Left times of appts on 11/14.

## 2021-07-30 NOTE — Telephone Encounter (Signed)
-----   Message from Heath Lark, MD sent at 07/30/2021  7:46 AM EDT ----- Can you help her schedule labs and MRI breast on 11/14, and see me on 11/17? Thanks

## 2021-08-01 ENCOUNTER — Other Ambulatory Visit: Payer: Self-pay | Admitting: Hematology and Oncology

## 2021-08-03 ENCOUNTER — Ambulatory Visit: Payer: 59 | Admitting: Gynecologic Oncology

## 2021-08-08 ENCOUNTER — Encounter: Payer: Self-pay | Admitting: Oncology

## 2021-08-08 ENCOUNTER — Encounter: Payer: Self-pay | Admitting: Gynecologic Oncology

## 2021-08-08 ENCOUNTER — Other Ambulatory Visit: Payer: Self-pay

## 2021-08-08 ENCOUNTER — Inpatient Hospital Stay: Payer: 59 | Attending: Hematology and Oncology | Admitting: Gynecologic Oncology

## 2021-08-08 VITALS — BP 141/85 | HR 75 | Temp 98.4°F | Resp 18 | Ht 64.0 in | Wt 199.3 lb

## 2021-08-08 DIAGNOSIS — Z8543 Personal history of malignant neoplasm of ovary: Secondary | ICD-10-CM | POA: Insufficient documentation

## 2021-08-08 DIAGNOSIS — Z9221 Personal history of antineoplastic chemotherapy: Secondary | ICD-10-CM | POA: Diagnosis not present

## 2021-08-08 DIAGNOSIS — Z1501 Genetic susceptibility to malignant neoplasm of breast: Secondary | ICD-10-CM | POA: Diagnosis not present

## 2021-08-08 DIAGNOSIS — Z148 Genetic carrier of other disease: Secondary | ICD-10-CM | POA: Diagnosis not present

## 2021-08-08 DIAGNOSIS — Z90722 Acquired absence of ovaries, bilateral: Secondary | ICD-10-CM | POA: Insufficient documentation

## 2021-08-08 DIAGNOSIS — C562 Malignant neoplasm of left ovary: Secondary | ICD-10-CM

## 2021-08-08 DIAGNOSIS — Z9079 Acquired absence of other genital organ(s): Secondary | ICD-10-CM | POA: Insufficient documentation

## 2021-08-08 DIAGNOSIS — Z90710 Acquired absence of both cervix and uterus: Secondary | ICD-10-CM | POA: Insufficient documentation

## 2021-08-08 NOTE — Progress Notes (Signed)
Follow-up Note: Gyn-Onc  Sierra Gray 60 y.o. 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 S/p adjuvant chemotherapy with 6 cycles carboplatin and paclitaxel completed 01/29/18.   Ovarian cancer history:  Complete clinical response. Recommendation is for continued 6 monthly surveillance visits until February, 2024. She will follow-up with [name], NP or Dr Delsa Sale for these visits as I am leaving the practice in October.  ATM mutation:  She sees Dr Alvy Bimler for prophylactic anastrazole for breast cancer risk reduction related to her ATM mutation. Pancreatic MRI for screening purposes was normal in April, 2019.  HPI: Patient was seen in consultation at the request of Dr. Nicoletta Dress.  Patient is a very pleasant 60 year old gravida 1 para 0 who in late August after having her [place] visit set up from the dental care and felt some abdominal discomfort. The pain increased and she subsequently that evening had a temperature of 102. This was corresponding with increased abdominal pain and then the abdominal pain dissipated.   She underwent a CT scan on 08/19/2017. It revealed a trace amount of ascites in the peritoneal cavity seen prior dominant weight in the lateral paracolic gutter and along the liver capsule. There was no nodule letter peritoneal enhancement identified to suggest peritoneal carcinomatosis. There really does state that there is a mass that is cystic and solid that replaces the entire uterus. It extends caudally to the cervix and appears to invade the upper one third of the vagina. The caudal margin of the neoplasm made directly invading the posterior bladder dome to the medial thickness is normal. No fat separate the mass from the adjacent upper third of the rectum or from the mid  sigmoid. The mass measures 12 x 13 x 20 cm in size and posteriorly and laterally displaces the right ovary. The left ovary was not competent to be identified. Of note the patient had undergone a laparoscopic hysterectomy at [facility] approximate 14 years ago secondary to fibroids.  On 09/11/17 she underwent ex lap, BSO, omentectomy, pelvic and para-aortic lymphadenectomy, radical tumor debulking. Intraoperative findings were significant for a 20+cm left ovarian mass with nromal right ovary and omentum. No extraovarian disease was seen. Frozen confirmed adenocarcinoma.  There was no gross residual disease at completion of the surgery.  Final pathology confirmed a 20cm grade 1 endometrioid left ovarian adenocarcinoma with surface involvement but negative washings (stage IC2).  All other resected tissue (including omentum, nodes and peritoneal biopsy and washings) were negative.  In accordance with NCCN guidelines she was recommended adjuvant carboplatin and paclitaxel chemotherapy and her case was discussed at multi-disciplinary tumor conference.  She went on to complete adjuvant chemotherapy with 6 cycles of carboplatin and paclitaxel in February, 2019. She tolerated therapy well and had a complete clinical response.   She presents today for follow-up. She had a ventral abdominal hernia for which she is had surgery at [facility] in June, 2019.  She underwent a laparoscopic assisted right hemicolectomy with Dr Hassell Done on 09/11/18. It showed benign tubular adenomas.  CA 125 was normal at 10 on 08/31/18.  CA 125 was normal at 9.7 on 11/21/19.  Interval Hx:  On 12/21/19 CA 125 was normal at 9.5. On 06/19/20 CA 125 was normal at 12.5.  On 12/19/20 CA 125 was normal at 10.6 On 06/19/21 CA 125 was normal at 10.4.  She has no symptoms concerning for recurrent cancer.  Review of Systems: Constitutional:  Denies fever.  Skin: no new lesions Cardiovascular: No chest pain, shortness of breath, or edema  Pulmonary:  No cough or wheeze.  Gastro Intestinal: Reporting intermittent lower abdominal soreness.  No nausea, vomiting, constipation, or diarrhea reported. No bright red blood per rectum or change in bowel movement.  Genitourinary: Denies vaginal bleeding and discharge.  Musculoskeletal: no masses   Psychology: No complaints  Current Meds:  Outpatient Encounter Medications as of 08/08/2021  Medication Sig   amLODipine (NORVASC) 2.5 MG tablet Take 1 tablet (2.5 mg total) by mouth daily.   anastrozole (ARIMIDEX) 1 MG tablet TAKE 1 TABLET BY MOUTH EVERY DAY   Apple Cider Vinegar 600 MG CAPS Take 1,200 mg by mouth 2 (two) times daily. Takes in morning and in evening.   Ascorbic Acid (VITAMIN C) 1000 MG tablet Take 1,000 mg by mouth daily.   Calcium Carb-Cholecalciferol (CALCIUM 1000 + D) 1000-800 MG-UNIT TABS Take by mouth.   cetirizine (ZYRTEC) 10 MG tablet Take 10 mg by mouth daily.   Cholecalciferol (VITAMIN D3) 125 MCG (5000 UT) CAPS Take 3,000 Units by mouth daily.    Cinnamon 500 MG TABS Take 2,000 mg by mouth 2 (two) times daily.   ibuprofen (ADVIL,MOTRIN) 200 MG tablet Take 400 mg by mouth every 6 (six) hours as needed.   Inulin (FIBER CHOICE PO) Take 10 g by mouth daily.   Liniments (SALONPAS PAIN RELIEF PATCH EX) Place 1 patch onto the skin daily as needed (knee pain).   Melatonin 5 MG TABS Take 5 mg by mouth daily.    Multiple Vitamins-Minerals (HAIR SKIN & NAILS ADVANCED PO) Take 1 tablet by mouth daily.   OVER THE COUNTER MEDICATION Prebiotic   Probiotic Product (PROBIOTIC DAILY PO) Take by mouth.   Turmeric 500 MG TABS Take 500 mg by mouth daily.   No facility-administered encounter medications on file as of 08/08/2021.    Allergy:  Allergies  Allergen Reactions   Bee Venom Anaphylaxis   Chlorhexidine Rash    Surgical prep wash   Paclitaxel Other (See Comments)    Experienced severe back pain and flushing   Bacitracin Rash   Iodine Rash    Not IV contrast, per patient. tkv    Other Rash    Ortho prep SURGICAL GLUE--RASH, VERY BOTHERSOME, REQUIRED BENADRYL     Social Hx:   Social History   Socioeconomic History   Marital status: Single    Spouse name: Not on file   Number of children: Not on file   Years of education: Not on file   Highest education level: Not on file  Occupational History   Occupation: Freight forwarder  Tobacco Use   Smoking status: Never   Smokeless tobacco: Never  Vaping Use   Vaping Use: Never used  Substance and Sexual Activity   Alcohol use: Yes    Comment: Socially drinks wine/beer   Drug use: No   Sexual activity: Never  Other Topics Concern   Not on file  Social History Narrative   Not on file   Social Determinants of Health   Financial Resource Strain: Not on file  Food Insecurity: Not on file  Transportation Needs: Not on file  Physical Activity: Not on file  Stress: Not on file  Social Connections: Not on file  Intimate Partner Violence: Not on file    Past Surgical Hx:  Past Surgical History:  Procedure Laterality Date  ABDOMINAL HYSTERECTOMY  2009   Total laparoscopic hyst for fibroids   BILATERAL SALPINGECTOMY Bilateral 09/11/2017   Procedure: BILATERAL SALPINGECTOMY WITH OOPHERECTOMY;  Surgeon: Everitt Amber, MD;  Location: [facility];  Service: Gynecology;  Laterality: Bilateral;   BREAST BIOPSY  12/16/2014   2 lumps in  right breast removed   BREAST LUMPECTOMY Left    BREAST REDUCTION SURGERY     EXCISION OF SKIN TAG N/A 09/11/2017   Procedure: EXCISION OF SKIN TAG ON THIGH;  Surgeon: Everitt Amber, MD;  Location: [facility];  Service: Gynecology;  Laterality: N/A;   IR FLUORO GUIDE PORT INSERTION RIGHT  11/03/2017   IR REMOVAL TUN ACCESS W/ PORT W/O FL MOD SED  03/17/2018   IR US GUIDE VASC ACCESS RIGHT  11/03/2017   KNEE SURGERY Right    x3   LAPAROSCOPIC PARTIAL RIGHT COLECTOMY Right 09/11/2018   Procedure: LAPAROSCOPIC ASSISTED  PARTIAL RIGHT HEMI-COLECTOMY ERAS PATHWAY;  Surgeon: Johnathan Hausen, MD;  Location: [facility];  Service: General;  Laterality: Right;   LAPAROTOMY Bilateral 09/11/2017   Procedure: EXPLORATORY LAPAROTOMY;  Surgeon: Everitt Amber, MD;  Location: [facility];  Service: Gynecology;  Laterality: Bilateral;   LAPAROTOMY WITH STAGING N/A 09/11/2017   Procedure: PELVIC AND PARA AORTIC LYMPH NODE  DISSECTION;  Surgeon: Everitt Amber, MD;  Location: [facility];  Service: Gynecology;  Laterality: N/A;   OMENTECTOMY N/A 09/11/2017   Procedure: OMENTECTOMY;  Surgeon: Everitt Amber, MD;  Location: [facility];  Service: Gynecology;  Laterality: N/A;   REDUCTION MAMMAPLASTY     WISDOM TOOTH EXTRACTION      Past Medical Hx:  Past Medical History:  Diagnosis Date   Anemia    hx of   Anemia    Anxiety    Arthritis    oa knees   Back pain    Breast lump in female    benign   Constipation    Depression    Family history of pancreatic cancer 11/27/2017   Family history of prostate cancer 11/27/2017   Fibroids    Hyperlipidemia    Hypertension    Joint pain    Migraine    hx of    Osteoarthritis    Ovarian cancer ([place])    Pelvic mass in female    Pelvic mass in female    PONV (postoperative nausea and vomiting)    nausea only   Sleep apnea    SOB (shortness of breath)    Swelling of both lower extremities    Vitamin D deficiency     Oncology Hx:  Oncology History Overview Note  Genetic testing came back positive for ATM variant   Left ovarian epithelial cancer ([place])  08/19/2017 Imaging   She had outside CT scan which showed large abdominal mass   08/27/2017 Tumor Marker   Patient's tumor was tested for the following markers: CA-125 Results of the tumor marker test revealed 904.5   09/11/2017 Pathology Results   1. 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 Soft tissue, biopsy, right medial thigh MATURE  LIPOMA 3. Ovary and fallopian tube, right ENDOMETRIOMA AND SIMPLE SEROUS CYST WITH FALLOPIAN TUBE ADHESION NEGATIVE FOR MALIGNANCY 4. Omentum, resection for tumor BENIGN OMENTUM NEGATIVE FOR CARCINOMA 5. Lymph nodes, regional resection, right pelvic EIGHT BENIGN LYMPH NODES (0/8) 6. Lymph nodes, regional resection, left pelvic SEVEN BENIGN  LYMPH NODES (0/7) 7. Lymph node, biopsy, right para-aortic FOUR BENIGN LYMPH NODES (0/4) 8. Lymph node, biopsy, left para-aortic ONE BENIGN LYMPH NODE (0/1) 9. Peritoneum, biopsy, left abdominal BENIGN FIBROMUSCULAR TISSUE 10. Peritoneum, biopsy, right abdominal BENIGN FIBROMUSCULAR TISSUE WITH SEROSITIS 11. Peritoneum, biopsy, pelvic FIBROADIPOSE TISSUE WITH SEROSITIS  Specimen(s): Ovary and fallopian tube Procedure: (including lymph node sampling): salpingo-oophorectomy Primary tumor site (including laterality): Left ovary Ovarian surface involvement: Yes Ovarian capsule intact without fragmentation: intact Maximum tumor size (cm): 20.0 cm Histologic type: Endometrioid carcinoma Grade: 1 Peritoneal implants: (specify invasive or non-invasive): Negative Pelvic extension (list additional structures on separate lines and if involved): Negative Lymph nodes: number examined 20 ; number positive 0 TNM code: pT1c2, pN0, pMx FIGO Stage (based on pathologic findings, needs clinical correlation): IC2   09/11/2017 Surgery   Preoperative Diagnosis: 1. left adnexal mass.   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    Procedure(s) Performed: 1. Exploratory laparotomy with bilateral salpingo-oophorectomy, pelvic and para-aortic lymph node dissection, omentectomy and radical debulking for ovarian cancer (CPT [phone])   Surgeon: Everitt Amber, M.D.  Operative Findings:20 cm left ovarian mass.   No intraperitoneal rupture occurred;  Frozen pathology was consistent with adenocarcinoma; right tube and ovary normal in  appearance; 3) normal bilateral pelvic and para-aortic lymph nodes and omentum; small and large bowel to palpation. This represented an optimal cytoreduction with no gross visible disease remaining.    10/09/2017 Tumor Marker   Patient's tumor was tested for the following markers: CA-125 Results of the tumor marker test revealed 68.7   10/15/2017 Adverse Reaction   She developed reaction to Paclitaxel.   10/15/2017 - 01/29/2018 Chemotherapy   She received carboplatin. Due to infusion reaction to Taxol with cycle 1, treatment was switched to carboplatin and Abraxane   11/03/2017 Procedure   Successful placement of a right internal jugular approach power injectable Port-A-Cath. The catheter is ready for immediate use.   11/27/2017 Tumor Marker   Patient's tumor was tested for the following markers: CA-125 Results of the tumor marker test revealed 27.7   12/12/2017 Genetic Testing   The patient had genetic testing due to a personal history of ovarian cancer and a family history of pancreatic cancer (and possibly breast cancer).  The [facility] Hereditary Cancer Panel + tumor HRD analysis was ordered from the laboratory [facility]. The [facility] gene panel offered by [employer] includes sequencing and deletion/duplication testing of the following 29 genes: APC, ATM, BARD1, BMPR1A, BRCA1, BRCA2, BRIP1, CHD1, CDK4, CDKN2A, CHEK2, EPCAM (large rearrangement only), HOXB13, MLH1, MSH2, MSH6, MUTYH, NBN, PALB2, PMS2, PTEN, RAD51C, RAD51D, SMAD4, STK11, and TP53. Sequencing was performed for select regions of POLE and POLD1, and large rearrangement analysis was performed for select regions of GREM1.  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 [id number] (p.[id number]).  The date of this test report is 12/12/2017.   01/08/2018 Tumor Marker   Patient's tumor was tested for the following markers: CA-125 Results of the tumor marker test revealed 17.5   03/02/2018 Tumor Marker    Patient's tumor was tested for the following markers: CA-125 Results of the tumor marker test revealed 11.4   03/14/2018 Imaging   MRCP 1. No worrisome pancreatic lesions are currently present. 2. Cholelithiasis. 3. Small benign-appearing bilateral renal cysts. 4. Midline anterior abdominal wall laxity seen on coronal images, potential small hernia in this vicinity without observed complication   04/09/7781 Procedure   Successful right IJ vein Port-A-Cath explant.  12/21/2019 Tumor Marker   Patient's tumor was tested for the following markers: CA-125 Results of the tumor marker test revealed 9.5.   06/19/2020 Tumor Marker   Patient's tumor was tested for the following markers: CA-125 Results of the tumor marker test revealed 12.5   09/01/2020 Imaging   The BMD measured at Femur Neck Left is 0.866 g/cm2 with a T-score of -1.2. This patient is considered osteopenic according to [facility]) criteria   12/19/2020 Tumor Marker   Patient's tumor was tested for the following markers: CA-125. Results of the tumor marker test revealed 10.6   07/04/2021 Imaging   1. No evidence of metastatic disease in the abdomen or pelvis. 2. Signs of hysterectomy and pelvic and abdominal lymphadenectomy. 3. Stable 9 mm low-attenuation focus along the margin of the RIGHT hepatic lobe, unchanged since September of 2018. Likely benign process such as atypical hemangioma. 4. Cholelithiasis 5. Colonic diverticulosis and diverticular disease mainly in the descending and proximal sigmoid colon. 6. Aortic atherosclerosis.   Aortic Atherosclerosis (ICD10-I70.0).       Family Hx:  Family History  Problem Relation Age of Onset   Hypertension Mother    Hyperlipidemia Mother    Thyroid disease Mother    Pancreatic cancer Father 56       died 35 months after dx   Hypertension Father        questionable   Prostate cancer Father 86       had surgery for it   Cancer Father    Depression  Father    Sleep apnea Father    Obesity Father    Breast cancer Maternal Aunt        dx 36's- she had surgery, think it was cancer but could have been just a lump removed   Heart disease Maternal Aunt    Lung cancer Maternal Aunt        primary   Heart attack Maternal Grandmother    Diabetes Maternal Grandfather    Dementia Maternal Grandfather    Osteoporosis Paternal Grandmother    Colon cancer Paternal Grandfather 69   Stomach cancer Neg Hx    Esophageal cancer Neg Hx     Vitals:  Blood pressure (!) 141/85, pulse 75, temperature 98.4 F (36.9 C), temperature source Oral, resp. rate 18, height _0  (1.626 m), weight 199 lb 4.7 oz (90.4 kg), SpO2 99 %.  Physical Exam: Well-nourished well-developed female in no acute distress.  Neck: Supple, no lymphadenopathy, no thyromegaly.  Lungs: Clear to auscultation bilaterally.  Cardiac: Regular rate and rhythm.  Abdomen: Obese, soft, nontender, nondistended.  Groins: No lymphadenopathy.  Extremities no edema.  Pelvic: no palpable masses in pelvis on vaginal bimanual exam.    Thereasa Solo, MD 08/08/2021, 5:49 PM

## 2021-08-08 NOTE — Progress Notes (Signed)
Notified Hannahmarie of lab and MRI apt in November and scheduled follow up with Dr. Alvy Bimler on 10/25/21 at 10:00.

## 2021-08-08 NOTE — Patient Instructions (Signed)
Please notify Dr Denman George at phone number [phone] if you notice vaginal bleeding, new pelvic or abdominal pains, bloating, feeling full easy, or a change in bladder or bowel function.   Dr Denman George is departing the [place] at [facility] in October, 2022. Her partners and colleagues including Dr Berline Lopes, Dr Delsa Sale and [name], Nurse Practitioner will be available to continue your care.   You are next scheduled to return to the Gynecologic Oncology office at the [facility] in February, 2023. Please call [phone] in December to request an appointment for February with Dr Serita Grit partner, Dr Delsa Sale or [name].

## 2021-08-13 ENCOUNTER — Encounter: Payer: Self-pay | Admitting: Gastroenterology

## 2021-08-14 ENCOUNTER — Ambulatory Visit (INDEPENDENT_AMBULATORY_CARE_PROVIDER_SITE_OTHER): Payer: 59 | Admitting: Adult Health

## 2021-08-14 ENCOUNTER — Encounter (INDEPENDENT_AMBULATORY_CARE_PROVIDER_SITE_OTHER): Payer: Self-pay | Admitting: Adult Health

## 2021-08-14 ENCOUNTER — Other Ambulatory Visit: Payer: Self-pay

## 2021-08-14 VITALS — BP 108/72 | HR 95 | Temp 99.0°F | Ht 64.0 in | Wt 200.0 lb

## 2021-08-14 DIAGNOSIS — I10 Essential (primary) hypertension: Secondary | ICD-10-CM

## 2021-08-14 DIAGNOSIS — E669 Obesity, unspecified: Secondary | ICD-10-CM

## 2021-08-14 DIAGNOSIS — R7303 Prediabetes: Secondary | ICD-10-CM

## 2021-08-14 DIAGNOSIS — Z6834 Body mass index (BMI) 34.0-34.9, adult: Secondary | ICD-10-CM

## 2021-08-14 NOTE — Progress Notes (Signed)
Chief Complaint:   OBESITY Sierra Gray is here to discuss her progress with her obesity treatment plan along with follow-up of her obesity related diagnoses. Sierra Gray is on keeping a food journal and adhering to recommended goals of 1500 calories and 100 grams of protein and states she is following her eating plan approximately 75% of the time. Sierra Gray states she is walking for 45 minutes 3-4 times per week.  Today's visit was #: 29 Starting weight: 212 lbs Starting date: 07/11/2020 Today's weight: 200 lbs Today's date: 08/14/2021 Total lbs lost to date: 12 lbs Total lbs lost since last in-office visit: 1 lb  Interim History: Sierra Gray recently recovered from COVID-71.  Home antigen COVID-19 test positive on 07/09/2021.   She denies any residual symptoms.  She will travel to [place] the last week in September- with her immediate family. Subjective:   1. Essential hypertension BP/HR stable, a little soft at OV.  She denies increased fatigue or dizziness with position change. She is on amlodipine 2.5 mg daily.  BP Readings from Last 3 Encounters:  08/14/21 108/72  08/08/21 (!) 141/85  07/05/21 132/68   2. Pre-diabetes On 04/11/2021, A1c 5.8  Due to difficulty of medication compliance, she only used metformin briefly and then stopped.  She prefers to remain off medication.  Lab Results  Component Value Date   HGBA1C 5.8 (H) 04/11/2021   Lab Results  Component Value Date   INSULIN 16.7 04/11/2021   INSULIN 23.9 11/09/2020   INSULIN 25.7 (H) 07/11/2020   Assessment/Plan:   1. Essential hypertension Continue daily amlodipine 2.5 mg.  Monitor for symptoms of hypotension.  Sierra Gray is working on healthy weight loss and exercise to improve blood pressure control. We will watch for signs of hypotension as she continues her lifestyle modifications.  2. Pre-diabetes Increase protein, continue daily walking. Sierra Gray will continue to work on weight loss, exercise, and decreasing simple  carbohydrates to help decrease the risk of diabetes.   3. Class 1 obesity with serious comorbidity and body mass index (BMI) of 34.0 to 34.9 in adult, unspecified obesity type  Sierra Gray is currently in the action stage of change. As such, her goal is to continue with weight loss efforts. She has agreed to practicing portion control and making smarter food choices, such as increasing vegetables and decreasing simple carbohydrates and following a lower carbohydrate, vegetable and lean protein rich diet plan.  Low carb for 3 weeks.  [place].  Exercise goals:  As is.  Behavioral modification strategies: increasing lean protein intake, decreasing simple carbohydrates, meal planning and cooking strategies, keeping healthy foods in the home, travel eating strategies, planning for success, and decreasing junk food.  Check fasting labs at next office visit.  Sierra Gray has agreed to follow-up with our clinic in 4 weeks, fasting. She was informed of the importance of frequent follow-up visits to maximize her success with intensive lifestyle modifications for her multiple health conditions.   Objective:   Blood pressure 108/72, pulse 95, temperature 99 F (37.2 C), height '5\' 4"'$  (1.626 m), weight 200 lb (90.7 kg), SpO2 96 %. Body mass index is 34.33 kg/m.  General: Cooperative, alert, well developed, in no acute distress. HEENT: Conjunctivae and lids unremarkable. Cardiovascular: Regular rhythm.  Lungs: Normal work of breathing. Neurologic: No focal deficits.   Lab Results  Component Value Date   CREATININE 0.93 06/19/2021   BUN 13 06/19/2021   NA 144 06/19/2021   K 4.1 06/19/2021   CL  107 06/19/2021   CO2 26 06/19/2021   Lab Results  Component Value Date   ALT 18 06/19/2021   AST 16 06/19/2021   ALKPHOS 74 06/19/2021   BILITOT 0.9 06/19/2021   Lab Results  Component Value Date   HGBA1C 5.8 (H) 04/11/2021   HGBA1C 5.6 11/09/2020   HGBA1C 5.6 07/11/2020   HGBA1C 5.5 09/04/2018    Lab Results  Component Value Date   INSULIN 16.7 04/11/2021   INSULIN 23.9 11/09/2020   INSULIN 25.7 (H) 07/11/2020   Lab Results  Component Value Date   TSH 0.285 (L) 07/11/2020   Lab Results  Component Value Date   CHOL 267 (H) 04/11/2021   HDL 51 04/11/2021   LDLCALC 188 (H) 04/11/2021   TRIG 154 (H) 04/11/2021   CHOLHDL 5.2 (H) 04/11/2021   Lab Results  Component Value Date   VD25OH 50.1 04/11/2021   VD25OH 46.0 11/09/2020   VD25OH 40.8 07/11/2020   Lab Results  Component Value Date   WBC 6.7 06/19/2021   HGB 13.9 06/19/2021   HCT 41.5 06/19/2021   MCV 86.5 06/19/2021   PLT 353 06/19/2021   Attestation Statements:   Reviewed by clinician on day of visit: allergies, medications, problem list, medical history, surgical history, family history, social history, and previous encounter notes.  Time spent on visit including pre-visit chart review and post-visit care and charting was 30 minutes.   I, Water quality scientist, CMA, am acting as Location manager for Mina Marble, NP.  I have reviewed the above documentation for accuracy and completeness, and I agree with the above. -  Ashlye Oviedo d. Alynna Hargrove, NP-C

## 2021-09-11 ENCOUNTER — Encounter (INDEPENDENT_AMBULATORY_CARE_PROVIDER_SITE_OTHER): Payer: Self-pay | Admitting: Adult Health

## 2021-09-11 ENCOUNTER — Ambulatory Visit (INDEPENDENT_AMBULATORY_CARE_PROVIDER_SITE_OTHER): Payer: 59 | Admitting: Adult Health

## 2021-09-11 ENCOUNTER — Other Ambulatory Visit: Payer: Self-pay

## 2021-09-11 VITALS — BP 114/77 | HR 95 | Temp 98.8°F | Ht 64.0 in | Wt 196.0 lb

## 2021-09-11 DIAGNOSIS — E782 Mixed hyperlipidemia: Secondary | ICD-10-CM

## 2021-09-11 DIAGNOSIS — Z9189 Other specified personal risk factors, not elsewhere classified: Secondary | ICD-10-CM

## 2021-09-11 DIAGNOSIS — E559 Vitamin D deficiency, unspecified: Secondary | ICD-10-CM

## 2021-09-11 DIAGNOSIS — I10 Essential (primary) hypertension: Secondary | ICD-10-CM | POA: Diagnosis not present

## 2021-09-11 DIAGNOSIS — R7303 Prediabetes: Secondary | ICD-10-CM | POA: Diagnosis not present

## 2021-09-11 DIAGNOSIS — Z6836 Body mass index (BMI) 36.0-36.9, adult: Secondary | ICD-10-CM

## 2021-09-11 MED ORDER — AMLODIPINE BESYLATE 2.5 MG PO TABS: 2.5000 mg | ORAL_TABLET | Freq: Every day | ORAL | 0 refills | Status: DC

## 2021-09-11 NOTE — Progress Notes (Signed)
Chief Complaint:   OBESITY Sierra Gray is here to discuss her progress with her obesity treatment plan along with follow-up of her obesity related diagnoses. [name] is on practicing portion control and making smarter food choices, such as increasing vegetables and decreasing simple carbohydrates, low carbohydrate and states she is following her eating plan approximately 75% of the time. Vern states she is walking for 45-60 minutes 3-4 times per week.  Today's visit was #: 20 Starting weight: 212 lbs Starting date: 07/11/2020 Today's weight: 196 lbs Today's date: 09/11/2021 Total lbs lost to date: 16 lbs Total lbs lost since last in-office visit: 4 lbs  Interim History: [name] says she has been in [place] and [place] over the last 2 weeks. Snacks at the beach - oreos, apple with peanut butter, scones, cheese - n - crackers. Enjoyed seafood frequently with exceptional amount of walking on the beach.  Of note:  She does not have a current PCP. Local PCP office information provided today.  Subjective:   1. Essential hypertension She has titrated down to 2.5 mg amlodipine daily.  Denies edema of lower extremities.  2. Mixed hyperlipidemia Her mother has HLD - mother is statin intolerant. She denies cardiac symptoms. She denies tobacco/vape use.   Discussed risks/benefits of statin therapy.   She does not have a PCP currently.  3. Vitamin D deficiency She is on combined OTC vitamin D3 5,000 IU daily.  4. Pre-diabetes She was unable to remember daily dosing of metformin - been off since late summer 2022.  5. At risk for heart disease Sierra Gray is at a higher than average risk for cardiovascular disease due to hyperlipidemia and obesity.    Assessment/Plan:   1. Essential hypertension Monitor ambulatory BP. Refill amlodipine 2.5 mg daily, as per below. Check labs today.  - Refill amLODipine (NORVASC) 2.5 MG tablet; Take 1 tablet (2.5 mg total) by mouth daily.  Dispense: 90  tablet; Refill: 0 - Comprehensive metabolic panel  2. Mixed hyperlipidemia Check labs today.  - Lipid panel  3. Vitamin D deficiency Will check vitamin D level today, as per below.  - VITAMIN D 25 Hydroxy (Vit-D Deficiency, Fractures)  4. Pre-diabetes Check labs today.  - Hemoglobin A1c - Insulin, random  5. At risk for heart disease Sierra Gray was given approximately 15 minutes of coronary artery disease prevention counseling today. She is 60 y.o. female and has risk factors for heart disease including obesity. We discussed intensive lifestyle modifications today with an emphasis on specific weight loss instructions and strategies.   Repetitive spaced learning was employed today to elicit superior memory formation and behavioral change.   6. Obesity with current BMI 33.7  Sierra Gray is currently in the action stage of change. As such, her goal is to continue with weight loss efforts. She has agreed to following a lower carbohydrate, vegetable and lean protein rich diet plan.   Exercise goals:  As is.  Behavioral modification strategies: increasing lean protein intake, decreasing simple carbohydrates, increasing water intake, meal planning and cooking strategies, keeping healthy foods in the home, and planning for success.  Sierra Gray has agreed to follow-up with our clinic in 4 weeks. She was informed of the importance of frequent follow-up visits to maximize her success with intensive lifestyle modifications for her multiple health conditions.   Sierra Gray was informed we would discuss her lab results at her next visit unless there is a critical issue that needs to be addressed sooner. [name] agreed to keep her next  visit at the agreed upon time to discuss these results.  Objective:   Blood pressure 114/77, pulse 95, temperature 98.8 F (37.1 C), height 5\' 4"  (1.626 m), weight 196 lb (88.9 kg), SpO2 96 %. Body mass index is 33.64 kg/m.  General: Cooperative, alert, well developed, in no acute  distress. HEENT: Conjunctivae and lids unremarkable. Cardiovascular: Regular rhythm.  Lungs: Normal work of breathing. Neurologic: No focal deficits.   Lab Results  Component Value Date   CREATININE 0.93 06/19/2021   BUN 13 06/19/2021   NA 144 06/19/2021   K 4.1 06/19/2021   CL 107 06/19/2021   CO2 26 06/19/2021   Lab Results  Component Value Date   ALT 18 06/19/2021   AST 16 06/19/2021   ALKPHOS 74 06/19/2021   BILITOT 0.9 06/19/2021   Lab Results  Component Value Date   HGBA1C 5.8 (H) 04/11/2021   HGBA1C 5.6 11/09/2020   HGBA1C 5.6 07/11/2020   HGBA1C 5.5 09/04/2018   Lab Results  Component Value Date   INSULIN 16.7 04/11/2021   INSULIN 23.9 11/09/2020   INSULIN 25.7 (H) 07/11/2020   Lab Results  Component Value Date   TSH 0.285 (L) 07/11/2020   Lab Results  Component Value Date   CHOL 267 (H) 04/11/2021   HDL 51 04/11/2021   LDLCALC 188 (H) 04/11/2021   TRIG 154 (H) 04/11/2021   CHOLHDL 5.2 (H) 04/11/2021   Lab Results  Component Value Date   VD25OH 50.1 04/11/2021   VD25OH 46.0 11/09/2020   VD25OH 40.8 07/11/2020   Lab Results  Component Value Date   WBC 6.7 06/19/2021   HGB 13.9 06/19/2021   HCT 41.5 06/19/2021   MCV 86.5 06/19/2021   PLT 353 06/19/2021   Attestation Statements:   Reviewed by clinician on day of visit: allergies, medications, problem list, medical history, surgical history, family history, social history, and previous encounter notes.  I, Water quality scientist, CMA, am acting as Location manager for Mina Marble, NP.  I have reviewed the above documentation for accuracy and completeness, and I agree with the above. -  Brinna Divelbiss d. Tess Potts, NP-C

## 2021-09-12 LAB — COMPREHENSIVE METABOLIC PANEL
ALT: 22 IU/L (ref 0–32)
AST: 20 IU/L (ref 0–40)
Albumin/Globulin Ratio: 2 (ref 1.2–2.2)
Albumin: 5.1 g/dL — ABNORMAL HIGH (ref 3.8–4.9)
Alkaline Phosphatase: 87 IU/L (ref 44–121)
BUN/Creatinine Ratio: 19 (ref 9–23)
BUN: 18 mg/dL (ref 6–24)
Bilirubin Total: 0.6 mg/dL (ref 0.0–1.2)
CO2: 20 mmol/L (ref 20–29)
Calcium: 10.6 mg/dL — ABNORMAL HIGH (ref 8.7–10.2)
Chloride: 102 mmol/L (ref 96–106)
Creatinine, Ser: 0.97 mg/dL (ref 0.57–1.00)
Globulin, Total: 2.5 g/dL (ref 1.5–4.5)
Glucose: 104 mg/dL — ABNORMAL HIGH (ref 70–99)
Potassium: 5 mmol/L (ref 3.5–5.2)
Sodium: 141 mmol/L (ref 134–144)
Total Protein: 7.6 g/dL (ref 6.0–8.5)
eGFR: 67 mL/min/{1.73_m2} (ref >59)

## 2021-09-12 LAB — INSULIN, RANDOM: INSULIN: 27.4 u[IU]/mL — ABNORMAL HIGH (ref 2.6–24.9)

## 2021-09-12 LAB — LIPID PANEL
Chol/HDL Ratio: 5.1 ratio — ABNORMAL HIGH (ref 0.0–4.4)
Cholesterol, Total: 259 mg/dL — ABNORMAL HIGH (ref 100–199)
HDL: 51 mg/dL (ref >39)
LDL Chol Calc (NIH): 185 mg/dL — ABNORMAL HIGH (ref 0–99)
Triglycerides: 127 mg/dL (ref 0–149)
VLDL Cholesterol Cal: 23 mg/dL (ref 5–40)

## 2021-09-12 LAB — HEMOGLOBIN A1C
Est. average glucose Bld gHb Est-mCnc: 111 mg/dL
Hgb A1c MFr Bld: 5.5 % (ref 4.8–5.6)

## 2021-09-12 LAB — VITAMIN D 25 HYDROXY (VIT D DEFICIENCY, FRACTURES): Vit D, 25-Hydroxy: 58.5 ng/mL (ref 30.0–100.0)

## 2021-10-09 ENCOUNTER — Other Ambulatory Visit: Payer: Self-pay

## 2021-10-09 ENCOUNTER — Ambulatory Visit (INDEPENDENT_AMBULATORY_CARE_PROVIDER_SITE_OTHER): Payer: 59 | Admitting: Adult Health

## 2021-10-09 ENCOUNTER — Encounter (INDEPENDENT_AMBULATORY_CARE_PROVIDER_SITE_OTHER): Payer: Self-pay | Admitting: Adult Health

## 2021-10-09 VITALS — BP 135/76 | HR 80 | Temp 98.4°F | Ht 64.0 in | Wt 201.0 lb

## 2021-10-09 DIAGNOSIS — E782 Mixed hyperlipidemia: Secondary | ICD-10-CM | POA: Diagnosis not present

## 2021-10-09 DIAGNOSIS — E559 Vitamin D deficiency, unspecified: Secondary | ICD-10-CM | POA: Diagnosis not present

## 2021-10-09 DIAGNOSIS — R7303 Prediabetes: Secondary | ICD-10-CM

## 2021-10-09 DIAGNOSIS — I10 Essential (primary) hypertension: Secondary | ICD-10-CM | POA: Diagnosis not present

## 2021-10-09 DIAGNOSIS — Z6836 Body mass index (BMI) 36.0-36.9, adult: Secondary | ICD-10-CM

## 2021-10-09 NOTE — Progress Notes (Signed)
Chief Complaint:   OBESITY Sierra Gray is here to discuss her progress with her obesity treatment plan along with follow-up of her obesity related diagnoses. Sierra Gray is on practicing portion control and making smarter food choices, such as increasing vegetables and decreasing simple carbohydrates and states she is following her eating plan approximately 0% of the time. Sierra Gray states she is not exercising regularly at this time.  Today's visit was #: 21 Starting weight: 212 lbs Starting date: 07/11/2020 Today's weight: 201 lbs Today's date: 10/09/2021 Total lbs lost to date: 11 lbs Total lbs lost since last in-office visit: 0  Interim History: Sierra Gray will find a PCP after the new year with new insurance. She has not been on her prescribed eating plan over the last few weeks.  Subjective:   1. Pre-diabetes Discussed labs with patient today.  On 09/11/2021, BG 104, A1c 5.5, insulin 27.4. Unable to remember to take metformin daily.  2. Essential hypertension Discussed labs with patient today.  On 09/11/2021, CMP - stable.  3. Mixed hyperlipidemia Discussed labs with patient today.  Mother has hyperlipidemia - statin intolerant. On 09/11/2021, lipid panel - total/LDL both above goal. ASCVD 5.8% - elevated.  She does not currently have a PCP. She denies acute cardiac sx's. She denies tobacco/vape use. Discussed risks/benefits of lifestyle changes versus statin therapy.  4. Vitamin D deficiency Discussed labs with patient today.  On 09/11/2021, vitamin D level - 58.5 - at goal. She is currently taking OTC calcium and D and OTC vitamin D3 5,000 IU each day.   Assessment/Plan:   1. Pre-diabetes Decrease carbohydrates, increase protein, increase exercise.  2. Essential hypertension Continue amlodipine 2.5 mg daily.  3. Mixed hyperlipidemia Decrease saturated fat, increase walking. Check labs every 3 months.  If no improvement, consider statin therapy at next lipid panel.  4. Vitamin  D deficiency Continue current OTC supplement.  5. Obesity with current BMI 34.6  Sierra Gray is currently in the action stage of change. As such, her goal is to continue with weight loss efforts. She has agreed to the Category 3 Plan.   Restart Category 3 - forms.  Exercise goals:  Walk for 10 minutes 2 times per week.  Behavioral modification strategies: increasing lean protein intake, decreasing simple carbohydrates, meal planning and cooking strategies, keeping healthy foods in the home, and planning for success.  Sierra Gray has agreed to follow-up with our clinic in 4 weeks. She was informed of the importance of frequent follow-up visits to maximize her success with intensive lifestyle modifications for her multiple health conditions.   Objective:   Blood pressure 135/76, pulse 80, temperature 98.4 F (36.9 C), height 5\' 4"  (1.626 m), weight 201 lb (91.2 kg), SpO2 97 %. Body mass index is 34.5 kg/m.  General: Cooperative, alert, well developed, in no acute distress. HEENT: Conjunctivae and lids unremarkable. Cardiovascular: Regular rhythm.  Lungs: Normal work of breathing. Neurologic: No focal deficits.   Lab Results  Component Value Date   CREATININE 0.97 09/11/2021   BUN 18 09/11/2021   NA 141 09/11/2021   K 5.0 09/11/2021   CL 102 09/11/2021   CO2 20 09/11/2021   Lab Results  Component Value Date   ALT 22 09/11/2021   AST 20 09/11/2021   ALKPHOS 87 09/11/2021   BILITOT 0.6 09/11/2021   Lab Results  Component Value Date   HGBA1C 5.5 09/11/2021   HGBA1C 5.8 (H) 04/11/2021   HGBA1C 5.6 11/09/2020   HGBA1C 5.6 07/11/2020   HGBA1C  5.5 09/04/2018   Lab Results  Component Value Date   INSULIN 27.4 (H) 09/11/2021   INSULIN 16.7 04/11/2021   INSULIN 23.9 11/09/2020   INSULIN 25.7 (H) 07/11/2020   Lab Results  Component Value Date   TSH 0.285 (L) 07/11/2020   Lab Results  Component Value Date   CHOL 259 (H) 09/11/2021   HDL 51 09/11/2021   LDLCALC 185 (H)  09/11/2021   TRIG 127 09/11/2021   CHOLHDL 5.1 (H) 09/11/2021   Lab Results  Component Value Date   VD25OH 58.5 09/11/2021   VD25OH 50.1 04/11/2021   VD25OH 46.0 11/09/2020   Lab Results  Component Value Date   WBC 6.7 06/19/2021   HGB 13.9 06/19/2021   HCT 41.5 06/19/2021   MCV 86.5 06/19/2021   PLT 353 06/19/2021   Attestation Statements:   Reviewed by clinician on day of visit: allergies, medications, problem list, medical history, surgical history, family history, social history, and previous encounter notes.  Time spent on visit including pre-visit chart review and post-visit care and charting was 32 minutes.   I, Water quality scientist, CMA, am acting as Location manager for Mina Marble, NP.  I have reviewed the above documentation for accuracy and completeness, and I agree with the above. -  Izik Bingman d. Ryley Teater, NP-C

## 2021-10-22 ENCOUNTER — Other Ambulatory Visit: Payer: Self-pay

## 2021-10-22 ENCOUNTER — Ambulatory Visit (HOSPITAL_COMMUNITY): Admission: RE | Admit: 2021-10-22 | Payer: 59 | Source: Ambulatory Visit

## 2021-10-22 ENCOUNTER — Ambulatory Visit (HOSPITAL_COMMUNITY)
Admission: RE | Admit: 2021-10-22 | Discharge: 2021-10-22 | Disposition: A | Discharge status: Home / Self Care | Payer: 59 | Source: Ambulatory Visit | Attending: Hematology and Oncology | Admitting: Hematology and Oncology

## 2021-10-22 ENCOUNTER — Inpatient Hospital Stay: Payer: 59 | Attending: Hematology and Oncology

## 2021-10-22 DIAGNOSIS — Z1501 Genetic susceptibility to malignant neoplasm of breast: Secondary | ICD-10-CM | POA: Insufficient documentation

## 2021-10-22 DIAGNOSIS — Z1589 Genetic susceptibility to other disease: Secondary | ICD-10-CM | POA: Diagnosis present

## 2021-10-22 DIAGNOSIS — Z1509 Genetic susceptibility to other malignant neoplasm: Secondary | ICD-10-CM | POA: Insufficient documentation

## 2021-10-22 DIAGNOSIS — C562 Malignant neoplasm of left ovary: Secondary | ICD-10-CM

## 2021-10-22 DIAGNOSIS — Z8543 Personal history of malignant neoplasm of ovary: Secondary | ICD-10-CM | POA: Insufficient documentation

## 2021-10-22 LAB — CBC WITH DIFFERENTIAL/PLATELET
Abs Immature Granulocytes: 0.01 10*3/uL (ref 0.00–0.07)
Basophils Absolute: 0.1 10*3/uL (ref 0.0–0.1)
Basophils Relative: 1 %
Eosinophils Absolute: 0.3 10*3/uL (ref 0.0–0.5)
Eosinophils Relative: 5 %
HCT: 40 % (ref 36.0–46.0)
Hemoglobin: 12.8 g/dL (ref 12.0–15.0)
Immature Granulocytes: 0 %
Lymphocytes Relative: 27 %
Lymphs Abs: 1.8 10*3/uL (ref 0.7–4.0)
MCH: 28.1 pg (ref 26.0–34.0)
MCHC: 32 g/dL (ref 30.0–36.0)
MCV: 87.9 fL (ref 80.0–100.0)
Monocytes Absolute: 0.6 10*3/uL (ref 0.1–1.0)
Monocytes Relative: 9 %
Neutro Abs: 3.7 10*3/uL (ref 1.7–7.7)
Neutrophils Relative %: 58 %
Platelets: 339 10*3/uL (ref 150–400)
RBC: 4.55 MIL/uL (ref 3.87–5.11)
RDW: 14 % (ref 11.5–15.5)
WBC: 6.4 10*3/uL (ref 4.0–10.5)
nRBC: 0 % (ref 0.0–0.2)

## 2021-10-22 LAB — COMPREHENSIVE METABOLIC PANEL
ALT: 17 U/L (ref 0–44)
AST: 16 U/L (ref 15–41)
Albumin: 3.9 g/dL (ref 3.5–5.0)
Alkaline Phosphatase: 61 U/L (ref 38–126)
Anion gap: 11 (ref 5–15)
BUN: 14 mg/dL (ref 6–20)
CO2: 24 mmol/L (ref 22–32)
Calcium: 9.8 mg/dL (ref 8.9–10.3)
Chloride: 106 mmol/L (ref 98–111)
Creatinine, Ser: 0.86 mg/dL (ref 0.44–1.00)
GFR, Estimated: >60 mL/min (ref >60)
Glucose, Bld: 111 mg/dL — ABNORMAL HIGH (ref 70–99)
Potassium: 4.3 mmol/L (ref 3.5–5.1)
Sodium: 141 mmol/L (ref 135–145)
Total Bilirubin: 0.6 mg/dL (ref 0.3–1.2)
Total Protein: 7 g/dL (ref 6.5–8.1)

## 2021-10-22 MED ORDER — GADOBUTROL 1 MMOL/ML IV SOLN
10.0000 mL | Freq: Once | INTRAVENOUS | Status: AC | PRN
  Administered 2021-10-22: 10 mL via INTRAVENOUS

## 2021-10-23 ENCOUNTER — Encounter: Payer: Self-pay | Admitting: Hematology and Oncology

## 2021-10-23 ENCOUNTER — Inpatient Hospital Stay (HOSPITAL_BASED_OUTPATIENT_CLINIC_OR_DEPARTMENT_OTHER): Payer: 59 | Admitting: Hematology and Oncology

## 2021-10-23 VITALS — BP 135/67 | HR 92 | Temp 97.5°F | Resp 18 | Ht 64.0 in | Wt 209.2 lb

## 2021-10-23 DIAGNOSIS — Z800 Family history of malignant neoplasm of digestive organs: Secondary | ICD-10-CM | POA: Diagnosis not present

## 2021-10-23 DIAGNOSIS — Z1501 Genetic susceptibility to malignant neoplasm of breast: Secondary | ICD-10-CM | POA: Diagnosis not present

## 2021-10-23 DIAGNOSIS — C562 Malignant neoplasm of left ovary: Secondary | ICD-10-CM

## 2021-10-23 DIAGNOSIS — Z8543 Personal history of malignant neoplasm of ovary: Secondary | ICD-10-CM | POA: Diagnosis present

## 2021-10-23 DIAGNOSIS — Z1589 Genetic susceptibility to other disease: Secondary | ICD-10-CM | POA: Diagnosis not present

## 2021-10-23 DIAGNOSIS — Z1509 Genetic susceptibility to other malignant neoplasm: Secondary | ICD-10-CM

## 2021-10-23 LAB — CA 125: Cancer Antigen (CA) 125: 11.6 U/mL (ref 0.0–38.1)

## 2021-10-23 NOTE — Assessment & Plan Note (Signed)
The patient remained high risk for pancreatic cancer and breast cancer MRI of the breast is pending but by my independent review, there is nothing new She will continue mammograms alternate with MRI every 6 months, her next breast imaging would be in May 2023 with screening mammogram Her last CT imaging of the abdomen was in July 2022, normal Observe closely I plan CT imaging once a year, due next in July 2023

## 2021-10-23 NOTE — Progress Notes (Signed)
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[place]) Her last CT imaging show no evidence of disease Tumor marker is normal and she is not symptomatic Her previous GYN surgeon has left I will get her scheduled to see another physician next year  Monoallelic mutation of ATM gene The patient remained high risk for pancreatic cancer and breast cancer MRI of the breast is pending but by my independent review, there is nothing new She will continue mammograms alternate with MRI every 6 months, her next breast imaging would be in May 2023 with screening mammogram Her last CT imaging of the abdomen was in July 2022, normal Observe closely I plan CT imaging once a year, due next in July 2023  Orders Placed This Encounter  Procedures   CT ABDOMEN PELVIS W CONTRAST    Standing Status:   Future    Standing Expiration Date:   10/23/2022    Order Specific Question:   If indicated for the ordered procedure, I authorize the administration of contrast media per Radiology protocol    Answer:   Yes    Order Specific Question:   Preferred imaging location?    Answer:   [facility]    Order Specific Question:   Radiology Contrast Protocol - do NOT remove file path    Answer:   [email or web address]    Order Specific Question:   Is patient pregnant?    Answer:   No    All questions were answered. The patient knows to call the clinic with any problems, questions or concerns. The total time spent in the appointment was 20 minutes encounter with patients including review of chart and various tests results, discussions about plan of care and coordination of care plan   Heath Lark, MD 10/23/2021 9:48 AM  INTERVAL HISTORY: Please see below for problem oriented charting. she returns for surveillance follow-up for  history of ovarian cancer, positive family history of pancreatic cancer and ATM gene mutation positive She denies abnormal breast lumps No abdominal pain, changes in bowel habits or bloating  REVIEW OF SYSTEMS:   Constitutional: Denies fevers, chills or abnormal weight loss Eyes: Denies blurriness of vision Ears, nose, mouth, throat, and face: Denies mucositis or sore throat Respiratory: Denies cough, dyspnea or wheezes Cardiovascular: Denies palpitation, chest discomfort or lower extremity swelling Gastrointestinal:  Denies nausea, heartburn or change in bowel habits Skin: Denies abnormal skin rashes Lymphatics: Denies new lymphadenopathy or easy bruising Neurological:Denies numbness, tingling or new weaknesses Behavioral/Psych: Mood is stable, no new changes  All other systems were reviewed with the patient and are negative.  I have reviewed the past medical history, past surgical history, social history and family history with the patient and they are unchanged from previous note.  ALLERGIES:  is allergic to bee venom, chlorhexidine, paclitaxel, bacitracin, iodine, and other.  MEDICATIONS:  Current Outpatient Medications  Medication Sig Dispense Refill   amLODipine (NORVASC) 2.5 MG tablet Take 1 tablet (2.5 mg total) by mouth daily. 90 tablet 0   anastrozole (ARIMIDEX) 1 MG tablet TAKE 1 TABLET BY MOUTH EVERY DAY 90 tablet 8   Apple Cider Vinegar 600 MG CAPS Take 1,200 mg by mouth 2 (two) times daily. Takes in morning and in evening.     Ascorbic Acid (VITAMIN C) 1000 MG tablet Take 1,000 mg by mouth daily.     Calcium  Carb-Cholecalciferol (CALCIUM 1000 + D) 1000-800 MG-UNIT TABS Take by mouth.     cetirizine (ZYRTEC) 10 MG tablet Take 10 mg by mouth daily.     Cholecalciferol (VITAMIN D3) 125 MCG (5000 UT) CAPS Take 3,000 Units by mouth daily.      Cinnamon 500 MG TABS Take 2,000 mg by mouth 2 (two) times daily.     ibuprofen (ADVIL,MOTRIN) 200 MG tablet Take 400 mg by mouth every  6 (six) hours as needed.     Inulin (FIBER CHOICE PO) Take 10 g by mouth daily.     Liniments (SALONPAS PAIN RELIEF PATCH EX) Place 1 patch onto the skin daily as needed (knee pain).     Melatonin 5 MG TABS Take 5 mg by mouth daily.      Multiple Vitamins-Minerals (HAIR SKIN & NAILS ADVANCED PO) Take 1 tablet by mouth daily.     OVER THE COUNTER MEDICATION Prebiotic     Probiotic Product (PROBIOTIC DAILY PO) Take by mouth.     Turmeric 500 MG TABS Take 500 mg by mouth daily.     Vitamin E 100 units TABS Take by mouth.     No current facility-administered medications for this visit.    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 Soft tissue, biopsy, right medial thigh MATURE LIPOMA 3. Ovary and fallopian tube, right ENDOMETRIOMA AND SIMPLE SEROUS CYST WITH FALLOPIAN TUBE ADHESION NEGATIVE FOR MALIGNANCY 4. Omentum, resection for tumor BENIGN OMENTUM NEGATIVE FOR CARCINOMA 5. Lymph nodes, regional resection, right pelvic EIGHT BENIGN LYMPH NODES (0/8) 6. Lymph nodes, regional resection, left pelvic SEVEN BENIGN LYMPH NODES (0/7) 7. Lymph node, biopsy, right para-aortic FOUR BENIGN LYMPH NODES (0/4) 8. Lymph node, biopsy, left para-aortic ONE BENIGN LYMPH NODE (0/1) 9. Peritoneum, biopsy, left abdominal BENIGN FIBROMUSCULAR TISSUE 10. Peritoneum, biopsy, right abdominal BENIGN FIBROMUSCULAR TISSUE  WITH SEROSITIS 11. Peritoneum, biopsy, pelvic FIBROADIPOSE TISSUE WITH SEROSITIS  Specimen(s): Ovary and fallopian tube Procedure: (including lymph node sampling): salpingo-oophorectomy Primary tumor site (including laterality): Left ovary Ovarian surface involvement: Yes Ovarian capsule intact without fragmentation: intact Maximum tumor size (cm): 20.0 cm Histologic type: Endometrioid carcinoma Grade: 1 Peritoneal implants: (specify invasive or non-invasive): Negative Pelvic extension (list additional structures on separate lines and if involved): Negative Lymph nodes: number examined 20 ; number positive 0 TNM code: pT1c2, pN0, pMx FIGO Stage (based on pathologic findings, needs clinical correlation): IC2   09/11/2017 Surgery   Preoperative Diagnosis: 1. left adnexal mass.   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    Procedure(s) Performed: 1. Exploratory laparotomy with bilateral salpingo-oophorectomy, pelvic and para-aortic lymph node dissection, omentectomy and radical debulking for ovarian cancer (CPT [phone])   Surgeon: Everitt Amber, M.D.  Operative Findings:20 cm left ovarian mass.   No intraperitoneal rupture occurred;  Frozen pathology was consistent with adenocarcinoma; right tube and ovary normal in appearance; 3) normal bilateral pelvic and para-aortic lymph nodes  and omentum; small and large bowel to palpation. This represented an optimal cytoreduction with no gross visible disease remaining.    10/09/2017 Tumor Marker   Patient's tumor was tested for the following markers: CA-125 Results of the tumor marker test revealed 68.7   10/15/2017 Adverse Reaction   She developed reaction to Paclitaxel.   10/15/2017 - 01/29/2018 Chemotherapy   She received carboplatin. Due to infusion reaction to Taxol with cycle 1, treatment was switched to carboplatin and Abraxane   11/03/2017 Procedure   Successful placement of a right internal  jugular approach power injectable Port-A-Cath. The catheter is ready for immediate use.   11/27/2017 Tumor Marker   Patient's tumor was tested for the following markers: CA-125 Results of the tumor marker test revealed 27.7   12/12/2017 Genetic Testing   The patient had genetic testing due to a personal history of ovarian cancer and a family history of pancreatic cancer (and possibly breast cancer).  The [facility] Hereditary Cancer Panel + tumor HRD analysis was ordered from the laboratory [facility]. The [facility] gene panel offered by [employer] includes sequencing and deletion/duplication testing of the following 29 genes: APC, ATM, BARD1, BMPR1A, BRCA1, BRCA2, BRIP1, CHD1, CDK4, CDKN2A, CHEK2, EPCAM (large rearrangement only), HOXB13, MLH1, MSH2, MSH6, MUTYH, NBN, PALB2, PMS2, PTEN, RAD51C, RAD51D, SMAD4, STK11, and TP53. Sequencing was performed for select regions of POLE and POLD1, and large rearrangement analysis was performed for select regions of GREM1.  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 [id number] (p.[id number]).  The date of this test report is 12/12/2017.   01/08/2018 Tumor Marker   Patient's tumor was tested for the following markers: CA-125 Results of the tumor marker test revealed 17.5   03/02/2018 Tumor Marker   Patient's tumor was tested for the following markers: CA-125 Results of the tumor marker test revealed 11.4   03/14/2018 Imaging   MRCP 1. No worrisome pancreatic lesions are currently present. 2. Cholelithiasis. 3. Small benign-appearing bilateral renal cysts. 4. Midline anterior abdominal wall laxity seen on coronal images, potential small hernia in this vicinity without observed complication   03/17/2018 Procedure   Successful right IJ vein Port-A-Cath explant.   12/21/2019 Tumor Marker   Patient's tumor was tested for the following markers: CA-125 Results of the tumor marker test revealed 9.5.   06/19/2020 Tumor Marker    Patient's tumor was tested for the following markers: CA-125 Results of the tumor marker test revealed 12.5   09/01/2020 Imaging   The BMD measured at Femur Neck Left is 0.866 g/cm2 with a T-score of -1.2. This patient is considered osteopenic according to World Health Organization [facility]) criteria   12/19/2020 Tumor Marker   Patient's tumor was tested for the following markers: CA-125. Results of the tumor marker test revealed 10.6   07/04/2021 Imaging   1. No evidence of metastatic disease in the abdomen or pelvis. 2. Signs of hysterectomy and pelvic and abdominal lymphadenectomy. 3. Stable 9 mm low-attenuation focus along the margin of the RIGHT hepatic lobe, unchanged since September of 2018. Likely benign process such as atypical hemangioma. 4. Cholelithiasis 5. Colonic diverticulosis and diverticular disease mainly in the descending and proximal sigmoid colon. 6. Aortic atherosclerosis.   Aortic Atherosclerosis (ICD10-I70.0).       PHYSICAL EXAMINATION: ECOG PERFORMANCE STATUS: 0 - Asymptomatic  Vitals:   10/23/21 0837  BP: 135/67  Pulse: 92  Resp: 18  Temp: (!) 97.5 F (36.4 C)  SpO2: 100%   Filed Weights  10/23/21 0837  Weight: 209 lb 3.2 oz (94.9 kg)    GENERAL:alert, no distress and comfortable SKIN: skin color, texture, turgor are normal, no rashes or significant lesions EYES: normal, Conjunctiva are pink and non-injected, sclera clear OROPHARYNX:no exudate, no erythema and lips, buccal mucosa, and tongue normal  NECK: supple, thyroid normal size, non-tender, without nodularity LYMPH:  no palpable lymphadenopathy in the cervical, axillary or inguinal LUNGS: clear to auscultation and percussion with normal breathing effort HEART: regular rate & rhythm and no murmurs and no lower extremity edema ABDOMEN:abdomen soft, non-tender and normal bowel sounds Musculoskeletal:no cyanosis of digits and no clubbing  NEURO: alert & oriented x 3 with fluent speech, no focal  motor/sensory deficits  LABORATORY DATA:  I have reviewed the data as listed    Component Value Date/Time   NA 141 10/22/2021 0803   NA 141 09/11/2021 1013   NA 141 11/27/2017 0818   K 4.3 10/22/2021 0803   K 3.8 11/27/2017 0818   CL 106 10/22/2021 0803   CO2 24 10/22/2021 0803   CO2 25 11/27/2017 0818   GLUCOSE 111 (H) 10/22/2021 0803   GLUCOSE 90 11/27/2017 0818   BUN 14 10/22/2021 0803   BUN 18 09/11/2021 1013   BUN 12.9 11/27/2017 0818   CREATININE 0.86 10/22/2021 0803   CREATININE 0.8 11/27/2017 0818   CALCIUM 9.8 10/22/2021 0803   CALCIUM 9.9 11/27/2017 0818   PROT 7.0 10/22/2021 0803   PROT 7.6 09/11/2021 1013   PROT 7.0 11/27/2017 0818   ALBUMIN 3.9 10/22/2021 0803   ALBUMIN 5.1 (H) 09/11/2021 1013   ALBUMIN 3.8 11/27/2017 0818   AST 16 10/22/2021 0803   AST 11 11/27/2017 0818   ALT 17 10/22/2021 0803   ALT 11 11/27/2017 0818   ALKPHOS 61 10/22/2021 0803   ALKPHOS 68 11/27/2017 0818   BILITOT 0.6 10/22/2021 0803   BILITOT 0.6 09/11/2021 1013   BILITOT 0.41 11/27/2017 0818   GFRNONAA >60 10/22/2021 0803   GFRAA 77 11/09/2020 1005    No results found for: SPEP, UPEP  Lab Results  Component Value Date   WBC 6.4 10/22/2021   NEUTROABS 3.7 10/22/2021   HGB 12.8 10/22/2021   HCT 40.0 10/22/2021   MCV 87.9 10/22/2021   PLT 339 10/22/2021      Chemistry      Component Value Date/Time   NA 141 10/22/2021 0803   NA 141 09/11/2021 1013   NA 141 11/27/2017 0818   K 4.3 10/22/2021 0803   K 3.8 11/27/2017 0818   CL 106 10/22/2021 0803   CO2 24 10/22/2021 0803   CO2 25 11/27/2017 0818   BUN 14 10/22/2021 0803   BUN 18 09/11/2021 1013   BUN 12.9 11/27/2017 0818   CREATININE 0.86 10/22/2021 0803   CREATININE 0.8 11/27/2017 0818      Component Value Date/Time   CALCIUM 9.8 10/22/2021 0803   CALCIUM 9.9 11/27/2017 0818   ALKPHOS 61 10/22/2021 0803   ALKPHOS 68 11/27/2017 0818   AST 16 10/22/2021 0803   AST 11 11/27/2017 0818   ALT 17 10/22/2021 0803    ALT 11 11/27/2017 0818   BILITOT 0.6 10/22/2021 0803   BILITOT 0.6 09/11/2021 1013   BILITOT 0.41 11/27/2017 0818       RADIOGRAPHIC STUDIES: I have reviewed MRI of the breast myself

## 2021-10-23 NOTE — Assessment & Plan Note (Signed)
Her last CT imaging show no evidence of disease Tumor marker is normal and she is not symptomatic Her previous GYN surgeon has left I will get her scheduled to see another physician next year

## 2021-10-25 ENCOUNTER — Ambulatory Visit: Payer: 59 | Admitting: Hematology and Oncology

## 2021-10-29 ENCOUNTER — Telehealth: Payer: Self-pay

## 2021-10-29 NOTE — Telephone Encounter (Signed)
-----   Message from Heath Lark, MD sent at 10/29/2021  9:23 AM EST ----- Pls call and let her know recent MRI breast is normal

## 2021-10-29 NOTE — Telephone Encounter (Signed)
Patient informed that recent MRI breast was normal. Patient appreciative of call.

## 2021-11-08 ENCOUNTER — Encounter (INDEPENDENT_AMBULATORY_CARE_PROVIDER_SITE_OTHER): Payer: Self-pay | Admitting: Adult Health

## 2021-11-08 ENCOUNTER — Other Ambulatory Visit: Payer: Self-pay

## 2021-11-08 ENCOUNTER — Ambulatory Visit (INDEPENDENT_AMBULATORY_CARE_PROVIDER_SITE_OTHER): Payer: 59 | Admitting: Adult Health

## 2021-11-08 VITALS — BP 117/74 | HR 78 | Temp 97.5°F | Ht 64.0 in | Wt 204.0 lb

## 2021-11-08 DIAGNOSIS — G4733 Obstructive sleep apnea (adult) (pediatric): Secondary | ICD-10-CM

## 2021-11-08 DIAGNOSIS — R7303 Prediabetes: Secondary | ICD-10-CM

## 2021-11-08 DIAGNOSIS — Z6836 Body mass index (BMI) 36.0-36.9, adult: Secondary | ICD-10-CM | POA: Diagnosis not present

## 2021-11-08 NOTE — Progress Notes (Signed)
Chief Complaint:   OBESITY Sierra Gray is here to discuss her progress with her obesity treatment plan along with follow-up of her obesity related diagnoses. Sierra Gray is on the Category 3 Plan and states she is following her eating plan approximately 5% of the time. Sierra Gray states she is walking 30 minutes 2-3 times per week.  Today's visit was #: 22 Starting weight: 212 lbs Starting date: 07/11/2020 Today's weight: 204 lbs Today's date: 11/08/2021 Total lbs lost to date: 8 Total lbs lost since last in-office visit: 0  Interim History:  Since the last OV- Sierra Gray traveled to and from [place].  Celebrated Thanksgiving.  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  She feels that she has been "very loosely" following any of the eating plans for months and would like to re-focus her efforts.  Subjective:   1. Pre-diabetes Pt is unable to tolerate Metformin (loose stools). Her A1c remains elevated, despite lifestyle modifications. She denies family history of MTC.  She denies personal pancreatitis or gallbladder disease.  Discussed the risks/benefits of GLP-1 therapy.  2. OSA (obstructive sleep apnea) Pt has not unpacked CPAP since she returned home form [place].  Assessment/Plan:   1. Pre-diabetes Amit will continue to work on weight loss, exercise, and decreasing simple carbohydrates to help decrease the risk of diabetes.  Check labs at next OV.  Consider initiating Ozempic in the new year.  2. OSA (obstructive sleep apnea) Intensive lifestyle modifications are the first line treatment for this issue. We discussed several lifestyle modifications today and she will continue to work on diet, exercise and weight loss efforts. We will continue to monitor. Orders and follow up as documented in patient record. Restart nightly CPAP use.  3. Obesity with current BMI 34.6  Sierra Gray is currently in the action stage of  change. As such, her goal is to continue with weight loss efforts. She has agreed to the Category 3 Plan.   Focus on maintaining weight over the holidays. Check IC/fasting labs at next OV- pt is aware to arrive 30 minutes prior to OV.  Exercise goals:  As is  Behavioral modification strategies: increasing lean protein intake, decreasing simple carbohydrates, meal planning and cooking strategies, keeping healthy foods in the home, and planning for success.  Sierra Gray has agreed to follow-up with our clinic in 5 weeks. She was informed of the importance of frequent follow-up visits to maximize her success with intensive lifestyle modifications for her multiple health conditions.   Objective:   Blood pressure 117/74, pulse 78, temperature (!) 97.5 F (36.4 C), height 5\' 4"  (1.626 m), weight 204 lb (92.5 kg), SpO2 (!) 78 %. Body mass index is 35.02 kg/m.  General: Cooperative, alert, well developed, in no acute distress. HEENT: Conjunctivae and lids unremarkable. Cardiovascular: Regular rhythm.  Lungs: Normal work of breathing. Neurologic: No focal deficits.   Lab Results  Component Value Date   CREATININE 0.86 10/22/2021   BUN 14 10/22/2021   NA 141 10/22/2021   K 4.3 10/22/2021   CL 106 10/22/2021   CO2 24 10/22/2021   Lab Results  Component Value Date   ALT 17 10/22/2021   AST 16 10/22/2021   ALKPHOS 61 10/22/2021   BILITOT 0.6 10/22/2021   Lab Results  Component Value Date   HGBA1C 5.5 09/11/2021   HGBA1C 5.8 (H) 04/11/2021   HGBA1C 5.6 11/09/2020   HGBA1C 5.6 07/11/2020   HGBA1C 5.5  09/04/2018   Lab Results  Component Value Date   INSULIN 27.4 (H) 09/11/2021   INSULIN 16.7 04/11/2021   INSULIN 23.9 11/09/2020   INSULIN 25.7 (H) 07/11/2020   Lab Results  Component Value Date   TSH 0.285 (L) 07/11/2020   Lab Results  Component Value Date   CHOL 259 (H) 09/11/2021   HDL 51 09/11/2021   LDLCALC 185 (H) 09/11/2021   TRIG 127 09/11/2021   CHOLHDL 5.1 (H)  09/11/2021   Lab Results  Component Value Date   VD25OH 58.5 09/11/2021   VD25OH 50.1 04/11/2021   VD25OH 46.0 11/09/2020   Lab Results  Component Value Date   WBC 6.4 10/22/2021   HGB 12.8 10/22/2021   HCT 40.0 10/22/2021   MCV 87.9 10/22/2021   PLT 339 10/22/2021    Attestation Statements:   Reviewed by clinician on day of visit: allergies, medications, problem list, medical history, surgical history, family history, social history, and previous encounter notes.  Time spent on visit including pre-visit chart review and post-visit care and charting was 32 minutes.   Coral Ceo, CMA, am acting as transcriptionist for Mina Marble, NP.  I have reviewed the above documentation for accuracy and completeness, and I agree with the above. -  Majesty Stehlin d. Kosisochukwu Burningham, NP-C

## 2021-12-13 ENCOUNTER — Encounter (INDEPENDENT_AMBULATORY_CARE_PROVIDER_SITE_OTHER): Payer: Self-pay

## 2021-12-17 ENCOUNTER — Other Ambulatory Visit: Payer: Self-pay

## 2021-12-17 ENCOUNTER — Encounter (INDEPENDENT_AMBULATORY_CARE_PROVIDER_SITE_OTHER): Payer: Self-pay | Admitting: Adult Health

## 2021-12-17 ENCOUNTER — Ambulatory Visit (INDEPENDENT_AMBULATORY_CARE_PROVIDER_SITE_OTHER): Payer: 59 | Admitting: Adult Health

## 2021-12-17 VITALS — BP 147/69 | HR 75 | Temp 98.0°F | Ht 64.0 in | Wt 207.0 lb

## 2021-12-17 DIAGNOSIS — R0602 Shortness of breath: Secondary | ICD-10-CM | POA: Diagnosis not present

## 2021-12-17 DIAGNOSIS — R7303 Prediabetes: Secondary | ICD-10-CM

## 2021-12-17 DIAGNOSIS — E559 Vitamin D deficiency, unspecified: Secondary | ICD-10-CM

## 2021-12-17 DIAGNOSIS — Z9189 Other specified personal risk factors, not elsewhere classified: Secondary | ICD-10-CM | POA: Diagnosis not present

## 2021-12-17 DIAGNOSIS — Z6836 Body mass index (BMI) 36.0-36.9, adult: Secondary | ICD-10-CM

## 2021-12-17 DIAGNOSIS — I10 Essential (primary) hypertension: Secondary | ICD-10-CM

## 2021-12-17 DIAGNOSIS — Z6835 Body mass index (BMI) 35.0-35.9, adult: Secondary | ICD-10-CM

## 2021-12-17 DIAGNOSIS — E66812 Obesity, class 2: Secondary | ICD-10-CM

## 2021-12-17 MED ORDER — AMLODIPINE BESYLATE 2.5 MG PO TABS: 2.5000 mg | ORAL_TABLET | Freq: Every day | ORAL | 0 refills | Status: DC

## 2021-12-17 NOTE — Progress Notes (Signed)
Chief Complaint:   OBESITY Sierra Gray is here to discuss her progress with her obesity treatment plan along with follow-up of her obesity related diagnoses. Sierra Gray is on the Category 3 Plan and states she is following her eating plan approximately 0% of the time. Sierra Gray states she is not exercising regularly at this time.  Today's visit was #: 23 Starting weight: 212 lbs Starting date: 07/11/2020 Today's weight: 207 lbs Today's date: 12/17/2021 Total lbs lost to date: 5 lbs Total lbs lost since last in-office visit: 0  Interim History:  Sierra Gray was mindful with melas over the holidays.   Did not follow a structured plan. On 07/11/2020, RMR 2165.   Today, RMR 1901.   Metabolism decreased by 264.  Subjective:   1. Essential hypertension She denies acute cardiac symptoms. She denies tobacco/vape use.  She is on amlodipine 2.5 mg daily.  2. SOB (shortness of breath) on exertion She reports dyspnea with extreme exertion.  Denies CP with exertion.  3. Vitamin D deficiency She is on OTC vitamin D3 2 x2000 IU tablets and an OTC multivitamin.  4. Pre-diabetes Unable to consistently take metformin. She denies polyphagia.  5. At risk for diabetes mellitus Sierra Gray is at higher than average risk for developing diabetes due to prediabetes and obesity.   Assessment/Plan:   1. Essential hypertension Check labs. Refill amlodipine 2.5 mg daily.  - Comprehensive metabolic panel - Refill amLODipine (NORVASC) 2.5 MG tablet; Take 1 tablet (2.5 mg total) by mouth daily.  Dispense: 90 tablet; Refill: 0  2. SOB (shortness of breath) on exertion Check IC today.  3. Vitamin D deficiency Check vitamin D level today.  - VITAMIN D 25 Hydroxy (Vit-D Deficiency, Fractures)  4. Pre-diabetes Check labs today.  - Hemoglobin A1c - Insulin, random  5. At risk for diabetes mellitus Sierra Gray was given approximately 15 minutes of diabetes education and counseling today. We discussed intensive lifestyle  modifications today with an emphasis on weight loss as well as increasing exercise and decreasing simple carbohydrates in her diet. We also reviewed medication options with an emphasis on risk versus benefit of those discussed.   Repetitive spaced learning was employed today to elicit superior memory formation and behavioral change.  6. Obesity with current BMI 35.5  Sierra Gray is currently in the action stage of change. As such, her goal is to continue with weight loss efforts. She has agreed to the Category 3 Plan.   Exercise goals:  NEAT activities.  Behavioral modification strategies: increasing lean protein intake, decreasing simple carbohydrates, increasing water intake, meal planning and cooking strategies, keeping healthy foods in the home, and planning for success.  Sierra Gray has agreed to follow-up with our clinic in 4 weeks. She was informed of the importance of frequent follow-up visits to maximize her success with intensive lifestyle modifications for her multiple health conditions.   Sierra Gray was informed we would discuss her lab results at her next visit unless there is a critical issue that needs to be addressed sooner. Sierra Gray agreed to keep her next visit at the agreed upon time to discuss these results.  Objective:   Blood pressure (!) 147/69, pulse 75, temperature 98 F (36.7 C), height 5\' 4"  (1.626 m), weight 207 lb (93.9 kg), SpO2 98 %. Body mass index is 35.53 kg/m.  General: Cooperative, alert, well developed, in no acute distress. HEENT: Conjunctivae and lids unremarkable. Cardiovascular: Regular rhythm.  Lungs: Normal work of breathing. Neurologic: No focal deficits.   Lab Results  Component Value Date   CREATININE 0.86 10/22/2021   BUN 14 10/22/2021   NA 141 10/22/2021   K 4.3 10/22/2021   CL 106 10/22/2021   CO2 24 10/22/2021   Lab Results  Component Value Date   ALT 17 10/22/2021   AST 16 10/22/2021   ALKPHOS 61 10/22/2021   BILITOT 0.6 10/22/2021   Lab  Results  Component Value Date   HGBA1C 5.5 09/11/2021   HGBA1C 5.8 (H) 04/11/2021   HGBA1C 5.6 11/09/2020   HGBA1C 5.6 07/11/2020   HGBA1C 5.5 09/04/2018   Lab Results  Component Value Date   INSULIN 27.4 (H) 09/11/2021   INSULIN 16.7 04/11/2021   INSULIN 23.9 11/09/2020   INSULIN 25.7 (H) 07/11/2020   Lab Results  Component Value Date   TSH 0.285 (L) 07/11/2020   Lab Results  Component Value Date   CHOL 259 (H) 09/11/2021   HDL 51 09/11/2021   LDLCALC 185 (H) 09/11/2021   TRIG 127 09/11/2021   CHOLHDL 5.1 (H) 09/11/2021   Lab Results  Component Value Date   VD25OH 58.5 09/11/2021   VD25OH 50.1 04/11/2021   VD25OH 46.0 11/09/2020   Lab Results  Component Value Date   WBC 6.4 10/22/2021   HGB 12.8 10/22/2021   HCT 40.0 10/22/2021   MCV 87.9 10/22/2021   PLT 339 10/22/2021   Attestation Statements:   Reviewed by clinician on day of visit: allergies, medications, problem list, medical history, surgical history, family history, social history, and previous encounter notes.  I, Water quality scientist, CMA, am acting as Location manager for Mina Marble, NP.  I have reviewed the above documentation for accuracy and completeness, and I agree with the above. -  Reagan Behlke d. Miro Balderson, NP-C

## 2021-12-18 LAB — COMPREHENSIVE METABOLIC PANEL
ALT: 17 IU/L (ref 0–32)
AST: 18 IU/L (ref 0–40)
Albumin/Globulin Ratio: 1.8 (ref 1.2–2.2)
Albumin: 4.6 g/dL (ref 3.8–4.9)
Alkaline Phosphatase: 78 IU/L (ref 44–121)
BUN/Creatinine Ratio: 21 (ref 9–23)
BUN: 18 mg/dL (ref 6–24)
Bilirubin Total: 0.3 mg/dL (ref 0.0–1.2)
CO2: 25 mmol/L (ref 20–29)
Calcium: 10.2 mg/dL (ref 8.7–10.2)
Chloride: 102 mmol/L (ref 96–106)
Creatinine, Ser: 0.87 mg/dL (ref 0.57–1.00)
Globulin, Total: 2.5 g/dL (ref 1.5–4.5)
Glucose: 106 mg/dL — ABNORMAL HIGH (ref 70–99)
Potassium: 5.3 mmol/L — ABNORMAL HIGH (ref 3.5–5.2)
Sodium: 142 mmol/L (ref 134–144)
Total Protein: 7.1 g/dL (ref 6.0–8.5)
eGFR: 77 mL/min/{1.73_m2} (ref >59)

## 2021-12-18 LAB — HEMOGLOBIN A1C
Est. average glucose Bld gHb Est-mCnc: 117 mg/dL
Hgb A1c MFr Bld: 5.7 % — ABNORMAL HIGH (ref 4.8–5.6)

## 2021-12-18 LAB — INSULIN, RANDOM: INSULIN: 23.1 u[IU]/mL (ref 2.6–24.9)

## 2021-12-18 LAB — VITAMIN D 25 HYDROXY (VIT D DEFICIENCY, FRACTURES): Vit D, 25-Hydroxy: 54.7 ng/mL (ref 30.0–100.0)

## 2022-01-11 ENCOUNTER — Telehealth: Payer: Self-pay | Admitting: *Deleted

## 2022-01-11 NOTE — Telephone Encounter (Signed)
Per Dr Berline Lopes patient scheduled with Dr Berline Lopes for a follow up 2/27 at 8:45 am

## 2022-01-17 ENCOUNTER — Ambulatory Visit (INDEPENDENT_AMBULATORY_CARE_PROVIDER_SITE_OTHER): Payer: 59 | Admitting: Adult Health

## 2022-01-17 ENCOUNTER — Other Ambulatory Visit: Payer: Self-pay

## 2022-01-17 ENCOUNTER — Encounter (INDEPENDENT_AMBULATORY_CARE_PROVIDER_SITE_OTHER): Payer: Self-pay | Admitting: Adult Health

## 2022-01-17 VITALS — BP 123/78 | HR 74 | Temp 98.3°F | Ht 64.0 in | Wt 202.0 lb

## 2022-01-17 DIAGNOSIS — R7303 Prediabetes: Secondary | ICD-10-CM | POA: Diagnosis not present

## 2022-01-17 DIAGNOSIS — I10 Essential (primary) hypertension: Secondary | ICD-10-CM | POA: Diagnosis not present

## 2022-01-17 DIAGNOSIS — E875 Hyperkalemia: Secondary | ICD-10-CM | POA: Diagnosis not present

## 2022-01-17 DIAGNOSIS — E669 Obesity, unspecified: Secondary | ICD-10-CM

## 2022-01-17 DIAGNOSIS — E559 Vitamin D deficiency, unspecified: Secondary | ICD-10-CM

## 2022-01-17 DIAGNOSIS — Z9189 Other specified personal risk factors, not elsewhere classified: Secondary | ICD-10-CM

## 2022-01-17 DIAGNOSIS — Z6834 Body mass index (BMI) 34.0-34.9, adult: Secondary | ICD-10-CM

## 2022-01-17 MED ORDER — SEMAGLUTIDE-WEIGHT MANAGEMENT 0.25 MG/0.5ML ~~LOC~~ SOAJ: 0.2500 mg | SUBCUTANEOUS | 0 refills | Status: DC

## 2022-01-17 NOTE — Progress Notes (Signed)
Chief Complaint:   OBESITY Sierra Gray is here to discuss her progress with her obesity treatment plan along with follow-up of her obesity related diagnoses. Sierra Gray is on the Category 3 Plan and states she is following her eating plan approximately 75-80% of the time. Sierra Gray states she is not exercising regularly at this time.  Today's visit was #: 24 Starting weight: 212 lbs Starting date: 07/11/2020 Today's weight: 202 lbs Today's date: 01/17/2022 Total lbs lost to date: 10 lbs Total lbs lost since last in-office visit: 5 lbs  Interim History:  Sierra Gray says she really focused on consuming all foods on plan over the last month. She denies GI upset.  Subjective:   1. Essential hypertension Discussed labs with patient today.  On 12/17/2021, CMP - electrolytes and kidney function - normal. BP/HR excellent at office visit. She is on amlodipine 2.5 mg daily.  2. Vitamin D deficiency Discussed labs with patient today.  On 12/17/2021, vitamin D level - 54.7. She is on OTC vitamin D3 2000 plus OTC multivitamin with 1000 D3.  3. Pre-diabetes Worsening.  Discussed labs with patient today.  Unable to consistently take metformin. Denies family history of MTC or personal history of pancreatitis. ON 12/17/2021, BG 106, A1c 5.7, insulin level  - 23.1.  A1c and BG both worsened. Discussed risks/benefits of GLP-1 therapy.  4. Hyperkalemia Discussed labs with patient today.  On 12/17/2021, CMP - potassium  - 5.3. She denies cardiac symptoms.  5. At risk for nausea Sierra Gray is at risk for nausea due to taking GLP-1 for obesity, HTN, and prediabetes.  Assessment/Plan:   1. Essential hypertension Continue amlodipine.  2. Vitamin D deficiency Continue OTC supplement.  3. Pre-diabetes Start Wegovy 0.25 mg once weekly. Discussed risks/benefits of GLP-1 therapy - agreeable to start prescription.  - Start Semaglutide-Weight Management 0.25 MG/0.5ML SOAJ; Inject 0.25 mg into the skin once a week.   Dispense: 2 mL; Refill: 0  4. Hyperkalemia Decrease potassium rich foods - list provided to patient.  5. At risk for nausea KIEARA SCHWARK was given approximately 15 minutes of nausea prevention counseling today. Sierra Gray is at risk for nausea due to her new or current medication. She was encouraged to titrate her medication slowly, make sure to stay hydrated, eat smaller portions throughout the day, and avoid high fat meals.   6. Obesity with current BMI 34.8  Sierra Gray is currently in the action stage of change. As such, her goal is to continue with weight loss efforts. She has agreed to the Category 3 Plan.   Establish with PCP!  Start Wegovy 0.25 mg once weekly.  Exercise goals:  As is.  Behavioral modification strategies: increasing lean protein intake, decreasing simple carbohydrates, increasing water intake, keeping healthy foods in the home, ways to avoid boredom eating, and planning for success.  Sierra Gray has agreed to follow-up with our clinic in 3 weeks. She was informed of the importance of frequent follow-up visits to maximize her success with intensive lifestyle modifications for her multiple health conditions.   Objective:   Blood pressure 123/78, pulse 74, temperature 98.3 F (36.8 C), height 5\' 4"  (1.626 m), weight 202 lb (91.6 kg), SpO2 99 %. Body mass index is 34.67 kg/m.  General: Cooperative, alert, well developed, in no acute distress. HEENT: Conjunctivae and lids unremarkable. Cardiovascular: Regular rhythm.  Lungs: Normal work of breathing. Neurologic: No focal deficits.   Lab Results  Component Value Date   CREATININE 0.87 12/17/2021   BUN 18 12/17/2021  NA 142 12/17/2021   K 5.3 (H) 12/17/2021   CL 102 12/17/2021   CO2 25 12/17/2021   Lab Results  Component Value Date   ALT 17 12/17/2021   AST 18 12/17/2021   ALKPHOS 78 12/17/2021   BILITOT 0.3 12/17/2021   Lab Results  Component Value Date   HGBA1C 5.7 (H) 12/17/2021   HGBA1C 5.5 09/11/2021    HGBA1C 5.8 (H) 04/11/2021   HGBA1C 5.6 11/09/2020   HGBA1C 5.6 07/11/2020   Lab Results  Component Value Date   INSULIN 23.1 12/17/2021   INSULIN 27.4 (H) 09/11/2021   INSULIN 16.7 04/11/2021   INSULIN 23.9 11/09/2020   INSULIN 25.7 (H) 07/11/2020   Lab Results  Component Value Date   TSH 0.285 (L) 07/11/2020   Lab Results  Component Value Date   CHOL 259 (H) 09/11/2021   HDL 51 09/11/2021   LDLCALC 185 (H) 09/11/2021   TRIG 127 09/11/2021   CHOLHDL 5.1 (H) 09/11/2021   Lab Results  Component Value Date   VD25OH 54.7 12/17/2021   VD25OH 58.5 09/11/2021   VD25OH 50.1 04/11/2021   Lab Results  Component Value Date   WBC 6.4 10/22/2021   HGB 12.8 10/22/2021   HCT 40.0 10/22/2021   MCV 87.9 10/22/2021   PLT 339 10/22/2021   Attestation Statements:   Reviewed by clinician on day of visit: allergies, medications, problem list, medical history, surgical history, family history, social history, and previous encounter notes.  I, Water quality scientist, CMA, am acting as Location manager for Mina Marble, NP.  I have reviewed the above documentation for accuracy and completeness, and I agree with the above. -  Halleigh Comes d. Mai Longnecker, NP-C

## 2022-01-22 ENCOUNTER — Encounter (INDEPENDENT_AMBULATORY_CARE_PROVIDER_SITE_OTHER): Payer: Self-pay

## 2022-01-31 ENCOUNTER — Telehealth: Payer: Self-pay | Admitting: *Deleted

## 2022-01-31 NOTE — Telephone Encounter (Signed)
Patient called back and rescheduled her appt from 2/27 to 2/28 with [facility]

## 2022-01-31 NOTE — Telephone Encounter (Signed)
Called and left the patient a message to call the office back. Per Dr Berline Lopes need to move patient's appt from 2/27 to either [facility] APP of Dr Delsa Sale

## 2022-02-04 ENCOUNTER — Encounter: Payer: Self-pay | Admitting: Gynecologic Oncology

## 2022-02-04 ENCOUNTER — Ambulatory Visit: Payer: Self-pay | Admitting: Gynecologic Oncology

## 2022-02-05 ENCOUNTER — Other Ambulatory Visit: Payer: Self-pay

## 2022-02-05 ENCOUNTER — Inpatient Hospital Stay: Payer: 59

## 2022-02-05 ENCOUNTER — Inpatient Hospital Stay: Payer: 59 | Attending: Gynecologic Oncology | Admitting: Gynecologic Oncology

## 2022-02-05 VITALS — BP 131/80 | HR 82 | Temp 98.1°F | Resp 18 | Wt 208.0 lb

## 2022-02-05 DIAGNOSIS — Z1501 Genetic susceptibility to malignant neoplasm of breast: Secondary | ICD-10-CM | POA: Insufficient documentation

## 2022-02-05 DIAGNOSIS — Z1502 Genetic susceptibility to malignant neoplasm of ovary: Secondary | ICD-10-CM | POA: Diagnosis not present

## 2022-02-05 DIAGNOSIS — Z79899 Other long term (current) drug therapy: Secondary | ICD-10-CM | POA: Insufficient documentation

## 2022-02-05 DIAGNOSIS — K5790 Diverticulosis of intestine, part unspecified, without perforation or abscess without bleeding: Secondary | ICD-10-CM | POA: Diagnosis not present

## 2022-02-05 DIAGNOSIS — D126 Benign neoplasm of colon, unspecified: Secondary | ICD-10-CM

## 2022-02-05 DIAGNOSIS — C562 Malignant neoplasm of left ovary: Secondary | ICD-10-CM

## 2022-02-05 DIAGNOSIS — Z9221 Personal history of antineoplastic chemotherapy: Secondary | ICD-10-CM | POA: Insufficient documentation

## 2022-02-05 DIAGNOSIS — Z1509 Genetic susceptibility to other malignant neoplasm: Secondary | ICD-10-CM | POA: Insufficient documentation

## 2022-02-05 LAB — CBC WITH DIFFERENTIAL/PLATELET
Abs Immature Granulocytes: 0.03 10*3/uL (ref 0.00–0.07)
Basophils Absolute: 0.1 10*3/uL (ref 0.0–0.1)
Basophils Relative: 1 %
Eosinophils Absolute: 0.2 10*3/uL (ref 0.0–0.5)
Eosinophils Relative: 3 %
HCT: 40.5 % (ref 36.0–46.0)
Hemoglobin: 13.2 g/dL (ref 12.0–15.0)
Immature Granulocytes: 0 %
Lymphocytes Relative: 24 %
Lymphs Abs: 1.7 10*3/uL (ref 0.7–4.0)
MCH: 28.1 pg (ref 26.0–34.0)
MCHC: 32.6 g/dL (ref 30.0–36.0)
MCV: 86.4 fL (ref 80.0–100.0)
Monocytes Absolute: 0.6 10*3/uL (ref 0.1–1.0)
Monocytes Relative: 8 %
Neutro Abs: 4.6 10*3/uL (ref 1.7–7.7)
Neutrophils Relative %: 64 %
Platelets: 351 10*3/uL (ref 150–400)
RBC: 4.69 MIL/uL (ref 3.87–5.11)
RDW: 14.3 % (ref 11.5–15.5)
WBC: 7.3 10*3/uL (ref 4.0–10.5)
nRBC: 0 % (ref 0.0–0.2)

## 2022-02-05 LAB — COMPREHENSIVE METABOLIC PANEL
ALT: 20 U/L (ref 0–44)
AST: 15 U/L (ref 15–41)
Albumin: 4.3 g/dL (ref 3.5–5.0)
Alkaline Phosphatase: 68 U/L (ref 38–126)
Anion gap: 9 (ref 5–15)
BUN: 18 mg/dL (ref 6–20)
CO2: 27 mmol/L (ref 22–32)
Calcium: 10.4 mg/dL — ABNORMAL HIGH (ref 8.9–10.3)
Chloride: 104 mmol/L (ref 98–111)
Creatinine, Ser: 0.83 mg/dL (ref 0.44–1.00)
GFR, Estimated: >60 mL/min (ref >60)
Glucose, Bld: 108 mg/dL — ABNORMAL HIGH (ref 70–99)
Potassium: 4.2 mmol/L (ref 3.5–5.1)
Sodium: 140 mmol/L (ref 135–145)
Total Bilirubin: 0.5 mg/dL (ref 0.3–1.2)
Total Protein: 7.6 g/dL (ref 6.5–8.1)

## 2022-02-05 NOTE — Patient Instructions (Signed)
Your exam today is normal. Plan to have a CA 125 today and we will contact you with the results.  We will place a referral for you to get back in with Dr. Ardis Hughs with Gastroenterology for follow up.  Recommendations for primary care providers include: -Rachell Cipro MD at [phone] -[facility]: Micheline Rough MD, Reather Converse MD at [phone]  Plan to follow up in six months or sooner if needed. Please call the office for any questions, concerns, or new symptoms. If the left abdominal pain persists, worsens, please give [name] a call or Dr. Ardis Hughs for further evaluation.   Symptoms to report to your health care team include vaginal bleeding, rectal bleeding, bloating, weight loss without effort, new and persistent pain, new and  persistent fatigue, new leg swelling, new masses (i.e., bumps in your neck or groin), new and persistent cough, new and persistent nausea and vomiting, change in bowel or bladder habits, and any other concerns.

## 2022-02-06 ENCOUNTER — Telehealth: Payer: Self-pay

## 2022-02-06 LAB — CA 125: Cancer Antigen (CA) 125: 10 U/mL (ref 0.0–38.1)

## 2022-02-06 NOTE — Telephone Encounter (Signed)
Spoke with Sierra Gray this afternoon and reviewed her CA 125, CBC and CMP results. Per [name], NP CA 125 (10.0) is stable and normal. Overall CMP looks good. Calcium is slightly elevated at 10.4. Patient reports she does take calcium in one of her supplements. Advised not to take calcium supplement.  ? ?Informed patient that she will be receiving a credit for her CBC and CMP tests as these were standing orders and were not supposed to be drawn yesterday. Patient verbalizes understanding of above information, she will call with any questions or concerns.  ?

## 2022-02-06 NOTE — Progress Notes (Signed)
Gynecologic Oncology Follow Up  Sierra Gray 61 y.o. 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 S/p adjuvant chemotherapy with 6 cycles carboplatin and paclitaxel completed 01/29/18. 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   Ovarian cancer history:  Complete clinical response with no evidence of recurrence on today's examination. Recommendation remains for continued 6 monthly surveillance visits with CA 125 draws until February, 2024. CA 125 will be checked today and she will be contacted with the results.    ATM mutation:  She sees Dr Alvy Bimler for prophylactic anastrazole for breast cancer risk reduction related to her ATM mutation. Pancreatic MRI for screening purposes was normal in April, 2019. Continue follow up as scheduled with Dr. Alvy Bimler.   We will also place a referral back to [place] for follow up with Dr. Ardis Hughs. Recommendations for primary care providers with phone numbers provided to the patient. Reportable signs and symptoms reviewed including return of abdominal pain and given in instructions on AVS. She is advised to call for any needs.  HPI: Sierra Gray is a 61 year old female, G1P0, initially seen in consultation at the request of Dr. Nicoletta Dress for a pelvic mass. In late August 2018, she developed abdominal pain which increased along with the development of a temperature of 102. The pain dissipated but returned intermittently. Due to the persistent pain, she sought evaluation with her PCP. She did report weight loss of 20-25 lbs over 6-9 months but this was intentional.    On 08/19/2017 with [facility], she underwent a CT scan which revealed a trace amount of ascites in the peritoneal cavity prominently seen in the lateral paracolic gutter and  along the liver capsule. There was no nodular peritoneal enhancement identified to suggest peritoneal carcinomatosis. A neoplastic mass that is cystic and solid replaces the entire uterus. It extends caudally through the cervix and appears to invade the upper one third of the vagina. The caudal margin of the neoplasm may directly invade the posterior bladder dome, though the mural thickness is normal. No fat separate the mass from the adjacent upper third of the rectum or from the mid sigmoid. The mass measures 12 x 13 x 20 cm in size and posteriorly laterally displaces the right ovary. The left ovary was not confidently identified. 1 cm low attenuation lesion of the liver. Of note, the patient had undergone a laparoscopic hysterectomy at [facility] approximate 14 years ago secondary to fibroids.  On 09/11/17, she underwent an exploratory laparotomy, BSO, omentectomy, pelvic and para-aortic lymphadenectomy, radical tumor debulking with Dr. Everitt Amber. Intraoperative findings were significant for a 20+cm left ovarian mass with normal right ovary and omentum. No extraovarian disease was seen. Frozen confirmed adenocarcinoma. There was no gross residual disease at completion of the surgery.  Final pathology confirmed a 20 cm grade 1 endometrioid left ovarian adenocarcinoma with surface involvement but negative washings (stage IC2). All other resected tissue (including omentum, nodes and peritoneal biopsy and washings) were negative. [facility] Tumor [facility] testing resulted on 12/24/2017 as negative for deleterious BRCA1 or BRCA2 mutation in tumor. Germline testing with [facility] as well reported on 12/12/2017 revealed an ATM mutation.  In accordance with NCCN guidelines, she was recommended adjuvant carboplatin and paclitaxel chemotherapy and her case was discussed at multi-disciplinary tumor conference.  She went on to complete adjuvant chemotherapy with 6 cycles of carboplatin and paclitaxel in February, 2019. She tolerated  therapy well and had a complete clinical response.   Since that time, she had a ventral abdominal hernia repair at [facility] in June, 2019. She had a screening colonoscopy on 07/15/2018 with Dr. Ardis Hughs with a frond-like/villous and fungating non-obstructing medium sized mass at the presumed hepatic flexure. Biopsy returned as a tubular adenoma and she was referred to general surgery. She then underwent laparoscopic assisted right hemicolectomy with Dr Hassell Done on 09/11/18. It showed benign tubular adenomas.  Previous tumor markers: On 08/31/18 CA 125 was normal at 10.  On 11/21/19 CA 125 was normal at 9.7. On 12/21/19 CA 125 was normal at 9.5. On 06/19/20 CA 125 was normal at 12.5.  On 12/19/20 CA 125 was normal at 10.6 On 06/19/21 CA 125 was normal at 10.4. On 10/22/21 CA 125 was normal at 11.6.  Interval History: She presents today for continued follow-up.  She states she has been doing well since her last visit.  [facility] on a [facility] basis.  She has a food plan and has been on medications as well.  She has been tolerating her diet with no nausea or emesis.  No unintentional weight loss, change in appetite, abdominal bloating, or early satiety reported.  Her bowels and bladder are functioning without issues.  She reports intermittent left lower quadrant abdominal discomfort that she is experienced twice and now has resolved.  She feels like this may be related to her GI tract, being told after her last colonoscopy that she has diverticulosis. Overall, no symptoms concerning for recurrent cancer. She does not have a PCP at this time. No concerns voiced.  Review of Systems: Constitutional: Feels well.  No fever, chills, unintentional weight loss or gain, change in appetite, early satiety. Cardiovascular: No chest pain, shortness of breath, or edema.  Pulmonary: No cough or wheeze.  Gastrointestinal: Positive for recent LLQ abdominal discomfort that has  resolved. No nausea, vomiting, or diarrhea. No bright red blood per rectum or change in bowel movement.  Genitourinary: No frequency, urgency, or dysuria. No vaginal bleeding or discharge.  Musculoskeletal: No new myalgia or joint pain. Her left knee is "bone on bone." Neurologic: No new weakness, numbness, or change in gait.  Psychology: No new complaints related to depression, anxiety, or insomnia.  Health Maintenance: Mammogram: 04/17/2021, also has breast MRIs with last in 10/2021 Pap Smear: N/A, past hx of hysterectomy Colonoscopy: Last 07/15/2018 with recommendation for repeat in 3 years Bone Density Scan: 09/01/2020-osteopenic Vit D Level: 12/17/2021 at 54.7 Lipid Panel: 09/11/2021 A1C: 12/17/21 at 5.7  Current Meds:  Outpatient Encounter Medications as of 02/05/2022  Medication Sig   amLODipine (NORVASC) 2.5 MG tablet Take 1 tablet (2.5 mg total) by mouth daily.   anastrozole (ARIMIDEX) 1 MG tablet TAKE 1 TABLET BY MOUTH EVERY DAY   Apple Cider Vinegar 600 MG CAPS Take 1,200 mg by mouth 2 (two) times daily. Takes in morning and in evening.   Ascorbic Acid (VITAMIN C) 1000 MG tablet Take 1,000 mg by mouth daily.   cetirizine (ZYRTEC) 10 MG tablet Take 10 mg by mouth daily.   Cholecalciferol (VITAMIN D3) 125 MCG (5000 UT) CAPS Take 3,000 Units by mouth daily.    Cinnamon 500 MG TABS Take 2,000 mg by mouth 2 (two) times daily.   ibuprofen (ADVIL,MOTRIN) 200 MG tablet Take 400 mg by  mouth every 6 (six) hours as needed.   Inulin (FIBER CHOICE PO) Take 10 g by mouth daily.   Liniments (SALONPAS PAIN RELIEF PATCH EX) Place 1 patch onto the skin daily as needed (knee pain).   Melatonin 5 MG TABS Take 5 mg by mouth daily.    Multiple Vitamins-Minerals (HAIR SKIN & NAILS ADVANCED PO) Take 1 tablet by mouth daily.   Probiotic Product (PROBIOTIC DAILY PO) Take by mouth.   Turmeric 500 MG TABS Take 500 mg by mouth daily.   Vitamin E 100 units TABS Take by mouth.   OVER THE COUNTER MEDICATION  Prebiotic (Patient not taking: Reported on 02/04/2022)   [DISCONTINUED] Semaglutide-Weight Management 0.25 MG/0.5ML SOAJ Inject 0.25 mg into the skin once a week. (Patient not taking: Reported on 02/04/2022)   No facility-administered encounter medications on file as of 02/05/2022.    Allergy:  Allergies  Allergen Reactions   Bee Venom Anaphylaxis   Chlorhexidine Rash    Surgical prep wash   Paclitaxel Other (See Comments)    Experienced severe back pain and flushing   Bacitracin Rash   Iodine Rash    Not IV contrast, per patient. tkv   Other Rash    Ortho prep SURGICAL GLUE--RASH, VERY BOTHERSOME, REQUIRED BENADRYL     Social Hx:   Social History   Socioeconomic History   Marital status: Single    Spouse name: Not on file   Number of children: Not on file   Years of education: Not on file   Highest education level: Not on file  Occupational History   Occupation: Freight forwarder  Tobacco Use   Smoking status: Never   Smokeless tobacco: Never  Vaping Use   Vaping Use: Never used  Substance and Sexual Activity   Alcohol use: Yes    Comment: Socially drinks wine/beer   Drug use: No   Sexual activity: Never  Other Topics Concern   Not on file  Social History Narrative   Not on file   Social Determinants of Health   Financial Resource Strain: Not on file  Food Insecurity: Not on file  Transportation Needs: Not on file  Physical Activity: Not on file  Stress: Not on file  Social Connections: Not on file  Intimate Partner Violence: Not on file    Past Surgical Hx:  Past Surgical History:  Procedure Laterality Date   ABDOMINAL HYSTERECTOMY  2009   Total laparoscopic hyst for fibroids   BILATERAL SALPINGECTOMY Bilateral 09/11/2017   Procedure: BILATERAL SALPINGECTOMY WITH OOPHERECTOMY;  Surgeon: Everitt Amber, MD;  Location: [facility];  Service: Gynecology;  Laterality: Bilateral;   BREAST BIOPSY  12/16/2014   2 lumps in  right breast removed   BREAST LUMPECTOMY Left     BREAST REDUCTION SURGERY     EXCISION OF SKIN TAG N/A 09/11/2017   Procedure: EXCISION OF SKIN TAG ON THIGH;  Surgeon: Everitt Amber, MD;  Location: [facility];  Service: Gynecology;  Laterality: N/A;   IR FLUORO GUIDE PORT INSERTION RIGHT  11/03/2017   IR REMOVAL TUN ACCESS W/ PORT W/O FL MOD SED  03/17/2018   IR US GUIDE VASC ACCESS RIGHT  11/03/2017   KNEE SURGERY Right    x3   LAPAROSCOPIC PARTIAL RIGHT COLECTOMY Right 09/11/2018   Procedure: LAPAROSCOPIC ASSISTED  PARTIAL RIGHT HEMI-COLECTOMY ERAS PATHWAY;  Surgeon: Johnathan Hausen, MD;  Location: [facility];  Service: General;  Laterality: Right;   LAPAROTOMY Bilateral 09/11/2017   Procedure: EXPLORATORY LAPAROTOMY;  Surgeon: Everitt Amber,  MD;  Location: [facility];  Service: Gynecology;  Laterality: Bilateral;   LAPAROTOMY WITH STAGING N/A 09/11/2017   Procedure: PELVIC AND PARA AORTIC LYMPH NODE  DISSECTION;  Surgeon: Everitt Amber, MD;  Location: [facility];  Service: Gynecology;  Laterality: N/A;   OMENTECTOMY N/A 09/11/2017   Procedure: OMENTECTOMY;  Surgeon: Everitt Amber, MD;  Location: [facility];  Service: Gynecology;  Laterality: N/A;   REDUCTION MAMMAPLASTY     WISDOM TOOTH EXTRACTION      Past Medical Hx:  Past Medical History:  Diagnosis Date   Anemia    hx of   Anemia    Anxiety    Arthritis    oa knees   Back pain    Breast lump in female    benign   Constipation    Depression    Family history of pancreatic cancer 11/27/2017   Family history of prostate cancer 11/27/2017   Fibroids    Hyperlipidemia    Hypertension    Joint pain    Migraine    hx of    Osteoarthritis    Ovarian cancer ([place])    Pelvic mass in female    Pelvic mass in female    PONV (postoperative nausea and vomiting)    nausea only   Sleep apnea    SOB (shortness of breath)    Swelling of both lower extremities    Vitamin D deficiency     Oncology Hx:  Oncology History Overview Note  Genetic testing came back positive for ATM variant   Left ovarian  epithelial cancer ([place])  08/19/2017 Imaging   She had outside CT scan which showed large abdominal mass   08/27/2017 Tumor Marker   Patient's tumor was tested for the following markers: CA-125 Results of the tumor marker test revealed 904.5   09/11/2017 Pathology Results   1. 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 Soft tissue, biopsy, right medial thigh MATURE LIPOMA 3. Ovary and fallopian tube, right ENDOMETRIOMA AND SIMPLE SEROUS CYST WITH FALLOPIAN TUBE ADHESION NEGATIVE FOR MALIGNANCY 4. Omentum, resection for tumor BENIGN OMENTUM NEGATIVE FOR CARCINOMA 5. Lymph nodes, regional resection, right pelvic EIGHT BENIGN LYMPH NODES (0/8) 6. Lymph nodes, regional resection, left pelvic SEVEN BENIGN LYMPH NODES (0/7) 7. Lymph node, biopsy, right para-aortic FOUR BENIGN LYMPH NODES (0/4) 8. Lymph node, biopsy, left para-aortic ONE BENIGN LYMPH NODE (0/1) 9. Peritoneum, biopsy, left abdominal BENIGN FIBROMUSCULAR TISSUE 10. Peritoneum, biopsy, right abdominal BENIGN FIBROMUSCULAR TISSUE WITH SEROSITIS 11. Peritoneum, biopsy, pelvic FIBROADIPOSE TISSUE WITH SEROSITIS  Specimen(s): Ovary and fallopian tube Procedure: (including lymph node sampling): salpingo-oophorectomy Primary tumor site (including laterality): Left ovary Ovarian surface involvement: Yes Ovarian capsule intact without fragmentation: intact Maximum tumor size (cm): 20.0 cm Histologic type: Endometrioid carcinoma Grade: 1 Peritoneal implants: (specify invasive or non-invasive): Negative Pelvic extension (list additional structures on separate lines and if involved): Negative Lymph nodes: number examined 20 ; number positive 0 TNM code: pT1c2, pN0, pMx FIGO Stage (based on pathologic findings, needs clinical correlation): IC2    09/11/2017 Surgery   Preoperative Diagnosis: 1. left adnexal mass.   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    Procedure(s) Performed: 1. Exploratory laparotomy with bilateral salpingo-oophorectomy, pelvic and para-aortic lymph node dissection, omentectomy and radical debulking for ovarian cancer (CPT [phone])   Surgeon: Everitt Amber,  M.D.  Operative Findings:20 cm left ovarian mass.   No intraperitoneal rupture occurred;  Frozen pathology was consistent with adenocarcinoma; right tube and ovary normal in appearance; 3) normal bilateral pelvic and para-aortic lymph nodes and omentum; small and large bowel to palpation. This represented an optimal cytoreduction with no gross visible disease remaining.    10/09/2017 Tumor Marker   Patient's tumor was tested for the following markers: CA-125 Results of the tumor marker test revealed 68.7   10/15/2017 Adverse Reaction   She developed reaction to Paclitaxel.   10/15/2017 - 01/29/2018 Chemotherapy   She received carboplatin. Due to infusion reaction to Taxol with cycle 1, treatment was switched to carboplatin and Abraxane   11/03/2017 Procedure   Successful placement of a right internal jugular approach power injectable Port-A-Cath. The catheter is ready for immediate use.   11/27/2017 Tumor Marker   Patient's tumor was tested for the following markers: CA-125 Results of the tumor marker test revealed 27.7   12/12/2017 Genetic Testing   The patient had genetic testing due to a personal history of ovarian cancer and a family history of pancreatic cancer (and possibly breast cancer).  The [facility] Hereditary Cancer Panel + tumor HRD analysis was ordered from the laboratory [facility]. The [facility] gene panel offered by [employer] includes sequencing and deletion/duplication testing of the following 29 genes: APC, ATM, BARD1, BMPR1A, BRCA1, BRCA2, BRIP1, CHD1, CDK4, CDKN2A, CHEK2, EPCAM (large  rearrangement only), HOXB13, MLH1, MSH2, MSH6, MUTYH, NBN, PALB2, PMS2, PTEN, RAD51C, RAD51D, SMAD4, STK11, and TP53. Sequencing was performed for select regions of POLE and POLD1, and large rearrangement analysis was performed for select regions of GREM1.  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 [id number] (p.[id number]).  The date of this test report is 12/12/2017.   01/08/2018 Tumor Marker   Patient's tumor was tested for the following markers: CA-125 Results of the tumor marker test revealed 17.5   03/02/2018 Tumor Marker   Patient's tumor was tested for the following markers: CA-125 Results of the tumor marker test revealed 11.4   03/14/2018 Imaging   MRCP 1. No worrisome pancreatic lesions are currently present. 2. Cholelithiasis. 3. Small benign-appearing bilateral renal cysts. 4. Midline anterior abdominal wall laxity seen on coronal images, potential small hernia in this vicinity without observed complication   07/09/4480 Procedure   Successful right IJ vein Port-A-Cath explant.   12/21/2019 Tumor Marker   Patient's tumor was tested for the following markers: CA-125 Results of the tumor marker test revealed 9.5.   06/19/2020 Tumor Marker   Patient's tumor was tested for the following markers: CA-125 Results of the tumor marker test revealed 12.5   09/01/2020 Imaging   The BMD measured at Femur Neck Left is 0.866 g/cm2 with a T-score of -1.2. This patient is considered osteopenic according to [facility]) criteria   12/19/2020 Tumor Marker   Patient's tumor was tested for the following markers: CA-125. Results of the tumor marker test revealed 10.6   07/04/2021 Imaging   1. No evidence of metastatic disease in the abdomen or pelvis. 2. Signs of hysterectomy and pelvic and abdominal lymphadenectomy. 3. Stable 9 mm low-attenuation focus along the margin of the RIGHT hepatic lobe, unchanged since September of 2018. Likely benign  process such as atypical hemangioma. 4. Cholelithiasis 5. Colonic diverticulosis and diverticular disease mainly in the descending and proximal sigmoid colon. 6. Aortic atherosclerosis.   Aortic Atherosclerosis (ICD10-I70.0).       Family Hx:  Family History  Problem Relation Age of Onset   Hypertension Mother    Hyperlipidemia Mother    Thyroid disease Mother    Pancreatic cancer Father 46       died 59 months after dx   Hypertension Father        questionable   Prostate cancer Father 61       had surgery for it   Cancer Father    Depression Father    Sleep apnea Father    Obesity Father    Breast cancer Maternal Aunt        dx 36's- she had surgery, think it was cancer but could have been just a lump removed   Heart disease Maternal Aunt    Lung cancer Maternal Aunt        primary   Heart attack Maternal Grandmother    Diabetes Maternal Grandfather    Dementia Maternal Grandfather    Osteoporosis Paternal Grandmother    Colon cancer Paternal Grandfather 30   Stomach cancer Neg Hx    Esophageal cancer Neg Hx     Vitals:  Blood pressure 131/80, pulse 82, temperature 98.1 F (36.7 C), temperature source Oral, resp. rate 18, weight 208 lb (94.3 kg), SpO2 97 %.  Physical Exam: General: Well developed, well nourished female in no acute distress. Alert and oriented x 3.  Neck: Supple without any enlargements.  Lymph node survey: No cervical, supraclavicular, or inguinal adenopathy.  Cardiovascular: Regular rate and rhythm. S1 and S2 normal.  Lungs: Clear to auscultation bilaterally. No wheezes/crackles/rhonchi noted.  Skin: No rashes or lesions present. Back: No CVA tenderness.  Abdomen: Abdomen soft, non-tender and obese. Active bowel sounds in all quadrants. No evidence of a fluid wave or abdominal masses. Incisions well healed. No obvious evidence of hernia at the healed midline incision with increased abdominal pressure but there is a slight soft bulge when abdomen is  relaxed.   Genitourinary:    Vulva/vagina: Normal external female genitalia. No lesions.    Urethra: No lesions or masses.    Vagina: Mildly atrophic without any lesions. No palpable masses. No vaginal bleeding or drainage noted.  Rectal: Good tone, no masses, no cul de sac nodularity.  Extremities: No bilateral cyanosis, edema, or clubbing.    Dorothyann Gibbs, MD 02/06/2022, 3:11 PM

## 2022-02-07 ENCOUNTER — Other Ambulatory Visit: Payer: Self-pay

## 2022-02-07 ENCOUNTER — Encounter (INDEPENDENT_AMBULATORY_CARE_PROVIDER_SITE_OTHER): Payer: Self-pay | Admitting: Adult Health

## 2022-02-07 ENCOUNTER — Ambulatory Visit (INDEPENDENT_AMBULATORY_CARE_PROVIDER_SITE_OTHER): Payer: 59 | Admitting: Adult Health

## 2022-02-07 VITALS — BP 132/84 | HR 76 | Temp 98.3°F | Ht 64.0 in | Wt 203.0 lb

## 2022-02-07 DIAGNOSIS — E669 Obesity, unspecified: Secondary | ICD-10-CM

## 2022-02-07 DIAGNOSIS — E782 Mixed hyperlipidemia: Secondary | ICD-10-CM

## 2022-02-07 DIAGNOSIS — I10 Essential (primary) hypertension: Secondary | ICD-10-CM

## 2022-02-07 DIAGNOSIS — Z9189 Other specified personal risk factors, not elsewhere classified: Secondary | ICD-10-CM

## 2022-02-07 DIAGNOSIS — Z6836 Body mass index (BMI) 36.0-36.9, adult: Secondary | ICD-10-CM

## 2022-02-07 DIAGNOSIS — G4733 Obstructive sleep apnea (adult) (pediatric): Secondary | ICD-10-CM | POA: Diagnosis not present

## 2022-02-07 DIAGNOSIS — R7303 Prediabetes: Secondary | ICD-10-CM | POA: Diagnosis not present

## 2022-02-07 DIAGNOSIS — Z6834 Body mass index (BMI) 34.0-34.9, adult: Secondary | ICD-10-CM

## 2022-02-07 MED ORDER — WEGOVY 0.25 MG/0.5ML ~~LOC~~ SOAJ: 0.2500 mg | SUBCUTANEOUS | 0 refills | Status: DC

## 2022-02-07 MED ORDER — ROSUVASTATIN CALCIUM 10 MG PO TABS: 10.0000 mg | ORAL_TABLET | Freq: Every day | ORAL | 0 refills | Status: DC

## 2022-02-12 ENCOUNTER — Other Ambulatory Visit (INDEPENDENT_AMBULATORY_CARE_PROVIDER_SITE_OTHER): Payer: Self-pay | Admitting: Adult Health

## 2022-02-12 DIAGNOSIS — G4733 Obstructive sleep apnea (adult) (pediatric): Secondary | ICD-10-CM | POA: Insufficient documentation

## 2022-02-12 NOTE — Progress Notes (Signed)
? ? ? ?Chief Complaint:  ? ?OBESITY ?Sierra Gray is here to discuss her progress with her obesity treatment plan along with follow-up of her obesity related diagnoses. [name] is on the Category 3 Plan and states she is following her eating plan approximately 65% of the time. [name] states she is not exercising regularly at this time. ? ?Today's visit was #: 25 ?Starting weight: 212 lbs ?Starting date: 07/11/2020 ?Today's weight: 203 lbs ?Today's date: 02/07/2022 ?Total lbs lost to date: 9 lbs ?Total lbs lost since last in-office visit: 0 ? ?Interim History:  ?[name] attended her nephew's wedding weekend on January 26, 2022 (4 day celebration), and then went to [place] for 2 days afterwards. ?She utilized a [employer] during recent travels and essentially maintained her weight with only a slight increase by 1 lb since last OV. ? ?Subjective:  ? ?1. OSA (obstructive sleep apnea) ?Diagnosed with OSA in 2016/2017. ?Uses her CPAP nightly 100%. ? ?2. Pre-diabetes ?Unable to consistently take metformin daily. ?Been off for months. ? ?3. Essential hypertension ?Amlodipine 2.5 mg daily. ?She denies acute cardiac symptoms. ?Do not recommend phentermine as a weight loss aid. ? ?4. Mixed hyperlipidemia ?On 02/05/2022, CMP - LFTs normal. ?On 09/11/2021, lipid panel - total/LDL - both quite elevated. ?Total 259 ?LDL 185 ?She does not have a PCP. ? ?5. At risk for heart disease ?[name] is at higher than average risk for cardiovascular disease due to HTN, HLD, prediabetes, and obesity. ? ?Assessment/Plan:  ? ?1. OSA (obstructive sleep apnea) ?Continue with weight loss efforts. ? ?2. Pre-diabetes ?Will attempt Wegovy again.   ? ?If not, then restart metformin- does not require RF at this time. ?Re-start Metformin '500mg'$  QD ? ?- Start Semaglutide-Weight Management (WEGOVY) 0.25 MG/0.5ML SOAJ; Inject 0.25 mg into the skin once a week.  Dispense: 2 mL; Refill: 0 ? ?3. Essential hypertension ?Continue daily CCB. ? ?4. Mixed hyperlipidemia ?Check  CMP/lipids in 6 weeks from today. ?Start Crestor 10 mg twice weekly - take at dinner. ?Remain well hydrated. ? ?- Start rosuvastatin (CRESTOR) 10 MG tablet; Take 1 tablet (10 mg total) by mouth twice weekly.  Dispense: 8 tablet; Refill: 0 ? ?5. At risk for heart disease ?Sierra Gray was given approximately 15 minutes of coronary artery disease prevention counseling today. She is 61 y.o. female and has risk factors for heart disease including obesity. We discussed intensive lifestyle modifications today with an emphasis on specific weight loss instructions and strategies. ? ?Repetitive spaced learning was employed today to elicit superior memory formation and behavioral change.  ? ?6. Obesity with current BMI 34.8 ? ?[name] is currently in the action stage of change. As such, her goal is to continue with weight loss efforts. She has agreed to the Category 3 Plan.  ? ?Start Wegovy 0.25 mg once weekly. ? ?Exercise goals:  As is. ? ?Behavioral modification strategies: increasing lean protein intake, decreasing simple carbohydrates, meal planning and cooking strategies, keeping healthy foods in the home, and planning for success. ? ?[name] has agreed to follow-up with our clinic in 3 weeks. She was informed of the importance of frequent follow-up visits to maximize her success with intensive lifestyle modifications for her multiple health conditions.  ? ?Objective:  ? ?Blood pressure 132/84, pulse 76, temperature 98.3 ?F (36.8 ?C), height '5\' 4"'$  (1.626 m), weight 203 lb (92.1 kg), SpO2 97 %. ?Body mass index is 34.84 kg/m?. ? ?General: Cooperative, alert, well developed, in no acute distress. ?HEENT: Conjunctivae and lids unremarkable. ?Cardiovascular: Regular rhythm.  ?  Lungs: Normal work of breathing. ?Neurologic: No focal deficits.  ? ?Lab Results  ?Component Value Date  ? CREATININE 0.83 02/05/2022  ? BUN 18 02/05/2022  ? NA 140 02/05/2022  ? K 4.2 02/05/2022  ? CL 104 02/05/2022  ? CO2 27 02/05/2022  ? ?Lab Results   ?Component Value Date  ? ALT 20 02/05/2022  ? AST 15 02/05/2022  ? ALKPHOS 68 02/05/2022  ? BILITOT 0.5 02/05/2022  ? ?Lab Results  ?Component Value Date  ? HGBA1C 5.7 (H) 12/17/2021  ? HGBA1C 5.5 09/11/2021  ? HGBA1C 5.8 (H) 04/11/2021  ? HGBA1C 5.6 11/09/2020  ? HGBA1C 5.6 07/11/2020  ? ?Lab Results  ?Component Value Date  ? INSULIN 23.1 12/17/2021  ? INSULIN 27.4 (H) 09/11/2021  ? INSULIN 16.7 04/11/2021  ? INSULIN 23.9 11/09/2020  ? INSULIN 25.7 (H) 07/11/2020  ? ?Lab Results  ?Component Value Date  ? TSH 0.285 (L) 07/11/2020  ? ?Lab Results  ?Component Value Date  ? CHOL 259 (H) 09/11/2021  ? HDL 51 09/11/2021  ? LDLCALC 185 (H) 09/11/2021  ? TRIG 127 09/11/2021  ? CHOLHDL 5.1 (H) 09/11/2021  ? ?Lab Results  ?Component Value Date  ? VD25OH 54.7 12/17/2021  ? VD25OH 58.5 09/11/2021  ? VD25OH 50.1 04/11/2021  ? ?Lab Results  ?Component Value Date  ? WBC 7.3 02/05/2022  ? HGB 13.2 02/05/2022  ? HCT 40.5 02/05/2022  ? MCV 86.4 02/05/2022  ? PLT 351 02/05/2022  ? ?Attestation Statements:  ? ?Reviewed by clinician on day of visit: allergies, medications, problem list, medical history, surgical history, family history, social history, and previous encounter notes. ? ?I, Water quality scientist, CMA, am acting as Location manager for Mina Marble, NP. ? ?I have reviewed the above documentation for accuracy and completeness, and I agree with the above. -  Collyn Selk d. Karysa Heft, NP-C ?

## 2022-02-28 ENCOUNTER — Other Ambulatory Visit (INDEPENDENT_AMBULATORY_CARE_PROVIDER_SITE_OTHER): Payer: Self-pay | Admitting: Adult Health

## 2022-02-28 ENCOUNTER — Encounter (INDEPENDENT_AMBULATORY_CARE_PROVIDER_SITE_OTHER): Payer: Self-pay | Admitting: Adult Health

## 2022-02-28 ENCOUNTER — Ambulatory Visit (INDEPENDENT_AMBULATORY_CARE_PROVIDER_SITE_OTHER): Payer: 59 | Admitting: Adult Health

## 2022-02-28 ENCOUNTER — Other Ambulatory Visit: Payer: Self-pay

## 2022-02-28 VITALS — BP 125/75 | HR 71 | Temp 98.0°F | Ht 64.0 in | Wt 208.0 lb

## 2022-02-28 DIAGNOSIS — R7303 Prediabetes: Secondary | ICD-10-CM | POA: Diagnosis not present

## 2022-02-28 DIAGNOSIS — E782 Mixed hyperlipidemia: Secondary | ICD-10-CM | POA: Diagnosis not present

## 2022-02-28 DIAGNOSIS — Z6835 Body mass index (BMI) 35.0-35.9, adult: Secondary | ICD-10-CM

## 2022-02-28 DIAGNOSIS — Z9189 Other specified personal risk factors, not elsewhere classified: Secondary | ICD-10-CM

## 2022-02-28 DIAGNOSIS — I10 Essential (primary) hypertension: Secondary | ICD-10-CM

## 2022-02-28 DIAGNOSIS — E669 Obesity, unspecified: Secondary | ICD-10-CM | POA: Diagnosis not present

## 2022-02-28 MED ORDER — ROSUVASTATIN CALCIUM 10 MG PO TABS: 10.0000 mg | ORAL_TABLET | ORAL | 0 refills | Status: DC

## 2022-02-28 MED ORDER — SAXENDA 18 MG/3ML ~~LOC~~ SOPN: PEN_INJECTOR | SUBCUTANEOUS | 0 refills | Status: AC

## 2022-02-28 NOTE — Progress Notes (Signed)
? ? ? ?Chief Complaint:  ? ?OBESITY ?Sierra Gray is here to discuss her progress with her obesity treatment plan along with follow-up of her obesity related diagnoses. Sierra Gray is on the Category 3 Plan and states she is following her eating plan approximately 25% of the time. Sierra Gray states she is not exercising regularly at this time. ? ?Today's visit was #: 26 ?Starting weight: 212 lbs ?Starting date: 07/11/2020 ?Today's weight: 208 lbs ?Today's date: 02/28/2022 ?Total lbs lost to date: 4 lbs ?Total lbs lost since last in-office visit: 0 ? ?Interim History:  ?On 02/07/2022, Sierra Gray started Crestor 10 mg - 2 tablets per week. ?Denies myalgias. ?She continues to avoid tobacco/vape use. ? ?Subjective:  ? ?1. Essential hypertension ?BP/HR at goal. ?She is on amlodipine 2.5 mg daily. ? ?2. Mixed hyperlipidemia ?Started on Crestor 10 mg - 1 tab every 3 days. ?Denies myalgias. ?She continues to avoid tobacco/vape use. ? ?3. Pre-diabetes ?Insurance denied Sierra Gray. ?Metformin ineffective.  She never restarted. ?Will try Saxenda. ?She denies family hx of MTC or personal hx of pancreatitis. ? ?4. At risk for constipation ?Sierra Gray is at increased risk for constipation due to taking Saxenda for obesity. ? ?Assessment/Plan:  ? ?1. Essential hypertension ?Continue amlodipine 2.5 mg daily. ?Check labs at next office visit. ? ?2. Mixed hyperlipidemia ?Check labs at next office visit. ?Refill Crestor 10 mg 1 tablet every 3 days. ? ?- Refill rosuvastatin (CRESTOR) 10 MG tablet; Take 1 tablet (10 mg total) by mouth every 3 (three) days.  Dispense: 12 tablet; Refill: 0 ? ?3. Pre-diabetes ?Check labs at next office visit. ? ?- Start GLP-1 therapy per titration schedule. ? ?4. At risk for constipation ?Sierra Gray was given approximately 15 minutes of counseling today regarding prevention of constipation. She was encouraged to increase water and fiber intake.  ? ?5. Obesity with current BMI 35.7 ? ?Sierra Gray is currently in the action stage of change. As such, her  goal is to continue with weight loss efforts. She has agreed to the Category 3 Plan.  ? ?Start Saxenda per titration. ? ?- Start Liraglutide -Weight Management (SAXENDA) 18 MG/3ML SOPN; Inject 0.6 mg into the skin daily for 14 days, THEN 1.2 mg daily for 14 days, THEN 1.8 mg daily for 14 days.  Dispense: 3 mL; Refill: 0 ? ?Check fasting labs at next office visit. ? ?Exercise goals:  As is. ? ?Behavioral modification strategies: increasing lean protein intake, decreasing simple carbohydrates, meal planning and cooking strategies, keeping healthy foods in the home, and planning for success. ? ?Sierra Gray has agreed to follow-up with our clinic in 3 weeks. She was informed of the importance of frequent follow-up visits to maximize her success with intensive lifestyle modifications for her multiple health conditions.  ? ?Objective:  ? ?Blood pressure 125/75, pulse 71, temperature 98 ?F (36.7 ?C), height '5\' 4"'$  (1.626 m), weight 208 lb (94.3 kg), SpO2 98 %. ?Body mass index is 35.7 kg/m?. ? ?General: Cooperative, alert, well developed, in no acute distress. ?HEENT: Conjunctivae and lids unremarkable. ?Cardiovascular: Regular rhythm.  ?Lungs: Normal work of breathing. ?Neurologic: No focal deficits.  ? ?Lab Results  ?Component Value Date  ? CREATININE 0.83 02/05/2022  ? BUN 18 02/05/2022  ? NA 140 02/05/2022  ? K 4.2 02/05/2022  ? CL 104 02/05/2022  ? CO2 27 02/05/2022  ? ?Lab Results  ?Component Value Date  ? ALT 20 02/05/2022  ? AST 15 02/05/2022  ? ALKPHOS 68 02/05/2022  ? BILITOT 0.5 02/05/2022  ? ?Lab  Results  ?Component Value Date  ? HGBA1C 5.7 (H) 12/17/2021  ? HGBA1C 5.5 09/11/2021  ? HGBA1C 5.8 (H) 04/11/2021  ? HGBA1C 5.6 11/09/2020  ? HGBA1C 5.6 07/11/2020  ? ?Lab Results  ?Component Value Date  ? INSULIN 23.1 12/17/2021  ? INSULIN 27.4 (H) 09/11/2021  ? INSULIN 16.7 04/11/2021  ? INSULIN 23.9 11/09/2020  ? INSULIN 25.7 (H) 07/11/2020  ? ?Lab Results  ?Component Value Date  ? TSH 0.285 (L) 07/11/2020  ? ?Lab Results   ?Component Value Date  ? CHOL 259 (H) 09/11/2021  ? HDL 51 09/11/2021  ? LDLCALC 185 (H) 09/11/2021  ? TRIG 127 09/11/2021  ? CHOLHDL 5.1 (H) 09/11/2021  ? ?Lab Results  ?Component Value Date  ? VD25OH 54.7 12/17/2021  ? VD25OH 58.5 09/11/2021  ? VD25OH 50.1 04/11/2021  ? ?Lab Results  ?Component Value Date  ? WBC 7.3 02/05/2022  ? HGB 13.2 02/05/2022  ? HCT 40.5 02/05/2022  ? MCV 86.4 02/05/2022  ? PLT 351 02/05/2022  ? ?Attestation Statements:  ? ?Reviewed by clinician on day of visit: allergies, medications, problem list, medical history, surgical history, family history, social history, and previous encounter notes. ? ?I, Water quality scientist, CMA, am acting as Location manager for Mina Marble, NP. ? ?I have reviewed the above documentation for accuracy and completeness, and I agree with the above. -  Arcadia Gorgas d. Ketsia Linebaugh, NP-C ?

## 2022-03-27 ENCOUNTER — Ambulatory Visit (INDEPENDENT_AMBULATORY_CARE_PROVIDER_SITE_OTHER): Payer: 59 | Admitting: Adult Health

## 2022-03-27 ENCOUNTER — Encounter (INDEPENDENT_AMBULATORY_CARE_PROVIDER_SITE_OTHER): Payer: Self-pay | Admitting: Adult Health

## 2022-03-27 VITALS — BP 120/84 | HR 84 | Temp 97.8°F | Ht 64.0 in | Wt 202.0 lb

## 2022-03-27 DIAGNOSIS — E559 Vitamin D deficiency, unspecified: Secondary | ICD-10-CM

## 2022-03-27 DIAGNOSIS — R7303 Prediabetes: Secondary | ICD-10-CM

## 2022-03-27 DIAGNOSIS — E782 Mixed hyperlipidemia: Secondary | ICD-10-CM

## 2022-03-27 DIAGNOSIS — I10 Essential (primary) hypertension: Secondary | ICD-10-CM | POA: Diagnosis not present

## 2022-03-27 DIAGNOSIS — Z9189 Other specified personal risk factors, not elsewhere classified: Secondary | ICD-10-CM

## 2022-03-27 DIAGNOSIS — Z6834 Body mass index (BMI) 34.0-34.9, adult: Secondary | ICD-10-CM

## 2022-03-27 DIAGNOSIS — E669 Obesity, unspecified: Secondary | ICD-10-CM

## 2022-03-28 ENCOUNTER — Other Ambulatory Visit (INDEPENDENT_AMBULATORY_CARE_PROVIDER_SITE_OTHER): Payer: Self-pay | Admitting: Adult Health

## 2022-03-28 ENCOUNTER — Encounter (INDEPENDENT_AMBULATORY_CARE_PROVIDER_SITE_OTHER): Payer: Self-pay | Admitting: Adult Health

## 2022-03-28 DIAGNOSIS — E782 Mixed hyperlipidemia: Secondary | ICD-10-CM

## 2022-03-28 LAB — COMPREHENSIVE METABOLIC PANEL
ALT: 20 IU/L (ref 0–32)
AST: 19 IU/L (ref 0–40)
Albumin/Globulin Ratio: 2 (ref 1.2–2.2)
Albumin: 4.9 g/dL (ref 3.8–4.9)
Alkaline Phosphatase: 82 IU/L (ref 44–121)
BUN/Creatinine Ratio: 17 (ref 12–28)
BUN: 15 mg/dL (ref 8–27)
Bilirubin Total: 0.5 mg/dL (ref 0.0–1.2)
CO2: 24 mmol/L (ref 20–29)
Calcium: 10.5 mg/dL — ABNORMAL HIGH (ref 8.7–10.3)
Chloride: 102 mmol/L (ref 96–106)
Creatinine, Ser: 0.9 mg/dL (ref 0.57–1.00)
Globulin, Total: 2.5 g/dL (ref 1.5–4.5)
Glucose: 97 mg/dL (ref 70–99)
Potassium: 4.7 mmol/L (ref 3.5–5.2)
Sodium: 143 mmol/L (ref 134–144)
Total Protein: 7.4 g/dL (ref 6.0–8.5)
eGFR: 73 mL/min/{1.73_m2} (ref >59)

## 2022-03-28 LAB — HEMOGLOBIN A1C
Est. average glucose Bld gHb Est-mCnc: 114 mg/dL
Hgb A1c MFr Bld: 5.6 % (ref 4.8–5.6)

## 2022-03-28 LAB — LIPID PANEL
Chol/HDL Ratio: 3.9 ratio (ref 0.0–4.4)
Cholesterol, Total: 232 mg/dL — ABNORMAL HIGH (ref 100–199)
HDL: 59 mg/dL (ref >39)
LDL Chol Calc (NIH): 146 mg/dL — ABNORMAL HIGH (ref 0–99)
Triglycerides: 149 mg/dL (ref 0–149)
VLDL Cholesterol Cal: 27 mg/dL (ref 5–40)

## 2022-03-28 LAB — INSULIN, RANDOM: INSULIN: 18.6 u[IU]/mL (ref 2.6–24.9)

## 2022-03-28 LAB — VITAMIN D 25 HYDROXY (VIT D DEFICIENCY, FRACTURES): Vit D, 25-Hydroxy: 68.1 ng/mL (ref 30.0–100.0)

## 2022-03-28 MED ORDER — ROSUVASTATIN CALCIUM 10 MG PO TABS: 10.0000 mg | ORAL_TABLET | Freq: Every day | ORAL | 0 refills | Status: DC

## 2022-03-29 ENCOUNTER — Telehealth: Payer: Self-pay

## 2022-03-29 ENCOUNTER — Other Ambulatory Visit: Payer: Self-pay | Admitting: Hematology and Oncology

## 2022-03-29 DIAGNOSIS — Z1501 Genetic susceptibility to malignant neoplasm of breast: Secondary | ICD-10-CM

## 2022-03-29 NOTE — Telephone Encounter (Signed)
Called and left mammogram appt scheduled on 5/11 at 0910, arrive 15 mins early. Left radiology scheduling # and ask her to call to schedule CT on 7/25. Ask her to call the office back with questions. ?

## 2022-04-04 ENCOUNTER — Other Ambulatory Visit: Payer: Self-pay | Admitting: Hematology and Oncology

## 2022-04-04 DIAGNOSIS — Z1509 Genetic susceptibility to other malignant neoplasm: Secondary | ICD-10-CM

## 2022-04-08 ENCOUNTER — Other Ambulatory Visit (INDEPENDENT_AMBULATORY_CARE_PROVIDER_SITE_OTHER): Payer: Self-pay | Admitting: Adult Health

## 2022-04-08 DIAGNOSIS — I10 Essential (primary) hypertension: Secondary | ICD-10-CM

## 2022-04-08 NOTE — Progress Notes (Signed)
? ? ? ?Chief Complaint:  ? ?OBESITY ?Sierra Gray is here to discuss her progress with her obesity treatment plan along with follow-up of her obesity related diagnoses. Sierra Gray is on the Category 3 Plan and states she is following her eating plan approximately 15% of the time. Sierra Gray states she is walking 30-45 minutes 2 times per week. ? ?Today's visit was #: 110 ?Starting weight: 212 lbs ?Starting date: 07/11/2020 ?Today's weight: 202 lbs ?Today's date: 03/27/2022 ?Total lbs lost to date: 10 ?Total lbs lost since last in-office visit: 6 ? ?Interim History:  ?Sierra Gray is surprised to be down 6 lbs with only 15% compliance on Cat 3 since last office visit.  ?She is going to [facility] this weekend. ? ?Subjective:  ? ?1. Mixed hyperlipidemia ?On 02/07/22, Sierra Gray started on Crestor 10 mg twice weekly. ?She denies myalgias. ? ?2. Essential hypertension ?Sierra Gray's blood pressure and heart rate was excellent at office visit. ?She denies acute cardiac sx's. ? ?3. Pre-diabetes ?Unable to consistently take metformin daily. ?Been off for months. ? ?4. Vitamin D deficiency ?Sierra Gray is on OTCVit D3 2,000 IU, 2x, and multivitamin 1,000 IU= Vit D3, 5,000 IU daily.  ? ?5. At risk for heart disease ?Anjalina is at higher than average risk for cardiovascular disease due to obesity. ? ? ?Assessment/Plan:  ? ?1. Mixed hyperlipidemia ?We will check labs today. We will Gonzella Lex with lab results and refill Crestor for 90 day supply. ?- Lipid panel ? ?2. Essential hypertension ?We will check labs today ?- Comprehensive metabolic panel ? ?3. Pre-diabetes ?We will check labs today.  ?Sierra Gray will restart Metformin 500 mg daily, has medication at home. ? ?- Insulin, random ?- Hemoglobin A1c ? ?4. Vitamin D deficiency ?We will check labs today. ?- VITAMIN D 25 Hydroxy (Vit-D Deficiency, Fractures) ? ?5. At risk for heart disease ?Sierra Gray was given approximately 15 minutes of coronary artery disease prevention counseling today. She is 61 y.o. female and has risk  factors for heart disease including obesity. We discussed intensive lifestyle modifications today with an emphasis on specific weight loss instructions and strategies. ? ?Repetitive spaced learning was employed today to elicit superior memory formation and behavioral change.  ? ? ?6. Obesity with current BMI 34.7 ?Sierra Gray is currently in the action stage of change. As such, her goal is to continue with weight loss efforts. She has agreed to the Category 3 Plan.  ? ?Exercise goals: As is. ? ?Behavioral modification strategies: increasing lean protein intake, decreasing simple carbohydrates, meal planning and cooking strategies, keeping healthy foods in the home, and planning for success. ? ?Sierra Gray has agreed to follow-up with our clinic in 4 weeks. She was informed of the importance of frequent follow-up visits to maximize her success with intensive lifestyle modifications for her multiple health conditions.  ? ?Sierra Gray was informed we would discuss her lab results at her next visit unless there is a critical issue that needs to be addressed sooner. Sierra Gray agreed to keep her next visit at the agreed upon time to discuss these results. ? ?Objective:  ? ?Blood pressure 120/84, pulse 84, temperature 97.8 ?F (36.6 ?C), height '5\' 4"'$  (1.626 m), weight 202 lb (91.6 kg), SpO2 94 %. ?Body mass index is 34.67 kg/m?. ? ?General: Cooperative, alert, well developed, in no acute distress. ?HEENT: Conjunctivae and lids unremarkable. ?Cardiovascular: Regular rhythm.  ?Lungs: Normal work of breathing. ?Neurologic: No focal deficits.  ? ?Lab Results  ?Component Value Date  ? CREATININE 0.90 03/27/2022  ? BUN 15  03/27/2022  ? NA 143 03/27/2022  ? K 4.7 03/27/2022  ? CL 102 03/27/2022  ? CO2 24 03/27/2022  ? ?Lab Results  ?Component Value Date  ? ALT 20 03/27/2022  ? AST 19 03/27/2022  ? ALKPHOS 82 03/27/2022  ? BILITOT 0.5 03/27/2022  ? ?Lab Results  ?Component Value Date  ? HGBA1C 5.6 03/27/2022  ? HGBA1C 5.7 (H) 12/17/2021  ? HGBA1C 5.5  09/11/2021  ? HGBA1C 5.8 (H) 04/11/2021  ? HGBA1C 5.6 11/09/2020  ? ?Lab Results  ?Component Value Date  ? INSULIN 18.6 03/27/2022  ? INSULIN 23.1 12/17/2021  ? INSULIN 27.4 (H) 09/11/2021  ? INSULIN 16.7 04/11/2021  ? INSULIN 23.9 11/09/2020  ? ?Lab Results  ?Component Value Date  ? TSH 0.285 (L) 07/11/2020  ? ?Lab Results  ?Component Value Date  ? CHOL 232 (H) 03/27/2022  ? HDL 59 03/27/2022  ? LDLCALC 146 (H) 03/27/2022  ? TRIG 149 03/27/2022  ? CHOLHDL 3.9 03/27/2022  ? ?Lab Results  ?Component Value Date  ? VD25OH 68.1 03/27/2022  ? VD25OH 54.7 12/17/2021  ? VD25OH 58.5 09/11/2021  ? ?Lab Results  ?Component Value Date  ? WBC 7.3 02/05/2022  ? HGB 13.2 02/05/2022  ? HCT 40.5 02/05/2022  ? MCV 86.4 02/05/2022  ? PLT 351 02/05/2022  ? ?No results found for: IRON, TIBC, FERRITIN ? ?Attestation Statements:  ? ?Reviewed by clinician on day of visit: allergies, medications, problem list, medical history, surgical history, family history, social history, and previous encounter notes. ? ?I, Brendell Tyus, RMA, am acting as transcriptionist for Mina Marble, NP. ? ?I have reviewed the above documentation for accuracy and completeness, and I agree with the above. -  Ijeoma Loor d. Karlon Schlafer, NP-C ?

## 2022-04-18 ENCOUNTER — Ambulatory Visit: Payer: 59

## 2022-04-18 ENCOUNTER — Encounter (INDEPENDENT_AMBULATORY_CARE_PROVIDER_SITE_OTHER): Payer: Self-pay | Admitting: Adult Health

## 2022-04-18 ENCOUNTER — Ambulatory Visit (INDEPENDENT_AMBULATORY_CARE_PROVIDER_SITE_OTHER): Payer: 59 | Admitting: Adult Health

## 2022-04-18 VITALS — BP 117/78 | HR 75 | Temp 98.3°F | Ht 64.0 in | Wt 204.0 lb

## 2022-04-18 DIAGNOSIS — E782 Mixed hyperlipidemia: Secondary | ICD-10-CM

## 2022-04-18 DIAGNOSIS — R7303 Prediabetes: Secondary | ICD-10-CM

## 2022-04-18 DIAGNOSIS — Z9189 Other specified personal risk factors, not elsewhere classified: Secondary | ICD-10-CM

## 2022-04-18 DIAGNOSIS — Z6835 Body mass index (BMI) 35.0-35.9, adult: Secondary | ICD-10-CM

## 2022-04-18 DIAGNOSIS — I10 Essential (primary) hypertension: Secondary | ICD-10-CM

## 2022-04-18 DIAGNOSIS — E669 Obesity, unspecified: Secondary | ICD-10-CM

## 2022-04-18 DIAGNOSIS — E559 Vitamin D deficiency, unspecified: Secondary | ICD-10-CM

## 2022-04-18 MED ORDER — ROSUVASTATIN CALCIUM 10 MG PO TABS: 10.0000 mg | ORAL_TABLET | Freq: Every day | ORAL | 0 refills | Status: DC

## 2022-04-18 MED ORDER — AMLODIPINE BESYLATE 2.5 MG PO TABS: 2.5000 mg | ORAL_TABLET | Freq: Every day | ORAL | 0 refills | Status: DC

## 2022-04-18 MED ORDER — WEGOVY 0.25 MG/0.5ML ~~LOC~~ SOAJ: 0.2500 mg | SUBCUTANEOUS | 0 refills | Status: DC

## 2022-04-22 ENCOUNTER — Encounter (INDEPENDENT_AMBULATORY_CARE_PROVIDER_SITE_OTHER): Payer: Self-pay

## 2022-04-23 ENCOUNTER — Ambulatory Visit
Admission: RE | Admit: 2022-04-23 | Discharge: 2022-04-23 | Disposition: A | Discharge status: Home / Self Care | Payer: 59 | Source: Ambulatory Visit | Attending: Hematology and Oncology | Admitting: Hematology and Oncology

## 2022-04-23 DIAGNOSIS — Z1501 Genetic susceptibility to malignant neoplasm of breast: Secondary | ICD-10-CM

## 2022-04-23 NOTE — Progress Notes (Signed)
Chief Complaint:   OBESITY Sierra Gray is here to discuss her progress with her obesity treatment plan along with follow-up of her obesity related diagnoses. Sierra Gray is on the Category 3 Plan and states she is following her eating plan approximately 25% of the time. Sierra Gray states she is not currently exercising. Today's visit was #: 28 Starting weight: 212 lbs Starting date: 07/11/2020 Today's weight: 204 lbs Today's date: 04/18/22 Total lbs lost to date: 8 Total lbs lost since last in-office visit: gained 2 lbs.  Interim History:  She has new insurance-began Apr 08, 2022-we will attempt [facility] approval again. Low compliance on plan 3 due to travel and work stress.  Subjective:   1. Pre-diabetes Unable to remember to take metformin daily. 03/26/2022 blood glucose 97, A1c 5.6, insulin 18.6. She denies family hx of MTC or MENS 2.  She denies personal hx of pancreatitis. Discussed labs with patient today.  2. Essential hypertension BP/heart rate excellent at office visit. She denies lower extremity edema. Discussed labs with patient today.  3. Mixed hyperlipidemia She has been off Crestor 10 mg for 1 week. Denies acute cardiac symptoms.   03/27/2022 lipid panel-taking Crestor 10 mg 2 times per week at time of lab drawn - total cholesterol 232, LDL 146. Discussed labs with patient today.  4. At risk for heart disease Dorla is at risk for heart disease secondary to hyperlipidemia, hypertension, prediabetes, obesity.  5.  Vitamin D deficiency 03/27/2022 vitamin D level-62.1. She is on OTC vitamin D3 5000 IU (from two 2000 IU and one 1000 IU vitamin D3) Discussed labs with patient today.  Assessment/Plan:   1. Pre-diabetes Start GLP-1 therapy   2. Essential hypertension Refill: - amLODipine (NORVASC) 2.5 MG tablet; Take 1 tablet (2.5 mg total) by mouth daily.  Dispense: 90 tablet; Refill: 0  3. Mixed hyperlipidemia Refill: - rosuvastatin (CRESTOR) 10 MG tablet; Take 1 tablet  (10 mg total) by mouth daily.  Dispense: 90 tablet; Refill: 0  4. At risk for heart disease Sierra Gray was given approximately 15 minutes of coronary artery disease prevention counseling today. She is 61 y.o. female and has risk factors for heart disease including obesity. We discussed intensive lifestyle modifications today with an emphasis on specific weight loss instructions and strategies.  Repetitive spaced learning was employed today to elicit superior memory formation and behavioral change.    5.  Vitamin D deficiency Continue current vitamin D supplementation.  6. Obesity with current BMI 35.1 Handout: 100/200 snack calorie sheet Increase plan compliance to 80%.  Start Wegovy 0.25 mg once weekly, dispense 2 mL, no refills.  Sierra Gray is currently in the action stage of change. As such, her goal is to continue with weight loss efforts. She has agreed to the Category 3 Plan.   Exercise goals: All adults should avoid inactivity. Some physical activity is better than none, and adults who participate in any amount of physical activity gain some health benefits.  Behavioral modification strategies: increasing lean protein intake, decreasing simple carbohydrates, meal planning and cooking strategies, keeping healthy foods in the home, and planning for success.  Kelyse has agreed to follow-up with our clinic in 3 weeks. She was informed of the importance of frequent follow-up visits to maximize her success with intensive lifestyle modifications for her multiple health conditions.   Objective:   Blood pressure 117/78, pulse 75, temperature 98.3 F (36.8 C), height '5\' 4"'$  (1.626 m), weight 204 lb (92.5 kg), SpO2 99 %. Body mass index is  35.02 kg/m.  General: Cooperative, alert, well developed, in no acute distress. HEENT: Conjunctivae and lids unremarkable. Cardiovascular: Regular rhythm.  Lungs: Normal work of breathing. Neurologic: No focal deficits.   Lab Results  Component Value Date    CREATININE 0.90 03/27/2022   BUN 15 03/27/2022   NA 143 03/27/2022   K 4.7 03/27/2022   CL 102 03/27/2022   CO2 24 03/27/2022   Lab Results  Component Value Date   ALT 20 03/27/2022   AST 19 03/27/2022   ALKPHOS 82 03/27/2022   BILITOT 0.5 03/27/2022   Lab Results  Component Value Date   HGBA1C 5.6 03/27/2022   HGBA1C 5.7 (H) 12/17/2021   HGBA1C 5.5 09/11/2021   HGBA1C 5.8 (H) 04/11/2021   HGBA1C 5.6 11/09/2020   Lab Results  Component Value Date   INSULIN 18.6 03/27/2022   INSULIN 23.1 12/17/2021   INSULIN 27.4 (H) 09/11/2021   INSULIN 16.7 04/11/2021   INSULIN 23.9 11/09/2020   Lab Results  Component Value Date   TSH 0.285 (L) 07/11/2020   Lab Results  Component Value Date   CHOL 232 (H) 03/27/2022   HDL 59 03/27/2022   LDLCALC 146 (H) 03/27/2022   TRIG 149 03/27/2022   CHOLHDL 3.9 03/27/2022   Lab Results  Component Value Date   VD25OH 68.1 03/27/2022   VD25OH 54.7 12/17/2021   VD25OH 58.5 09/11/2021   Lab Results  Component Value Date   WBC 7.3 02/05/2022   HGB 13.2 02/05/2022   HCT 40.5 02/05/2022   MCV 86.4 02/05/2022   PLT 351 02/05/2022   No results found for: IRON, TIBC, FERRITIN  Attestation Statements:   Reviewed by clinician on day of visit: allergies, medications, problem list, medical history, surgical history, family history, social history, and previous encounter notes.  I, Georgianne Fick, FNP, am acting as Location manager for Mina Marble, NP.  I have reviewed the above documentation for accuracy and completeness, and I agree with the above. -  Joycelyn Liska d. Ashly Yepez, NP-C

## 2022-05-09 ENCOUNTER — Ambulatory Visit (INDEPENDENT_AMBULATORY_CARE_PROVIDER_SITE_OTHER): Payer: 59 | Admitting: Adult Health

## 2022-05-30 ENCOUNTER — Ambulatory Visit (INDEPENDENT_AMBULATORY_CARE_PROVIDER_SITE_OTHER): Payer: 59 | Admitting: Adult Health

## 2022-05-30 ENCOUNTER — Encounter (INDEPENDENT_AMBULATORY_CARE_PROVIDER_SITE_OTHER): Payer: Self-pay | Admitting: Adult Health

## 2022-05-30 VITALS — BP 136/86 | HR 74 | Temp 98.5°F | Ht 64.0 in | Wt 206.0 lb

## 2022-05-30 DIAGNOSIS — E669 Obesity, unspecified: Secondary | ICD-10-CM | POA: Diagnosis not present

## 2022-05-30 DIAGNOSIS — R7303 Prediabetes: Secondary | ICD-10-CM

## 2022-05-30 DIAGNOSIS — Z6835 Body mass index (BMI) 35.0-35.9, adult: Secondary | ICD-10-CM

## 2022-05-30 DIAGNOSIS — E782 Mixed hyperlipidemia: Secondary | ICD-10-CM

## 2022-05-30 DIAGNOSIS — I10 Essential (primary) hypertension: Secondary | ICD-10-CM

## 2022-06-03 ENCOUNTER — Telehealth: Payer: Self-pay

## 2022-06-03 NOTE — Progress Notes (Signed)
Chief Complaint:   OBESITY Sierra Gray is here to discuss her progress with her obesity treatment plan along with follow-up of her obesity related diagnoses. Sierra Gray is on the Category 3 Plan and states she is following her eating plan approximately (unknown)% of the time. Sierra Gray states she is walking for 40 minutes 3 times per week.  Today's visit was #: 12 Starting weight: 212 lbs Starting date: 07/11/2020 Today's weight: 206 lbs Today's date: 05/30/2022 Total lbs lost to date: 6 Total lbs lost since last in-office visit: 0  Interim History:  Since her last OV at [place]: Sierra Gray's mother suffered CVA-hospitalized 2 weeks-rehab to 3 weeks-will be discharged home tomorrow Sierra Gray, [place]).  Mother-residual deficiencies-left side fine motor, speech, able to walk with walker.   Patient would like to pause on office visits until August- she and her sister will be assisting her mother during her acute recovery.  Subjective:   1. Essential hypertension Sierra Gray's blood pressure is stable at her office visit today.   She is on amlodipine 2.5 mg daily. She denies lower extremity edema.  2. Pre-diabetes Sierra Gray's insurance denied [facility] coverage.  3. Mixed hyperlipidemia Sierra Gray is taking daily Crestor 10 mg, and she denies myalgias.  Assessment/Plan:   1. Essential hypertension Sierra Gray will continue her daily medication.  2. Pre-diabetes Sierra Gray will continue her category 3 meal plan.  3. Mixed hyperlipidemia Sierra Gray will continue her daily statin therapy.  4. Obesity with current BMI 35.4 Sierra Gray is currently in the action stage of change. As such, her goal is to continue with weight loss efforts. She has agreed to the Category 3 Plan and practicing portion control and making smarter food choices, such as increasing vegetables and decreasing simple carbohydrates.   Handout; 100/200 snack sheets/eating out guide.  Check fasting labs at next OV.  Exercise goals: As is.   Behavioral modification  strategies: increasing lean protein intake, decreasing simple carbohydrates, meal planning and cooking strategies, keeping healthy foods in the home, and planning for success.  Sierra Gray has agreed to follow-up with our clinic in 8 to 10 weeks. She was informed of the importance of frequent follow-up visits to maximize her success with intensive lifestyle modifications for her multiple health conditions.   Objective:   Blood pressure 136/86, pulse 74, temperature 98.5 F (36.9 C), height '5\' 4"'$  (1.626 m), weight 206 lb (93.4 kg), SpO2 99 %. Body mass index is 35.36 kg/m.  General: Cooperative, alert, well developed, in no acute distress. HEENT: Conjunctivae and lids unremarkable. Cardiovascular: Regular rhythm.  Lungs: Normal work of breathing. Neurologic: No focal deficits.   Lab Results  Component Value Date   CREATININE 0.90 03/27/2022   BUN 15 03/27/2022   NA 143 03/27/2022   K 4.7 03/27/2022   CL 102 03/27/2022   CO2 24 03/27/2022   Lab Results  Component Value Date   ALT 20 03/27/2022   AST 19 03/27/2022   ALKPHOS 82 03/27/2022   BILITOT 0.5 03/27/2022   Lab Results  Component Value Date   HGBA1C 5.6 03/27/2022   HGBA1C 5.7 (H) 12/17/2021   HGBA1C 5.5 09/11/2021   HGBA1C 5.8 (H) 04/11/2021   HGBA1C 5.6 11/09/2020   Lab Results  Component Value Date   INSULIN 18.6 03/27/2022   INSULIN 23.1 12/17/2021   INSULIN 27.4 (H) 09/11/2021   INSULIN 16.7 04/11/2021   INSULIN 23.9 11/09/2020   Lab Results  Component Value Date   TSH 0.285 (L) 07/11/2020   Lab Results  Component Value  Date   CHOL 232 (H) 03/27/2022   HDL 59 03/27/2022   LDLCALC 146 (H) 03/27/2022   TRIG 149 03/27/2022   CHOLHDL 3.9 03/27/2022   Lab Results  Component Value Date   VD25OH 68.1 03/27/2022   VD25OH 54.7 12/17/2021   VD25OH 58.5 09/11/2021   Lab Results  Component Value Date   WBC 7.3 02/05/2022   HGB 13.2 02/05/2022   HCT 40.5 02/05/2022   MCV 86.4 02/05/2022   PLT 351  02/05/2022   No results found for: "IRON", "TIBC", "FERRITIN"  Attestation Statements:   Reviewed by clinician on day of visit: allergies, medications, problem list, medical history, surgical history, family history, social history, and previous encounter notes.   Wilhemena Durie, am acting as transcriptionist for Mina Marble, NP.  I have reviewed the above documentation for accuracy and completeness, and I agree with the above. -  Rakesh Dutko d. Roderic Lammert, NP-C

## 2022-06-06 ENCOUNTER — Encounter (INDEPENDENT_AMBULATORY_CARE_PROVIDER_SITE_OTHER): Payer: Self-pay | Admitting: Adult Health

## 2022-06-13 ENCOUNTER — Telehealth: Payer: Self-pay

## 2022-06-13 NOTE — Telephone Encounter (Signed)
-----   Message from Heath Lark, MD sent at 06/13/2022 12:21 PM EDT ----- Pls remind her to schedule CT scan to be done on 7/25

## 2022-06-13 NOTE — Telephone Encounter (Signed)
Called back and given appt for CT on 7/25 at 10 am at [facility] location, NPO 4 hours prior to CT, pick up contrast prior to 7/25, drink the first bottle at 8 am and 2 nd bottle at 9 am. Reminded of other appts. She verbalized understanding.

## 2022-06-13 NOTE — Telephone Encounter (Signed)
Called and reminded to schedule CT on 7/25. She will call to schedule. Her Mom had a stroke and she has been trying to take care of her.

## 2022-07-02 ENCOUNTER — Inpatient Hospital Stay: Payer: 59 | Attending: Hematology and Oncology

## 2022-07-02 ENCOUNTER — Ambulatory Visit (INDEPENDENT_AMBULATORY_CARE_PROVIDER_SITE_OTHER): Payer: 59

## 2022-07-02 ENCOUNTER — Other Ambulatory Visit: Payer: Self-pay

## 2022-07-02 DIAGNOSIS — N951 Menopausal and female climacteric states: Secondary | ICD-10-CM | POA: Insufficient documentation

## 2022-07-02 DIAGNOSIS — Z79899 Other long term (current) drug therapy: Secondary | ICD-10-CM | POA: Diagnosis not present

## 2022-07-02 DIAGNOSIS — Z1501 Genetic susceptibility to malignant neoplasm of breast: Secondary | ICD-10-CM

## 2022-07-02 DIAGNOSIS — C562 Malignant neoplasm of left ovary: Secondary | ICD-10-CM

## 2022-07-02 DIAGNOSIS — Z800 Family history of malignant neoplasm of digestive organs: Secondary | ICD-10-CM | POA: Diagnosis not present

## 2022-07-02 DIAGNOSIS — Z1509 Genetic susceptibility to other malignant neoplasm: Secondary | ICD-10-CM

## 2022-07-02 DIAGNOSIS — Z1502 Genetic susceptibility to malignant neoplasm of ovary: Secondary | ICD-10-CM | POA: Insufficient documentation

## 2022-07-02 DIAGNOSIS — Z1589 Genetic susceptibility to other disease: Secondary | ICD-10-CM

## 2022-07-02 DIAGNOSIS — Z8543 Personal history of malignant neoplasm of ovary: Secondary | ICD-10-CM | POA: Diagnosis not present

## 2022-07-02 LAB — COMPREHENSIVE METABOLIC PANEL
ALT: 21 U/L (ref 0–44)
AST: 17 U/L (ref 15–41)
Albumin: 4.6 g/dL (ref 3.5–5.0)
Alkaline Phosphatase: 72 U/L (ref 38–126)
Anion gap: 9 (ref 5–15)
BUN: 14 mg/dL (ref 6–20)
CO2: 26 mmol/L (ref 22–32)
Calcium: 9.8 mg/dL (ref 8.9–10.3)
Chloride: 105 mmol/L (ref 98–111)
Creatinine, Ser: 1.01 mg/dL — ABNORMAL HIGH (ref 0.44–1.00)
GFR, Estimated: >60 mL/min (ref >60)
Glucose, Bld: 125 mg/dL — ABNORMAL HIGH (ref 70–99)
Potassium: 4.2 mmol/L (ref 3.5–5.1)
Sodium: 140 mmol/L (ref 135–145)
Total Bilirubin: 0.5 mg/dL (ref 0.3–1.2)
Total Protein: 7.3 g/dL (ref 6.5–8.1)

## 2022-07-02 LAB — CBC WITH DIFFERENTIAL/PLATELET
Abs Immature Granulocytes: 0.01 K/uL (ref 0.00–0.07)
Basophils Absolute: 0.1 K/uL (ref 0.0–0.1)
Basophils Relative: 1 %
Eosinophils Absolute: 0.3 K/uL (ref 0.0–0.5)
Eosinophils Relative: 5 %
HCT: 42.2 % (ref 36.0–46.0)
Hemoglobin: 14 g/dL (ref 12.0–15.0)
Immature Granulocytes: 0 %
Lymphocytes Relative: 31 %
Lymphs Abs: 2 K/uL (ref 0.7–4.0)
MCH: 28.7 pg (ref 26.0–34.0)
MCHC: 33.2 g/dL (ref 30.0–36.0)
MCV: 86.5 fL (ref 80.0–100.0)
Monocytes Absolute: 0.6 K/uL (ref 0.1–1.0)
Monocytes Relative: 9 %
Neutro Abs: 3.6 K/uL (ref 1.7–7.7)
Neutrophils Relative %: 54 %
Platelets: 361 K/uL (ref 150–400)
RBC: 4.88 MIL/uL (ref 3.87–5.11)
RDW: 13.7 % (ref 11.5–15.5)
WBC: 6.6 K/uL (ref 4.0–10.5)
nRBC: 0 % (ref 0.0–0.2)

## 2022-07-02 MED ORDER — IOHEXOL 300 MG/ML  SOLN: 100.0000 mL | Freq: Once | INTRAMUSCULAR | Status: DC | PRN

## 2022-07-02 MED ORDER — IOHEXOL 300 MG/ML  SOLN
100.0000 mL | Freq: Once | INTRAMUSCULAR | Status: AC | PRN
  Administered 2022-07-02: 100 mL via INTRAVENOUS

## 2022-07-03 LAB — CA 125: Cancer Antigen (CA) 125: 10.7 U/mL (ref 0.0–38.1)

## 2022-07-04 ENCOUNTER — Other Ambulatory Visit: Payer: Self-pay

## 2022-07-04 ENCOUNTER — Inpatient Hospital Stay: Payer: 59 | Admitting: Hematology and Oncology

## 2022-07-04 ENCOUNTER — Encounter: Payer: Self-pay | Admitting: Hematology and Oncology

## 2022-07-04 VITALS — BP 144/90 | HR 90 | Temp 97.9°F | Resp 18 | Ht 64.0 in | Wt 208.4 lb

## 2022-07-04 DIAGNOSIS — Z1502 Genetic susceptibility to malignant neoplasm of ovary: Secondary | ICD-10-CM | POA: Diagnosis not present

## 2022-07-04 DIAGNOSIS — Z1589 Genetic susceptibility to other disease: Secondary | ICD-10-CM

## 2022-07-04 DIAGNOSIS — Z1509 Genetic susceptibility to other malignant neoplasm: Secondary | ICD-10-CM

## 2022-07-04 DIAGNOSIS — C562 Malignant neoplasm of left ovary: Secondary | ICD-10-CM | POA: Diagnosis not present

## 2022-07-04 DIAGNOSIS — R232 Flushing: Secondary | ICD-10-CM

## 2022-07-04 DIAGNOSIS — Z1501 Genetic susceptibility to malignant neoplasm of breast: Secondary | ICD-10-CM | POA: Diagnosis not present

## 2022-07-04 NOTE — Progress Notes (Signed)
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 She is at high risk of breast cancer Based on current guidelines, she will need screening modality every 6 months, mammogram alternate with MRI She will be due for MRI November of this year I will get it scheduled closer to the time  Hot flashes We discussed medical management for hot flashes including low-dose antidepressants and non-pharmacological options such as moderate intensity exercise The patient decline medical intervention right now  Orders Placed This Encounter  Procedures   MR BREAST BILATERAL W WO CONTRAST INC CAD    Standing Status:   Future    Standing Expiration Date:   07/05/2023    Order Specific Question:   If indicated for the ordered procedure, I authorize the administration of contrast media per Radiology protocol    Answer:   Yes    Order Specific Question:   What is the patient's sedation requirement?    Answer:   No Sedation    Order Specific Question:   Does the patient have a pacemaker or implanted devices?    Answer:   No    Order Specific Question:   Preferred imaging location?    Answer:   [facility] (table limit - 550 lbs)    All questions were answered. The patient knows to call the clinic with any problems, questions or concerns. The total time spent in the appointment was 20 minutes encounter with patients including review of chart and various tests results, discussions about plan of care and coordination of care plan   Heath Lark, MD 07/04/2022 8:55 AM  INTERVAL  HISTORY: Please see below for problem oriented charting. she returns for treatment follow-up on Arimidex for breast cancer prevention and close surveillance due to high risk predisposition for recurrent cancer from ATM gene mutation She complained of hot flashes from Arimidex Otherwise, she have no new symptoms to suggest cancer recurrence We discussed management for hot flashes and the role of prophylactic bilateral mastectomy  REVIEW OF SYSTEMS:   Constitutional: Denies fevers, chills or abnormal weight loss Eyes: Denies blurriness of vision Ears, nose, mouth, throat, and face: Denies mucositis or sore throat Respiratory: Denies cough, dyspnea or wheezes Cardiovascular: Denies palpitation, chest discomfort or lower extremity swelling Gastrointestinal:  Denies nausea, heartburn or change in bowel habits Skin: Denies abnormal skin rashes Lymphatics: Denies new lymphadenopathy or easy bruising Neurological:Denies numbness, tingling or new weaknesses Behavioral/Psych: Mood is stable, no new changes  All other systems were reviewed with the patient and are negative.  I have reviewed the past medical history, past surgical history, social history and family history with the patient and they are unchanged from previous note.  ALLERGIES:  is allergic to bee venom, chlorhexidine, paclitaxel, bacitracin, iodine, and other.  MEDICATIONS:  Current Outpatient Medications  Medication Sig Dispense Refill   amLODipine (NORVASC) 2.5 MG tablet Take 1 tablet (2.5 mg total) by mouth daily. 90 tablet 0   anastrozole (ARIMIDEX) 1 MG tablet TAKE 1 TABLET BY MOUTH EVERY DAY 90 tablet 8   Apple Cider Vinegar 600 MG CAPS Take  1,200 mg by mouth 2 (two) times daily. Takes in morning and in evening.     Ascorbic Acid (VITAMIN C) 1000 MG tablet Take 1,000 mg by mouth daily.     cetirizine (ZYRTEC) 10 MG tablet Take 10 mg by mouth daily.     Cholecalciferol (VITAMIN D3) 125 MCG (5000 UT) CAPS Take 3,000 Units by  mouth daily.      Cinnamon 500 MG TABS Take 2,000 mg by mouth 2 (two) times daily.     ibuprofen (ADVIL,MOTRIN) 200 MG tablet Take 400 mg by mouth every 6 (six) hours as needed.     Inulin (FIBER CHOICE PO) Take 10 g by mouth daily.     Liniments (SALONPAS PAIN RELIEF PATCH EX) Place 1 patch onto the skin daily as needed (knee pain).     Melatonin 5 MG TABS Take 5 mg by mouth daily.      Multiple Vitamins-Minerals (HAIR SKIN & NAILS ADVANCED PO) Take 1 tablet by mouth daily.     Probiotic Product (PROBIOTIC DAILY PO) Take by mouth.     rosuvastatin (CRESTOR) 10 MG tablet Take 1 tablet (10 mg total) by mouth daily. 90 tablet 0   Turmeric 500 MG TABS Take 500 mg by mouth daily.     Vitamin E 100 units TABS Take by mouth.     No current facility-administered medications for this visit.    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 Soft tissue, biopsy, right medial thigh MATURE LIPOMA 3. Ovary and fallopian tube, right ENDOMETRIOMA AND SIMPLE SEROUS CYST WITH FALLOPIAN TUBE ADHESION NEGATIVE FOR MALIGNANCY 4. Omentum, resection for tumor BENIGN OMENTUM NEGATIVE FOR CARCINOMA 5. Lymph nodes, regional resection, right pelvic EIGHT BENIGN LYMPH NODES (0/8) 6. Lymph nodes, regional resection, left pelvic SEVEN BENIGN LYMPH NODES (0/7) 7. Lymph node, biopsy, right para-aortic FOUR BENIGN LYMPH NODES (0/4) 8.  Lymph node, biopsy, left para-aortic ONE BENIGN LYMPH NODE (0/1) 9. Peritoneum, biopsy, left abdominal BENIGN FIBROMUSCULAR TISSUE 10. Peritoneum, biopsy, right abdominal BENIGN FIBROMUSCULAR TISSUE WITH SEROSITIS 11. Peritoneum, biopsy, pelvic FIBROADIPOSE TISSUE WITH SEROSITIS  Specimen(s): Ovary and fallopian tube Procedure: (including lymph node sampling): salpingo-oophorectomy Primary tumor site (including laterality): Left ovary Ovarian surface involvement: Yes Ovarian capsule intact without fragmentation: intact Maximum tumor size (cm): 20.0 cm Histologic type: Endometrioid carcinoma Grade: 1 Peritoneal implants: (specify invasive or non-invasive): Negative Pelvic extension (list additional structures on separate lines and if involved): Negative Lymph nodes: number examined 20 ; number positive 0 TNM code: pT1c2, pN0, pMx FIGO Stage (based on pathologic findings, needs clinical correlation): IC2   09/11/2017 Surgery   Preoperative Diagnosis: 1. left adnexal mass.   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    Procedure(s) Performed: 1. Exploratory laparotomy with bilateral salpingo-oophorectomy, pelvic and para-aortic lymph node dissection, omentectomy and radical debulking for ovarian cancer (CPT [phone])   Surgeon: Everitt Amber, M.D.  Operative Findings:20 cm left  ovarian mass.   No intraperitoneal rupture occurred;  Frozen pathology was consistent with adenocarcinoma; right tube and ovary normal in appearance; 3) normal bilateral pelvic and para-aortic lymph nodes and omentum; small and large bowel to palpation. This represented an optimal cytoreduction with no gross visible disease remaining.    10/09/2017 Tumor Marker   Patient's tumor was tested for the following markers: CA-125 Results of the tumor marker test revealed 68.7   10/15/2017 Adverse Reaction   She developed reaction to Paclitaxel.   10/15/2017 - 01/29/2018  Chemotherapy   She received carboplatin. Due to infusion reaction to Taxol with cycle 1, treatment was switched to carboplatin and Abraxane   11/03/2017 Procedure   Successful placement of a right internal jugular approach power injectable Port-A-Cath. The catheter is ready for immediate use.   11/27/2017 Tumor Marker   Patient's tumor was tested for the following markers: CA-125 Results of the tumor marker test revealed 27.7   12/12/2017 Genetic Testing   The patient had genetic testing due to a personal history of ovarian cancer and a family history of pancreatic cancer (and possibly breast cancer).  The [facility] Hereditary Cancer Panel + tumor HRD analysis was ordered from the laboratory [facility]. The [facility] gene panel offered by [employer] includes sequencing and deletion/duplication testing of the following 29 genes: APC, ATM, BARD1, BMPR1A, BRCA1, BRCA2, BRIP1, CHD1, CDK4, CDKN2A, CHEK2, EPCAM (large rearrangement only), HOXB13, MLH1, MSH2, MSH6, MUTYH, NBN, PALB2, PMS2, PTEN, RAD51C, RAD51D, SMAD4, STK11, and TP53. Sequencing was performed for select regions of POLE and POLD1, and large rearrangement analysis was performed for select regions of GREM1.  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 [id number] (p.[id number]).  The date of this test report is 12/12/2017.   01/08/2018 Tumor Marker   Patient's tumor was tested for the following markers: CA-125 Results of the tumor marker test revealed 17.5   03/02/2018 Tumor Marker   Patient's tumor was tested for the following markers: CA-125 Results of the tumor marker test revealed 11.4   03/14/2018 Imaging   MRCP 1. No worrisome pancreatic lesions are currently present. 2. Cholelithiasis. 3. Small benign-appearing bilateral renal cysts. 4. Midline anterior abdominal wall laxity seen on coronal images, potential small hernia in this vicinity without observed complication   01/11/3360 Procedure    Successful right IJ vein Port-A-Cath explant.   12/21/2019 Tumor Marker   Patient's tumor was tested for the following markers: CA-125 Results of the tumor marker test revealed 9.5.   06/19/2020 Tumor Marker   Patient's tumor was tested for the following markers: CA-125 Results of the tumor marker test revealed 12.5   09/01/2020 Imaging   The BMD measured at Femur Neck Left is 0.866 g/cm2 with a T-score of -1.2. This patient is considered osteopenic according to [facility]) criteria   12/19/2020 Tumor Marker   Patient's tumor was tested for the following markers: CA-125. Results of the tumor marker test revealed 10.6   07/04/2021 Imaging   1. No evidence of metastatic disease in the abdomen or pelvis. 2. Signs of hysterectomy and pelvic and abdominal lymphadenectomy. 3. Stable 9 mm low-attenuation focus along the margin of the RIGHT hepatic lobe, unchanged since September of 2018. Likely benign process such as atypical hemangioma. 4. Cholelithiasis 5. Colonic diverticulosis and diverticular disease mainly in the descending and proximal sigmoid colon. 6. Aortic atherosclerosis.   Aortic Atherosclerosis (ICD10-I70.0).     07/02/2022 Imaging   Stable exam. No evidence of malignancy or  other acute findings within the abdomen or pelvis. Colonic diverticulosis, without radiographic evidence of diverticulitis.   Aortic Atherosclerosis (ICD10-I70.0).     07/03/2022 Tumor Marker   Patient's tumor was tested for the following markers: CA-125. Results of the tumor marker test revealed 10.7.     PHYSICAL EXAMINATION: ECOG PERFORMANCE STATUS: 0 - Asymptomatic  Vitals:   07/04/22 0848  BP: (!) 144/90  Pulse: 90  Resp: 18  Temp: 97.9 F (36.6 C)  SpO2: 99%   Filed Weights   07/04/22 0848  Weight: 208 lb 6.4 oz (94.5 kg)    GENERAL:alert, no distress and comfortable NEURO: alert & oriented x 3 with fluent speech, no focal motor/sensory deficits  LABORATORY  DATA:  I have reviewed the data as listed    Component Value Date/Time   NA 140 07/02/2022 0839   NA 143 03/27/2022 0928   NA 141 11/27/2017 0818   K 4.2 07/02/2022 0839   K 3.8 11/27/2017 0818   CL 105 07/02/2022 0839   CO2 26 07/02/2022 0839   CO2 25 11/27/2017 0818   GLUCOSE 125 (H) 07/02/2022 0839   GLUCOSE 90 11/27/2017 0818   BUN 14 07/02/2022 0839   BUN 15 03/27/2022 0928   BUN 12.9 11/27/2017 0818   CREATININE 1.01 (H) 07/02/2022 0839   CREATININE 0.8 11/27/2017 0818   CALCIUM 9.8 07/02/2022 0839   CALCIUM 9.9 11/27/2017 0818   PROT 7.3 07/02/2022 0839   PROT 7.4 03/27/2022 0928   PROT 7.0 11/27/2017 0818   ALBUMIN 4.6 07/02/2022 0839   ALBUMIN 4.9 03/27/2022 0928   ALBUMIN 3.8 11/27/2017 0818   AST 17 07/02/2022 0839   AST 11 11/27/2017 0818   ALT 21 07/02/2022 0839   ALT 11 11/27/2017 0818   ALKPHOS 72 07/02/2022 0839   ALKPHOS 68 11/27/2017 0818   BILITOT 0.5 07/02/2022 0839   BILITOT 0.5 03/27/2022 0928   BILITOT 0.41 11/27/2017 0818   GFRNONAA >60 07/02/2022 0839   GFRAA 77 11/09/2020 1005    No results found for: "SPEP", "UPEP"  Lab Results  Component Value Date   WBC 6.6 07/02/2022   NEUTROABS 3.6 07/02/2022   HGB 14.0 07/02/2022   HCT 42.2 07/02/2022   MCV 86.5 07/02/2022   PLT 361 07/02/2022      Chemistry      Component Value Date/Time   NA 140 07/02/2022 0839   NA 143 03/27/2022 0928   NA 141 11/27/2017 0818   K 4.2 07/02/2022 0839   K 3.8 11/27/2017 0818   CL 105 07/02/2022 0839   CO2 26 07/02/2022 0839   CO2 25 11/27/2017 0818   BUN 14 07/02/2022 0839   BUN 15 03/27/2022 0928   BUN 12.9 11/27/2017 0818   CREATININE 1.01 (H) 07/02/2022 0839   CREATININE 0.8 11/27/2017 0818      Component Value Date/Time   CALCIUM 9.8 07/02/2022 0839   CALCIUM 9.9 11/27/2017 0818   ALKPHOS 72 07/02/2022 0839   ALKPHOS 68 11/27/2017 0818   AST 17 07/02/2022 0839   AST 11 11/27/2017 0818   ALT 21 07/02/2022 0839   ALT 11 11/27/2017 0818    BILITOT 0.5 07/02/2022 0839   BILITOT 0.5 03/27/2022 0928   BILITOT 0.41 11/27/2017 0818       RADIOGRAPHIC STUDIES: I have personally reviewed the radiological images as listed and agreed with the findings in the report. CT ABDOMEN PELVIS W CONTRAST  Result Date: 07/02/2022 CLINICAL DATA:  Follow-up ovarian carcinoma. Previous TAH/BSO  and chemotherapy. 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 * Tracking Code: BO * EXAM: CT ABDOMEN AND PELVIS WITH CONTRAST TECHNIQUE: Multidetector CT imaging of the abdomen and pelvis was performed using the standard protocol following bolus administration of intravenous contrast. RADIATION DOSE REDUCTION: This exam was performed according to the departmental dose-optimization program which includes automated exposure control, adjustment of the mA and/or kV according to patient size and/or use of iterative reconstruction technique. CONTRAST:  163mL OMNIPAQUE IOHEXOL 300 MG/ML  SOLN COMPARISON:  07/03/2021 FINDINGS: Lower Chest: No acute findings. Hepatobiliary: 8 mm low-attenuation lesion in the lateral aspect of the right hepatic lobe remains stable. No new or enlarging liver lesions identified. Gallbladder is unremarkable. No evidence of biliary ductal dilatation. Pancreas:  No mass or inflammatory changes. Spleen: Within normal limits in size and appearance. Adrenals/Urinary Tract: No masses identified. Several benign-appearing renal cysts remain stable (no followup imaging recommended). No evidence of ureteral calculi or hydronephrosis. Stomach/Bowel: No evidence of obstruction, inflammatory process or abnormal fluid collections. Diverticulosis is seen mainly involving the descending and sigmoid colon, however there is no evidence of diverticulitis. Vascular/Lymphatic: No pathologically enlarged lymph nodes. No acute vascular findings. Aortic atherosclerotic calcification incidentally noted. Reproductive: Prior hysterectomy noted.  Adnexal regions are unremarkable in appearance. No evidence of mass, peritoneal thickening, or ascites. Other:  None. Musculoskeletal:  No suspicious bone lesions identified. IMPRESSION: Stable exam. No evidence of malignancy or other acute findings within the abdomen or pelvis. Colonic diverticulosis, without radiographic evidence of diverticulitis. Aortic Atherosclerosis (ICD10-I70.0). Electronically Signed   By: Marlaine Hind M.D.   On: 07/02/2022 14:43

## 2022-07-04 NOTE — Assessment & Plan Note (Signed)
Her last CT imaging show no evidence of disease Tumor marker is normal and she is not symptomatic Continue surveillance follow-up every few months I will see her back in November with tumor marker monitoring

## 2022-07-04 NOTE — Assessment & Plan Note (Signed)
She is at high risk of breast cancer Based on current guidelines, she will need screening modality every 6 months, mammogram alternate with MRI She will be due for MRI November of this year I will get it scheduled closer to the time

## 2022-07-04 NOTE — Assessment & Plan Note (Signed)
We discussed medical management for hot flashes including low-dose antidepressants and non-pharmacological options such as moderate intensity exercise The patient decline medical intervention right now 

## 2022-07-15 ENCOUNTER — Encounter (INDEPENDENT_AMBULATORY_CARE_PROVIDER_SITE_OTHER): Payer: Self-pay | Admitting: Adult Health

## 2022-07-16 ENCOUNTER — Other Ambulatory Visit (INDEPENDENT_AMBULATORY_CARE_PROVIDER_SITE_OTHER): Payer: Self-pay | Admitting: Adult Health

## 2022-07-16 ENCOUNTER — Encounter (INDEPENDENT_AMBULATORY_CARE_PROVIDER_SITE_OTHER): Payer: Self-pay | Admitting: Adult Health

## 2022-07-16 DIAGNOSIS — I10 Essential (primary) hypertension: Secondary | ICD-10-CM

## 2022-07-16 DIAGNOSIS — E782 Mixed hyperlipidemia: Secondary | ICD-10-CM

## 2022-07-16 MED ORDER — AMLODIPINE BESYLATE 2.5 MG PO TABS: 2.5000 mg | ORAL_TABLET | Freq: Every day | ORAL | 0 refills | Status: DC

## 2022-07-16 MED ORDER — ROSUVASTATIN CALCIUM 10 MG PO TABS: 10.0000 mg | ORAL_TABLET | Freq: Every day | ORAL | 0 refills | Status: DC

## 2022-07-16 NOTE — Telephone Encounter (Signed)
FYI

## 2022-07-17 ENCOUNTER — Encounter (INDEPENDENT_AMBULATORY_CARE_PROVIDER_SITE_OTHER): Payer: Self-pay

## 2022-07-25 ENCOUNTER — Ambulatory Visit (INDEPENDENT_AMBULATORY_CARE_PROVIDER_SITE_OTHER): Payer: 59 | Admitting: Adult Health

## 2022-08-01 ENCOUNTER — Encounter (INDEPENDENT_AMBULATORY_CARE_PROVIDER_SITE_OTHER): Payer: Self-pay | Admitting: Adult Health

## 2022-08-01 ENCOUNTER — Ambulatory Visit (INDEPENDENT_AMBULATORY_CARE_PROVIDER_SITE_OTHER): Payer: 59 | Admitting: Adult Health

## 2022-08-01 VITALS — BP 116/79 | HR 86 | Temp 98.5°F | Ht 64.0 in | Wt 203.0 lb

## 2022-08-01 DIAGNOSIS — I10 Essential (primary) hypertension: Secondary | ICD-10-CM | POA: Diagnosis not present

## 2022-08-01 DIAGNOSIS — E559 Vitamin D deficiency, unspecified: Secondary | ICD-10-CM | POA: Diagnosis not present

## 2022-08-01 DIAGNOSIS — R7303 Prediabetes: Secondary | ICD-10-CM | POA: Diagnosis not present

## 2022-08-01 DIAGNOSIS — E669 Obesity, unspecified: Secondary | ICD-10-CM

## 2022-08-01 DIAGNOSIS — E7849 Other hyperlipidemia: Secondary | ICD-10-CM

## 2022-08-01 DIAGNOSIS — E782 Mixed hyperlipidemia: Secondary | ICD-10-CM

## 2022-08-01 DIAGNOSIS — Z6835 Body mass index (BMI) 35.0-35.9, adult: Secondary | ICD-10-CM

## 2022-08-03 ENCOUNTER — Other Ambulatory Visit: Payer: Self-pay | Admitting: Hematology and Oncology

## 2022-08-03 LAB — HEMOGLOBIN A1C
Est. average glucose Bld gHb Est-mCnc: 117 mg/dL
Hgb A1c MFr Bld: 5.7 % — ABNORMAL HIGH (ref 4.8–5.6)

## 2022-08-03 LAB — LIPID PANEL
Chol/HDL Ratio: 3 ratio (ref 0.0–4.4)
Cholesterol, Total: 160 mg/dL (ref 100–199)
HDL: 53 mg/dL (ref >39)
LDL Chol Calc (NIH): 84 mg/dL (ref 0–99)
Triglycerides: 132 mg/dL (ref 0–149)
VLDL Cholesterol Cal: 23 mg/dL (ref 5–40)

## 2022-08-03 LAB — INSULIN, RANDOM: INSULIN: 46.3 u[IU]/mL — ABNORMAL HIGH (ref 2.6–24.9)

## 2022-08-03 LAB — VITAMIN D 25 HYDROXY (VIT D DEFICIENCY, FRACTURES): Vit D, 25-Hydroxy: 74.8 ng/mL (ref 30.0–100.0)

## 2022-08-03 NOTE — Progress Notes (Unsigned)
Chief Complaint:   OBESITY Sierra Gray is here to discuss her progress with her obesity treatment plan along with follow-up of her obesity related diagnoses. Sierra Gray is on the Category 3 Plan and states she is following her eating plan approximately 80% of the time. Sierra Gray states she is walking 30 minutes 6 times per week.  Today's visit was #: 41 Starting weight: 212 lbs Starting date: 07/11/2020 Today's weight: 203 lbs Today's date: 08/01/2022 Total lbs lost to date: 9 lbs Total lbs lost since last in-office visit: 3 lbs  Interim History: Her mother is stable in [place].  Mother suffered a CVA- May 2023.  Subjective:   1. Essential hypertension Blood pressure perfect at office visit.  She is on daily amlodipine 2.5 mg.  2. Other hyperlipidemia- She is currently on daily Crestor 10 mg.  She denies myalgia's.   3. Vitamin D deficiency She is using OTC Vitamin D-3, 5,000 IU.  4. Pre-diabetes Not currently taking any blood glucose lowering medication.   Assessment/Plan:   1. Essential hypertension Continue daily calcium channel blockers. (CCB)  2. Other hyperlipidemia Check labs.  - Lipid panel  3. Vitamin D deficiency Check labs.  - VITAMIN D 25 Hydroxy (Vit-D Deficiency, Fractures)  4. Pre-diabetes Check labs  - Hemoglobin A1c - Insulin, random  5. Obesity with current BMI 35.0 Sierra Gray is currently in the action stage of change. As such, her goal is to continue with weight loss efforts. She has agreed to practicing portion control and making smarter food choices, such as increasing vegetables and decreasing simple carbohydrates.   Exercise goals:  As is.   Behavioral modification strategies: increasing lean protein intake, decreasing simple carbohydrates, meal planning and cooking strategies, keeping healthy foods in the home, and planning for success.  Sierra Gray has agreed to follow-up with our clinic in 4 weeks. She was informed of the importance of frequent  follow-up visits to maximize her success with intensive lifestyle modifications for her multiple health conditions.   Sierra Gray was informed we would discuss her lab results at her next visit unless there is a critical issue that needs to be addressed sooner. Sierra Gray agreed to keep her next visit at the agreed upon time to discuss these results.  Objective:   Blood pressure 116/79, pulse 86, temperature 98.5 F (36.9 C), height '5\' 4"'$  (1.626 m), weight 203 lb (92.1 kg), SpO2 96 %. Body mass index is 34.84 kg/m.  General: Cooperative, alert, well developed, in no acute distress. HEENT: Conjunctivae and lids unremarkable. Cardiovascular: Regular rhythm.  Lungs: Normal work of breathing. Neurologic: No focal deficits.   Lab Results  Component Value Date   CREATININE 1.01 (H) 07/02/2022   BUN 14 07/02/2022   NA 140 07/02/2022   K 4.2 07/02/2022   CL 105 07/02/2022   CO2 26 07/02/2022   Lab Results  Component Value Date   ALT 21 07/02/2022   AST 17 07/02/2022   ALKPHOS 72 07/02/2022   BILITOT 0.5 07/02/2022   Lab Results  Component Value Date   HGBA1C 5.7 (H) 08/01/2022   HGBA1C 5.6 03/27/2022   HGBA1C 5.7 (H) 12/17/2021   HGBA1C 5.5 09/11/2021   HGBA1C 5.8 (H) 04/11/2021   Lab Results  Component Value Date   INSULIN 46.3 (H) 08/01/2022   INSULIN 18.6 03/27/2022   INSULIN 23.1 12/17/2021   INSULIN 27.4 (H) 09/11/2021   INSULIN 16.7 04/11/2021   Lab Results  Component Value Date   TSH 0.285 (L) 07/11/2020  Lab Results  Component Value Date   CHOL 160 08/01/2022   HDL 53 08/01/2022   LDLCALC 84 08/01/2022   TRIG 132 08/01/2022   CHOLHDL 3.0 08/01/2022   Lab Results  Component Value Date   VD25OH 74.8 08/01/2022   VD25OH 68.1 03/27/2022   VD25OH 54.7 12/17/2021   Lab Results  Component Value Date   WBC 6.6 07/02/2022   HGB 14.0 07/02/2022   HCT 42.2 07/02/2022   MCV 86.5 07/02/2022   PLT 361 07/02/2022   No results found for: "IRON", "TIBC",  "FERRITIN"  Attestation Statements:   Reviewed by clinician on day of visit: allergies, medications, problem list, medical history, surgical history, family history, social history, and previous encounter notes.  I, Davy Pique, RMA, am acting as Location manager for Mina Marble, NP.  I have reviewed the above documentation for accuracy and completeness, and I agree with the above. -  ***

## 2022-08-05 ENCOUNTER — Encounter: Payer: Self-pay | Admitting: Gynecologic Oncology

## 2022-08-06 ENCOUNTER — Inpatient Hospital Stay: Payer: 59 | Attending: Gynecologic Oncology | Admitting: Gynecologic Oncology

## 2022-08-06 ENCOUNTER — Inpatient Hospital Stay: Payer: 59

## 2022-08-06 ENCOUNTER — Other Ambulatory Visit: Payer: Self-pay

## 2022-08-06 VITALS — BP 144/86 | HR 83 | Temp 98.5°F | Resp 14 | Ht 64.0 in | Wt 209.0 lb

## 2022-08-06 DIAGNOSIS — C562 Malignant neoplasm of left ovary: Secondary | ICD-10-CM

## 2022-08-06 DIAGNOSIS — Z9221 Personal history of antineoplastic chemotherapy: Secondary | ICD-10-CM | POA: Diagnosis not present

## 2022-08-06 DIAGNOSIS — Z1501 Genetic susceptibility to malignant neoplasm of breast: Secondary | ICD-10-CM | POA: Diagnosis not present

## 2022-08-06 DIAGNOSIS — Z1502 Genetic susceptibility to malignant neoplasm of ovary: Secondary | ICD-10-CM | POA: Diagnosis not present

## 2022-08-06 DIAGNOSIS — Z8543 Personal history of malignant neoplasm of ovary: Secondary | ICD-10-CM | POA: Diagnosis present

## 2022-08-06 DIAGNOSIS — Z90710 Acquired absence of both cervix and uterus: Secondary | ICD-10-CM | POA: Insufficient documentation

## 2022-08-06 DIAGNOSIS — Z1509 Genetic susceptibility to other malignant neoplasm: Secondary | ICD-10-CM | POA: Insufficient documentation

## 2022-08-06 NOTE — Progress Notes (Unsigned)
Gynecologic Oncology Follow Up  Sierra Gray 61 y.o. 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 S/p adjuvant chemotherapy with 6 cycles carboplatin and paclitaxel completed 01/29/18. 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   Ovarian cancer history:  Complete clinical response with no evidence of recurrence on today's examination. Recommendation remains for continued 6 monthly surveillance visits with CA 125 draws until February, 2024. CA 125 will be checked today and she will be contacted with the results.    ATM mutation:  She sees Dr Alvy Bimler for prophylactic anastrazole for breast cancer risk reduction related to her ATM mutation. Pancreatic MRI for screening purposes was normal in April, 2019. Continue follow up as scheduled with Dr. Alvy Bimler.   We will also place a referral back to [place] for follow up with Dr. Ardis Hughs. Recommendations for primary care providers with phone numbers provided to the patient. Reportable signs and symptoms reviewed including return of abdominal pain and given in instructions on AVS. She is advised to call for any needs.  HPI: Sierra Gray is a 61 year old female, G1P0, initially seen in consultation at the request of Dr. Nicoletta Dress for a pelvic mass. In late August 2018, she developed abdominal pain which increased along with the development of a temperature of 102. The pain dissipated but returned intermittently. Due to the persistent pain, she sought evaluation with her PCP. She did report weight loss of 20-25 lbs over 6-9 months but this was intentional.    On 08/19/2017 with [facility], she underwent a CT scan which revealed a trace amount of ascites in the peritoneal cavity prominently seen in the lateral paracolic gutter and  along the liver capsule. There was no nodular peritoneal enhancement identified to suggest peritoneal carcinomatosis. A neoplastic mass that is cystic and solid replaces the entire uterus. It extends caudally through the cervix and appears to invade the upper one third of the vagina. The caudal margin of the neoplasm may directly invade the posterior bladder dome, though the mural thickness is normal. No fat separate the mass from the adjacent upper third of the rectum or from the mid sigmoid. The mass measures 12 x 13 x 20 cm in size and posteriorly laterally displaces the right ovary. The left ovary was not confidently identified. 1 cm low attenuation lesion of the liver. Of note, the patient had undergone a laparoscopic hysterectomy at [facility] approximate 14 years ago secondary to fibroids.  On 09/11/17, she underwent an exploratory laparotomy, BSO, omentectomy, pelvic and para-aortic lymphadenectomy, radical tumor debulking with Dr. Everitt Amber. Intraoperative findings were significant for a 20+cm left ovarian mass with normal right ovary and omentum. No extraovarian disease was seen. Frozen confirmed adenocarcinoma. There was no gross residual disease at completion of the surgery.  Final pathology confirmed a 20 cm grade 1 endometrioid left ovarian adenocarcinoma with surface involvement but negative washings (stage IC2). All other resected tissue (including omentum, nodes and peritoneal biopsy and washings) were negative. [facility] Tumor Analysis testing resulted on 12/24/2017 as negative for deleterious BRCA1 or BRCA2 mutation in tumor. Germline testing with [facility] as well reported on 12/12/2017 revealed an ATM mutation.  In accordance with NCCN guidelines, she was recommended adjuvant carboplatin and paclitaxel chemotherapy and her case was discussed at multi-disciplinary tumor conference.  She went on to complete adjuvant chemotherapy with 6 cycles of carboplatin and paclitaxel in February, 2019. She tolerated  therapy well and had a complete clinical response.   Since that time, she had a ventral abdominal hernia repair at [facility] in June, 2019. She had a screening colonoscopy on 07/15/2018 with Dr. Ardis Hughs with a frond-like/villous and fungating non-obstructing medium sized mass at the presumed hepatic flexure. Biopsy returned as a tubular adenoma and she was referred to general surgery. She then underwent laparoscopic assisted right hemicolectomy with Dr Hassell Done on 09/11/18. It showed benign tubular adenomas.  Previous tumor markers: On 08/31/18 CA 125 was normal at 10.  On 11/21/19 CA 125 was normal at 9.7. On 12/21/19 CA 125 was normal at 9.5. On 06/19/20 CA 125 was normal at 12.5.  On 12/19/20 CA 125 was normal at 10.6 On 06/19/21 CA 125 was normal at 10.4. On 10/22/21 CA 125 was normal at 11.6. On 02/05/22 CA 125 was normal at 10. On 07/02/22 CA 125 was normal at 10.7.  She saw Dr. Heath Lark in July 2023 and had a CT of the AP on 07/02/2022 as part of surveillance. The results returned as stable exam with no evidence of malignancy or other acute findings within the abdomen or pelvis. Colonic diverticulosis, without radiographic evidence of diverticulitis. Aortic Atherosclerosis.  Interval History: She presents today for continued follow-up.  She states she has been doing well from a health standpoint since her last visit. Her mother had a stroke in May of this year which has been challenging. She continues to [facility] on a [facility] basis.  She has a provided food plan from their office. She has been tolerating her diet with no nausea or emesis.  No unintentional weight loss, change in appetite, abdominal bloating, or early satiety reported.  Her bowels and bladder are functioning without issues. Previously reported intermittent left lower quadrant abdominal discomfort has resolved. Overall, no symptoms concerning for recurrent cancer. She does not have a PCP at this time and  feels she is getting a comprehensive workup with the [facility] currently. No concerns voiced.  Review of Systems: Constitutional: Feels well.  No fever, chills, unintentional weight loss or gain, change in appetite, early satiety. Cardiovascular: No chest pain, shortness of breath, or edema.  Pulmonary: No cough or wheeze.  Gastrointestinal: No abdominal pain. No nausea, vomiting, or diarrhea. No bright red blood per rectum or change in bowel movement.  Genitourinary: No frequency, urgency, or dysuria. No vaginal bleeding or discharge.  Musculoskeletal: No new myalgia or joint pain.  Neurologic: No new weakness, numbness, or change in gait.  Psychology: No new complaints related to depression, anxiety, or insomnia.  Health Maintenance: Mammogram: 04/23/2022, for breast MRI in November 2023, last in 10/2021 Pap Smear: N/A, past hx of hysterectomy Colonoscopy: Last 07/15/2018 with recommendation for repeat in 3 years Bone Density Scan: 09/01/2020-osteopenic Vit D Level: 08/01/2022 at 74.8 Lipid Panel: 08/01/2022 A1C: 08/01/22 at 5.7  Current Meds:  Outpatient Encounter Medications as of 08/06/2022  Medication Sig   amLODipine (NORVASC) 2.5 MG tablet Take 1 tablet (2.5 mg total) by mouth daily.   anastrozole (ARIMIDEX) 1 MG tablet TAKE 1 TABLET BY MOUTH EVERY DAY   Apple Cider Vinegar 600 MG CAPS Take 1,200 mg by mouth 2 (two) times daily. Takes in morning and in evening.   Ascorbic Acid (VITAMIN C) 1000 MG tablet Take 1,000 mg by mouth daily.  cetirizine (ZYRTEC) 10 MG tablet Take 10 mg by mouth daily.   Cholecalciferol (VITAMIN D3) 125 MCG (5000 UT) CAPS Take 3,000 Units by mouth daily.    Cinnamon 500 MG TABS Take 2,000 mg by mouth 2 (two) times daily.   ibuprofen (ADVIL,MOTRIN) 200 MG tablet Take 400 mg by mouth every 6 (six) hours as needed.   Inulin (FIBER CHOICE PO) Take 10 g by mouth daily.   Liniments (SALONPAS PAIN RELIEF PATCH EX) Place 1 patch onto the  skin daily as needed (knee pain).   magnesium 30 MG tablet Take 30 mg by mouth 2 (two) times daily.   Melatonin 5 MG TABS Take 5 mg by mouth daily.    Probiotic Product (PROBIOTIC DAILY PO) Take by mouth.   rosuvastatin (CRESTOR) 10 MG tablet Take 1 tablet (10 mg total) by mouth daily.   Turmeric 500 MG TABS Take 500 mg by mouth daily.   Vitamin E 100 units TABS Take by mouth.   [DISCONTINUED] Multiple Vitamins-Minerals (HAIR SKIN & NAILS ADVANCED PO) Take 1 tablet by mouth daily.   [DISCONTINUED] anastrozole (ARIMIDEX) 1 MG tablet TAKE 1 TABLET BY MOUTH EVERY DAY   No facility-administered encounter medications on file as of 08/06/2022.    Allergy:  Allergies  Allergen Reactions   Bee Venom Anaphylaxis   Chlorhexidine Rash    Surgical prep wash   Paclitaxel Other (See Comments)    Experienced severe back pain and flushing   Bacitracin Rash   Iodine Rash    Not IV contrast, per patient. tkv   Other Rash    Ortho prep SURGICAL GLUE--RASH, VERY BOTHERSOME, REQUIRED BENADRYL     Social Hx:   Social History   Socioeconomic History   Marital status: Single    Spouse name: Not on file   Number of children: Not on file   Years of education: Not on file   Highest education level: Not on file  Occupational History   Occupation: Freight forwarder  Tobacco Use   Smoking status: Never   Smokeless tobacco: Never  Vaping Use   Vaping Use: Never used  Substance and Sexual Activity   Alcohol use: Yes    Comment: Socially drinks wine/beer   Drug use: No   Sexual activity: Not Currently  Other Topics Concern   Not on file  Social History Narrative   Not on file   Social Determinants of Health   Financial Resource Strain: Not on file  Food Insecurity: Not on file  Transportation Needs: Not on file  Physical Activity: Not on file  Stress: Not on file  Social Connections: Not on file  Intimate Partner Violence: Not on file    Past Surgical Hx:  Past Surgical History:  Procedure  Laterality Date   ABDOMINAL HYSTERECTOMY  2009   Total laparoscopic hyst for fibroids   BILATERAL SALPINGECTOMY Bilateral 09/11/2017   Procedure: BILATERAL SALPINGECTOMY WITH OOPHERECTOMY;  Surgeon: Everitt Amber, MD;  Location: [facility];  Service: Gynecology;  Laterality: Bilateral;   BREAST BIOPSY  12/16/2014   2 lumps in  right breast removed   BREAST EXCISIONAL BIOPSY Left    BREAST EXCISIONAL BIOPSY Right    BREAST REDUCTION SURGERY     EXCISION OF SKIN TAG N/A 09/11/2017   Procedure: EXCISION OF SKIN TAG ON THIGH;  Surgeon: Everitt Amber, MD;  Location: [facility];  Service: Gynecology;  Laterality: N/A;   IR FLUORO GUIDE PORT INSERTION RIGHT  11/03/2017   IR REMOVAL TUN ACCESS W/  PORT W/O FL MOD SED  03/17/2018   IR US GUIDE VASC ACCESS RIGHT  11/03/2017   KNEE SURGERY Right    x3   LAPAROSCOPIC PARTIAL RIGHT COLECTOMY Right 09/11/2018   Procedure: LAPAROSCOPIC ASSISTED  PARTIAL RIGHT HEMI-COLECTOMY ERAS PATHWAY;  Surgeon: Johnathan Hausen, MD;  Location: [facility];  Service: General;  Laterality: Right;   LAPAROTOMY Bilateral 09/11/2017   Procedure: EXPLORATORY LAPAROTOMY;  Surgeon: Everitt Amber, MD;  Location: [facility];  Service: Gynecology;  Laterality: Bilateral;   LAPAROTOMY WITH STAGING N/A 09/11/2017   Procedure: PELVIC AND PARA AORTIC LYMPH NODE  DISSECTION;  Surgeon: Everitt Amber, MD;  Location: [facility];  Service: Gynecology;  Laterality: N/A;   OMENTECTOMY N/A 09/11/2017   Procedure: OMENTECTOMY;  Surgeon: Everitt Amber, MD;  Location: [facility];  Service: Gynecology;  Laterality: N/A;   REDUCTION MAMMAPLASTY     WISDOM TOOTH EXTRACTION      Past Medical Hx:  Past Medical History:  Diagnosis Date   Anemia    hx of   Anemia    Anxiety    Arthritis    oa knees   Back pain    Breast lump in female    benign   Constipation    Depression    Family history of pancreatic cancer 11/27/2017   Family history of prostate cancer 11/27/2017   Fibroids    Hyperlipidemia    Hypertension     Joint pain    Migraine    hx of    Osteoarthritis    Ovarian cancer ([place])    Pelvic mass in female    Pelvic mass in female    PONV (postoperative nausea and vomiting)    nausea only   Sleep apnea    SOB (shortness of breath)    Swelling of both lower extremities    Vitamin D deficiency     Oncology Hx:  Oncology History Overview Note  Genetic testing came back positive for ATM variant   Left ovarian epithelial cancer ([place])  08/19/2017 Imaging   She had outside CT scan which showed large abdominal mass   08/27/2017 Tumor Marker   Patient's tumor was tested for the following markers: CA-125 Results of the tumor marker test revealed 904.5   09/11/2017 Pathology Results   1. 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 Soft tissue, biopsy, right medial thigh MATURE LIPOMA 3. Ovary and fallopian tube, right ENDOMETRIOMA AND SIMPLE SEROUS CYST WITH FALLOPIAN TUBE ADHESION NEGATIVE FOR MALIGNANCY 4. Omentum, resection for tumor BENIGN OMENTUM NEGATIVE FOR CARCINOMA 5. Lymph nodes, regional resection, right pelvic EIGHT BENIGN LYMPH NODES (0/8) 6. Lymph nodes, regional resection, left pelvic SEVEN BENIGN LYMPH NODES (0/7) 7. Lymph node, biopsy, right para-aortic FOUR BENIGN LYMPH NODES (0/4) 8. Lymph node, biopsy, left para-aortic ONE BENIGN LYMPH NODE (0/1) 9. Peritoneum, biopsy, left abdominal BENIGN FIBROMUSCULAR TISSUE 10. Peritoneum, biopsy, right abdominal BENIGN FIBROMUSCULAR TISSUE WITH SEROSITIS 11. Peritoneum, biopsy, pelvic FIBROADIPOSE TISSUE WITH SEROSITIS  Specimen(s): Ovary and fallopian tube Procedure: (including lymph node sampling): salpingo-oophorectomy Primary tumor site (including laterality): Left ovary Ovarian surface involvement: Yes Ovarian capsule intact without  fragmentation: intact Maximum tumor size (cm): 20.0 cm Histologic type: Endometrioid carcinoma Grade: 1 Peritoneal implants: (specify invasive or non-invasive): Negative Pelvic extension (list additional structures on separate lines and if involved): Negative Lymph nodes: number examined 20 ;  number positive 0 TNM code: pT1c2, pN0, pMx FIGO Stage (based on pathologic findings, needs clinical correlation): IC2   09/11/2017 Surgery   Preoperative Diagnosis: 1. left adnexal mass.   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    Procedure(s) Performed: 1. Exploratory laparotomy with bilateral salpingo-oophorectomy, pelvic and para-aortic lymph node dissection, omentectomy and radical debulking for ovarian cancer (CPT [phone])   Surgeon: Everitt Amber, M.D.  Operative Findings:20 cm left ovarian mass.   No intraperitoneal rupture occurred;  Frozen pathology was consistent with adenocarcinoma; right tube and ovary normal in appearance; 3) normal bilateral pelvic and para-aortic lymph nodes and omentum; small and large bowel to palpation. This represented an optimal cytoreduction with no gross visible disease remaining.    10/09/2017 Tumor Marker   Patient's tumor was tested for the following markers: CA-125 Results of the tumor marker test revealed 68.7   10/15/2017 Adverse Reaction   She developed reaction to Paclitaxel.   10/15/2017 - 01/29/2018 Chemotherapy   She received carboplatin. Due to infusion reaction to Taxol with cycle 1, treatment was switched to carboplatin and Abraxane   11/03/2017 Procedure   Successful placement of a right internal jugular approach power injectable Port-A-Cath. The catheter is ready for immediate use.   11/27/2017 Tumor Marker   Patient's tumor was tested for the following markers: CA-125 Results of the tumor marker test revealed 27.7   12/12/2017 Genetic Testing   The patient had genetic testing due to a personal  history of ovarian cancer and a family history of pancreatic cancer (and possibly breast cancer).  The [facility] Hereditary Cancer Panel + tumor HRD analysis was ordered from the laboratory [facility]. The [facility] gene panel offered by [employer] includes sequencing and deletion/duplication testing of the following 29 genes: APC, ATM, BARD1, BMPR1A, BRCA1, BRCA2, BRIP1, CHD1, CDK4, CDKN2A, CHEK2, EPCAM (large rearrangement only), HOXB13, MLH1, MSH2, MSH6, MUTYH, NBN, PALB2, PMS2, PTEN, RAD51C, RAD51D, SMAD4, STK11, and TP53. Sequencing was performed for select regions of POLE and POLD1, and large rearrangement analysis was performed for select regions of GREM1.  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 [id number] (p.[id number]).  The date of this test report is 12/12/2017.   01/08/2018 Tumor Marker   Patient's tumor was tested for the following markers: CA-125 Results of the tumor marker test revealed 17.5   03/02/2018 Tumor Marker   Patient's tumor was tested for the following markers: CA-125 Results of the tumor marker test revealed 11.4   03/14/2018 Imaging   MRCP 1. No worrisome pancreatic lesions are currently present. 2. Cholelithiasis. 3. Small benign-appearing bilateral renal cysts. 4. Midline anterior abdominal wall laxity seen on coronal images, potential small hernia in this vicinity without observed complication   02/08/3556 Procedure   Successful right IJ vein Port-A-Cath explant.   12/21/2019 Tumor Marker   Patient's tumor was tested for the following markers: CA-125 Results of the tumor marker test revealed 9.5.   06/19/2020 Tumor Marker   Patient's tumor was tested for the following markers: CA-125 Results of the tumor marker test revealed 12.5   09/01/2020 Imaging   The BMD measured at Femur Neck Left is 0.866 g/cm2 with a T-score of -1.2. This patient is considered osteopenic according to [facility]) criteria    12/19/2020 Tumor Marker   Patient's tumor was tested for the following markers: CA-125. Results of the tumor marker test revealed 10.6   07/04/2021 Imaging   1. No evidence of metastatic disease  in the abdomen or pelvis. 2. Signs of hysterectomy and pelvic and abdominal lymphadenectomy. 3. Stable 9 mm low-attenuation focus along the margin of the RIGHT hepatic lobe, unchanged since September of 2018. Likely benign process such as atypical hemangioma. 4. Cholelithiasis 5. Colonic diverticulosis and diverticular disease mainly in the descending and proximal sigmoid colon. 6. Aortic atherosclerosis.   Aortic Atherosclerosis (ICD10-I70.0).     07/02/2022 Imaging   Stable exam. No evidence of malignancy or other acute findings within the abdomen or pelvis. Colonic diverticulosis, without radiographic evidence of diverticulitis.   Aortic Atherosclerosis (ICD10-I70.0).     07/03/2022 Tumor Marker   Patient's tumor was tested for the following markers: CA-125. Results of the tumor marker test revealed 10.7.     Family Hx:  Family History  Problem Relation Age of Onset   Hypertension Mother    Hyperlipidemia Mother    Thyroid disease Mother    Pancreatic cancer Father 66       died 3 months after dx   Hypertension Father        questionable   Prostate cancer Father 76       had surgery for it   Cancer Father    Depression Father    Sleep apnea Father    Obesity Father    Breast cancer Maternal Aunt        dx 61's- she had surgery, think it was cancer but could have been just a lump removed   Heart disease Maternal Aunt    Lung cancer Maternal Aunt        primary   Heart attack Maternal Grandmother    Diabetes Maternal Grandfather    Dementia Maternal Grandfather    Osteoporosis Paternal Grandmother    Colon cancer Paternal Grandfather 5   Stomach cancer Neg Hx    Esophageal cancer Neg Hx     Vitals:  Blood pressure (!) 144/86, pulse 83, temperature 98.5 F (36.9 C),  temperature source Oral, resp. rate 14, height _0  (1.626 m), weight 209 lb (94.8 kg), SpO2 96 %.  CBC    Component Value Date/Time   WBC 6.6 07/02/2022 0839   RBC 4.88 07/02/2022 0839   HGB 14.0 07/02/2022 0839   HGB 10.1 (L) 11/27/2017 0818   HCT 42.2 07/02/2022 0839   HCT 30.2 (L) 11/27/2017 0818   PLT 361 07/02/2022 0839   PLT 294 11/27/2017 0818   MCV 86.5 07/02/2022 0839   MCV 83.2 11/27/2017 0818   MCH 28.7 07/02/2022 0839   MCHC 33.2 07/02/2022 0839   RDW 13.7 07/02/2022 0839   RDW 22.5 (H) 11/27/2017 0818   LYMPHSABS 2.0 07/02/2022 0839   LYMPHSABS 1.2 11/27/2017 0818   MONOABS 0.6 07/02/2022 0839   MONOABS 0.5 11/27/2017 0818   EOSABS 0.3 07/02/2022 0839   EOSABS 0.1 11/27/2017 0818   BASOSABS 0.1 07/02/2022 0839   BASOSABS 0.1 11/27/2017 0818       Latest Ref Rng & Units 07/02/2022    8:39 AM 03/27/2022    9:28 AM 02/05/2022    9:55 AM  CMP  Glucose 70 - 99 mg/dL 125  97  108   BUN 6 - 20 mg/dL _1 Creatinine 0.44 - 1.00 mg/dL 1.01  0.90  0.83   Sodium 135 - 145 mmol/L 140  143  140   Potassium 3.5 - 5.1 mmol/L 4.2  4.7  4.2   Chloride 98 - 111 mmol/L 105  102  104  CO2 22 - 32 mmol/L _0 Calcium 8.9 - 10.3 mg/dL 9.8  10.5  10.4   Total Protein 6.5 - 8.1 g/dL 7.3  7.4  7.6   Total Bilirubin 0.3 - 1.2 mg/dL 0.5  0.5  0.5   Alkaline Phos 38 - 126 U/L 72  82  68   AST 15 - 41 U/L _1 ALT 0 - 44 U/L _2 Physical Exam: General: Well developed, well nourished female in no acute distress. Alert and oriented x 3.  Neck: Supple without any enlargements.  Lymph node survey: No cervical, supraclavicular, or inguinal adenopathy.  Cardiovascular: Regular rate and rhythm. S1 and S2 normal.  Lungs: Clear to auscultation bilaterally. No wheezes/crackles/rhonchi noted.  Skin: No rashes or lesions present. Back: No CVA tenderness.  Abdomen: Abdomen soft, non-tender and obese. Active bowel sounds in all quadrants. No evidence of  a fluid wave or abdominal masses. Incisions well healed. No obvious evidence of hernia at the healed midline incision with increased abdominal pressure but there is a slight soft bulge when abdomen is relaxed.   Genitourinary:    Vulva/vagina: Normal external female genitalia. No lesions.    Urethra: No lesions or masses.    Vagina: Mildly atrophic without any lesions. No palpable masses. No vaginal bleeding or drainage noted.  Rectal: Good tone, no masses, no cul de sac nodularity.  Extremities: No bilateral cyanosis, edema, or clubbing.    Dorothyann Gibbs, MD 08/06/2022, 12:58 PM

## 2022-08-06 NOTE — Patient Instructions (Addendum)
Your exam today is normal. Plan to have a CA 125 at your upcoming appointment in November with Dr. Alvy Bimler Your most recent CA 125 was normal at 10.7 and stable with previous values.   You can let the office know when you are ready to follow up with Dr. Ardis Hughs with Gastroenterology and we can place a referral at that time.   Plan to follow up in six months or sooner if needed. Please call the office at [phone] in late Fall 2023 to schedule an appt in Feb 2023. Please call the office for any questions, concerns, or new symptoms. We will try to space out the visits with our office and Dr. Alvy Bimler so you are not having to come back too frequently.    Symptoms to report to your health care team include vaginal bleeding, rectal bleeding, bloating, weight loss without effort, new and persistent pain, new and  persistent fatigue, new leg swelling, new masses (i.e., bumps in your neck or groin), new and persistent cough, new and persistent nausea and vomiting, change in bowel or bladder habits, and any other concerns.

## 2022-09-05 ENCOUNTER — Ambulatory Visit (INDEPENDENT_AMBULATORY_CARE_PROVIDER_SITE_OTHER): Payer: 59 | Admitting: Adult Health

## 2022-09-20 ENCOUNTER — Telehealth: Payer: Self-pay

## 2022-09-20 NOTE — Telephone Encounter (Signed)
Called her to give November appts and she would like to delay appt until 1/24 if possible. Sent Dr. Alvy Bimler a message.

## 2022-09-24 ENCOUNTER — Telehealth: Payer: Self-pay

## 2022-09-24 NOTE — Telephone Encounter (Signed)
Called and left a message asking her to call the office back. Instructed her to call radiology scheduling and reschedule MRI in January as she requested. Appt canceled for MD and lab in November.

## 2022-10-17 NOTE — Progress Notes (Addendum)
TeleHealth Visit:  This visit was completed with telemedicine (audio/video) technology. Sierra Gray has verbally consented to this TeleHealth visit. The patient is located at home, the provider is located at home. The participants in this visit include the listed provider and patient. The visit was conducted today via MyChart video.  OBESITY Sierra Gray is here to discuss her progress with her obesity treatment plan along with follow-up of her obesity related diagnoses.   Today's visit was # 31 Starting weight: 212 lbs Starting date: 07/11/2020 Weight at last in office visit: 203 lbs on 08/01/22 Total weight loss: 9 lbs at last in office visit on 08/01/22. Today's reported weight: 211 lbs   Nutrition Plan: practicing portion control and making smarter food choices, such as increasing vegetables and decreasing simple carbohydrates.   Current exercise: walking 30 minutes 2 times per week.  Interim History: Has been back-and-forth to [place] quite a bit since May because her 56 year old mother had a stroke.  Her mom can walk but has no sensation in her left hand although she can move it. Sierra Gray typically only has 2 meals per day.  She skips breakfast.  Lunch is generally a [place] yogurt- yesterday she had a [place] yogurt and a pack of nabs.  She generally cooks dinner.  Drinks unsweet tea.   Does have some sweets cravings and reports she has 100-calorie pack of something sweet when she is "good". She is walking less recently.  She would like less frequent visits because visits cost her $100.  Assessment/Plan:  1.  Mixed hyperlipidemia Lipid profile from 08/01/2022-LDL at goal (84), triglycerides normal at 132, HDL normal at 53. Medication(s): Rest or 10 mg daily.  Denies myalgias. Cardiovascular risk factors: dyslipidemia, hypertension, obesity (BMI >= 30 kg/m2), and sedentary lifestyle  Lab Results  Component Value Date   CHOL 160 08/01/2022   HDL 53 08/01/2022   LDLCALC 84 08/01/2022    TRIG 132 08/01/2022   CHOLHDL 3.0 08/01/2022   Lab Results  Component Value Date   ALT 21 07/02/2022   AST 17 07/02/2022   ALKPHOS 72 07/02/2022   BILITOT 0.5 07/02/2022   The 10-year ASCVD risk score (Arnett DK, et al., 2019) is: 4.8%   Values used to calculate the score:     Age: 61 years     Sex: Female     Is Non-Hispanic African American: No     Diabetic: No     Tobacco smoker: No     Systolic Blood Pressure: 195 mmHg     Is BP treated: Yes     HDL Cholesterol: 53 mg/dL     Total Cholesterol: 160 mg/dL  Plan: Refill Crestor 10 mg daily  2. Hypertension Hypertension borderline controlled.  Medication(s): Amlodipine 2.5 mg daily.   BP Readings from Last 3 Encounters:  08/06/22 (!) 144/86  08/01/22 116/79  07/04/22 (!) 144/90   Lab Results  Component Value Date   CREATININE 1.01 (H) 07/02/2022   CREATININE 0.90 03/27/2022   CREATININE 0.83 02/05/2022    Plan: Refill amlodipine 2.5 mg daily. Consider increasing dose to 5 mg if BP elevated next in office visit.   3. Obesity: Current BMI 34.9 Sierra Gray is currently in the action stage of change. As such, her goal is to continue with weight loss efforts.  She has agreed to practicing portion control and making smarter food choices, such as increasing vegetables and decreasing simple carbohydrates.   Encouraged her to track protein and aim for 80 g/day. Encouraged use of  Lose It! app.  Exercise goals: Increase frequency of walking.  Behavioral modification strategies: increasing lean protein intake, decreasing simple carbohydrates, and planning for success.  Sierra Gray has agreed to follow-up with our clinic in 5 weeks.   No orders of the defined types were placed in this encounter.   Medications Discontinued During This Encounter  Medication Reason   rosuvastatin (CRESTOR) 10 MG tablet Reorder   amLODipine (NORVASC) 2.5 MG tablet Reorder     Meds ordered this encounter  Medications   amLODipine (NORVASC)  2.5 MG tablet    Sig: Take 1 tablet (2.5 mg total) by mouth daily.    Dispense:  90 tablet    Refill:  0    Order Specific Question:   Supervising Provider    Answer:   Dell Ponto [[id number]]   rosuvastatin (CRESTOR) 10 MG tablet    Sig: Take 1 tablet (10 mg total) by mouth daily.    Dispense:  90 tablet    Refill:  0    Order Specific Question:   Supervising Provider    Answer:   Dell Ponto [[id number]]      Objective:   VITALS: Per patient if applicable, see vitals. GENERAL: Alert and in no acute distress. CARDIOPULMONARY: No increased WOB. Speaking in clear sentences.  PSYCH: Pleasant and cooperative. Speech normal rate and rhythm. Affect is appropriate. Insight and judgement are appropriate. Attention is focused, linear, and appropriate.  NEURO: Oriented as arrived to appointment on time with no prompting.   Lab Results  Component Value Date   CREATININE 1.01 (H) 07/02/2022   BUN 14 07/02/2022   NA 140 07/02/2022   K 4.2 07/02/2022   CL 105 07/02/2022   CO2 26 07/02/2022   Lab Results  Component Value Date   ALT 21 07/02/2022   AST 17 07/02/2022   ALKPHOS 72 07/02/2022   BILITOT 0.5 07/02/2022   Lab Results  Component Value Date   HGBA1C 5.7 (H) 08/01/2022   HGBA1C 5.6 03/27/2022   HGBA1C 5.7 (H) 12/17/2021   HGBA1C 5.5 09/11/2021   HGBA1C 5.8 (H) 04/11/2021   Lab Results  Component Value Date   INSULIN 46.3 (H) 08/01/2022   INSULIN 18.6 03/27/2022   INSULIN 23.1 12/17/2021   INSULIN 27.4 (H) 09/11/2021   INSULIN 16.7 04/11/2021   Lab Results  Component Value Date   TSH 0.285 (L) 07/11/2020   Lab Results  Component Value Date   CHOL 160 08/01/2022   HDL 53 08/01/2022   LDLCALC 84 08/01/2022   TRIG 132 08/01/2022   CHOLHDL 3.0 08/01/2022   Lab Results  Component Value Date   WBC 6.6 07/02/2022   HGB 14.0 07/02/2022   HCT 42.2 07/02/2022   MCV 86.5 07/02/2022   PLT 361 07/02/2022   No results found for: "IRON", "TIBC", "FERRITIN" Lab Results   Component Value Date   VD25OH 74.8 08/01/2022   VD25OH 68.1 03/27/2022   VD25OH 54.7 12/17/2021    Attestation Statements:   Reviewed by clinician on day of visit: allergies, medications, problem list, medical history, surgical history, family history, social history, and previous encounter notes.

## 2022-10-21 ENCOUNTER — Telehealth (INDEPENDENT_AMBULATORY_CARE_PROVIDER_SITE_OTHER): Payer: 59 | Admitting: Family Medicine

## 2022-10-21 ENCOUNTER — Encounter (INDEPENDENT_AMBULATORY_CARE_PROVIDER_SITE_OTHER): Payer: Self-pay | Admitting: Family Medicine

## 2022-10-21 DIAGNOSIS — E669 Obesity, unspecified: Secondary | ICD-10-CM

## 2022-10-21 DIAGNOSIS — E782 Mixed hyperlipidemia: Secondary | ICD-10-CM

## 2022-10-21 DIAGNOSIS — I10 Essential (primary) hypertension: Secondary | ICD-10-CM

## 2022-10-21 DIAGNOSIS — Z6834 Body mass index (BMI) 34.0-34.9, adult: Secondary | ICD-10-CM

## 2022-10-21 MED ORDER — ROSUVASTATIN CALCIUM 10 MG PO TABS: 10.0000 mg | ORAL_TABLET | Freq: Every day | ORAL | 0 refills | Status: DC

## 2022-10-21 MED ORDER — AMLODIPINE BESYLATE 2.5 MG PO TABS: 2.5000 mg | ORAL_TABLET | Freq: Every day | ORAL | 0 refills | Status: DC

## 2022-10-22 ENCOUNTER — Other Ambulatory Visit: Payer: 59

## 2022-10-23 ENCOUNTER — Other Ambulatory Visit (HOSPITAL_COMMUNITY): Payer: 59

## 2022-10-23 ENCOUNTER — Other Ambulatory Visit: Payer: 59

## 2022-10-25 ENCOUNTER — Ambulatory Visit: Payer: 59 | Admitting: Hematology and Oncology

## 2022-11-11 ENCOUNTER — Telehealth: Payer: Self-pay

## 2022-11-11 NOTE — Telephone Encounter (Signed)
Called and scheduled appts in January 2024. She is aware of appts.

## 2022-11-19 ENCOUNTER — Telehealth (INDEPENDENT_AMBULATORY_CARE_PROVIDER_SITE_OTHER): Payer: 59 | Admitting: Family Medicine

## 2022-12-25 ENCOUNTER — Other Ambulatory Visit: Payer: Self-pay

## 2022-12-25 ENCOUNTER — Inpatient Hospital Stay: Payer: 59 | Attending: Hematology and Oncology

## 2022-12-25 ENCOUNTER — Ambulatory Visit (HOSPITAL_COMMUNITY)
Admission: RE | Admit: 2022-12-25 | Discharge: 2022-12-25 | Disposition: A | Discharge status: Home / Self Care | Payer: 59 | Source: Ambulatory Visit | Attending: Hematology and Oncology | Admitting: Hematology and Oncology

## 2022-12-25 DIAGNOSIS — Z1239 Encounter for other screening for malignant neoplasm of breast: Secondary | ICD-10-CM | POA: Insufficient documentation

## 2022-12-25 DIAGNOSIS — Z1502 Genetic susceptibility to malignant neoplasm of ovary: Secondary | ICD-10-CM | POA: Insufficient documentation

## 2022-12-25 DIAGNOSIS — Z8543 Personal history of malignant neoplasm of ovary: Secondary | ICD-10-CM | POA: Diagnosis not present

## 2022-12-25 DIAGNOSIS — Z9221 Personal history of antineoplastic chemotherapy: Secondary | ICD-10-CM | POA: Diagnosis not present

## 2022-12-25 DIAGNOSIS — Z1589 Genetic susceptibility to other disease: Secondary | ICD-10-CM | POA: Insufficient documentation

## 2022-12-25 DIAGNOSIS — Z1509 Genetic susceptibility to other malignant neoplasm: Secondary | ICD-10-CM | POA: Insufficient documentation

## 2022-12-25 DIAGNOSIS — N951 Menopausal and female climacteric states: Secondary | ICD-10-CM | POA: Insufficient documentation

## 2022-12-25 DIAGNOSIS — Z79899 Other long term (current) drug therapy: Secondary | ICD-10-CM | POA: Insufficient documentation

## 2022-12-25 DIAGNOSIS — D241 Benign neoplasm of right breast: Secondary | ICD-10-CM | POA: Diagnosis not present

## 2022-12-25 DIAGNOSIS — Z1501 Genetic susceptibility to malignant neoplasm of breast: Secondary | ICD-10-CM | POA: Diagnosis present

## 2022-12-25 DIAGNOSIS — D242 Benign neoplasm of left breast: Secondary | ICD-10-CM | POA: Diagnosis not present

## 2022-12-25 DIAGNOSIS — R92323 Mammographic fibroglandular density, bilateral breasts: Secondary | ICD-10-CM | POA: Diagnosis not present

## 2022-12-25 DIAGNOSIS — C562 Malignant neoplasm of left ovary: Secondary | ICD-10-CM

## 2022-12-25 LAB — COMPREHENSIVE METABOLIC PANEL
ALT: 22 U/L (ref 0–44)
AST: 18 U/L (ref 15–41)
Albumin: 4.2 g/dL (ref 3.5–5.0)
Alkaline Phosphatase: 68 U/L (ref 38–126)
Anion gap: 7 (ref 5–15)
BUN: 15 mg/dL (ref 6–20)
CO2: 26 mmol/L (ref 22–32)
Calcium: 10 mg/dL (ref 8.9–10.3)
Chloride: 107 mmol/L (ref 98–111)
Creatinine, Ser: 0.9 mg/dL (ref 0.44–1.00)
GFR, Estimated: >60 mL/min (ref >60)
Glucose, Bld: 108 mg/dL — ABNORMAL HIGH (ref 70–99)
Potassium: 4.3 mmol/L (ref 3.5–5.1)
Sodium: 140 mmol/L (ref 135–145)
Total Bilirubin: 0.5 mg/dL (ref 0.3–1.2)
Total Protein: 6.9 g/dL (ref 6.5–8.1)

## 2022-12-25 LAB — CBC WITH DIFFERENTIAL/PLATELET
Abs Immature Granulocytes: 0.02 10*3/uL (ref 0.00–0.07)
Basophils Absolute: 0.1 10*3/uL (ref 0.0–0.1)
Basophils Relative: 1 %
Eosinophils Absolute: 0.3 10*3/uL (ref 0.0–0.5)
Eosinophils Relative: 5 %
HCT: 41.8 % (ref 36.0–46.0)
Hemoglobin: 14 g/dL (ref 12.0–15.0)
Immature Granulocytes: 0 %
Lymphocytes Relative: 32 %
Lymphs Abs: 2 10*3/uL (ref 0.7–4.0)
MCH: 28.6 pg (ref 26.0–34.0)
MCHC: 33.5 g/dL (ref 30.0–36.0)
MCV: 85.3 fL (ref 80.0–100.0)
Monocytes Absolute: 0.6 10*3/uL (ref 0.1–1.0)
Monocytes Relative: 9 %
Neutro Abs: 3.2 10*3/uL (ref 1.7–7.7)
Neutrophils Relative %: 53 %
Platelets: 365 10*3/uL (ref 150–400)
RBC: 4.9 MIL/uL (ref 3.87–5.11)
RDW: 13.6 % (ref 11.5–15.5)
WBC: 6.1 10*3/uL (ref 4.0–10.5)
nRBC: 0 % (ref 0.0–0.2)

## 2022-12-25 MED ORDER — GADOBUTROL 1 MMOL/ML IV SOLN
10.0000 mL | Freq: Once | INTRAVENOUS | Status: AC | PRN
  Administered 2022-12-25: 10 mL via INTRAVENOUS

## 2022-12-26 LAB — CA 125: Cancer Antigen (CA) 125: 9.5 U/mL (ref 0.0–38.1)

## 2022-12-27 ENCOUNTER — Encounter: Payer: Self-pay | Admitting: Hematology and Oncology

## 2022-12-27 ENCOUNTER — Other Ambulatory Visit: Payer: Self-pay

## 2022-12-27 ENCOUNTER — Telehealth: Payer: Self-pay

## 2022-12-27 ENCOUNTER — Inpatient Hospital Stay: Payer: 59 | Admitting: Hematology and Oncology

## 2022-12-27 VITALS — BP 145/91 | HR 106 | Temp 98.2°F | Resp 18 | Ht 64.0 in | Wt 215.2 lb

## 2022-12-27 DIAGNOSIS — M8588 Other specified disorders of bone density and structure, other site: Secondary | ICD-10-CM | POA: Diagnosis not present

## 2022-12-27 DIAGNOSIS — Z1589 Genetic susceptibility to other disease: Secondary | ICD-10-CM | POA: Diagnosis not present

## 2022-12-27 DIAGNOSIS — N951 Menopausal and female climacteric states: Secondary | ICD-10-CM | POA: Diagnosis not present

## 2022-12-27 DIAGNOSIS — Z1501 Genetic susceptibility to malignant neoplasm of breast: Secondary | ICD-10-CM

## 2022-12-27 DIAGNOSIS — R232 Flushing: Secondary | ICD-10-CM | POA: Diagnosis not present

## 2022-12-27 DIAGNOSIS — Z1502 Genetic susceptibility to malignant neoplasm of ovary: Secondary | ICD-10-CM | POA: Diagnosis not present

## 2022-12-27 DIAGNOSIS — Z1509 Genetic susceptibility to other malignant neoplasm: Secondary | ICD-10-CM

## 2022-12-27 DIAGNOSIS — Z800 Family history of malignant neoplasm of digestive organs: Secondary | ICD-10-CM

## 2022-12-27 DIAGNOSIS — C562 Malignant neoplasm of left ovary: Secondary | ICD-10-CM

## 2022-12-27 DIAGNOSIS — Z79899 Other long term (current) drug therapy: Secondary | ICD-10-CM | POA: Diagnosis not present

## 2022-12-27 NOTE — Progress Notes (Signed)
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[place]) Her last CT imaging show no evidence of disease Tumor marker is normal and she is not symptomatic Continue surveillance follow-up every few months I plan to order CT imaging in July to assess  Monoallelic mutation of ATM gene The patient remained high risk for pancreatic cancer and breast cancer MRI of the breast is pending but by my independent review, there is nothing new She will continue mammograms alternate with MRI every 6 months, her next breast imaging would be in July 2023 with screening mammogram Her last CT imaging of the abdomen was in July 2022, normal Observe closely I plan CT imaging once a year, due next in July 2023  Osteopenia She is overdue for bone density scan She will continue calcium and vitamin D and I will order bone density scan to review  Hot flashes We discussed medical management for hot flashes including low-dose antidepressants and non-pharmacological options such as moderate intensity exercise The patient decline medical intervention right now  Orders Placed This Encounter  Procedures   CT ABDOMEN PELVIS W CONTRAST    Standing Status:   Future    Standing Expiration Date:   12/28/2023    Order Specific Question:   If indicated for the ordered procedure, I authorize the administration of contrast media per Radiology protocol    Answer:   Yes    Order Specific Question:   Preferred imaging location?    Answer:   [facility]    Order Specific Question:   Radiology Contrast Protocol - do NOT remove file path    Answer:   [email or web address]    Order Specific Question:   Is patient pregnant?    Answer:   No   DG BONE DENSITY (DXA)    Standing Status:   Future    Standing Expiration Date:   12/28/2023    Order Specific  Question:   Reason for Exam (SYMPTOM  OR DIAGNOSIS REQUIRED)    Answer:   osteopenia    Order Specific Question:   Is the patient pregnant?    Answer:   No    Order Specific Question:   Preferred imaging location?    Answer:   [facility]    All questions were answered. The patient knows to call the clinic with any problems, questions or concerns. The total time spent in the appointment was 30 minutes encounter with patients including review of chart and various tests results, discussions about plan of care and coordination of care plan   Heath Lark, MD 12/27/2022 8:45 AM  INTERVAL HISTORY: Please see below for problem oriented charting. she returns for surveillance follow-up She denies abdominal pain or changes in bowel habits She continues to have intermittent hot flashes No abnormal breast lesions  REVIEW OF SYSTEMS:   Constitutional: Denies fevers, chills or abnormal weight loss Eyes: Denies blurriness of vision Ears, nose, mouth, throat, and face: Denies mucositis or sore throat Respiratory: Denies cough, dyspnea or wheezes Cardiovascular: Denies palpitation, chest discomfort or lower extremity swelling Gastrointestinal:  Denies nausea, heartburn or change in bowel habits Skin: Denies abnormal skin rashes Lymphatics: Denies new lymphadenopathy or easy bruising Neurological:Denies numbness, tingling or new weaknesses Behavioral/Psych: Mood is stable, no new changes  All other systems were reviewed with the patient and are negative.  I have reviewed the past medical history,  past surgical history, social history and family history with the patient and they are unchanged from previous note.  ALLERGIES:  is allergic to bee venom, chlorhexidine, paclitaxel, bacitracin, iodine, and other.  MEDICATIONS:  Current Outpatient Medications  Medication Sig Dispense Refill   amLODipine (NORVASC) 2.5 MG tablet Take 1 tablet (2.5 mg total) by mouth daily. 90 tablet 0   anastrozole  (ARIMIDEX) 1 MG tablet TAKE 1 TABLET BY MOUTH EVERY DAY 90 tablet 8   Apple Cider Vinegar 600 MG CAPS Take 1,200 mg by mouth 2 (two) times daily. Takes in morning and in evening.     Ascorbic Acid (VITAMIN C) 1000 MG tablet Take 1,000 mg by mouth daily.     cetirizine (ZYRTEC) 10 MG tablet Take 10 mg by mouth daily.     Cholecalciferol (VITAMIN D3) 125 MCG (5000 UT) CAPS Take 3,000 Units by mouth daily.      Cinnamon 500 MG TABS Take 2,000 mg by mouth 2 (two) times daily.     ibuprofen (ADVIL,MOTRIN) 200 MG tablet Take 400 mg by mouth every 6 (six) hours as needed.     Inulin (FIBER CHOICE PO) Take 10 g by mouth daily.     Liniments (SALONPAS PAIN RELIEF PATCH EX) Place 1 patch onto the skin daily as needed (knee pain).     magnesium 30 MG tablet Take 30 mg by mouth 2 (two) times daily.     Melatonin 5 MG TABS Take 5 mg by mouth daily.      Probiotic Product (PROBIOTIC DAILY PO) Take by mouth.     rosuvastatin (CRESTOR) 10 MG tablet Take 1 tablet (10 mg total) by mouth daily. 90 tablet 0   Turmeric 500 MG TABS Take 500 mg by mouth daily.     Vitamin E 100 units TABS Take by mouth.     No current facility-administered medications for this visit.    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 Soft tissue, biopsy, right medial thigh MATURE LIPOMA 3. Ovary and fallopian tube, right ENDOMETRIOMA AND SIMPLE SEROUS  CYST WITH FALLOPIAN TUBE ADHESION NEGATIVE FOR MALIGNANCY 4. Omentum, resection for tumor BENIGN OMENTUM NEGATIVE FOR CARCINOMA 5. Lymph nodes, regional resection, right pelvic EIGHT BENIGN LYMPH NODES (0/8) 6. Lymph nodes, regional resection, left pelvic SEVEN BENIGN LYMPH NODES (0/7) 7. Lymph node, biopsy, right para-aortic FOUR BENIGN LYMPH NODES (0/4) 8. Lymph node, biopsy, left para-aortic ONE BENIGN LYMPH NODE (0/1) 9. Peritoneum, biopsy, left abdominal BENIGN FIBROMUSCULAR TISSUE 10. Peritoneum, biopsy, right abdominal BENIGN FIBROMUSCULAR TISSUE WITH SEROSITIS 11. Peritoneum, biopsy, pelvic FIBROADIPOSE TISSUE WITH SEROSITIS  Specimen(s): Ovary and fallopian tube Procedure: (including lymph node sampling): salpingo-oophorectomy Primary tumor site (including laterality): Left ovary Ovarian surface involvement: Yes Ovarian capsule intact without fragmentation: intact Maximum tumor size (cm): 20.0 cm Histologic type: Endometrioid carcinoma Grade: 1 Peritoneal implants: (specify invasive or non-invasive): Negative Pelvic extension (list additional structures on separate lines and if  involved): Negative Lymph nodes: number examined 20 ; number positive 0 TNM code: pT1c2, pN0, pMx FIGO Stage (based on pathologic findings, needs clinical correlation): IC2   09/11/2017 Surgery   Preoperative Diagnosis: 1. left adnexal mass.   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    Procedure(s) Performed: 1. Exploratory laparotomy with bilateral salpingo-oophorectomy, pelvic and para-aortic lymph node dissection, omentectomy and radical debulking for ovarian cancer (CPT [phone])   Surgeon: Everitt Amber, M.D.  Operative Findings:20 cm left ovarian mass.   No intraperitoneal rupture occurred;  Frozen pathology was consistent with adenocarcinoma; right tube and ovary normal in appearance; 3) normal bilateral pelvic and para-aortic lymph nodes and  omentum; small and large bowel to palpation. This represented an optimal cytoreduction with no gross visible disease remaining.    10/09/2017 Tumor Marker   Patient's tumor was tested for the following markers: CA-125 Results of the tumor marker test revealed 68.7   10/15/2017 Adverse Reaction   She developed reaction to Paclitaxel.   10/15/2017 - 01/29/2018 Chemotherapy   She received carboplatin. Due to infusion reaction to Taxol with cycle 1, treatment was switched to carboplatin and Abraxane   11/03/2017 Procedure   Successful placement of a right internal jugular approach power injectable Port-A-Cath. The catheter is ready for immediate use.   11/27/2017 Tumor Marker   Patient's tumor was tested for the following markers: CA-125 Results of the tumor marker test revealed 27.7   12/12/2017 Genetic Testing   The patient had genetic testing due to a personal history of ovarian cancer and a family history of pancreatic cancer (and possibly breast cancer).  The [facility] Hereditary Cancer Panel + tumor HRD analysis was ordered from the laboratory [facility]. The [facility] gene panel offered by [employer] includes sequencing and deletion/duplication testing of the following 29 genes: APC, ATM, BARD1, BMPR1A, BRCA1, BRCA2, BRIP1, CHD1, CDK4, CDKN2A, CHEK2, EPCAM (large rearrangement only), HOXB13, MLH1, MSH2, MSH6, MUTYH, NBN, PALB2, PMS2, PTEN, RAD51C, RAD51D, SMAD4, STK11, and TP53. Sequencing was performed for select regions of POLE and POLD1, and large rearrangement analysis was performed for select regions of GREM1.  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 [id number] (p.[id number]).  The date of this test report is 12/12/2017.   01/08/2018 Tumor Marker   Patient's tumor was tested for the following markers: CA-125 Results of the tumor marker test revealed 17.5   03/02/2018 Tumor Marker   Patient's tumor was tested for the following markers: CA-125 Results  of the tumor marker test revealed 11.4   03/14/2018 Imaging   MRCP 1. No worrisome pancreatic lesions are currently present. 2. Cholelithiasis. 3. Small benign-appearing bilateral renal cysts. 4. Midline anterior abdominal wall laxity seen on coronal images, potential small hernia in this vicinity without observed complication   07/17/3733 Procedure   Successful right IJ vein Port-A-Cath explant.   12/21/2019 Tumor Marker   Patient's tumor was tested for the following markers: CA-125 Results of the tumor marker test revealed 9.5.   06/19/2020 Tumor Marker   Patient's tumor was tested for the following markers: CA-125 Results of the tumor marker test revealed 12.5   09/01/2020 Imaging   The BMD measured at Femur Neck Left is 0.866 g/cm2 with a T-score of -1.2. This patient is considered osteopenic according to [facility]) criteria   12/19/2020 Tumor Marker   Patient's tumor was tested for the following markers: CA-125. Results of the tumor marker test revealed 10.6   07/04/2021 Imaging  1. No evidence of metastatic disease in the abdomen or pelvis. 2. Signs of hysterectomy and pelvic and abdominal lymphadenectomy. 3. Stable 9 mm low-attenuation focus along the margin of the RIGHT hepatic lobe, unchanged since September of 2018. Likely benign process such as atypical hemangioma. 4. Cholelithiasis 5. Colonic diverticulosis and diverticular disease mainly in the descending and proximal sigmoid colon. 6. Aortic atherosclerosis.   Aortic Atherosclerosis (ICD10-I70.0).     07/02/2022 Imaging   Stable exam. No evidence of malignancy or other acute findings within the abdomen or pelvis. Colonic diverticulosis, without radiographic evidence of diverticulitis.   Aortic Atherosclerosis (ICD10-I70.0).     07/03/2022 Tumor Marker   Patient's tumor was tested for the following markers: CA-125. Results of the tumor marker test revealed 10.7.   12/25/2022 Tumor Marker    Patient's tumor was tested for the following markers: CA-125. Results of the tumor marker test revealed 9.5.     PHYSICAL EXAMINATION: ECOG PERFORMANCE STATUS: 1 - Symptomatic but completely ambulatory  Vitals:   12/27/22 0820  BP: (!) 145/91  Pulse: (!) 106  Resp: 18  Temp: 98.2 F (36.8 C)  SpO2: 98%   Filed Weights   12/27/22 0820  Weight: 215 lb 3.2 oz (97.6 kg)    GENERAL:alert, no distress and comfortable  NEURO: alert & oriented x 3 with fluent speech, no focal motor/sensory deficits  LABORATORY DATA:  I have reviewed the data as listed    Component Value Date/Time   NA 140 12/25/2022 0811   NA 143 03/27/2022 0928   NA 141 11/27/2017 0818   K 4.3 12/25/2022 0811   K 3.8 11/27/2017 0818   CL 107 12/25/2022 0811   CO2 26 12/25/2022 0811   CO2 25 11/27/2017 0818   GLUCOSE 108 (H) 12/25/2022 0811   GLUCOSE 90 11/27/2017 0818   BUN 15 12/25/2022 0811   BUN 15 03/27/2022 0928   BUN 12.9 11/27/2017 0818   CREATININE 0.90 12/25/2022 0811   CREATININE 0.8 11/27/2017 0818   CALCIUM 10.0 12/25/2022 0811   CALCIUM 9.9 11/27/2017 0818   PROT 6.9 12/25/2022 0811   PROT 7.4 03/27/2022 0928   PROT 7.0 11/27/2017 0818   ALBUMIN 4.2 12/25/2022 0811   ALBUMIN 4.9 03/27/2022 0928   ALBUMIN 3.8 11/27/2017 0818   AST 18 12/25/2022 0811   AST 11 11/27/2017 0818   ALT 22 12/25/2022 0811   ALT 11 11/27/2017 0818   ALKPHOS 68 12/25/2022 0811   ALKPHOS 68 11/27/2017 0818   BILITOT 0.5 12/25/2022 0811   BILITOT 0.5 03/27/2022 0928   BILITOT 0.41 11/27/2017 0818   GFRNONAA >60 12/25/2022 0811   GFRAA 77 11/09/2020 1005    No results found for: "SPEP", "UPEP"  Lab Results  Component Value Date   WBC 6.1 12/25/2022   NEUTROABS 3.2 12/25/2022   HGB 14.0 12/25/2022   HCT 41.8 12/25/2022   MCV 85.3 12/25/2022   PLT 365 12/25/2022      Chemistry      Component Value Date/Time   NA 140 12/25/2022 0811   NA 143 03/27/2022 0928   NA 141 11/27/2017 0818   K 4.3  12/25/2022 0811   K 3.8 11/27/2017 0818   CL 107 12/25/2022 0811   CO2 26 12/25/2022 0811   CO2 25 11/27/2017 0818   BUN 15 12/25/2022 0811   BUN 15 03/27/2022 0928   BUN 12.9 11/27/2017 0818   CREATININE 0.90 12/25/2022 0811   CREATININE 0.8 11/27/2017 0818  Component Value Date/Time   CALCIUM 10.0 12/25/2022 0811   CALCIUM 9.9 11/27/2017 0818   ALKPHOS 68 12/25/2022 0811   ALKPHOS 68 11/27/2017 0818   AST 18 12/25/2022 0811   AST 11 11/27/2017 0818   ALT 22 12/25/2022 0811   ALT 11 11/27/2017 0818   BILITOT 0.5 12/25/2022 0811   BILITOT 0.5 03/27/2022 0928   BILITOT 0.41 11/27/2017 0818       RADIOGRAPHIC STUDIES: I have personally reviewed the radiological images as listed and agreed with the findings in the report. MR BREAST BILATERAL W WO CONTRAST INC CAD  Result Date: 12/25/2022 CLINICAL DATA:  62 year old with a personal history of ovarian cancer who is a carrier of an ATM genetic mutation which places her at higher risk for development of breast cancer. Supplemental high-risk screening MRI. EXAM: BILATERAL BREAST MRI WITH AND WITHOUT CONTRAST TECHNIQUE: Multiplanar, multisequence MR images of both breasts were obtained prior to and following the intravenous administration of 10 ml of Gadavist. Three-dimensional MR images were rendered by post-processing of the original MR data on an independent workstation. The three-dimensional MR images were interpreted, and findings are reported in the following complete MRI report for this study. Three dimensional images were evaluated at the independent interpreting workstation using the DynaCAD thin client. COMPARISON:  Breast MRI 10/22/2021 and earlier. Mammography 04/23/2022 and earlier. FINDINGS: Breast composition: b. Scattered fibroglandular tissue. Background parenchymal enhancement: Mild. RIGHT breast: No suspicious mass or abnormal enhancement. There are 4 partially enhancing masses in the breast which are unchanged from the  prior MRI and which represent calcified, degenerating fibroadenomas on the prior mammograms. The largest of these is in the [place] and measures approximately 1.3 x 1.1 cm (previously 1.3 x 1.2 cm on the 10/22/2021 MRI). LEFT breast: No suspicious mass or abnormal enhancement. Partially enhancing mass in the LOWER OUTER QUADRANT which measures approximately 0.9 x 0.6 cm is unchanged from the prior MRI and was also shown to represent a degenerating fibroadenoma on the prior mammograms. Lymph nodes: No abnormal appearing lymph nodes. Ancillary findings:  None. IMPRESSION: 1. No MRI evidence of malignancy involving either breast. 2. 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 RECOMMENDATION: 1. Annual screening mammography which is due in May, 2024. 2. Supplemental high-risk screening MRI in 1 year. BI-RADS CATEGORY  2: Benign. Electronically Signed   By: Evangeline Dakin M.D.   On: 12/25/2022 11:45

## 2022-12-27 NOTE — Assessment & Plan Note (Signed)
Her last CT imaging show no evidence of disease Tumor marker is normal and she is not symptomatic Continue surveillance follow-up every few months I plan to order CT imaging in July to assess

## 2022-12-27 NOTE — Assessment & Plan Note (Signed)
She is overdue for bone density scan She will continue calcium and vitamin D and I will order bone density scan to review

## 2022-12-27 NOTE — Assessment & Plan Note (Signed)
The patient remained high risk for pancreatic cancer and breast cancer MRI of the breast is pending but by my independent review, there is nothing new She will continue mammograms alternate with MRI every 6 months, her next breast imaging would be in July 2023 with screening mammogram Her last CT imaging of the abdomen was in July 2022, normal Observe closely I plan CT imaging once a year, due next in July 2023

## 2022-12-27 NOTE — Telephone Encounter (Signed)
Called and left a message with bone density appt on mobile unit with the breast center with the first available appt. Mobile unit address is [address] [place], [place]. Appt scheduled  2/22 at 0830, arrive at 0815. No calcium or multivitamin with calcium for 48 hours prior to exam. Ask her to call the office back for questions.

## 2022-12-27 NOTE — Assessment & Plan Note (Signed)
We discussed medical management for hot flashes including low-dose antidepressants and non-pharmacological options such as moderate intensity exercise The patient decline medical intervention right now

## 2022-12-27 NOTE — Telephone Encounter (Signed)
-----  Message from Heath Lark, MD sent at 12/27/2022  8:34 AM EST ----- Regarding: dexa scan Please schedule bone density  scan in 1-2 weeks

## 2023-01-27 ENCOUNTER — Other Ambulatory Visit (INDEPENDENT_AMBULATORY_CARE_PROVIDER_SITE_OTHER): Payer: Self-pay | Admitting: Family Medicine

## 2023-01-27 DIAGNOSIS — E782 Mixed hyperlipidemia: Secondary | ICD-10-CM

## 2023-01-27 DIAGNOSIS — I10 Essential (primary) hypertension: Secondary | ICD-10-CM

## 2023-01-30 ENCOUNTER — Ambulatory Visit
Admission: RE | Admit: 2023-01-30 | Discharge: 2023-01-30 | Disposition: A | Discharge status: Home / Self Care | Payer: 59 | Source: Ambulatory Visit | Attending: Hematology and Oncology | Admitting: Hematology and Oncology

## 2023-01-30 DIAGNOSIS — M8588 Other specified disorders of bone density and structure, other site: Secondary | ICD-10-CM

## 2023-01-30 DIAGNOSIS — C562 Malignant neoplasm of left ovary: Secondary | ICD-10-CM

## 2023-01-30 DIAGNOSIS — Z1509 Genetic susceptibility to other malignant neoplasm: Secondary | ICD-10-CM

## 2023-01-31 ENCOUNTER — Other Ambulatory Visit: Payer: Self-pay | Admitting: Hematology and Oncology

## 2023-01-31 ENCOUNTER — Telehealth: Payer: Self-pay

## 2023-01-31 NOTE — Telephone Encounter (Signed)
Called and given below message. She verbalized understanding. 

## 2023-01-31 NOTE — Telephone Encounter (Signed)
-----   Message from Heath Lark, MD sent at 01/31/2023  8:25 AM EST ----- Let her know bone desnity showed mild osteopenia, continue calcium and vitamin D supplement

## 2023-06-10 ENCOUNTER — Telehealth: Payer: Self-pay

## 2023-06-10 ENCOUNTER — Telehealth: Payer: Self-pay | Admitting: Hematology and Oncology

## 2023-06-10 NOTE — Telephone Encounter (Signed)
-----   Message from Artis Delay, MD sent at 06/10/2023  7:55 AM EDT ----- Pls help her schedule CT on 7/23

## 2023-06-10 NOTE — Telephone Encounter (Signed)
Left patient a vm regarding upcoming appointment change  

## 2023-06-10 NOTE — Telephone Encounter (Signed)
Called her regarding appt for CT. She is requesting appt for CT be scheduled at [facility] location.  She needs MD appt moved, she is out of town 6/29 thru 7/2.

## 2023-06-11 NOTE — Telephone Encounter (Signed)
Called and reviewed up coming appts, instructed npo 4 hours prior to CT. Rescheduled appts per her request, she is on vacation the week on 7/29. She is aware of appts.

## 2023-06-18 ENCOUNTER — Other Ambulatory Visit: Payer: Self-pay | Admitting: Hematology and Oncology

## 2023-06-18 ENCOUNTER — Encounter: Payer: Self-pay | Admitting: Hematology and Oncology

## 2023-06-18 DIAGNOSIS — Z1231 Encounter for screening mammogram for malignant neoplasm of breast: Secondary | ICD-10-CM

## 2023-07-01 ENCOUNTER — Other Ambulatory Visit: Payer: Self-pay

## 2023-07-01 ENCOUNTER — Inpatient Hospital Stay: Payer: 59 | Attending: Hematology and Oncology

## 2023-07-01 ENCOUNTER — Other Ambulatory Visit: Payer: 59

## 2023-07-01 ENCOUNTER — Ambulatory Visit (INDEPENDENT_AMBULATORY_CARE_PROVIDER_SITE_OTHER): Payer: 59

## 2023-07-01 DIAGNOSIS — Z1509 Genetic susceptibility to other malignant neoplasm: Secondary | ICD-10-CM

## 2023-07-01 DIAGNOSIS — Z1501 Genetic susceptibility to malignant neoplasm of breast: Secondary | ICD-10-CM | POA: Diagnosis not present

## 2023-07-01 DIAGNOSIS — C562 Malignant neoplasm of left ovary: Secondary | ICD-10-CM

## 2023-07-01 DIAGNOSIS — Z800 Family history of malignant neoplasm of digestive organs: Secondary | ICD-10-CM | POA: Diagnosis not present

## 2023-07-01 DIAGNOSIS — Z1502 Genetic susceptibility to malignant neoplasm of ovary: Secondary | ICD-10-CM | POA: Diagnosis present

## 2023-07-01 LAB — CBC WITH DIFFERENTIAL/PLATELET
Abs Immature Granulocytes: 0.02 10*3/uL (ref 0.00–0.07)
Basophils Absolute: 0.1 10*3/uL (ref 0.0–0.1)
Basophils Relative: 1 %
Eosinophils Absolute: 0.3 10*3/uL (ref 0.0–0.5)
Eosinophils Relative: 5 %
HCT: 42.1 % (ref 36.0–46.0)
Hemoglobin: 13.9 g/dL (ref 12.0–15.0)
Immature Granulocytes: 0 %
Lymphocytes Relative: 28 %
Lymphs Abs: 1.8 10*3/uL (ref 0.7–4.0)
MCH: 28.3 pg (ref 26.0–34.0)
MCHC: 33 g/dL (ref 30.0–36.0)
MCV: 85.7 fL (ref 80.0–100.0)
Monocytes Absolute: 0.5 10*3/uL (ref 0.1–1.0)
Monocytes Relative: 8 %
Neutro Abs: 3.9 10*3/uL (ref 1.7–7.7)
Neutrophils Relative %: 58 %
Platelets: 328 10*3/uL (ref 150–400)
RBC: 4.91 MIL/uL (ref 3.87–5.11)
RDW: 14 % (ref 11.5–15.5)
WBC: 6.6 10*3/uL (ref 4.0–10.5)
nRBC: 0 % (ref 0.0–0.2)

## 2023-07-01 LAB — COMPREHENSIVE METABOLIC PANEL
ALT: 19 U/L (ref 0–44)
AST: 17 U/L (ref 15–41)
Albumin: 4.5 g/dL (ref 3.5–5.0)
Alkaline Phosphatase: 66 U/L (ref 38–126)
Anion gap: 10 (ref 5–15)
BUN: 22 mg/dL (ref 8–23)
CO2: 23 mmol/L (ref 22–32)
Calcium: 10.1 mg/dL (ref 8.9–10.3)
Chloride: 107 mmol/L (ref 98–111)
Creatinine, Ser: 1 mg/dL (ref 0.44–1.00)
GFR, Estimated: >60 mL/min (ref >60)
Glucose, Bld: 104 mg/dL — ABNORMAL HIGH (ref 70–99)
Potassium: 4.2 mmol/L (ref 3.5–5.1)
Sodium: 140 mmol/L (ref 135–145)
Total Bilirubin: 0.7 mg/dL (ref 0.3–1.2)
Total Protein: 7 g/dL (ref 6.5–8.1)

## 2023-07-01 MED ORDER — IOHEXOL 300 MG/ML  SOLN
100.0000 mL | Freq: Once | INTRAMUSCULAR | Status: AC | PRN
  Administered 2023-07-01: 100 mL via INTRAVENOUS

## 2023-07-01 MED ORDER — IOHEXOL 9 MG/ML PO SOLN: 500.0000 mL | ORAL | Status: AC

## 2023-07-02 LAB — CA 125: Cancer Antigen (CA) 125: 11 U/mL (ref 0.0–38.1)

## 2023-07-04 ENCOUNTER — Ambulatory Visit: Payer: 59 | Admitting: Hematology and Oncology

## 2023-07-08 ENCOUNTER — Other Ambulatory Visit: Payer: 59

## 2023-07-11 ENCOUNTER — Ambulatory Visit: Payer: 59 | Admitting: Hematology and Oncology

## 2023-07-15 ENCOUNTER — Encounter: Payer: Self-pay | Admitting: Hematology and Oncology

## 2023-07-15 ENCOUNTER — Inpatient Hospital Stay: Payer: 59 | Attending: Hematology and Oncology | Admitting: Hematology and Oncology

## 2023-07-15 ENCOUNTER — Other Ambulatory Visit: Payer: Self-pay

## 2023-07-15 VITALS — BP 168/94 | HR 77 | Temp 97.4°F | Resp 18 | Ht 64.0 in | Wt 215.6 lb

## 2023-07-15 DIAGNOSIS — Z1589 Genetic susceptibility to other disease: Secondary | ICD-10-CM | POA: Diagnosis not present

## 2023-07-15 DIAGNOSIS — C562 Malignant neoplasm of left ovary: Secondary | ICD-10-CM

## 2023-07-15 DIAGNOSIS — Z9049 Acquired absence of other specified parts of digestive tract: Secondary | ICD-10-CM

## 2023-07-15 DIAGNOSIS — Z1501 Genetic susceptibility to malignant neoplasm of breast: Secondary | ICD-10-CM

## 2023-07-15 DIAGNOSIS — Z1509 Genetic susceptibility to other malignant neoplasm: Secondary | ICD-10-CM | POA: Diagnosis not present

## 2023-07-15 DIAGNOSIS — I10 Essential (primary) hypertension: Secondary | ICD-10-CM | POA: Diagnosis not present

## 2023-07-15 DIAGNOSIS — Z79899 Other long term (current) drug therapy: Secondary | ICD-10-CM | POA: Diagnosis not present

## 2023-07-15 DIAGNOSIS — Z1502 Genetic susceptibility to malignant neoplasm of ovary: Secondary | ICD-10-CM | POA: Diagnosis present

## 2023-07-15 MED ORDER — AMLODIPINE BESYLATE 2.5 MG PO TABS: 2.5000 mg | ORAL_TABLET | Freq: Every day | ORAL | 0 refills | Status: DC

## 2023-07-15 NOTE — Assessment & Plan Note (Signed)
She has uncontrolled hypertension and ran out of her prescription amlodipine for a while She has not seen a primary care doctor for a while I refilled her prescription of amlodipine 1 more time but advised the patient to establish care with her primary care doctor for future management of blood pressure

## 2023-07-15 NOTE — Progress Notes (Signed)
[facility] OFFICE PROGRESS NOTE  Patient Care Team: Patient, No Pcp Per as PCP - General (General Practice)  ASSESSMENT & PLAN:  Left ovarian epithelial cancer (HCC) Her last CT imaging show no evidence of disease Tumor marker is normal and she is not symptomatic Continue surveillance follow-up every few months; she will have appointment to see GYN in a few months I plan to order CT imaging once a year  Monoallelic mutation of ATM gene The patient remained high risk for pancreatic cancer and breast cancer She will continue mammograms alternate with MRI every 6 months, her next breast imaging is due this month with screening mammogram If her mammogram is normal, I will order MRI to be done in 6 months The patient is not interested for prophylactic bilateral mastectomy She will continue Arimidex for prophylaxis  S/P right colectomy Oct 2019 She had grossly abnormal colonoscopy leading to partial colectomy She has not returned to gastroenterologist for a long time We discussed importance of close monitoring and she is in agreement for referral  Essential hypertension She has uncontrolled hypertension and ran out of her prescription amlodipine for a while She has not seen a primary care doctor for a while I refilled her prescription of amlodipine 1 more time but advised the patient to establish care with her primary care doctor for future management of blood pressure  Orders Placed This Encounter  Procedures   CT ABDOMEN PELVIS W CONTRAST    Standing Status:   Future    Standing Expiration Date:   07/14/2024    Order Specific Question:   If indicated for the ordered procedure, I authorize the administration of contrast media per Radiology protocol    Answer:   Yes    Order Specific Question:   Does the patient have a contrast media/X-ray dye allergy?    Answer:   No    Order Specific Question:   Preferred imaging location?    Answer:   [facility]    Order  Specific Question:   If indicated for the ordered procedure, I authorize the administration of oral contrast media per Radiology protocol    Answer:   Yes   Ambulatory referral to Gastroenterology    Referral Priority:   Routine    Referral Type:   Consultation    Referral Reason:   Specialty Services Required    Number of Visits Requested:   1    All questions were answered. The patient knows to call the clinic with any problems, questions or concerns. The total time spent in the appointment was 40 minutes encounter with patients including review of chart and various tests results, discussions about plan of care and coordination of care plan   Artis Delay, MD 07/15/2023 10:36 AM  INTERVAL HISTORY: Please see below for problem oriented charting. she returns for surveillance follow-up The patient have history of ovarian cancer, ATM gene mutation, on chemoprophylaxis with Arimidex as well as history of abnormal colon polyp She denies new symptoms in her abdomen We discussed recent blood test results as well as CT imaging findings I went through all the different surveillance modality that is needed for the next 12 months with her She has not seen her primary care doctor for some time and ran out of her blood pressure medications She has not been monitoring her blood pressure at home She is noted to have very high blood pressure today but she is not symptomatic  REVIEW OF SYSTEMS:  Constitutional: Denies fevers, chills or abnormal weight loss Eyes: Denies blurriness of vision Ears, nose, mouth, throat, and face: Denies mucositis or sore throat Respiratory: Denies cough, dyspnea or wheezes Cardiovascular: Denies palpitation, chest discomfort or lower extremity swelling Gastrointestinal:  Denies nausea, heartburn or change in bowel habits Skin: Denies abnormal skin rashes Lymphatics: Denies new lymphadenopathy or easy bruising Neurological:Denies numbness, tingling or new  weaknesses Behavioral/Psych: Mood is stable, no new changes  All other systems were reviewed with the patient and are negative.  I have reviewed the past medical history, past surgical history, social history and family history with the patient and they are unchanged from previous note.  ALLERGIES:  is allergic to bee venom, chlorhexidine, paclitaxel, bacitracin, iodine, and other.  MEDICATIONS:  Current Outpatient Medications  Medication Sig Dispense Refill   cyanocobalamin (VITAMIN B12) 1000 MCG tablet Take 1,000 mcg by mouth daily.     amLODipine (NORVASC) 2.5 MG tablet Take 1 tablet (2.5 mg total) by mouth daily. 90 tablet 0   anastrozole (ARIMIDEX) 1 MG tablet TAKE 1 TABLET BY MOUTH EVERY DAY 90 tablet 8   Apple Cider Vinegar 600 MG CAPS Take 1,200 mg by mouth 2 (two) times daily. Takes in morning and in evening.     Ascorbic Acid (VITAMIN C) 1000 MG tablet Take 1,000 mg by mouth daily.     cetirizine (ZYRTEC) 10 MG tablet Take 10 mg by mouth daily.     Cholecalciferol (VITAMIN D3) 125 MCG (5000 UT) CAPS Take 3,000 Units by mouth daily.      Cinnamon 500 MG TABS Take 2,000 mg by mouth 2 (two) times daily.     ibuprofen (ADVIL,MOTRIN) 200 MG tablet Take 400 mg by mouth every 6 (six) hours as needed.     Inulin (FIBER CHOICE PO) Take 10 g by mouth daily.     Liniments (SALONPAS PAIN RELIEF PATCH EX) Place 1 patch onto the skin daily as needed (knee pain).     magnesium 30 MG tablet Take 30 mg by mouth 2 (two) times daily.     Melatonin 5 MG TABS Take 5 mg by mouth daily.      Probiotic Product (PROBIOTIC DAILY PO) Take by mouth.     rosuvastatin (CRESTOR) 10 MG tablet Take 1 tablet (10 mg total) by mouth daily. 90 tablet 0   Turmeric 500 MG TABS Take 500 mg by mouth daily.     Vitamin E 100 units TABS Take by mouth.     No current facility-administered medications for this visit.    SUMMARY OF ONCOLOGIC HISTORY: Oncology History Overview Note  Genetic testing came back positive  for ATM variant   Left ovarian epithelial cancer (HCC)  08/19/2017 Imaging   She had outside CT scan which showed large abdominal mass   08/27/2017 Tumor Marker   Patient's tumor was tested for the following markers: CA-125 Results of the tumor marker test revealed 904.5   09/11/2017 Pathology Results   1. 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 Soft tissue, biopsy, right medial thigh MATURE LIPOMA 3. Ovary and fallopian tube, right ENDOMETRIOMA AND SIMPLE SEROUS CYST WITH FALLOPIAN TUBE ADHESION NEGATIVE FOR MALIGNANCY 4. Omentum, resection for tumor BENIGN OMENTUM NEGATIVE FOR CARCINOMA 5. Lymph nodes, regional resection, right pelvic EIGHT BENIGN LYMPH NODES (0/8) 6. Lymph nodes, regional resection, left pelvic SEVEN  BENIGN LYMPH NODES (0/7) 7. Lymph node, biopsy, right para-aortic FOUR BENIGN LYMPH NODES (0/4) 8. Lymph node, biopsy, left para-aortic ONE BENIGN LYMPH NODE (0/1) 9. Peritoneum, biopsy, left abdominal BENIGN FIBROMUSCULAR TISSUE 10. Peritoneum, biopsy, right abdominal BENIGN FIBROMUSCULAR TISSUE WITH SEROSITIS 11. Peritoneum, biopsy, pelvic FIBROADIPOSE TISSUE WITH SEROSITIS  Specimen(s): Ovary and fallopian tube Procedure: (including lymph node sampling): salpingo-oophorectomy Primary tumor site (including laterality): Left ovary Ovarian surface involvement: Yes Ovarian capsule intact without fragmentation: intact Maximum tumor size (cm): 20.0 cm Histologic type: Endometrioid carcinoma Grade: 1 Peritoneal implants: (specify invasive or non-invasive): Negative Pelvic extension (list additional structures on separate lines and if involved): Negative Lymph nodes: number examined 20 ; number positive 0 TNM code: pT1c2, pN0, pMx FIGO Stage (based on pathologic findings, needs  clinical correlation): IC2   09/11/2017 Surgery   Preoperative Diagnosis: 1. left adnexal mass.   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    Procedure(s) Performed: 1. Exploratory laparotomy with bilateral salpingo-oophorectomy, pelvic and para-aortic lymph node dissection, omentectomy and radical debulking for ovarian cancer (CPT [phone])   Surgeon: Adolphus Birchwood, M.D.  Operative Findings:20 cm left ovarian mass.   No intraperitoneal rupture occurred;  Frozen pathology was consistent with adenocarcinoma; right tube and ovary normal in appearance; 3) normal bilateral pelvic and para-aortic lymph nodes and omentum; small and large bowel to palpation. This represented an optimal cytoreduction with no gross visible disease remaining.    10/09/2017 Tumor Marker   Patient's tumor was tested for the following markers: CA-125 Results of the tumor marker test revealed 68.7   10/15/2017 Adverse Reaction   She developed reaction to Paclitaxel.   10/15/2017 - 01/29/2018 Chemotherapy   She received carboplatin. Due to infusion reaction to Taxol with cycle 1, treatment was switched to carboplatin and Abraxane   11/03/2017 Procedure   Successful placement of a right internal jugular approach power injectable Port-A-Cath. The catheter is ready for immediate use.   11/27/2017 Tumor Marker   Patient's tumor was tested for the following markers: CA-125 Results of the tumor marker test revealed 27.7   12/12/2017 Genetic Testing   The patient had genetic testing due to a personal history of ovarian cancer and a family history of pancreatic cancer (and possibly breast cancer).  The [facility] Hereditary Cancer Panel + tumor HRD analysis was ordered from the laboratory [facility]. The [facility] gene panel offered by [employer] includes sequencing and deletion/duplication testing of the following 29 genes: APC, ATM, BARD1, BMPR1A, BRCA1, BRCA2, BRIP1, CHD1, CDK4,  CDKN2A, CHEK2, EPCAM (large rearrangement only), HOXB13, MLH1, MSH2, MSH6, MUTYH, NBN, PALB2, PMS2, PTEN, RAD51C, RAD51D, SMAD4, STK11, and TP53. Sequencing was performed for select regions of POLE and POLD1, and large rearrangement analysis was performed for select regions of GREM1.  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 [id number] (p.[id number]).  The date of this test report is 12/12/2017.   01/08/2018 Tumor Marker   Patient's tumor was tested for the following markers: CA-125 Results of the tumor marker test revealed 17.5   03/02/2018 Tumor Marker   Patient's tumor was tested for the following markers: CA-125 Results of the tumor marker test revealed 11.4   03/14/2018 Imaging   MRCP 1. No worrisome pancreatic lesions are currently present. 2. Cholelithiasis. 3. Small benign-appearing bilateral renal cysts. 4. Midline anterior abdominal wall laxity seen on coronal images, potential small hernia in this vicinity without observed complication   03/17/2018 Procedure   Successful right IJ vein Port-A-Cath  explant.   12/21/2019 Tumor Marker   Patient's tumor was tested for the following markers: CA-125 Results of the tumor marker test revealed 9.5.   06/19/2020 Tumor Marker   Patient's tumor was tested for the following markers: CA-125 Results of the tumor marker test revealed 12.5   09/01/2020 Imaging   The BMD measured at Femur Neck Left is 0.866 g/cm2 with a T-score of -1.2. This patient is considered osteopenic according to World Health Organization [facility]) criteria   12/19/2020 Tumor Marker   Patient's tumor was tested for the following markers: CA-125. Results of the tumor marker test revealed 10.6   07/04/2021 Imaging   1. No evidence of metastatic disease in the abdomen or pelvis. 2. Signs of hysterectomy and pelvic and abdominal lymphadenectomy. 3. Stable 9 mm low-attenuation focus along the margin of the RIGHT hepatic lobe, unchanged since September  of 2018. Likely benign process such as atypical hemangioma. 4. Cholelithiasis 5. Colonic diverticulosis and diverticular disease mainly in the descending and proximal sigmoid colon. 6. Aortic atherosclerosis.   Aortic Atherosclerosis (ICD10-I70.0).     07/02/2022 Imaging   Stable exam. No evidence of malignancy or other acute findings within the abdomen or pelvis. Colonic diverticulosis, without radiographic evidence of diverticulitis.   Aortic Atherosclerosis (ICD10-I70.0).     07/03/2022 Tumor Marker   Patient's tumor was tested for the following markers: CA-125. Results of the tumor marker test revealed 10.7.   12/25/2022 Tumor Marker   Patient's tumor was tested for the following markers: CA-125. Results of the tumor marker test revealed 9.5.   01/31/2023 Imaging   Osteopenia based on BMD.    07/02/2023 Tumor Marker   Patient's tumor was tested for the following markers: Ca-125. Results of the tumor marker test revealed 11.   07/08/2023 Imaging   CT ABDOMEN PELVIS W CONTRAST  Result Date: 07/08/2023 CLINICAL DATA:  Follow-up ovarian carcinoma. Previous surgery and chemotherapy. High-risk ATM gene. Positive family history of pancreatic carcinoma. * Tracking Code: BO * EXAM: CT ABDOMEN AND PELVIS WITH CONTRAST TECHNIQUE: Multidetector CT imaging of the abdomen and pelvis was performed using the standard protocol following bolus administration of intravenous contrast. RADIATION DOSE REDUCTION: This exam was performed according to the departmental dose-optimization program which includes automated exposure control, adjustment of the mA and/or kV according to patient size and/or use of iterative reconstruction technique. CONTRAST:  OMNIPAQUE IOHEXOL 300 MG/ML  SOLN COMPARISON:  07/02/2022 FINDINGS: Lower Chest: No acute findings. Hepatobiliary: 1 cm low-attenuation lesion in the lateral right hepatic lobe shows no significant change. No other liver lesions identified. Gallbladder is  unremarkable. No evidence of biliary ductal dilatation. Pancreas:  Normal appearance.  No mass or inflammatory changes. Spleen: Within normal limits in size and appearance. Adrenals/Urinary Tract: No suspicious masses identified. No evidence of ureteral calculi or hydronephrosis. Stomach/Bowel: No evidence of obstruction, inflammatory process or abnormal fluid collections. Diverticulosis is seen mainly involving the descending and sigmoid colon, however there is no evidence of diverticulitis. Vascular/Lymphatic: No pathologically enlarged lymph nodes. No acute vascular findings. Reproductive: Prior hysterectomy noted. Adnexal regions are unremarkable in appearance. Other: No evidence of peritoneal thickening or nodularity. No evidence of ascites. Musculoskeletal:  No suspicious bone lesions identified. IMPRESSION: Stable exam. No evidence of recurrent or metastatic ovarian carcinoma. No No evidence of pancreatic neoplasm. Colonic diverticulosis, without radiographic evidence of diverticulitis. Electronically Signed   By: Danae Orleans M.D.   On: 07/08/2023 10:48        PHYSICAL EXAMINATION: ECOG PERFORMANCE STATUS:  0 - Asymptomatic  Vitals:   07/15/23 0824  BP: (!) 168/94  Pulse: 77  Resp: 18  Temp: (!) 97.4 F (36.3 C)  SpO2: 99%   Filed Weights   07/15/23 0824  Weight: 215 lb 9.6 oz (97.8 kg)    GENERAL:alert, no distress and comfortable NEURO: alert & oriented x 3 with fluent speech, no focal motor/sensory deficits  LABORATORY DATA:  I have reviewed the data as listed    Component Value Date/Time   NA 140 07/01/2023 0913   NA 143 03/27/2022 0928   NA 141 11/27/2017 0818   K 4.2 07/01/2023 0913   K 3.8 11/27/2017 0818   CL 107 07/01/2023 0913   CO2 23 07/01/2023 0913   CO2 25 11/27/2017 0818   GLUCOSE 104 (H) 07/01/2023 0913   GLUCOSE 90 11/27/2017 0818   BUN 22 07/01/2023 0913   BUN 15 03/27/2022 0928   BUN 12.9 11/27/2017 0818   CREATININE 1.00 07/01/2023 0913    CREATININE 0.8 11/27/2017 0818   CALCIUM 10.1 07/01/2023 0913   CALCIUM 9.9 11/27/2017 0818   PROT 7.0 07/01/2023 0913   PROT 7.4 03/27/2022 0928   PROT 7.0 11/27/2017 0818   ALBUMIN 4.5 07/01/2023 0913   ALBUMIN 4.9 03/27/2022 0928   ALBUMIN 3.8 11/27/2017 0818   AST 17 07/01/2023 0913   AST 11 11/27/2017 0818   ALT 19 07/01/2023 0913   ALT 11 11/27/2017 0818   ALKPHOS 66 07/01/2023 0913   ALKPHOS 68 11/27/2017 0818   BILITOT 0.7 07/01/2023 0913   BILITOT 0.5 03/27/2022 0928   BILITOT 0.41 11/27/2017 0818   GFRNONAA >60 07/01/2023 0913   GFRAA 77 11/09/2020 1005    No results found for: "SPEP", "UPEP"  Lab Results  Component Value Date   WBC 6.6 07/01/2023   NEUTROABS 3.9 07/01/2023   HGB 13.9 07/01/2023   HCT 42.1 07/01/2023   MCV 85.7 07/01/2023   PLT 328 07/01/2023      Chemistry      Component Value Date/Time   NA 140 07/01/2023 0913   NA 143 03/27/2022 0928   NA 141 11/27/2017 0818   K 4.2 07/01/2023 0913   K 3.8 11/27/2017 0818   CL 107 07/01/2023 0913   CO2 23 07/01/2023 0913   CO2 25 11/27/2017 0818   BUN 22 07/01/2023 0913   BUN 15 03/27/2022 0928   BUN 12.9 11/27/2017 0818   CREATININE 1.00 07/01/2023 0913   CREATININE 0.8 11/27/2017 0818      Component Value Date/Time   CALCIUM 10.1 07/01/2023 0913   CALCIUM 9.9 11/27/2017 0818   ALKPHOS 66 07/01/2023 0913   ALKPHOS 68 11/27/2017 0818   AST 17 07/01/2023 0913   AST 11 11/27/2017 0818   ALT 19 07/01/2023 0913   ALT 11 11/27/2017 0818   BILITOT 0.7 07/01/2023 0913   BILITOT 0.5 03/27/2022 0928   BILITOT 0.41 11/27/2017 0818       RADIOGRAPHIC STUDIES: I have personally reviewed the radiological images as listed and agreed with the findings in the report. CT ABDOMEN PELVIS W CONTRAST  Result Date: 07/08/2023 CLINICAL DATA:  Follow-up ovarian carcinoma. Previous surgery and chemotherapy. High-risk ATM gene. Positive family history of pancreatic carcinoma. * Tracking Code: BO * EXAM: CT  ABDOMEN AND PELVIS WITH CONTRAST TECHNIQUE: Multidetector CT imaging of the abdomen and pelvis was performed using the standard protocol following bolus administration of intravenous contrast. RADIATION DOSE REDUCTION: This exam was performed according to the departmental dose-optimization  program which includes automated exposure control, adjustment of the mA and/or kV according to patient size and/or use of iterative reconstruction technique. CONTRAST:  OMNIPAQUE IOHEXOL 300 MG/ML  SOLN COMPARISON:  07/02/2022 FINDINGS: Lower Chest: No acute findings. Hepatobiliary: 1 cm low-attenuation lesion in the lateral right hepatic lobe shows no significant change. No other liver lesions identified. Gallbladder is unremarkable. No evidence of biliary ductal dilatation. Pancreas:  Normal appearance.  No mass or inflammatory changes. Spleen: Within normal limits in size and appearance. Adrenals/Urinary Tract: No suspicious masses identified. No evidence of ureteral calculi or hydronephrosis. Stomach/Bowel: No evidence of obstruction, inflammatory process or abnormal fluid collections. Diverticulosis is seen mainly involving the descending and sigmoid colon, however there is no evidence of diverticulitis. Vascular/Lymphatic: No pathologically enlarged lymph nodes. No acute vascular findings. Reproductive: Prior hysterectomy noted. Adnexal regions are unremarkable in appearance. Other: No evidence of peritoneal thickening or nodularity. No evidence of ascites. Musculoskeletal:  No suspicious bone lesions identified. IMPRESSION: Stable exam. No evidence of recurrent or metastatic ovarian carcinoma. No No evidence of pancreatic neoplasm. Colonic diverticulosis, without radiographic evidence of diverticulitis. Electronically Signed   By: Danae Orleans M.D.   On: 07/08/2023 10:48

## 2023-07-15 NOTE — Assessment & Plan Note (Signed)
Her last CT imaging show no evidence of disease Tumor marker is normal and she is not symptomatic Continue surveillance follow-up every few months; she will have appointment to see GYN in a few months I plan to order CT imaging once a year

## 2023-07-15 NOTE — Assessment & Plan Note (Signed)
She had grossly abnormal colonoscopy leading to partial colectomy She has not returned to gastroenterologist for a long time We discussed importance of close monitoring and she is in agreement for referral

## 2023-07-15 NOTE — Assessment & Plan Note (Signed)
The patient remained high risk for pancreatic cancer and breast cancer She will continue mammograms alternate with MRI every 6 months, her next breast imaging is due this month with screening mammogram If her mammogram is normal, I will order MRI to be done in 6 months The patient is not interested for prophylactic bilateral mastectomy She will continue Arimidex for prophylaxis

## 2023-07-22 ENCOUNTER — Encounter: Payer: Self-pay | Admitting: Internal Medicine

## 2023-07-24 ENCOUNTER — Ambulatory Visit
Admission: RE | Admit: 2023-07-24 | Discharge: 2023-07-24 | Disposition: A | Discharge status: Home / Self Care | Payer: 59 | Source: Ambulatory Visit | Attending: Hematology and Oncology | Admitting: Hematology and Oncology

## 2023-07-24 DIAGNOSIS — Z1231 Encounter for screening mammogram for malignant neoplasm of breast: Secondary | ICD-10-CM

## 2023-07-28 ENCOUNTER — Other Ambulatory Visit: Payer: Self-pay | Admitting: Hematology and Oncology

## 2023-07-28 DIAGNOSIS — Z1509 Genetic susceptibility to other malignant neoplasm: Secondary | ICD-10-CM

## 2023-08-22 ENCOUNTER — Ambulatory Visit (AMBULATORY_SURGERY_CENTER): Payer: 59 | Admitting: *Deleted

## 2023-08-22 VITALS — Ht 64.0 in | Wt 210.0 lb

## 2023-08-22 DIAGNOSIS — Z86010 Personal history of colonic polyps: Secondary | ICD-10-CM

## 2023-08-22 MED ORDER — NA SULFATE-K SULFATE-MG SULF 17.5-3.13-1.6 GM/177ML PO SOLN
1.0000 | Freq: Once | ORAL | 0 refills | Status: AC
Start: 2023-08-22 — End: 2023-08-22

## 2023-08-22 NOTE — Progress Notes (Signed)
Pt's name and DOB verified at the beginning of the pre-visit.  Pt denies any difficulty with ambulating,sitting, laying down or rolling side to side Gave both [facility] main # and MD on call # prior to instructions.  No egg or soy allergy known to patient  Pt has hx of PONV after one surgery in past. Pt denies having issues being intubated Pt has no issues moving head neck or swallowing No FH of Malignant Hyperthermia Pt is not on diet pills Pt is not on home 02  Pt is not on blood thinners  Pt denies issues with constipation  Pt is not on dialysis Pt denise any abnormal heart rhythms  Pt denies any upcoming cardiac testing Pt encouraged to use to use Singlecare or Goodrx to reduce cost  Patient's chart reviewed by Sierra Gray CNRA prior to pre-visit and patient appropriate for the [facility].  Pre-visit completed and red dot placed by patient's name on their procedure day (on provider's schedule).  . Visit by phone Pt states weight is 210 lb Instructed pt why it is important to and  to call if they have any changes in health or new medications. Directed them to the # given and on instructions.   Pt states they will.  Instructions reviewed with pt and pt states understanding. Instructed to review again prior to procedure. Pt states they will.  Instructions sent by mail with coupon and by my chart

## 2023-09-01 ENCOUNTER — Encounter: Payer: Self-pay | Admitting: Internal Medicine

## 2023-09-12 ENCOUNTER — Encounter: Payer: Self-pay | Admitting: Internal Medicine

## 2023-09-12 ENCOUNTER — Ambulatory Visit (AMBULATORY_SURGERY_CENTER): Payer: 59 | Admitting: Internal Medicine

## 2023-09-12 VITALS — BP 132/79 | HR 89 | Temp 98.4°F | Resp 12 | Ht 64.0 in | Wt 210.0 lb

## 2023-09-12 DIAGNOSIS — Z860101 Personal history of adenomatous and serrated colon polyps: Secondary | ICD-10-CM

## 2023-09-12 DIAGNOSIS — D128 Benign neoplasm of rectum: Secondary | ICD-10-CM | POA: Diagnosis not present

## 2023-09-12 DIAGNOSIS — Z860100 Personal history of colon polyps, unspecified: Secondary | ICD-10-CM

## 2023-09-12 DIAGNOSIS — Z09 Encounter for follow-up examination after completed treatment for conditions other than malignant neoplasm: Secondary | ICD-10-CM

## 2023-09-12 DIAGNOSIS — K621 Rectal polyp: Secondary | ICD-10-CM | POA: Diagnosis not present

## 2023-09-12 DIAGNOSIS — D124 Benign neoplasm of descending colon: Secondary | ICD-10-CM

## 2023-09-12 MED ORDER — SODIUM CHLORIDE 0.9 % IV SOLN: 500.0000 mL | Freq: Once | INTRAVENOUS | Status: DC

## 2023-09-12 NOTE — Progress Notes (Signed)
Called to room to assist during endoscopic procedure.  Patient ID and intended procedure confirmed with present staff. Received instructions for my participation in the procedure from the performing physician.  

## 2023-09-12 NOTE — Progress Notes (Signed)
HISTORY OF PRESENT ILLNESS:  Sierra Gray is a 62 y.o. female with a history of advanced adenoma requiring right hemicolectomy.  Now for surveillance  REVIEW OF SYSTEMS:  All non-GI ROS negative except for  Past Medical History:  Diagnosis Date   Allergy    Anemia    hx of   Anemia    Anxiety    Arthritis    oa knees   Back pain    Breast lump in female    benign   Constipation    Depression    Family history of pancreatic cancer 11/27/2017   Family history of prostate cancer 11/27/2017   Fibroids    Hyperlipidemia    Hypertension    Joint pain    Migraine    hx of    Osteoarthritis    Ovarian cancer (HCC)    Pelvic mass in female    Pelvic mass in female    PONV (postoperative nausea and vomiting)    nausea only   Sleep apnea    SOB (shortness of breath)    Swelling of both lower extremities    Vitamin D deficiency     Past Surgical History:  Procedure Laterality Date   ABDOMINAL HYSTERECTOMY  2009   Total laparoscopic hyst for fibroids   BILATERAL SALPINGECTOMY Bilateral 09/11/2017   Procedure: BILATERAL SALPINGECTOMY WITH OOPHERECTOMY;  Surgeon: Adolphus Birchwood, MD;  Location: [facility];  Service: Gynecology;  Laterality: Bilateral;   BREAST BIOPSY  12/16/2014   2 lumps in  right breast removed   BREAST EXCISIONAL BIOPSY Left    BREAST EXCISIONAL BIOPSY Right    BREAST REDUCTION SURGERY     EXCISION OF SKIN TAG N/A 09/11/2017   Procedure: EXCISION OF SKIN TAG ON THIGH;  Surgeon: Adolphus Birchwood, MD;  Location: [facility];  Service: Gynecology;  Laterality: N/A;   IR FLUORO GUIDE PORT INSERTION RIGHT  11/03/2017   IR REMOVAL TUN ACCESS W/ PORT W/O FL MOD SED  03/17/2018   IR US GUIDE VASC ACCESS RIGHT  11/03/2017   KNEE SURGERY Right    x3   LAPAROSCOPIC PARTIAL RIGHT COLECTOMY Right 09/11/2018   Procedure: LAPAROSCOPIC ASSISTED  PARTIAL RIGHT HEMI-COLECTOMY ERAS PATHWAY;  Surgeon: Luretha Murphy, MD;  Location: [facility];  Service: General;  Laterality: Right;    LAPAROTOMY Bilateral 09/11/2017   Procedure: EXPLORATORY LAPAROTOMY;  Surgeon: Adolphus Birchwood, MD;  Location: [facility];  Service: Gynecology;  Laterality: Bilateral;   LAPAROTOMY WITH STAGING N/A 09/11/2017   Procedure: PELVIC AND PARA AORTIC LYMPH NODE  DISSECTION;  Surgeon: Adolphus Birchwood, MD;  Location: [facility];  Service: Gynecology;  Laterality: N/A;   OMENTECTOMY N/A 09/11/2017   Procedure: OMENTECTOMY;  Surgeon: Adolphus Birchwood, MD;  Location: [facility];  Service: Gynecology;  Laterality: N/A;   REDUCTION MAMMAPLASTY     WISDOM TOOTH EXTRACTION      Social History Sierra Gray  reports that she has never smoked. She has never used smokeless tobacco. She reports current alcohol use. She reports that she does not use drugs.  family history includes Breast cancer in her maternal aunt; Cancer in her father; Colon cancer (age of onset: 67) in her paternal grandfather; Dementia in her maternal grandfather; Depression in her father; Diabetes in her maternal grandfather; Heart attack in her maternal grandmother; Heart disease in her maternal aunt; Hyperlipidemia in her mother; Hypertension in her father and mother; Lung cancer in her maternal aunt; Obesity in her father; Osteoporosis in her paternal grandmother;  Pancreatic cancer (age of onset: 14) in her father; Prostate cancer (age of onset: 68) in her father; Sleep apnea in her father; Thyroid disease in her mother.  Allergies  Allergen Reactions   Bee Venom Anaphylaxis   Chlorhexidine Rash    Surgical prep wash   Paclitaxel Other (See Comments)    Experienced severe back pain and flushing   Bacitracin Rash   Iodine Rash    Not IV contrast, per patient. tkv   Other Rash    Ortho prep SURGICAL GLUE--RASH, VERY BOTHERSOME, REQUIRED BENADRYL        PHYSICAL EXAMINATION: Vital signs: BP (!) 154/108   Pulse (!) 106   Temp 98.4 F (36.9 C) (Temporal)   Ht 5\' 4"  (1.626 m)   Wt 210 lb (95.3 kg)   SpO2 95%   BMI 36.05 kg/m  General:  Well-developed, well-nourished, no acute distress HEENT: Sclerae are anicteric, conjunctiva pink. Oral mucosa intact Lungs: Clear Heart: Regular Abdomen: soft, nontender, nondistended, no obvious ascites, no peritoneal signs, normal bowel sounds. No organomegaly. Extremities: No edema Psychiatric: alert and oriented x3. Cooperative     ASSESSMENT:   Advanced right-sided adenoma status post right hemicolectomy 2019  PLAN:  Surveillance colonoscopy

## 2023-09-12 NOTE — Progress Notes (Signed)
Report to PACU, RN, vss, BBS= Clear.  

## 2023-09-12 NOTE — Patient Instructions (Signed)

## 2023-09-12 NOTE — Op Note (Signed)
[facility] Patient Name: Sierra Gray Procedure Date: 09/12/2023 11:03 AM MRN: [id number] Endoscopist: Wilhemina Bonito. Marina Goodell , MD, [id number] Age: 62 Referring MD:  Date of Birth: Nov 05, 1961 Gender: Female Account #: [id number] Procedure:                Colonoscopy with cold snare x 2 Indications:              High risk colon cancer surveillance: Personal                            history of adenoma (10 mm or greater in size)                            requiring right hemicolectomy 2019 Medicines:                Monitored Anesthesia Care Procedure:                Pre-Anesthesia Assessment:                           - Prior to the procedure, a History and Physical                            was performed, and patient medications and                            allergies were reviewed. The patient's tolerance of                            previous anesthesia was also reviewed. The risks                            and benefits of the procedure and the sedation                            options and risks were discussed with the patient.                            All questions were answered, and informed consent                            was obtained. Prior Anticoagulants: The patient has                            taken no anticoagulant or antiplatelet agents. ASA                            Grade Assessment: II - A patient with mild systemic                            disease. After reviewing the risks and benefits,                            the patient was deemed in satisfactory condition to  undergo the procedure.                           After obtaining informed consent, the colonoscope                            was passed under direct vision. Throughout the                            procedure, the patient's blood pressure, pulse, and                            oxygen saturations were monitored continuously. The                            Olympus  Scope [id number] was introduced through the                            anus and advanced to the the ileocolonic                            anastomosis. The rectum and anatomosis were                            photographed. The quality of the bowel preparation                            was excellent. The colonoscopy was performed                            without difficulty. The patient tolerated the                            procedure well. The bowel preparation used was                            SUPREP via split dose instruction. Scope In: 11:23:08 AM Scope Out: 11:32:31 AM Scope Withdrawal Time: 0 hours 7 minutes 43 seconds  Total Procedure Duration: 0 hours 9 minutes 23 seconds  Findings:                 Two polyps were found in the rectum and descending                            colon. The polyps were 2 to 4 mm in size. These                            polyps were removed with a cold snare. Resection                            and retrieval were complete.                           Many diverticula were found in the transverse colon  and left colon.                           Internal hemorrhoids were found during                            retroflexion. The hemorrhoids were small.                           The exam was otherwise without abnormality on                            direct and retroflexion views. Complications:            No immediate complications. Estimated blood loss:                            None. Estimated Blood Loss:     Estimated blood loss: none. Impression:               - Two 2 to 4 mm polyps in the rectum and in the                            descending colon, removed with a cold snare.                            Resected and retrieved.                           - Diverticulosis in the transverse colon and in the                            left colon.                           - Internal hemorrhoids.                            - The examination was otherwise normal on direct                            and retroflexion views. Recommendation:           - Repeat colonoscopy in 5 years for surveillance.                           - Patient has a contact number available for                            emergencies. The signs and symptoms of potential                            delayed complications were discussed with the                            patient. Return to normal activities tomorrow.  Written discharge instructions were provided to the                            patient.                           - Resume previous diet.                           - Continue present medications.                           - Await pathology results. Wilhemina Bonito. Marina Goodell, MD 09/12/2023 11:49:14 AM This report has been signed electronically.

## 2023-09-12 NOTE — Progress Notes (Signed)
Pt's states no medical or surgical changes since previsit or office visit. 

## 2023-09-15 ENCOUNTER — Telehealth: Payer: Self-pay

## 2023-09-15 NOTE — Telephone Encounter (Signed)
  Follow up Call-     09/12/2023   10:46 AM  Call back number  Post procedure Call Back phone  # [phone]  Permission to leave phone message Yes     Patient questions:  Do you have a fever, pain , or abdominal swelling? No. Pain Score  0 *  Have you tolerated food without any problems? Yes.    Have you been able to return to your normal activities? Yes.    Do you have any questions about your discharge instructions: Diet   No. Medications  No. Follow up visit  No.  Do you have questions or concerns about your Care? No.  Actions: * If pain score is 4 or above: No action needed, pain <4.

## 2023-09-16 ENCOUNTER — Encounter: Payer: Self-pay | Admitting: Internal Medicine

## 2023-09-16 LAB — SURGICAL PATHOLOGY

## 2023-09-17 ENCOUNTER — Other Ambulatory Visit: Payer: Self-pay | Admitting: Hematology and Oncology

## 2023-09-17 DIAGNOSIS — I10 Essential (primary) hypertension: Secondary | ICD-10-CM

## 2023-12-01 ENCOUNTER — Telehealth: Payer: Self-pay

## 2023-12-01 NOTE — Telephone Encounter (Signed)
Called and left a message asking her to call radiology scheduling to schedule breast MRI on 2/19, left radiology scheduling # and ask her to call the office for questions.

## 2023-12-18 ENCOUNTER — Other Ambulatory Visit: Payer: Self-pay | Admitting: Hematology and Oncology

## 2023-12-18 DIAGNOSIS — I10 Essential (primary) hypertension: Secondary | ICD-10-CM

## 2024-01-22 ENCOUNTER — Telehealth: Payer: Self-pay

## 2024-01-22 NOTE — Telephone Encounter (Signed)
Called and scheduled appt with Dr. Bertis Ruddy on 3/4. She is aware of appt.

## 2024-01-28 ENCOUNTER — Encounter: Payer: Self-pay | Admitting: Genetic Counselor

## 2024-01-29 ENCOUNTER — Other Ambulatory Visit: Payer: 59

## 2024-02-02 ENCOUNTER — Ambulatory Visit
Admission: RE | Admit: 2024-02-02 | Discharge: 2024-02-02 | Disposition: A | Discharge status: Home / Self Care | Payer: 59 | Source: Ambulatory Visit | Attending: Hematology and Oncology | Admitting: Hematology and Oncology

## 2024-02-02 DIAGNOSIS — Z1589 Genetic susceptibility to other disease: Secondary | ICD-10-CM

## 2024-02-02 MED ORDER — GADOPICLENOL 0.5 MMOL/ML IV SOLN
10.0000 mL | Freq: Once | INTRAVENOUS | Status: AC | PRN
  Administered 2024-02-02: 10 mL via INTRAVENOUS

## 2024-02-10 ENCOUNTER — Telehealth: Payer: Self-pay | Admitting: Oncology

## 2024-02-10 ENCOUNTER — Other Ambulatory Visit: Payer: Self-pay | Admitting: Hematology and Oncology

## 2024-02-10 ENCOUNTER — Encounter: Payer: Self-pay | Admitting: Hematology and Oncology

## 2024-02-10 ENCOUNTER — Inpatient Hospital Stay: Payer: 59 | Attending: Hematology and Oncology | Admitting: Hematology and Oncology

## 2024-02-10 DIAGNOSIS — Z1501 Genetic susceptibility to malignant neoplasm of breast: Secondary | ICD-10-CM

## 2024-02-10 DIAGNOSIS — C562 Malignant neoplasm of left ovary: Secondary | ICD-10-CM

## 2024-02-10 DIAGNOSIS — Z9221 Personal history of antineoplastic chemotherapy: Secondary | ICD-10-CM | POA: Insufficient documentation

## 2024-02-10 DIAGNOSIS — Z1509 Genetic susceptibility to other malignant neoplasm: Secondary | ICD-10-CM

## 2024-02-10 DIAGNOSIS — M8580 Other specified disorders of bone density and structure, unspecified site: Secondary | ICD-10-CM | POA: Diagnosis not present

## 2024-02-10 DIAGNOSIS — Z1589 Genetic susceptibility to other disease: Secondary | ICD-10-CM

## 2024-02-10 DIAGNOSIS — Z8543 Personal history of malignant neoplasm of ovary: Secondary | ICD-10-CM | POA: Insufficient documentation

## 2024-02-10 DIAGNOSIS — Z08 Encounter for follow-up examination after completed treatment for malignant neoplasm: Secondary | ICD-10-CM | POA: Insufficient documentation

## 2024-02-10 NOTE — Progress Notes (Signed)
 HEMATOLOGY-ONCOLOGY ELECTRONIC VISIT PROGRESS NOTE  Patient Care Team: Patient, No Pcp Per as PCP - General (General Practice) Artis Delay, MD as Consulting Physician (Hematology and Oncology)  I connected with the patient via telephone conference and verified that I am speaking with the correct person using two identifiers. The patient's location is at home and I am providing care from the [facility] I discussed the limitations, risks, security and privacy concerns of performing an evaluation and management service by e-visits and the availability of in person appointments.  I also discussed with the patient that there may be a patient responsible charge related to this service. The patient expressed understanding and agreed to proceed.   ASSESSMENT & PLAN:  Left ovarian epithelial cancer (HCC) She denies signs or symptoms of cancer recurrence She has not been evaluated by gynecologist oncologist recently We will get her scheduled for pelvic exam I will see her again in 6 months with repeat imaging study and blood work  Monoallelic mutation of ATM gene The patient remained high risk for pancreatic cancer and breast cancer She will continue mammograms alternate with MRI every 6 months, her next breast imaging is due in 6 months with screening mammogram MRI of the breasts imaging study were reviewed which show no evidence of cancer The patient is not interested for prophylactic bilateral mastectomy She will continue Arimidex for prophylaxis  Osteopenia She has osteopenia noted on previous imaging She will continue calcium and vitamin D  Her next bone density scan is due in 2026  Orders Placed This Encounter  Procedures   VITAMIN D 25 Hydroxy (Vit-D Deficiency, Fractures)    Standing Status:   Future    Expiration Date:   02/09/2025    INTERVAL HISTORY: Please see below for problem oriented charting. The purpose of today's discussion is to review test results She is not  symptomatic Denies abdominal pain, changes in bowel habits of bloating She has not been evaluated by gynecologist oncologist since her last visit She is taking Arimidex and tolerated well We review MRI of the breast imaging findings and discussed future plan of care  SUMMARY OF ONCOLOGIC HISTORY: Oncology History Overview Note  Genetic testing came back positive for ATM variant   Left ovarian epithelial cancer (HCC)  08/19/2017 Imaging   She had outside CT scan which showed large abdominal mass   08/27/2017 Tumor Marker   Patient's tumor was tested for the following markers: CA-125 Results of the tumor marker test revealed 904.5   09/11/2017 Pathology Results   1. 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 Soft tissue, biopsy, right medial thigh MATURE LIPOMA 3. Ovary and fallopian tube, right ENDOMETRIOMA AND SIMPLE SEROUS CYST WITH FALLOPIAN TUBE ADHESION NEGATIVE FOR MALIGNANCY 4. Omentum, resection for tumor BENIGN OMENTUM NEGATIVE FOR CARCINOMA 5. Lymph nodes, regional resection, right pelvic EIGHT BENIGN LYMPH NODES (0/8) 6. Lymph nodes, regional resection, left pelvic SEVEN BENIGN LYMPH NODES (0/7) 7. Lymph node, biopsy, right para-aortic FOUR BENIGN LYMPH NODES (0/4) 8. Lymph node, biopsy, left para-aortic ONE BENIGN LYMPH NODE (0/1) 9. Peritoneum, biopsy, left abdominal BENIGN FIBROMUSCULAR TISSUE 10. Peritoneum, biopsy, right abdominal BENIGN FIBROMUSCULAR TISSUE WITH SEROSITIS 11. Peritoneum, biopsy, pelvic FIBROADIPOSE TISSUE WITH SEROSITIS  Specimen(s): Ovary and fallopian tube Procedure: (including lymph node sampling): salpingo-oophorectomy Primary tumor site (including laterality): Left ovary Ovarian surface involvement: Yes  Ovarian capsule intact without fragmentation:  intact Maximum tumor size (cm): 20.0 cm Histologic type: Endometrioid carcinoma Grade: 1 Peritoneal implants: (specify invasive or non-invasive): Negative Pelvic extension (list additional structures on separate lines and if involved): Negative Lymph nodes: number examined 20 ; number positive 0 TNM code: pT1c2, pN0, pMx FIGO Stage (based on pathologic findings, needs clinical correlation): IC2   09/11/2017 Surgery   Preoperative Diagnosis: 1. left adnexal mass.   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    Procedure(s) Performed: 1. Exploratory laparotomy with bilateral salpingo-oophorectomy, pelvic and para-aortic lymph node dissection, omentectomy and radical debulking for ovarian cancer (CPT [phone])   Surgeon: Adolphus Birchwood, M.D.  Operative Findings:20 cm left ovarian mass.   No intraperitoneal rupture occurred;  Frozen pathology was consistent with adenocarcinoma; right tube and ovary normal in appearance; 3) normal bilateral pelvic and para-aortic lymph nodes and omentum; small and large bowel to palpation. This represented an optimal cytoreduction with no gross visible disease remaining.    10/09/2017 Tumor Marker   Patient's tumor was tested for the following markers: CA-125 Results of the tumor marker test revealed 68.7   10/15/2017 Adverse Reaction   She developed reaction to Paclitaxel.   10/15/2017 - 01/29/2018 Chemotherapy   She received carboplatin. Due to infusion reaction to Taxol with cycle 1, treatment was switched to carboplatin and Abraxane   11/03/2017 Procedure   Successful placement of a right internal jugular approach power injectable Port-A-Cath. The catheter is ready for immediate use.   11/27/2017 Tumor Marker   Patient's tumor was tested for the following markers: CA-125 Results of the tumor marker test revealed 27.7   12/12/2017 Genetic Testing   The patient had genetic testing due to a personal history of ovarian  cancer and a family history of pancreatic cancer (and possibly breast cancer).  The [facility] Hereditary Cancer Panel + tumor HRD analysis was ordered from the laboratory [facility]. The [facility] gene panel offered by [employer] includes sequencing and deletion/duplication testing of the following 29 genes: APC, ATM, BARD1, BMPR1A, BRCA1, BRCA2, BRIP1, CHD1, CDK4, CDKN2A, CHEK2, EPCAM (large rearrangement only), HOXB13, MLH1, MSH2, MSH6, MUTYH, NBN, PALB2, PMS2, PTEN, RAD51C, RAD51D, SMAD4, STK11, and TP53. Sequencing was performed for select regions of POLE and POLD1, and large rearrangement analysis was performed for select regions of GREM1.  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 [id number] (p.[id number]).  The date of this test report is 12/12/2017.   01/08/2018 Tumor Marker   Patient's tumor was tested for the following markers: CA-125 Results of the tumor marker test revealed 17.5   03/02/2018 Tumor Marker   Patient's tumor was tested for the following markers: CA-125 Results of the tumor marker test revealed 11.4   03/14/2018 Imaging   MRCP 1. No worrisome pancreatic lesions are currently present. 2. Cholelithiasis. 3. Small benign-appearing bilateral renal cysts. 4. Midline anterior abdominal wall laxity seen on coronal images, potential small hernia in this vicinity without observed complication   03/17/2018 Procedure   Successful right IJ vein Port-A-Cath explant.   12/21/2019 Tumor Marker   Patient's tumor was tested for the following markers: CA-125 Results of the tumor marker test revealed 9.5.   06/19/2020 Tumor Marker   Patient's tumor was tested for the following markers: CA-125 Results of the tumor marker test revealed 12.5   09/01/2020 Imaging   The BMD measured at Femur Neck Left is 0.866 g/cm2 with a T-score of -1.2. This patient is considered osteopenic  according to World Health Organization [facility]) criteria   12/19/2020 Tumor Marker    Patient's tumor was tested for the following markers: CA-125. Results of the tumor marker test revealed 10.6   07/04/2021 Imaging   1. No evidence of metastatic disease in the abdomen or pelvis. 2. Signs of hysterectomy and pelvic and abdominal lymphadenectomy. 3. Stable 9 mm low-attenuation focus along the margin of the RIGHT hepatic lobe, unchanged since September of 2018. Likely benign process such as atypical hemangioma. 4. Cholelithiasis 5. Colonic diverticulosis and diverticular disease mainly in the descending and proximal sigmoid colon. 6. Aortic atherosclerosis.   Aortic Atherosclerosis (ICD10-I70.0).     07/02/2022 Imaging   Stable exam. No evidence of malignancy or other acute findings within the abdomen or pelvis. Colonic diverticulosis, without radiographic evidence of diverticulitis.   Aortic Atherosclerosis (ICD10-I70.0).     07/03/2022 Tumor Marker   Patient's tumor was tested for the following markers: CA-125. Results of the tumor marker test revealed 10.7.   12/25/2022 Tumor Marker   Patient's tumor was tested for the following markers: CA-125. Results of the tumor marker test revealed 9.5.   01/31/2023 Imaging   Osteopenia based on BMD.    07/02/2023 Tumor Marker   Patient's tumor was tested for the following markers: Ca-125. Results of the tumor marker test revealed 11.   07/08/2023 Imaging   CT ABDOMEN PELVIS W CONTRAST  Result Date: 07/08/2023 CLINICAL DATA:  Follow-up ovarian carcinoma. Previous surgery and chemotherapy. High-risk ATM gene. Positive family history of pancreatic carcinoma. * Tracking Code: BO * EXAM: CT ABDOMEN AND PELVIS WITH CONTRAST TECHNIQUE: Multidetector CT imaging of the abdomen and pelvis was performed using the standard protocol following bolus administration of intravenous contrast. RADIATION DOSE REDUCTION: This exam was performed according to the departmental dose-optimization program which includes automated exposure control,  adjustment of the mA and/or kV according to patient size and/or use of iterative reconstruction technique. CONTRAST:  OMNIPAQUE IOHEXOL 300 MG/ML  SOLN COMPARISON:  07/02/2022 FINDINGS: Lower Chest: No acute findings. Hepatobiliary: 1 cm low-attenuation lesion in the lateral right hepatic lobe shows no significant change. No other liver lesions identified. Gallbladder is unremarkable. No evidence of biliary ductal dilatation. Pancreas:  Normal appearance.  No mass or inflammatory changes. Spleen: Within normal limits in size and appearance. Adrenals/Urinary Tract: No suspicious masses identified. No evidence of ureteral calculi or hydronephrosis. Stomach/Bowel: No evidence of obstruction, inflammatory process or abnormal fluid collections. Diverticulosis is seen mainly involving the descending and sigmoid colon, however there is no evidence of diverticulitis. Vascular/Lymphatic: No pathologically enlarged lymph nodes. No acute vascular findings. Reproductive: Prior hysterectomy noted. Adnexal regions are unremarkable in appearance. Other: No evidence of peritoneal thickening or nodularity. No evidence of ascites. Musculoskeletal:  No suspicious bone lesions identified. IMPRESSION: Stable exam. No evidence of recurrent or metastatic ovarian carcinoma. No No evidence of pancreatic neoplasm. Colonic diverticulosis, without radiographic evidence of diverticulitis. Electronically Signed   By: Danae Orleans M.D.   On: 07/08/2023 10:48      02/02/2024 Imaging   MR BREAST BILATERAL W WO CONTRAST INC CAD Result Date: 02/02/2024 CLINICAL DATA:  High risk supplemental screening. Personal history of ovarian carcinoma. Patient carries the ATM gene mutation. Remote history of prior bilateral breast reduction surgery. EXAM: BILATERAL BREAST MRI WITH AND WITHOUT CONTRAST TECHNIQUE: Multiplanar, multisequence MR images of both breasts were obtained prior to and following the intravenous administration of 10 ml of Gadavist  Three-dimensional MR images were rendered by post-processing of the original  MR data on an independent workstation. The three-dimensional MR images were interpreted, and findings are reported in the following complete MRI report for this study. Three dimensional images were evaluated at the independent interpreting workstation using the DynaCAD thin client. COMPARISON:  Prior exams including previous breast MRIs, most recent dated 12/25/2022. FINDINGS: Breast composition: b. Scattered fibroglandular tissue. Background parenchymal enhancement: Mild Right breast: No new or suspicious masses. There are heterogeneously enhancing masses that are stable from prior breast MRIs and correspond to degenerating fibroadenomas on mammography. There are no suspicious areas of abnormal enhancement. Left breast: No suspicious mass or abnormal enhancement. Lymph nodes: No abnormal appearing lymph nodes. Ancillary findings:  None. IMPRESSION: 1. No evidence of malignancy of either breast. 2. 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 RECOMMENDATION: 1. Annual screening mammography. Last screening study performed on 07/24/2023. 2. Supplemental high risk screening breast MRI should be considered for patients with a greater than 20% lifetime risk for developing breast carcinoma. BI-RADS CATEGORY  2: Benign. Electronically Signed   By: Amie Portland M.D.   On: 02/02/2024 12:21        REVIEW OF SYSTEMS:   Constitutional: Denies fevers, chills or abnormal weight loss Eyes: Denies blurriness of vision Ears, nose, mouth, throat, and face: Denies mucositis or sore throat Respiratory: Denies cough, dyspnea or wheezes Cardiovascular: Denies palpitation, chest discomfort Gastrointestinal:  Denies nausea, heartburn or change in bowel habits Skin: Denies abnormal skin rashes Lymphatics: Denies new lymphadenopathy or easy bruising Neurological:Denies numbness, tingling or new weaknesses Behavioral/Psych: Mood  is stable, no new changes  Extremities: No lower extremity edema All other systems were reviewed with the patient and are negative.  I have reviewed the past medical history, past surgical history, social history and family history with the patient and they are unchanged from previous note.  ALLERGIES:  is allergic to bee venom, chlorhexidine, paclitaxel, bacitracin, iodine, and other.  MEDICATIONS:  Current Outpatient Medications  Medication Sig Dispense Refill   amLODipine (NORVASC) 2.5 MG tablet TAKE 1 TABLET(2.5 MG) BY MOUTH DAILY 90 tablet 0   anastrozole (ARIMIDEX) 1 MG tablet TAKE 1 TABLET BY MOUTH EVERY DAY 90 tablet 8   Apple Cider Vinegar 600 MG CAPS Take 1,200 mg by mouth 2 (two) times daily. Takes in morning and in evening.     Ascorbic Acid (VITAMIN C) 1000 MG tablet Take 1,000 mg by mouth daily.     cetirizine (ZYRTEC) 10 MG tablet Take 10 mg by mouth daily.     Cholecalciferol (VITAMIN D3) 125 MCG (5000 UT) CAPS Take 3,000 Units by mouth daily.      Cinnamon 500 MG TABS Take 2,000 mg by mouth 2 (two) times daily.     cyanocobalamin (VITAMIN B12) 1000 MCG tablet Take 1,000 mcg by mouth daily.     ibuprofen (ADVIL,MOTRIN) 200 MG tablet Take 400 mg by mouth every 6 (six) hours as needed.     Inulin (FIBER CHOICE PO) Take 10 g by mouth daily.     Liniments (SALONPAS PAIN RELIEF PATCH EX) Place 1 patch onto the skin daily as needed (knee pain).     magnesium 30 MG tablet Take 30 mg by mouth daily at 6 (six) AM.     Melatonin 5 MG TABS Take 5 mg by mouth daily.     Probiotic Product (PROBIOTIC DAILY PO) Take by mouth.     rosuvastatin (CRESTOR) 10 MG tablet Take 1 tablet (10 mg total) by mouth daily. (Patient not taking: Reported  on 08/22/2023) 90 tablet 0   Turmeric (QC TUMERIC COMPLEX PO) Take by mouth daily at 12 noon. Liquid form     Turmeric 500 MG TABS Take 500 mg by mouth daily. (Patient not taking: Reported on 08/22/2023)     Vitamin E 100 units TABS Take by mouth. (Patient  not taking: Reported on 08/22/2023)     No current facility-administered medications for this visit.    PHYSICAL EXAMINATION: ECOG PERFORMANCE STATUS: 0 - Asymptomatic  LABORATORY DATA:  I have reviewed the data as listed    Latest Ref Rng & Units 07/01/2023    9:13 AM 12/25/2022    8:11 AM 07/02/2022    8:39 AM  CMP  Glucose 70 - 99 mg/dL 161  096  045   BUN 8 - 23 mg/dL 22  15  14    Creatinine 0.44 - 1.00 mg/dL 4.09  8.11  9.14   Sodium 135 - 145 mmol/L 140  140  140   Potassium 3.5 - 5.1 mmol/L 4.2  4.3  4.2   Chloride 98 - 111 mmol/L 107  107  105   CO2 22 - 32 mmol/L 23  26  26    Calcium 8.9 - 10.3 mg/dL 78.2  95.6  9.8   Total Protein 6.5 - 8.1 g/dL 7.0  6.9  7.3   Total Bilirubin 0.3 - 1.2 mg/dL 0.7  0.5  0.5   Alkaline Phos 38 - 126 U/L 66  68  72   AST 15 - 41 U/L 17  18  17    ALT 0 - 44 U/L 19  22  21      Lab Results  Component Value Date   WBC 6.6 07/01/2023   HGB 13.9 07/01/2023   HCT 42.1 07/01/2023   MCV 85.7 07/01/2023   PLT 328 07/01/2023   NEUTROABS 3.9 07/01/2023     RADIOGRAPHIC STUDIES: I have personally reviewed the radiological images as listed and agreed with the findings in the report. MR BREAST BILATERAL W WO CONTRAST INC CAD Result Date: 02/02/2024 CLINICAL DATA:  High risk supplemental screening. Personal history of ovarian carcinoma. Patient carries the ATM gene mutation. Remote history of prior bilateral breast reduction surgery. EXAM: BILATERAL BREAST MRI WITH AND WITHOUT CONTRAST TECHNIQUE: Multiplanar, multisequence MR images of both breasts were obtained prior to and following the intravenous administration of 10 ml of Gadavist Three-dimensional MR images were rendered by post-processing of the original MR data on an independent workstation. The three-dimensional MR images were interpreted, and findings are reported in the following complete MRI report for this study. Three dimensional images were evaluated at the independent interpreting  workstation using the DynaCAD thin client. COMPARISON:  Prior exams including previous breast MRIs, most recent dated 12/25/2022. FINDINGS: Breast composition: b. Scattered fibroglandular tissue. Background parenchymal enhancement: Mild Right breast: No new or suspicious masses. There are heterogeneously enhancing masses that are stable from prior breast MRIs and correspond to degenerating fibroadenomas on mammography. There are no suspicious areas of abnormal enhancement. Left breast: No suspicious mass or abnormal enhancement. Lymph nodes: No abnormal appearing lymph nodes. Ancillary findings:  None. IMPRESSION: 1. No evidence of malignancy of either breast. 2. 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 RECOMMENDATION: 1. Annual screening mammography. Last screening study performed on 07/24/2023. 2. Supplemental high risk screening breast MRI should be considered for patients with a greater than 20% lifetime risk for developing breast carcinoma. BI-RADS CATEGORY  2: Benign. Electronically Signed   By: Onalee Hua  Ormond M.D.   On: 02/02/2024 12:21    I discussed the assessment and treatment plan with the patient. The patient was provided an opportunity to ask questions and all were answered. The patient agreed with the plan and demonstrated an understanding of the instructions. The patient was advised to call back or seek an in-person evaluation if the symptoms worsen or if the condition fails to improve as anticipated.    I spent 30 minutes for the appointment reviewing test results, discuss management and coordination of care.  Artis Delay, MD 02/10/2024 10:19 AM

## 2024-02-10 NOTE — Assessment & Plan Note (Addendum)
 She has osteopenia noted on previous imaging She will continue calcium and vitamin D  Her next bone density scan is due in 2026

## 2024-02-10 NOTE — Telephone Encounter (Signed)
 Schelly called back and was scheduled for an appointment with Dr. Tamela Oddi tomorrow at 2:00.

## 2024-02-10 NOTE — Telephone Encounter (Signed)
 Left a message to schedule an appointment tomorrow with Dr. Antionette Char.  Requested a return call.

## 2024-02-10 NOTE — Assessment & Plan Note (Signed)
 The patient remained high risk for pancreatic cancer and breast cancer She will continue mammograms alternate with MRI every 6 months, her next breast imaging is due in 6 months with screening mammogram MRI of the breasts imaging study were reviewed which show no evidence of cancer The patient is not interested for prophylactic bilateral mastectomy She will continue Arimidex for prophylaxis

## 2024-02-10 NOTE — Assessment & Plan Note (Signed)
 She denies signs or symptoms of cancer recurrence She has not been evaluated by gynecologist oncologist recently We will get her scheduled for pelvic exam I will see her again in 6 months with repeat imaging study and blood work

## 2024-02-11 ENCOUNTER — Inpatient Hospital Stay: Admitting: Obstetrics & Gynecology

## 2024-02-11 VITALS — BP 138/76 | HR 78 | Temp 98.5°F | Resp 19 | Wt 210.6 lb

## 2024-02-11 DIAGNOSIS — Z8543 Personal history of malignant neoplasm of ovary: Secondary | ICD-10-CM

## 2024-02-11 DIAGNOSIS — Z08 Encounter for follow-up examination after completed treatment for malignant neoplasm: Secondary | ICD-10-CM | POA: Diagnosis not present

## 2024-02-11 DIAGNOSIS — Z9221 Personal history of antineoplastic chemotherapy: Secondary | ICD-10-CM | POA: Diagnosis not present

## 2024-02-11 DIAGNOSIS — C562 Malignant neoplasm of left ovary: Secondary | ICD-10-CM

## 2024-02-11 NOTE — Patient Instructions (Signed)
 Return in 1 year ?

## 2024-02-11 NOTE — Progress Notes (Signed)
 Follow Up Note: Gyn-Onc  Sierra Gray 63 y.o. female  CC: Follow up visit   HPI: The oncology history was reviewed.  Interval History: She denies abdominal distention, pain, weight loss or change in her bowel habits. Seen in follow-up by Dr. Bertis Ruddy yesterday--negative sx review.    Review of Systems  Review of Systems  Constitutional:  Negative for malaise/fatigue and weight loss.  Respiratory:  Negative for shortness of breath and wheezing.   Cardiovascular:  Negative for chest pain and leg swelling.  Gastrointestinal:  Negative for abdominal pain, blood in stool, constipation, nausea and vomiting.  Genitourinary:  Negative for dysuria, frequency, hematuria and urgency.  Musculoskeletal:  Negative for joint pain and myalgias.  Neurological:  Negative for weakness.  Psychiatric/Behavioral:  Negative for depression. The patient does not have insomnia.    Current medications, allergy, social history, past surgical history, past medical history, family history were all reviewed.    Vitals:  Blood pressure (!) 148/96, pulse 78, temperature 98.5 F (36.9 C), temperature source Oral, resp. rate 19, weight 210 lb 9.6 oz (95.5 kg), SpO2 98%.  Physical Exam:  Physical Exam Exam conducted with a chaperone present.  Constitutional:      General: She is not in acute distress. Cardiovascular:     Rate and Rhythm: Normal rate and regular rhythm.  Pulmonary:     Effort: Pulmonary effort is normal.     Breath sounds: Normal breath sounds. No wheezing or rhonchi.  Abdominal:     Palpations: Abdomen is soft.     Tenderness: There is no abdominal tenderness. There is no right CVA tenderness or left CVA tenderness.     Hernia: No hernia is present.  Genitourinary:    General: Normal vulva.     Urethra: No urethral lesion.     Vagina: No lesions. No bleeding Musculoskeletal:     Cervical back: Neck supple.     Right lower leg: No edema.     Left lower leg: No edema.   Lymphadenopathy:     Upper Body:     Right upper body: No supraclavicular adenopathy.     Left upper body: No supraclavicular adenopathy.     Lower Body: No right inguinal adenopathy. No left inguinal adenopathy.  Skin:    Findings: No rash.  Neurological:     Mental Status: She is oriented to person, place, and time.   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 S/p adjuvant chemotherapy with 6 cycles carboplatin and paclitaxel completed 01/29/18.  Negative symptom review, normal exam.  No evidence of recurrence   -She is scheduled for follow up with Dr. Artis Delay in 6 mos and our office in 1 yr.    Antionette Char, MD

## 2024-02-11 NOTE — Assessment & Plan Note (Addendum)
 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 S/p adjuvant chemotherapy with 6 cycles carboplatin and paclitaxel completed 01/29/18.  Negative symptom review, normal exam.  No evidence of recurrence   -She is scheduled for follow up with Dr. Artis Delay in 6 mos and our office in 1 yr.

## 2024-02-13 ENCOUNTER — Encounter: Payer: Self-pay | Admitting: Obstetrics & Gynecology

## 2024-03-20 ENCOUNTER — Other Ambulatory Visit: Payer: Self-pay | Admitting: Hematology and Oncology

## 2024-03-20 DIAGNOSIS — I10 Essential (primary) hypertension: Secondary | ICD-10-CM

## 2024-06-18 ENCOUNTER — Other Ambulatory Visit: Payer: Self-pay | Admitting: Hematology and Oncology

## 2024-06-18 ENCOUNTER — Telehealth: Payer: Self-pay

## 2024-06-18 DIAGNOSIS — I10 Essential (primary) hypertension: Secondary | ICD-10-CM

## 2024-06-18 NOTE — Telephone Encounter (Signed)
 Reached out to patient to determine if she had been able to establish care with a PCP. Patient reports that she has not been able to do so yet, but will work on getting that done.  Patient sent myChart message, per her request, with number for the Patient Access Team, who can assist her in establishing care with a PCP.  Patient aware that it is Dr. Buzz hope that once she has established care with a PCP, they would assist in managing her hypertension and be responsible for future amlodipine  refills. Dr. Lonn will refill request one additional time, but will then defer all future refills to patient's PCP. Patient verbalized an understanding of the information and confirmed that she will call to get established with a provider.

## 2024-07-07 ENCOUNTER — Other Ambulatory Visit: Payer: Self-pay | Admitting: Hematology and Oncology

## 2024-07-07 DIAGNOSIS — Z1231 Encounter for screening mammogram for malignant neoplasm of breast: Secondary | ICD-10-CM

## 2024-07-09 ENCOUNTER — Other Ambulatory Visit

## 2024-07-13 ENCOUNTER — Telehealth: Payer: Self-pay

## 2024-07-13 ENCOUNTER — Ambulatory Visit

## 2024-07-13 DIAGNOSIS — Z1501 Genetic susceptibility to malignant neoplasm of breast: Secondary | ICD-10-CM | POA: Diagnosis not present

## 2024-07-13 DIAGNOSIS — Z1509 Genetic susceptibility to other malignant neoplasm: Secondary | ICD-10-CM

## 2024-07-13 DIAGNOSIS — C562 Malignant neoplasm of left ovary: Secondary | ICD-10-CM | POA: Diagnosis not present

## 2024-07-13 MED ORDER — IOHEXOL 9 MG/ML PO SOLN
500.0000 mL | ORAL | Status: AC
  Administered 2024-07-13 (×2): 500 mL via ORAL

## 2024-07-13 MED ORDER — IOHEXOL 300 MG/ML  SOLN
100.0000 mL | Freq: Once | INTRAMUSCULAR | Status: AC | PRN
  Administered 2024-07-13: 100 mL via INTRAVENOUS

## 2024-07-13 NOTE — Telephone Encounter (Signed)
 Pt returned call and was offered appt for 9/16 at 1140. She is agreeable to this time. Apt changed.

## 2024-07-13 NOTE — Telephone Encounter (Signed)
 Attempted to call pt to advise we would need to move her appt 9/16 at 0840 to a later appt that same day. LVM for call back.

## 2024-07-15 ENCOUNTER — Ambulatory Visit: Payer: 59 | Admitting: Hematology and Oncology

## 2024-07-19 ENCOUNTER — Telehealth: Payer: Self-pay

## 2024-07-19 NOTE — Telephone Encounter (Signed)
 Patient had CT scan done earlier than the expected date of first week of September. Patient to be rescheduled to review CT scan and mammogram results for the week of September 1st.  Attempted to contact patient to schedule lab and MD visit with Dr. Lonn to review.  Unable to leave voicemail on primary phone number at this time. MyChart message sent to patient requesting that patient call office to get these appointments scheduled.

## 2024-07-27 ENCOUNTER — Telehealth: Payer: Self-pay | Admitting: Oncology

## 2024-07-27 NOTE — Telephone Encounter (Signed)
 Called [facility] and rescheduled her lab on 08/17/24 to 11:30. Also rescheduled the appointment with Dr. Lonn to 12:00 on 08/17/24.

## 2024-08-05 ENCOUNTER — Ambulatory Visit
Admission: RE | Admit: 2024-08-05 | Discharge: 2024-08-05 | Disposition: A | Discharge status: Home / Self Care | Source: Ambulatory Visit | Attending: Hematology and Oncology

## 2024-08-05 DIAGNOSIS — Z1231 Encounter for screening mammogram for malignant neoplasm of breast: Secondary | ICD-10-CM

## 2024-08-17 ENCOUNTER — Inpatient Hospital Stay: Attending: Hematology and Oncology

## 2024-08-17 ENCOUNTER — Inpatient Hospital Stay: Admitting: Hematology and Oncology

## 2024-08-17 ENCOUNTER — Other Ambulatory Visit

## 2024-08-17 ENCOUNTER — Encounter: Payer: Self-pay | Admitting: Hematology and Oncology

## 2024-08-17 VITALS — BP 157/99 | HR 79 | Temp 97.8°F | Resp 18 | Ht 64.0 in | Wt 221.4 lb

## 2024-08-17 DIAGNOSIS — Z800 Family history of malignant neoplasm of digestive organs: Secondary | ICD-10-CM | POA: Diagnosis not present

## 2024-08-17 DIAGNOSIS — Z1501 Genetic susceptibility to malignant neoplasm of breast: Secondary | ICD-10-CM | POA: Diagnosis not present

## 2024-08-17 DIAGNOSIS — Z79811 Long term (current) use of aromatase inhibitors: Secondary | ICD-10-CM | POA: Insufficient documentation

## 2024-08-17 DIAGNOSIS — Z1509 Genetic susceptibility to other malignant neoplasm: Secondary | ICD-10-CM | POA: Insufficient documentation

## 2024-08-17 DIAGNOSIS — C562 Malignant neoplasm of left ovary: Secondary | ICD-10-CM

## 2024-08-17 DIAGNOSIS — Z1589 Genetic susceptibility to other disease: Secondary | ICD-10-CM

## 2024-08-17 DIAGNOSIS — Z1502 Genetic susceptibility to malignant neoplasm of ovary: Secondary | ICD-10-CM | POA: Insufficient documentation

## 2024-08-17 DIAGNOSIS — Z8543 Personal history of malignant neoplasm of ovary: Secondary | ICD-10-CM | POA: Diagnosis present

## 2024-08-17 DIAGNOSIS — M8580 Other specified disorders of bone density and structure, unspecified site: Secondary | ICD-10-CM

## 2024-08-17 LAB — CBC WITH DIFFERENTIAL/PLATELET
Abs Immature Granulocytes: 0.02 K/uL (ref 0.00–0.07)
Basophils Absolute: 0.1 K/uL (ref 0.0–0.1)
Basophils Relative: 1 %
Eosinophils Absolute: 0.2 K/uL (ref 0.0–0.5)
Eosinophils Relative: 3 %
HCT: 41.7 % (ref 36.0–46.0)
Hemoglobin: 13.7 g/dL (ref 12.0–15.0)
Immature Granulocytes: 0 %
Lymphocytes Relative: 21 %
Lymphs Abs: 1.9 K/uL (ref 0.7–4.0)
MCH: 28.5 pg (ref 26.0–34.0)
MCHC: 32.9 g/dL (ref 30.0–36.0)
MCV: 86.7 fL (ref 80.0–100.0)
Monocytes Absolute: 0.8 K/uL (ref 0.1–1.0)
Monocytes Relative: 9 %
Neutro Abs: 5.8 K/uL (ref 1.7–7.7)
Neutrophils Relative %: 66 %
Platelets: 326 K/uL (ref 150–400)
RBC: 4.81 MIL/uL (ref 3.87–5.11)
RDW: 14.4 % (ref 11.5–15.5)
WBC: 8.8 K/uL (ref 4.0–10.5)
nRBC: 0 % (ref 0.0–0.2)

## 2024-08-17 LAB — COMPREHENSIVE METABOLIC PANEL WITH GFR
ALT: 25 U/L (ref 0–44)
AST: 20 U/L (ref 15–41)
Albumin: 4.4 g/dL (ref 3.5–5.0)
Alkaline Phosphatase: 79 U/L (ref 38–126)
Anion gap: 7 (ref 5–15)
BUN: 14 mg/dL (ref 8–23)
CO2: 27 mmol/L (ref 22–32)
Calcium: 9.8 mg/dL (ref 8.9–10.3)
Chloride: 105 mmol/L (ref 98–111)
Creatinine, Ser: 0.82 mg/dL (ref 0.44–1.00)
GFR, Estimated: >60 mL/min (ref >60)
Glucose, Bld: 96 mg/dL (ref 70–99)
Potassium: 4.2 mmol/L (ref 3.5–5.1)
Sodium: 139 mmol/L (ref 135–145)
Total Bilirubin: 0.5 mg/dL (ref 0.0–1.2)
Total Protein: 7.1 g/dL (ref 6.5–8.1)

## 2024-08-17 LAB — VITAMIN D 25 HYDROXY (VIT D DEFICIENCY, FRACTURES): Vit D, 25-Hydroxy: 63.03 ng/mL (ref 30–100)

## 2024-08-17 NOTE — Assessment & Plan Note (Addendum)
 The patient remained high risk for pancreatic cancer and breast cancer She will continue mammograms alternate with MRI every 6 months  Recent mammogram from August 2025 showed no evidence of disease The patient is not interested for prophylactic bilateral mastectomy She will continue Arimidex  for prophylaxis  Her next breast imaging will be MRI in February 2026 She will also be due for bone density scan in February 2026 Her next MRI of the abdomen to screen for pancreatic cancer would be due in August 2026

## 2024-08-17 NOTE — Assessment & Plan Note (Addendum)
 She was diagnosed with ovarian cancer after presentation with abdominal mass in 2018 She underwent optimal surgical debulking followed by adjuvant chemotherapy, completed by February 2019 Final pathology: Endometrioid adenocarcinoma, genetics positive for ATM variant  CT imaging from August 2025 showed no evidence of recurrent disease The patient is considered a long-term cancer survivor for ovarian cancer diagnosis standpoint She does not need further imaging and follow-up in this regard

## 2024-08-17 NOTE — Assessment & Plan Note (Addendum)
 Due to ATM gene mutation, she is at risk of pancreatic cancer I recommend switching screening modality to MRI in the future to reduce risk of radiation from CT imaging Her next imaging will be due in August 2026

## 2024-08-17 NOTE — Progress Notes (Signed)
 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 She underwent optimal surgical debulking followed by adjuvant chemotherapy, completed by February 2019 Final pathology: Endometrioid adenocarcinoma, genetics positive for ATM variant  CT imaging from August 2025 showed no evidence of recurrent disease The patient is considered a long-term cancer survivor for ovarian cancer diagnosis standpoint She does not need further imaging and follow-up in this regard Monoallelic mutation of ATM gene The patient remained high risk for pancreatic cancer and breast cancer She will continue mammograms alternate with MRI every 6 months  Recent mammogram from August 2025 showed no evidence of disease The patient is not interested for prophylactic bilateral mastectomy She will continue Arimidex  for prophylaxis  Her next breast imaging will be MRI in February 2026 She will also be due for bone density scan in February 2026 Her next MRI of the abdomen to screen for pancreatic cancer would be due in August 2026 Family history of pancreatic cancer Due to ATM gene mutation, she is at risk of pancreatic cancer I recommend switching screening modality to MRI in the future to reduce risk of radiation from CT imaging Her next imaging will be due in August 2026  Orders Placed This Encounter  Procedures   DG Bone Density    Standing Status:   Future    Expected Date:   01/31/2025    Expiration Date:   08/17/2025    Reason for Exam (SYMPTOM  OR DIAGNOSIS REQUIRED):   osteopenia    Preferred imaging location?:   [facility]   MR Breast Bilateral Wo Contrast    Standing Status:   Future    Expected Date:   01/31/2025     Expiration Date:   08/17/2025    What is the patient's sedation requirement?:   No Sedation    Does the patient have a pacemaker or implanted devices?:   No    Preferred imaging location?:   [facility] (table limit 500lbs)     Hicks Lonn, MD  INTERVAL HISTORY: she returns for surveillance follow-up for history of ovarian cancer and ongoing prophylactic treatment for high risk breast cancer She tolerated Arimidex  well She has no symptoms We discussed recent test results  PHYSICAL EXAMINATION: ECOG PERFORMANCE STATUS: 0 - Asymptomatic  Vitals:   08/17/24 1209  BP: (!) 157/99  Pulse: 79  Resp: 18  Temp: 97.8 F (36.6 C)  SpO2: 99%   Filed Weights   08/17/24 1209  Weight: 221 lb 6.4 oz (100.4 kg)    Relevant data reviewed during this visit included CBC, CMP, CT imaging from August 2025

## 2024-08-18 LAB — CA 125: Cancer Antigen (CA) 125: 11.9 U/mL (ref 0.0–38.1)

## 2024-08-24 ENCOUNTER — Ambulatory Visit: Admitting: Hematology and Oncology

## 2024-09-18 ENCOUNTER — Other Ambulatory Visit: Payer: Self-pay | Admitting: Hematology and Oncology

## 2024-09-18 DIAGNOSIS — I10 Essential (primary) hypertension: Secondary | ICD-10-CM

## 2024-11-22 ENCOUNTER — Telehealth: Payer: Self-pay

## 2024-11-22 NOTE — Telephone Encounter (Signed)
 Called Sierra Gray and moved appt with Sierra Gray to 9 am on 3/3. Sierra Gray will call to schedule MRI after the new year due to having new insurance. Give radiology scheduling #.

## 2024-12-10 ENCOUNTER — Other Ambulatory Visit (HOSPITAL_BASED_OUTPATIENT_CLINIC_OR_DEPARTMENT_OTHER)

## 2024-12-17 ENCOUNTER — Telehealth: Payer: Self-pay

## 2024-12-17 ENCOUNTER — Other Ambulatory Visit: Payer: Self-pay | Admitting: Hematology and Oncology

## 2024-12-17 DIAGNOSIS — I10 Essential (primary) hypertension: Secondary | ICD-10-CM

## 2024-12-17 NOTE — Telephone Encounter (Signed)
 Called and left a message from Dr. Lonn, BP medication declined from Dr. Lonn.  Ask Sierra Gray to call the office back if help needed with getting PCP. Left a reminder to call radiology scheduling to schedule breast MRI on 2/24.

## 2024-12-28 ENCOUNTER — Other Ambulatory Visit: Payer: Self-pay

## 2024-12-28 ENCOUNTER — Telehealth: Payer: Self-pay

## 2024-12-28 MED ORDER — AMLODIPINE BESYLATE 2.5 MG PO TABS: 2.5000 mg | ORAL_TABLET | Freq: Every day | ORAL | 0 refills | Status: AC

## 2024-12-28 NOTE — Telephone Encounter (Signed)
 Returned her call and Rx for Norvasc  sent until she establishes with PCP in March. Reviewed appts and she is aware.

## 2025-02-01 ENCOUNTER — Ambulatory Visit: Admitting: Hematology and Oncology

## 2025-02-01 ENCOUNTER — Other Ambulatory Visit

## 2025-02-08 ENCOUNTER — Other Ambulatory Visit (HOSPITAL_COMMUNITY)

## 2025-02-08 ENCOUNTER — Inpatient Hospital Stay: Admitting: Hematology and Oncology

## 2025-02-08 ENCOUNTER — Inpatient Hospital Stay

## 2025-02-15 ENCOUNTER — Inpatient Hospital Stay: Admitting: Hematology and Oncology

## 2025-03-04 ENCOUNTER — Ambulatory Visit: Admitting: Family Medicine

## 2025-04-27 ENCOUNTER — Other Ambulatory Visit (HOSPITAL_BASED_OUTPATIENT_CLINIC_OR_DEPARTMENT_OTHER): Payer: Self-pay
